# Patient Record
Sex: Female | Born: 1970 | Race: Black or African American | Hispanic: No | Marital: Single | Smoking: Current some day smoker
Health system: Southern US, Community
[De-identification: ages and names within clinical notes are randomized; demographics above are authoritative.]

## PROBLEM LIST (undated history)

## (undated) DIAGNOSIS — I10 Essential (primary) hypertension: Secondary | ICD-10-CM

## (undated) DIAGNOSIS — E785 Hyperlipidemia, unspecified: Secondary | ICD-10-CM

## (undated) HISTORY — DX: Hyperlipidemia, unspecified: E78.5

---

## 1997-12-13 ENCOUNTER — Inpatient Hospital Stay (HOSPITAL_COMMUNITY): Admission: AD | Admit: 1997-12-13 | Discharge: 1997-12-16 | Payer: Self-pay | Admitting: Obstetrics

## 1998-02-03 ENCOUNTER — Observation Stay (HOSPITAL_COMMUNITY): Admission: AD | Admit: 1998-02-03 | Discharge: 1998-02-03 | Payer: Self-pay | Admitting: *Deleted

## 1998-02-06 ENCOUNTER — Inpatient Hospital Stay (HOSPITAL_COMMUNITY): Admission: AD | Admit: 1998-02-06 | Discharge: 1998-02-08 | Payer: Self-pay | Admitting: Obstetrics

## 1998-03-25 ENCOUNTER — Encounter: Admission: RE | Admit: 1998-03-25 | Discharge: 1998-03-25 | Payer: Self-pay | Admitting: Obstetrics & Gynecology

## 1998-04-04 ENCOUNTER — Ambulatory Visit (HOSPITAL_COMMUNITY): Admission: RE | Admit: 1998-04-04 | Discharge: 1998-04-04 | Payer: Self-pay | Admitting: Obstetrics & Gynecology

## 1998-04-25 ENCOUNTER — Ambulatory Visit (HOSPITAL_COMMUNITY): Admission: RE | Admit: 1998-04-25 | Discharge: 1998-04-25 | Payer: Self-pay | Admitting: Obstetrics & Gynecology

## 1998-11-18 ENCOUNTER — Emergency Department (HOSPITAL_COMMUNITY): Admission: EM | Admit: 1998-11-18 | Discharge: 1998-11-18 | Payer: Self-pay | Admitting: Emergency Medicine

## 1998-12-21 ENCOUNTER — Emergency Department (HOSPITAL_COMMUNITY): Admission: EM | Admit: 1998-12-21 | Discharge: 1998-12-21 | Payer: Self-pay | Admitting: Emergency Medicine

## 1998-12-21 ENCOUNTER — Encounter: Payer: Self-pay | Admitting: Emergency Medicine

## 2003-01-11 ENCOUNTER — Emergency Department (HOSPITAL_COMMUNITY): Admission: EM | Admit: 2003-01-11 | Discharge: 2003-01-11 | Payer: Self-pay | Admitting: Emergency Medicine

## 2003-02-03 ENCOUNTER — Inpatient Hospital Stay (HOSPITAL_COMMUNITY): Admission: EM | Admit: 2003-02-03 | Discharge: 2003-02-05 | Payer: Self-pay

## 2004-04-19 ENCOUNTER — Emergency Department (HOSPITAL_COMMUNITY): Admission: EM | Admit: 2004-04-19 | Discharge: 2004-04-19 | Payer: Self-pay | Admitting: Emergency Medicine

## 2004-06-17 ENCOUNTER — Emergency Department (HOSPITAL_COMMUNITY): Admission: EM | Admit: 2004-06-17 | Discharge: 2004-06-17 | Payer: Self-pay | Admitting: *Deleted

## 2012-08-25 ENCOUNTER — Emergency Department (HOSPITAL_COMMUNITY)
Admission: EM | Admit: 2012-08-25 | Discharge: 2012-08-25 | Disposition: A | Discharge status: Home / Self Care | Payer: Self-pay | Attending: Emergency Medicine | Admitting: Emergency Medicine

## 2012-08-25 ENCOUNTER — Encounter (HOSPITAL_COMMUNITY): Payer: Self-pay | Admitting: Nurse Practitioner

## 2012-08-25 DIAGNOSIS — M545 Low back pain, unspecified: Secondary | ICD-10-CM | POA: Insufficient documentation

## 2012-08-25 DIAGNOSIS — F17200 Nicotine dependence, unspecified, uncomplicated: Secondary | ICD-10-CM | POA: Insufficient documentation

## 2012-08-25 MED ORDER — IBUPROFEN 600 MG PO TABS: 600.0000 mg | ORAL_TABLET | Freq: Four times a day (QID) | ORAL | Status: DC | PRN

## 2012-08-25 MED ORDER — OXYCODONE-ACETAMINOPHEN 7.5-500 MG PO TABS: 1.0000 | ORAL_TABLET | ORAL | Status: DC | PRN

## 2012-08-25 MED ORDER — CYCLOBENZAPRINE HCL 10 MG PO TABS: 10.0000 mg | ORAL_TABLET | Freq: Two times a day (BID) | ORAL | Status: DC | PRN

## 2012-08-25 NOTE — ED Provider Notes (Signed)
History   This chart was scribed for Nelia Shi, MD, by Frederik Pear, ER scribe. The patient was seen in room TR07C/TR07C and the patient's care was started at 0854.    CSN: [id number]  Arrival date & time 08/25/12  3086   First MD Initiated Contact with Patient 08/25/12 [phone]      Chief Complaint  Patient presents with  . Back Pain    (Consider location/radiation/quality/duration/timing/severity/associated sxs/prior treatment) HPI  Roberta Calderon is a 41 y.o. female who presents to the Emergency Department complaining of constant, moderate lower back pain that is aggravated by movement and began several days ago. She denies any dysuria. She reports that she took ibuprofen at home with no relief. She states that she was in the ED over the summer with similar pain and was diagnosed with a pinched nerve.   No past medical history on file.  No past surgical history on file.  No family history on file.  History  Substance Use Topics  . Smoking status: Current Every Day Smoker  . Smokeless tobacco: Not on file  . Alcohol Use: No    OB History    Grav Para Term Preterm Abortions TAB SAB Ect Mult Living                  Review of Systems A complete 10 system review of systems was obtained and all systems are negative except as noted in the HPI and PMH.  Allergies  Review of patient's allergies indicates no known allergies.  Home Medications   Current Outpatient Rx  Name  Route  Sig  Dispense  Refill  . CYCLOBENZAPRINE HCL 10 MG PO TABS   Oral   Take 1 tablet (10 mg total) by mouth 2 (two) times daily as needed for muscle spasms.   20 tablet   0   . IBUPROFEN 600 MG PO TABS   Oral   Take 1 tablet (600 mg total) by mouth every 6 (six) hours as needed for pain.   30 tablet   0   . OXYCODONE-ACETAMINOPHEN 7.5-500 MG PO TABS   Oral   Take 1 tablet by mouth every 4 (four) hours as needed for pain.   30 tablet   0     BP 179/94  Pulse 75  Temp 97.8 F  (36.6 C) (Oral)  Resp 18  SpO2 100%  Physical Exam  Nursing note and vitals reviewed. Constitutional: She is oriented to person, place, and time. She appears well-developed and well-nourished. No distress.  HENT:  Head: Normocephalic and atraumatic.  Eyes: Pupils are equal, round, and reactive to light.  Neck: Normal range of motion.  Cardiovascular: Normal rate and intact distal pulses.   Pulmonary/Chest: No respiratory distress.  Abdominal: Normal appearance. She exhibits no distension.  Musculoskeletal: She exhibits tenderness (As indicated).       Lumbar back: She exhibits decreased range of motion.       Back:  Neurological: She is alert and oriented to person, place, and time. No cranial nerve deficit.  Skin: Skin is warm and dry. No rash noted.  Psychiatric: She has a normal mood and affect. Her behavior is normal.    ED Course  Procedures (including critical care time)  DIAGNOSTIC STUDIES: Oxygen Saturation is 100% on room air, normal by my interpretation.    COORDINATION OF CARE:  08:55- Discussed planned course of treatment with the patient, including pain medication, who is agreeable at this time.  Labs Reviewed - No data to display No results found.   1. Low back pain       MDM  I personally performed the services described in this documentation, which was scribed in my presence. The recorded information has been reviewed and considered.       Nelia Shi, MD 08/25/12 [phone]

## 2012-08-25 NOTE — ED Notes (Signed)
C/o lower back pain for past several days. Reports a history of back pain. States 'Feels like a pinched nerve." no new injuries. Ambulatory, mae

## 2012-08-25 NOTE — ED Notes (Signed)
Low mid back pain, non-radiating, denies numbness. Pain comes and goes.Hx of similar pain in the past. Pain worse with movement.

## 2012-10-08 ENCOUNTER — Emergency Department (HOSPITAL_COMMUNITY)
Admission: EM | Admit: 2012-10-08 | Discharge: 2012-10-08 | Disposition: A | Discharge status: Home / Self Care | Payer: Self-pay | Attending: Emergency Medicine | Admitting: Emergency Medicine

## 2012-10-08 ENCOUNTER — Encounter (HOSPITAL_COMMUNITY): Payer: Self-pay | Admitting: *Deleted

## 2012-10-08 DIAGNOSIS — F17200 Nicotine dependence, unspecified, uncomplicated: Secondary | ICD-10-CM | POA: Insufficient documentation

## 2012-10-08 DIAGNOSIS — IMO0002 Reserved for concepts with insufficient information to code with codable children: Secondary | ICD-10-CM | POA: Insufficient documentation

## 2012-10-08 DIAGNOSIS — M62838 Other muscle spasm: Secondary | ICD-10-CM | POA: Insufficient documentation

## 2012-10-08 DIAGNOSIS — M542 Cervicalgia: Secondary | ICD-10-CM | POA: Insufficient documentation

## 2012-10-08 DIAGNOSIS — F911 Conduct disorder, childhood-onset type: Secondary | ICD-10-CM | POA: Insufficient documentation

## 2012-10-08 DIAGNOSIS — Z87828 Personal history of other (healed) physical injury and trauma: Secondary | ICD-10-CM | POA: Insufficient documentation

## 2012-10-08 MED ORDER — IBUPROFEN 800 MG PO TABS: 800.0000 mg | ORAL_TABLET | Freq: Three times a day (TID) | ORAL | Status: DC

## 2012-10-08 MED ORDER — CYCLOBENZAPRINE HCL 10 MG PO TABS
10.0000 mg | ORAL_TABLET | Freq: Once | ORAL | Status: AC
  Administered 2012-10-08: 10 mg via ORAL
  Filled 2012-10-08: qty 1

## 2012-10-08 MED ORDER — CYCLOBENZAPRINE HCL 10 MG PO TABS: 10.0000 mg | ORAL_TABLET | Freq: Two times a day (BID) | ORAL | Status: DC | PRN

## 2012-10-08 NOTE — ED Notes (Signed)
Pt refused to sign discharge paper work and refused prescriptions

## 2012-10-08 NOTE — ED Provider Notes (Signed)
History     CSN: [id number]  Arrival date & time 10/08/12  1216   First MD Initiated Contact with Patient 10/08/12 1229      Chief Complaint  Patient presents with  . Neck Pain    (Consider location/radiation/quality/duration/timing/severity/associated sxs/prior treatment) HPI Comments: 42 year old female presents emergency department complaining of left-sided neck pain and pain on her scapular region beginning last night worsening into today. States she had similar symptoms one year ago after staining a fall, however this time she denies any new injury. Last year she was given pain medication which greatly improved her pain. No new injury or trauma. Describes pain as nonradiating, sharp and throbbing , rated 10 out of 10. Pain worse with turning her neck, relieved by laying down. Tylenol provides no relief. Denies pain, numbness or tingling shooting down her extremities. No fever or chills. Initially when asking patient his questions, she refused to answer because "too many people are asking her the same questions". I explained to her that I am the one providing her care today, and is general practice for each person to ask questions that they have.  Patient is a 42 y.o. female presenting with neck pain. The history is provided by the patient.  Neck Pain Associated symptoms: no fever and no numbness     History reviewed. No pertinent past medical history.  History reviewed. No pertinent past surgical history.  No family history on file.  History  Substance Use Topics  . Smoking status: Current Every Day Smoker -- 0.25 packs/day    Types: Cigarettes  . Smokeless tobacco: Not on file  . Alcohol Use: No    OB History   Grav Para Term Preterm Abortions TAB SAB Ect Mult Living                  Review of Systems  Constitutional: Negative for fever and chills.  HENT: Positive for neck pain. Negative for neck stiffness.   Neurological: Negative for numbness.  All other systems  reviewed and are negative.    Allergies  Review of patient's allergies indicates no known allergies.  Home Medications  No current outpatient prescriptions on file.  BP 142/91  Pulse 110  Temp(Src) 98.1 F (36.7 C) (Oral)  Resp 18  SpO2 100%  LMP 09/21/2012  Physical Exam  Nursing note and vitals reviewed. Constitutional: She is oriented to person, place, and time. She appears well-developed and well-nourished. No distress.  HENT:  Head: Normocephalic and atraumatic.  Mouth/Throat: Oropharynx is clear and moist.  Eyes: Conjunctivae and EOM are normal. Pupils are equal, round, and reactive to light.  Neck: Trachea normal. Neck supple. Spinous process tenderness and muscular tenderness present. No rigidity. No edema and no erythema present. No Brudzinski's sign and no Kernig's sign noted.  Patient refuses to perform ROM due to pain.  Cardiovascular: Normal rate, regular rhythm, normal heart sounds and intact distal pulses.   Pulmonary/Chest: Effort normal and breath sounds normal.  Musculoskeletal: She exhibits no edema.       Arms: Tenderness to the lightest palpation of the posterior aspects of her shoulder and generalized the left side of her neck. Patient winces before even palpating and pushes arm away immediately.  Neurological: She is alert and oriented to person, place, and time. No sensory deficit.  Patient refuses to allow me to assess her strength  Skin: Skin is warm and dry.  Psychiatric: Her affect is angry. She is agitated.    ED Course  Procedures (  including critical care time)  Labs Reviewed - No data to display No results found.   1. Neck pain   2. Muscle spasm       MDM  42 year old female with neck pain. She is very agitated. She is very uncooperative on physical exam. When asking him Flexeril been any change, she shook her head no I move her neck without any problem. I then mentioned that she moved her neck and suddenly she stopped. I will not  prescribe her any narcotic pain medication at this time. I advised her to take ibuprofen and Flexeril. Conservative measures discussed. Return precautions discussed.        Trevor Mace, PA-C 10/08/12 1350

## 2012-10-08 NOTE — ED Notes (Signed)
Pt to ED for constant L neck and back pain from her scapula to the base of her skull.  Pain decreases when she lays down and increases when she turns her neck.  PT was tx for same s/s 1 year ago (other side) and was given pain meds with improvement.

## 2012-10-09 NOTE — ED Provider Notes (Signed)
Medical screening examination/treatment/procedure(s) were performed by non-physician practitioner and as supervising physician I was immediately available for consultation/collaboration.   Joya Gaskins, MD 10/09/12 [phone]

## 2013-11-11 ENCOUNTER — Encounter (HOSPITAL_COMMUNITY): Payer: Self-pay | Admitting: Emergency Medicine

## 2013-11-11 ENCOUNTER — Emergency Department (HOSPITAL_COMMUNITY)
Admission: EM | Admit: 2013-11-11 | Discharge: 2013-11-11 | Disposition: A | Discharge status: Home / Self Care | Payer: Self-pay | Attending: Emergency Medicine | Admitting: Emergency Medicine

## 2013-11-11 DIAGNOSIS — H5789 Other specified disorders of eye and adnexa: Secondary | ICD-10-CM | POA: Insufficient documentation

## 2013-11-11 DIAGNOSIS — H5710 Ocular pain, unspecified eye: Secondary | ICD-10-CM | POA: Insufficient documentation

## 2013-11-11 DIAGNOSIS — M545 Low back pain, unspecified: Secondary | ICD-10-CM | POA: Insufficient documentation

## 2013-11-11 DIAGNOSIS — M549 Dorsalgia, unspecified: Secondary | ICD-10-CM

## 2013-11-11 DIAGNOSIS — F17200 Nicotine dependence, unspecified, uncomplicated: Secondary | ICD-10-CM | POA: Insufficient documentation

## 2013-11-11 DIAGNOSIS — H53149 Visual discomfort, unspecified: Secondary | ICD-10-CM | POA: Insufficient documentation

## 2013-11-11 DIAGNOSIS — Z8673 Personal history of transient ischemic attack (TIA), and cerebral infarction without residual deficits: Secondary | ICD-10-CM | POA: Insufficient documentation

## 2013-11-11 MED ORDER — TOBRAMYCIN 0.3 % OP SOLN
2.0000 [drp] | OPHTHALMIC | Status: DC
  Administered 2013-11-11: 2 [drp] via OPHTHALMIC
  Filled 2013-11-11: qty 5

## 2013-11-11 MED ORDER — ATROPINE SULFATE 1 % OP SOLN
1.0000 [drp] | Freq: Once | OPHTHALMIC | Status: AC
  Administered 2013-11-11: 1 [drp] via OPHTHALMIC
  Filled 2013-11-11: qty 2

## 2013-11-11 MED ORDER — FLUORESCEIN SODIUM 1 MG OP STRP
1.0000 | ORAL_STRIP | Freq: Once | OPHTHALMIC | Status: AC
  Administered 2013-11-11: 1 via OPHTHALMIC
  Filled 2013-11-11: qty 1

## 2013-11-11 MED ORDER — TETRACAINE HCL 0.5 % OP SOLN
2.0000 [drp] | Freq: Once | OPHTHALMIC | Status: AC
  Administered 2013-11-11: 2 [drp] via OPHTHALMIC
  Filled 2013-11-11: qty 2

## 2013-11-11 MED ORDER — OXYCODONE-ACETAMINOPHEN 5-325 MG PO TABS
1.0000 | ORAL_TABLET | Freq: Once | ORAL | Status: AC
Start: 2013-11-11 — End: 2013-11-11
  Administered 2013-11-11: 1 via ORAL
  Filled 2013-11-11: qty 1

## 2013-11-11 MED ORDER — OXYCODONE-ACETAMINOPHEN 5-325 MG PO TABS: 1.0000 | ORAL_TABLET | ORAL | Status: DC | PRN

## 2013-11-11 NOTE — ED Notes (Addendum)
Pt sats 95 on RA after switching finger.

## 2013-11-11 NOTE — Discharge Instructions (Signed)
Photophobia Photophobia is an extreme sensitivity to light. Sunlight or light from light bulbs can make your eyes hurt. You might have to squint or cover your eyes, even in dim light. It also can take a long time for your eyes to adjust from low light to bright light. Photophobia is not a disease. It is a symptom or sign of another problem. It can happen in one or both eyes. CAUSES  Causes that are related directly to the eyes:  Irritation from contact lenses.  A foreign body on the surface of the eye.  Any injury or irritation to cornea.  An eye injury.  Excessive dryness of the eyes.  An eye infection.  Redness, soreness, and swelling (inflammation) internal to the eye (iritis or uveitis).  Certain eye diseases or disorders that cause light to be splayed out or cause glare (certain types of cataracts).  Swelling of the cornea.  Very high pressure inside the eyes. Other causes that are not related directly to the eyes:  Migraine headaches.  An infection of the fluid and membranes that cover the brain and spinal cord (meningitis).  A head injury.  Brain tumors.  Certain eyedrops, drugs, or other medications (some antibiotics, atropine, scopolamine, amphetamines, and cocaine). DIAGNOSIS  An examination by an eye doctor (ophthalmologist) will be needed. If no cause of photophobia is found after a complete ophthalmological examination, you may need a full medical evaluation. To decide what is causing your photophobia:  A medical history may be taken.  A physical exam may be performed. This may include:  Blood tests.  Using special machines to take images or pictures of the skull and brain (imaging scans).  A test that checks the fluid around the brain and spinal cord (lumbar puncture). TREATMENT  Treatment depends on the cause of photophobia. Any diseases or disorders of the eyes causing photophobia will be treated by an ophthalmologist. If the cause of photophobia is due  to a medical disorder or medication, treatment will be targeted at the underlying problem. Treatment may include:  Using eyedrops to soothe and moisten the eyes (if the cause of photophobia is dryness).  Staying away from flickering lights. This is especially true for people who get migraines.  Taking medication as directed to ease headaches or neck pain. You may get relief of light sensitivity while being treated if you:  Avoid bright lights.  Protect your eyes from the light. You could wear an eye patch, sunglasses, or a hat with a wide brim when you are outside. SEEK MEDICAL CARE IF:   You experience pain in one or both eyes in addition to light sensitivity.  Your eyes become dry or itchy.  Your eyes start to hurt.  You notice discharge from your eyes.  Your eyes become red.  Your vision becomes blurry.  It hurts to wear contact lenses.  You continue to have a headache or neck pain with photophobia.  You have an oral temperature above 102 F (38.9 C). Document Released: 01/31/2010 Document Revised: 11/05/2011 Document Reviewed: 01/31/2010 [facility] Patient Information 2014 [place]. Back Pain, Adult Low back pain is very common. About 1 in 5 people have back pain.The cause of low back pain is rarely dangerous. The pain often gets better over time.About half of people with a sudden onset of back pain feel better in just 2 weeks. About 8 in 10 people feel better by 6 weeks.  CAUSES Some common causes of back pain include:  Strain of the muscles  or ligaments supporting the spine.  Wear and tear (degeneration) of the spinal discs.  Arthritis.  Direct injury to the back. DIAGNOSIS Most of the time, the direct cause of low back pain is not known.However, back pain can be treated effectively even when the exact cause of the pain is unknown.Answering your caregiver's questions about your overall health and symptoms is one of the most accurate ways to make sure the  cause of your pain is not dangerous. If your caregiver needs more information, he or she may order lab work or imaging tests (X-rays or MRIs).However, even if imaging tests show changes in your back, this usually does not require surgery. HOME CARE INSTRUCTIONS For many people, back pain returns.Since low back pain is rarely dangerous, it is often a condition that people can learn to [facility] their own.   Remain active. It is stressful on the back to sit or stand in one place. Do not sit, drive, or stand in one place for more than 30 minutes at a time. Take short walks on level surfaces as soon as pain allows.Try to increase the length of time you walk each day.  Do not stay in bed.Resting more than 1 or 2 days can delay your recovery.  Do not avoid exercise or work.Your body is made to move.It is not dangerous to be active, even though your back may hurt.Your back will likely heal faster if you return to being active before your pain is gone.  Pay attention to your body when you bend and lift. Many people have less discomfortwhen lifting if they bend their knees, keep the load close to their bodies,and avoid twisting. Often, the most comfortable positions are those that put less stress on your recovering back.  Find a comfortable position to sleep. Use a firm mattress and lie on your side with your knees slightly bent. If you lie on your back, put a pillow under your knees.  Only take over-the-counter or prescription medicines as directed by your caregiver. Over-the-counter medicines to reduce pain and inflammation are often the most helpful.Your caregiver may prescribe muscle relaxant drugs.These medicines help dull your pain so you can more quickly return to your normal activities and healthy exercise.  Put ice on the injured area.  Put ice in a plastic bag.  Place a towel between your skin and the bag.  Leave the ice on for 15-20 minutes, 03-04 times a day for the first 2 to 3  days. After that, ice and heat may be alternated to reduce pain and spasms.  Ask your caregiver about trying back exercises and gentle massage. This may be of some benefit.  Avoid feeling anxious or stressed.Stress increases muscle tension and can worsen back pain.It is important to recognize when you are anxious or stressed and learn ways to manage it.Exercise is a great option. SEEK MEDICAL CARE IF:  You have pain that is not relieved with rest or medicine.  You have pain that does not improve in 1 week.  You have new symptoms.  You are generally not feeling well. SEEK IMMEDIATE MEDICAL CARE IF:   You have pain that radiates from your back into your legs.  You develop new bowel or bladder control problems.  You have unusual weakness or numbness in your arms or legs.  You develop nausea or vomiting.  You develop abdominal pain.  You feel faint. Document Released: 08/13/2005 Document Revised: 02/12/2012 Document Reviewed: 01/01/2011 [facility] Patient Information 2014 [place], [place].

## 2013-11-11 NOTE — ED Provider Notes (Signed)
CSN: [id number]     Arrival date & time 11/11/13  1001 History  This chart was scribed for non-physician practitioner Charlann Lange PA-C working with Juanda Crumble B. Karle Starch, MD by Rolanda Lundborg, ED Scribe. This patient was seen in room TR04C/TR04C and the patient's care was started at 10:40 AM.    Chief Complaint  Patient presents with  . Eye Pain   Patient is a 43 y.o. female presenting with back pain. The history is provided by the patient. No language interpreter was used.  Back Pain Location:  Lumbar spine Associated symptoms: no dysuria   Associated symptoms comment:  Pain in lower back, worse with movement, and similar to previous episodes low back pain.  HPI Comments: Roberta Calderon is a 43 y.o. female who presents to the Emergency Department complaining of sudden-onset, constant left eye pain since yesterday. States she thinks something got into her eye. She reports swelling since this morning and now the eye is swollen shut. She reports drainage from the eye last night but not currently. She reports photophobia.    History reviewed. No pertinent past medical history. History reviewed. No pertinent past surgical history. History reviewed. No pertinent family history. History  Substance Use Topics  . Smoking status: Current Every Day Smoker -- 0.25 packs/day    Types: Cigarettes  . Smokeless tobacco: Not on file  . Alcohol Use: No   OB History   Grav Para Term Preterm Abortions TAB SAB Ect Mult Living                 Review of Systems  Eyes: Positive for photophobia, pain, discharge and redness.  Gastrointestinal: Negative for nausea.  Genitourinary: Negative for dysuria.  Musculoskeletal: Positive for back pain.  All other systems reviewed and are negative.      Allergies  Review of patient's allergies indicates no known allergies.  Home Medications   Current Outpatient Rx  Name  Route  Sig  Dispense  Refill  . cyclobenzaprine (FLEXERIL) 10 MG tablet    Oral   Take 1 tablet (10 mg total) by mouth 2 (two) times daily as needed for muscle spasms.   20 tablet   0   . ibuprofen (ADVIL,MOTRIN) 800 MG tablet   Oral   Take 1 tablet (800 mg total) by mouth 3 (three) times daily.   21 tablet   0    BP 156/92  Pulse 85  Temp(Src) 98.3 F (36.8 C)  Resp 18  Wt 139 lb (63.05 kg)  SpO2 95%  LMP 11/09/2013 Physical Exam  Nursing note and vitals reviewed. Constitutional: She is oriented to person, place, and time. She appears well-developed and well-nourished. No distress.  HENT:  Head: Normocephalic and atraumatic.  Eyes: EOM are normal.  Significant pain on direct and consensual accomodation. Exam limited by pain.  Neck: Neck supple. No tracheal deviation present.  Cardiovascular: Normal rate.   Pulmonary/Chest: Effort normal. No respiratory distress.  Musculoskeletal: Normal range of motion.  Mild left paralumbar tenderness without swelling or discoloration. FROM LE's.  Neurological: She is alert and oriented to person, place, and time.  Skin: Skin is warm and dry.  Psychiatric: She has a normal mood and affect. Her behavior is normal.    ED Course  Procedures (including critical care time) Medications - No data to display  DIAGNOSTIC STUDIES: Oxygen Saturation is 95% on RA, normal by my interpretation.    COORDINATION OF CARE: 10:56 AM- Discussed treatment plan with pt. Pt agrees to  plan.    Labs Review Labs Reviewed - No data to display Imaging Review No results found.   EKG Interpretation None      MDM   Final diagnoses:  None    1. Eye pain 2. Back pain, recurrent  No FB on exam limited by patient stating she was unable to cooperate with exam secondary to pain. Sclera red, eye tearing but without exudate. Flurecscein stain limited but no obvious abrasion. Will cover with abx eye drops and encourage ophthalmologic follow up. Back pain appears by exam and history to be mild musculoskeletal pain without  neurologic red flags.   I personally performed the services described in this documentation, which was scribed in my presence. The recorded information has been reviewed and is accurate.    Dewaine Oats, PA-C 11/27/13 1010

## 2013-11-11 NOTE — ED Notes (Signed)
Per pt sts yesterday something go in her eye and now painful and swollen.

## 2013-11-11 NOTE — ED Notes (Signed)
Patient refused visual acuity.   States "I said my eye wont open".  I asked if she could manually open lids, she states "NO".

## 2013-11-28 NOTE — ED Provider Notes (Signed)
Medical screening examination/treatment/procedure(s) were performed by non-physician practitioner and as supervising physician I was immediately available for consultation/collaboration.   EKG Interpretation None        Charles B. Karle Starch, MD 11/28/13 [phone]

## 2014-01-26 ENCOUNTER — Encounter (HOSPITAL_COMMUNITY): Payer: Self-pay | Admitting: Emergency Medicine

## 2014-01-26 ENCOUNTER — Emergency Department (HOSPITAL_COMMUNITY)
Admission: EM | Admit: 2014-01-26 | Discharge: 2014-01-26 | Disposition: A | Discharge status: Home / Self Care | Payer: Self-pay | Attending: Emergency Medicine | Admitting: Emergency Medicine

## 2014-01-26 DIAGNOSIS — I10 Essential (primary) hypertension: Secondary | ICD-10-CM | POA: Insufficient documentation

## 2014-01-26 DIAGNOSIS — H109 Unspecified conjunctivitis: Secondary | ICD-10-CM | POA: Insufficient documentation

## 2014-01-26 DIAGNOSIS — H53141 Visual discomfort, right eye: Secondary | ICD-10-CM

## 2014-01-26 DIAGNOSIS — F17200 Nicotine dependence, unspecified, uncomplicated: Secondary | ICD-10-CM | POA: Insufficient documentation

## 2014-01-26 HISTORY — DX: Essential (primary) hypertension: I10

## 2014-01-26 MED ORDER — TETRACAINE HCL 0.5 % OP SOLN
1.0000 [drp] | Freq: Once | OPHTHALMIC | Status: AC
  Administered 2014-01-26: 1 [drp] via OPHTHALMIC
  Filled 2014-01-26: qty 2

## 2014-01-26 MED ORDER — KETOROLAC TROMETHAMINE 0.5 % OP SOLN: 1.0000 [drp] | Freq: Four times a day (QID) | OPHTHALMIC | Status: DC

## 2014-01-26 MED ORDER — TOBRAMYCIN 0.3 % OP SOLN: 1.0000 [drp] | OPHTHALMIC | Status: DC

## 2014-01-26 MED ORDER — FLUORESCEIN SODIUM 1 MG OP STRP
1.0000 | ORAL_STRIP | Freq: Once | OPHTHALMIC | Status: AC
  Administered 2014-01-26: 1 via OPHTHALMIC
  Filled 2014-01-26: qty 1

## 2014-01-26 NOTE — Progress Notes (Signed)
01/26/2014 1646 W. Stann Mainland RN BSN NCM  ED CM found medication discount card reducing cost to $4 at [employer]. Pt notified that she is agreeable will fax copy of card to [facility] on [address]. MATCH voided . No further CM needs identified.    ED CM received call from patient about medication assistance. Pt was seen today at [facility] ED and discharged with prescriptions  forr an opthalmic antibiotic and pain med. Pt states, she is not able to afford them. Reports prescription being at [facility]. Pt states she does not have insurance. Discussed the [facility] program with patient. Pt is eligible for [facility] program. Pt is agreeable with plan. Pt enrolled in program. [facility] letter printed. Pt requested [place] letter be faxed to [facility] on [address].

## 2014-01-26 NOTE — ED Provider Notes (Signed)
Medical screening examination/treatment/procedure(s) were performed by non-physician practitioner and as supervising physician I was immediately available for consultation/collaboration.   EKG Interpretation None       Orlie Dakin, MD 01/26/14 1551

## 2014-01-26 NOTE — Discharge Planning (Signed)
Roberta Calderon  Patient state she needs help with the medications she was prescribed. Patient requesting she leaves with the medicine. Patient was discharged at the time so PA could not request the meds from the pharmacy. Pts information given to [place] to Match patient. Resource guide and my contact information provided for any future questions or concerns.

## 2014-01-26 NOTE — ED Provider Notes (Signed)
CSN: [id number]     Arrival date & time 01/26/14  1159 History   First MD Initiated Contact with Patient 01/26/14 1212     Chief Complaint  Patient presents with  . Eye Pain  . Eye Drainage     (Consider location/radiation/quality/duration/timing/severity/associated sxs/prior Treatment) The history is provided by the patient and medical records.   This is a 43 year old female with past medical history significant for hypertension, presenting to the ED for right eye irritation, drainage, and photophobia onset this morning. She states she had the same symptoms in her left eye 2 months ago, was seen in the ED had eyes irrigated and was started on antibiotic drops with complete resolution of her symptoms. She denies any trauma, chemical, or foreign body exposure to her eye. Denies any fevers or chills. She does not wear glasses or contact lenses.  No known sick contacts. Patient is not currently established with an eye doctor.  Past Medical History  Diagnosis Date  . Hypertension    History reviewed. No pertinent past surgical history. History reviewed. No pertinent family history. History  Substance Use Topics  . Smoking status: Current Every Day Smoker -- 0.25 packs/day    Types: Cigarettes  . Smokeless tobacco: Not on file  . Alcohol Use: No   OB History   Grav Para Term Preterm Abortions TAB SAB Ect Mult Living                 Review of Systems  Eyes: Positive for photophobia, pain, discharge and redness.  All other systems reviewed and are negative.     Allergies  Review of patient's allergies indicates no known allergies.  Home Medications   Prior to Admission medications   Medication Sig Start Date End Date Taking? Authorizing Provider  oxyCODONE-acetaminophen (PERCOCET/ROXICET) 5-325 MG per tablet Take 1-2 tablets by mouth every 4 (four) hours as needed for severe pain. 11/11/13   Shari A Upstill, PA-C   BP 173/107  Pulse 93  Temp(Src) 99 F (37.2 C) (Oral)   Resp 18  SpO2 99%  Physical Exam  Nursing note and vitals reviewed. Constitutional: She is oriented to person, place, and time. She appears well-developed and well-nourished.  HENT:  Head: Normocephalic and atraumatic.  Mouth/Throat: Oropharynx is clear and moist.  Eyes: EOM are normal. Pupils are equal, round, and reactive to light. Right eye exhibits discharge. Right conjunctiva is injected.  Slit lamp exam:      The right eye shows no corneal abrasion, no corneal flare, no corneal ulcer, no foreign body and no fluorescein uptake.  Right eye with copious drainage surrounding upper and lower lid margins; no noted eyelid edema or erythema; right conjunctiva injected; no FB; negative fluorescein uptake of right eye; no evidence of corneal abrasion or corneal ulcer  Neck: Normal range of motion. Neck supple.  Cardiovascular: Normal rate, regular rhythm and normal heart sounds.   Pulmonary/Chest: Effort normal and breath sounds normal.  Musculoskeletal: Normal range of motion.  Neurological: She is alert and oriented to person, place, and time.  Skin: Skin is warm and dry.  Psychiatric: She has a normal mood and affect.    ED Course  Procedures (including critical care time) Labs Review Labs Reviewed - No data to display  Imaging Review No results found.   EKG Interpretation None      MDM   Final diagnoses:  Conjunctivitis of right eye  Photophobia of right eye   43 year old female with copious purulent drainage surrounding  her upper and lower lid margins. There is no lid edema or erythema to suggest or preseptal cellulitis. Fluorescein stain difficult to perform as pt is somewhat uncooperative however i see no evidence of corneal ulcer or corneal abrasion. There is no fluorescein uptake. Patient will be started on Acular and Tobrex eyedrops.  She will FU with opthalmology.  Discussed plan with patient, he/she acknowledged understanding and agreed with plan of care.  Return  precautions given for new or worsening symptoms.  After patient was discharged she came back requesting coke and coffee.  Pt also stated she could not afford full price of Rx but stated she could afford $3 copay if we helped her.  Attempted to help patient with Match program for her prescription needs however she left with discharge instructions prior to care management team member speaking with her.  I personally performed the services described in this documentation, which was scribed in my presence. The recorded information has been reviewed and is accurate.  Larene Pickett, PA-C 01/26/14 [phone]

## 2014-01-26 NOTE — Discharge Instructions (Signed)
Take the prescribed medication as directed. Follow-up with opthalamology if problems occur or if begin experiencing problems with your vision. Return to the ED for new or worsening symptoms.

## 2014-01-26 NOTE — ED Notes (Signed)
Pt states she woke yesterday am with right eye pain and drainage. States "I feel like something is in it".

## 2014-01-26 NOTE — ED Notes (Signed)
Pt states that she woke this morning with her rt eye swollen. States that she had the same proble with her left eye one month ago. States that they flushed her eye and gave her 2 medications. Pt denies injury.

## 2014-01-28 NOTE — Progress Notes (Signed)
ED CM received call from patient stating that she was able to get antibiotic eye drop from [facility], but could not afford $14.00 for Ketorolac eye drops and requesting MATCH assistance now. Spoke with this patient 6/2 and researched the prices of prescription patient then said she can manage the total price of approx. $20.00. Reviewed MATCH guidelines again. Reiterated MATCH eleigibility once in a calendar year and $3.00 copayment. Pt verbalized understanding and is agreeable. New MATCH letter printed and faxed to [place] at patient's request.  Pt made aware that [facility] letter was sent to Pharmacy. No further CM needs identified.

## 2014-01-29 NOTE — Progress Notes (Signed)
  CARE MANAGEMENT ED NOTE 01/29/2014  Patient:  SHALEA, TOMCZAK   Account Number:  [id number]  Date Initiated:  01/29/2014  Documentation initiated by:  Edwyna Shell  Subjective/Objective Assessment:   CM received phone call from patient stating that she cannot afford her discharge medications and the pharmacy has not received the letter.     Subjective/Objective Assessment Detail:   The history is provided by the patient and medical records.  This is a 43 year old female with past medical history significant for hypertension, presenting to the ED for right eye irritation, drainage, and photophobia onset this morning. She states she had the same symptoms in her left eye 2 months ago, was seen in the ED had eyes irrigated and was started on antibiotic drops with complete resolution of her symptoms. She denies any trauma, chemical, or foreign body exposure to her eye. Denies any fevers or chills. She does not wear glasses or contact lenses.  No known sick contacts. Patient is not currently established with an eye doctor.     Action/Plan:   Patient will purchase $4 antibiotic eye drops. Since she hung up on the CM will await return phone call from patient prior to reissuing and faxing another [facility] letter   Action/Plan Detail:   Anticipated DC Date:       Status Recommendation to Physician:   Result of Recommendation:      [place]  Medication Assistance    Choice offered to / List presented to:            Status of service:    ED Comments:   ED Comments Detail:  Late entry for 01/28/14 Edwyna Shell RN, NCM CM spoke with patient and she stated that on 01/26/14 she was treated in the ED and Prescribed eye drops and she cannot afford the eye drops and the CM she spoke with was supposed to fax a letter which would make the copay $3. Patient also stated that [place] does not even have the prescribed medications. This CM then called the [facility] at [facility] and  spoke with pharmacist and she confirmed that they did have the medications in stock. Spoke with the patient again and explained that [facility] does have the medications, explained that one would cost $4 and the other 100.00 but that a GoodRx coupon would reduce the cost to $17, so the total for the meds would be $21. The patient stated she could afford that amount. This CM faxed the coupon to [place] on [facility] and called and received confirmation that faxed coupon was received. This CM then received a phone call from the patient and she was very upset stating that was not the coupon she wanted, she was supposed to get the coupon to make the copay $3. This CM then looked up the [facility] program and explained to the patient that it looks like she was MATCHED on the 2nd and that my supervisor would have to activate a new [facility] letter. The patient became upset because she needed the meds. Explained that she could get the abt drops for $4 while this CM issued a new [name] letter. Patient hung up on this CM.

## 2014-03-24 ENCOUNTER — Encounter (HOSPITAL_COMMUNITY): Payer: Self-pay | Admitting: Emergency Medicine

## 2014-03-24 ENCOUNTER — Emergency Department (HOSPITAL_COMMUNITY)
Admission: EM | Admit: 2014-03-24 | Discharge: 2014-03-24 | Disposition: A | Discharge status: Home / Self Care | Payer: Self-pay | Attending: Emergency Medicine | Admitting: Emergency Medicine

## 2014-03-24 DIAGNOSIS — IMO0001 Reserved for inherently not codable concepts without codable children: Secondary | ICD-10-CM

## 2014-03-24 DIAGNOSIS — I10 Essential (primary) hypertension: Secondary | ICD-10-CM | POA: Insufficient documentation

## 2014-03-24 DIAGNOSIS — K029 Dental caries, unspecified: Secondary | ICD-10-CM | POA: Insufficient documentation

## 2014-03-24 DIAGNOSIS — R030 Elevated blood-pressure reading, without diagnosis of hypertension: Secondary | ICD-10-CM

## 2014-03-24 DIAGNOSIS — K089 Disorder of teeth and supporting structures, unspecified: Secondary | ICD-10-CM | POA: Insufficient documentation

## 2014-03-24 DIAGNOSIS — F17200 Nicotine dependence, unspecified, uncomplicated: Secondary | ICD-10-CM | POA: Insufficient documentation

## 2014-03-24 DIAGNOSIS — Z3202 Encounter for pregnancy test, result negative: Secondary | ICD-10-CM | POA: Insufficient documentation

## 2014-03-24 DIAGNOSIS — K0889 Other specified disorders of teeth and supporting structures: Secondary | ICD-10-CM

## 2014-03-24 LAB — POC URINE PREG, ED: PREG TEST UR: NEGATIVE

## 2014-03-24 MED ORDER — ONDANSETRON 4 MG PO TBDP
8.0000 mg | ORAL_TABLET | Freq: Once | ORAL | Status: AC
  Administered 2014-03-24: 8 mg via ORAL
  Filled 2014-03-24: qty 2

## 2014-03-24 MED ORDER — IBUPROFEN 800 MG PO TABS: 800.0000 mg | ORAL_TABLET | Freq: Three times a day (TID) | ORAL | Status: DC

## 2014-03-24 MED ORDER — PENICILLIN V POTASSIUM 500 MG PO TABS: 500.0000 mg | ORAL_TABLET | Freq: Three times a day (TID) | ORAL | Status: DC

## 2014-03-24 MED ORDER — TRAMADOL HCL 50 MG PO TABS: 50.0000 mg | ORAL_TABLET | Freq: Four times a day (QID) | ORAL | Status: DC | PRN

## 2014-03-24 MED ORDER — HYDROCODONE-ACETAMINOPHEN 5-325 MG PO TABS: 1.0000 | ORAL_TABLET | ORAL | Status: DC | PRN

## 2014-03-24 MED ORDER — OXYCODONE-ACETAMINOPHEN 5-325 MG PO TABS
1.0000 | ORAL_TABLET | Freq: Once | ORAL | Status: AC
  Administered 2014-03-24: 1 via ORAL
  Filled 2014-03-24: qty 1

## 2014-03-24 NOTE — ED Notes (Signed)
Upon discharge states that she is "allergic" to vicodin" When asked by this RN what in Vicodin she is allergic to pt states' I don't know I just know I can't have it" when asked what her reaction is pt states 'I don't know" ED PA made aware and prescription changed. Upon 2nd attempt to discharge pt states "I don't want this I want the big white pill I was given before" Pt states "You don't know I have an infection I want the big white pill you have gave me before' (percocet)" Pt went on to say she "do not want 3 different pills, I want the one you gave me before" Pt requested to speak to ED PA when this RN explained the PA was in another room the patient proceeded to get out of the chair and stand between the RN and the door blocking, the RN from leaving the room. This RN requested the patient sit back down so I may leave the room and the patient left the room. An attempt was made to call security. Pt was requested to come back to the room or leave at this time. Pt asked that the blood pressure cuff be removed from her arm which this RN did, she then took her prescriptions and left.

## 2014-03-24 NOTE — ED Provider Notes (Signed)
CSN: [id number]     Arrival date & time 03/24/14  1536 History  This chart was scribed forLauren Jerline Pain, PA-C working with Richarda Blade, MD by Randa Evens, ED Scribe. This patient was seen in room TR04C/TR04C and the patient's care was started at 4:19 PM.    Chief Complaint  Patient presents with  . Dental Pain   HPI Comments: Roberta Calderon is a 43 y.o. female who presents to the Emergency Department complaining of right sided dental pain onset 1 week prior. She states she hasn't taken anything for the pain prior to arrival. She states she has had a subjective fever. She states she has a history of a root canal on the right side. She states she currently doesn't have a dentist. Pt states her menstrual cycle is 2 weeks late. States she has received some pain medication in the ED that has provided some relief. LNMP June 15th.  The history is provided by the patient. No language interpreter was used.     Past Medical History  Diagnosis Date  . Hypertension    History reviewed. No pertinent past surgical history. No family history on file. History  Substance Use Topics  . Smoking status: Current Every Day Smoker -- 0.25 packs/day    Types: Cigarettes  . Smokeless tobacco: Not on file  . Alcohol Use: No   OB History   Grav Para Term Preterm Abortions TAB SAB Ect Mult Living                 Review of Systems  Constitutional: Negative for fever and chills.  HENT: Positive for dental problem and facial swelling.   Cardiovascular: Negative for chest pain.    Allergies  Review of patient's allergies indicates no known allergies.  Home Medications   Prior to Admission medications   Medication Sig Start Date End Date Taking? Authorizing Provider  ketorolac (ACULAR) 0.5 % ophthalmic solution Place 1 drop into the right eye every 6 (six) hours. 01/26/14   Larene Pickett, PA-C  oxyCODONE-acetaminophen (PERCOCET/ROXICET) 5-325 MG per tablet Take 1-2 tablets by mouth every 4  (four) hours as needed for severe pain. 11/11/13   Shari A Upstill, PA-C  tobramycin (TOBREX) 0.3 % ophthalmic solution Place 1 drop into the right eye every 4 (four) hours. Place one drop in the affected eye every 4 hours for 7 days 01/26/14   Larene Pickett, PA-C   Triage Vitals: BP 154/100  Pulse 100  Temp(Src) 98.8 F (37.1 C) (Oral)  Resp 17  Ht 5\' 2"  (1.575 m)  SpO2 99%  Physical Exam  Nursing note and vitals reviewed. Constitutional: She is oriented to person, place, and time. She appears well-developed and well-nourished.  Non-toxic appearance. She does not have a sickly appearance. She does not appear ill. No distress.  Pt is eating pizza when entering the room.  HENT:  Head: Normocephalic and atraumatic.  Mouth/Throat: Dental caries present.  Tenderness to palpation to the base of tooth # 32, no fluctuance or pointing noted No signs of peritonsillar or tonsillar abscess. No signs of gingival abscess. Oropharynx is clear and without exudates. Soft non-tender sublingual mucosa, no tongue elevation, no edema to sublingual space, normal voice. Airway patent.   Eyes: Conjunctivae and EOM are normal.  Neck: Normal range of motion. Neck supple.  Pulmonary/Chest: Effort normal. No respiratory distress.  Musculoskeletal: Normal range of motion.  Neurological: She is alert and oriented to person, place, and time.  Skin: Skin is  warm and dry. She is not diaphoretic.  Psychiatric: She has a normal mood and affect. Her behavior is normal.    ED Course  Procedures (including critical care time) DIAGNOSTIC STUDIES: Oxygen Saturation is 99% on RA, normal by my interpretation.    COORDINATION OF CARE:    Labs Review Labs Reviewed  POC URINE PREG, ED    Imaging Review No results found.   EKG Interpretation None      MDM   Final diagnoses:  Pain, dental  Elevated blood pressure   Patient with toothache. No obvious abscess. Questionable dental cavity vs partially impacted  wisdom tooth.  Exam unconcerning for Ludwig's angina or spread of infection.  Will treat with penicillin and pain medicine.  Urged patient to follow-up with dentist. Pt BP continues to be elevated, urged pt to follow up with PCP. Pt reports she is allergic to Vicodin, will prescribe Tramadol.  Meds given in ED:  Medications  oxyCODONE-acetaminophen (PERCOCET/ROXICET) 5-325 MG per tablet 1 tablet (1 tablet Oral Given 03/24/14 1605)  ondansetron (ZOFRAN-ODT) disintegrating tablet 8 mg (8 mg Oral Given 03/24/14 1605)    Discharge Medication List as of 03/24/2014  6:42 PM    START taking these medications   Details  traMADol (ULTRAM) 50 MG tablet Take 1 tablet (50 mg total) by mouth every 6 (six) hours as needed., Starting 03/24/2014, Until Discontinued, Print        I personally performed the services described in this documentation, which was scribed in my presence. The recorded information has been reviewed and is accurate.       Lorrine Kin, PA-C 03/25/14 [phone]

## 2014-03-24 NOTE — ED Notes (Signed)
The pt is c/o a toothache since yesterday she has a broken tooth

## 2014-03-24 NOTE — Discharge Instructions (Signed)
Call for a follow up appointment with a Family or Primary Care Provider for further evaluation of your elevated blood pressure. Return if Symptoms worsen.   Take medication as prescribed.   Use the resource guide listed below to help you find a dentist if you do not already have one to followup with. It is very important that you get evaluated by a dentist as soon as possible. Call tomorrow to schedule an appointment. Use your pain medication as prescribed and do not operate heavy machinery while on pain medication. Note that your pain medication contains acetaminophen (Tylenol) & its is not reccommended that you use additional acetaminophen (Tylenol) while taking this medication. Take your full course of antibiotics. Read the instructions below.  Eat a soft or liquid diet and rinse your mouth out after meals with warm water. You should see a dentist or return here at once if you have increased swelling, increased pain or uncontrolled bleeding from the site of your injury.   SEEK MEDICAL CARE IF:   You have increased pain not controlled with medicines.   You have swelling around your tooth, in your face or neck.   You have bleeding which starts, continues, or gets worse.   You have a fever >101  If you are unable to open your mouth  RESOURCE GUIDE  Dental Problems  Patients with Medicaid: [place] [address] [place] Phone:  [phone]                                                  Phone:  [phone]  If unable to pay or uninsured, contact:  [facility] or [facility]. to become qualified for the adult dental clinic.  Chronic Pain Problems Contact [facility]  [phone] Patients need to be referred by their primary care doctor.  Insufficient Money for Medicine Contact [employer]:  call "211" or [place]  [phone].  No Primary Care Doctor Call [facility]  [phone] Other agencies that provide inexpensive medical care    [place]  [phone]    [facility] Internal [facility]  [place]  [phone]    [facility]  [phone]    [facility]  [place]  [place]  [phone] [place]   [phone] (emergency services [phone])  Substance Abuse Resources [facility] Drug [facility]  [phone] [facility] [phone] [facility] [place] [phone] [place] [place] [phone] Residential & Outpatient Substance Abuse Program  [phone]  Abuse/Neglect [place] [phone] [place] [phone] (After Hours)  Emergency [place] [phone]  [place] at the [place] [phone] [place] [phone]  MRSA Hotline #:   938-372-8731    [facility]  [facility] Dept. [address]      [place] Phone:  [phone]                                   Phone:  [phone]                 Phone:  [place] Phone:  [place] [phone] (After Hours)

## 2014-03-25 NOTE — ED Provider Notes (Signed)
Medical screening examination/treatment/procedure(s) were performed by non-physician practitioner and as supervising physician I was immediately available for consultation/collaboration.  Richarda Blade, MD 03/25/14 [phone]

## 2014-04-03 ENCOUNTER — Emergency Department (HOSPITAL_COMMUNITY)
Admission: EM | Admit: 2014-04-03 | Discharge: 2014-04-03 | Disposition: A | Discharge status: Home / Self Care | Payer: Self-pay | Attending: Emergency Medicine | Admitting: Emergency Medicine

## 2014-04-03 ENCOUNTER — Encounter (HOSPITAL_COMMUNITY): Payer: Self-pay | Admitting: Emergency Medicine

## 2014-04-03 DIAGNOSIS — F17200 Nicotine dependence, unspecified, uncomplicated: Secondary | ICD-10-CM | POA: Insufficient documentation

## 2014-04-03 DIAGNOSIS — H5710 Ocular pain, unspecified eye: Secondary | ICD-10-CM | POA: Insufficient documentation

## 2014-04-03 DIAGNOSIS — I10 Essential (primary) hypertension: Secondary | ICD-10-CM | POA: Insufficient documentation

## 2014-04-03 DIAGNOSIS — H109 Unspecified conjunctivitis: Secondary | ICD-10-CM | POA: Insufficient documentation

## 2014-04-03 MED ORDER — FLUORESCEIN SODIUM 1 MG OP STRP
1.0000 | ORAL_STRIP | Freq: Once | OPHTHALMIC | Status: AC
  Administered 2014-04-03: 1 via OPHTHALMIC
  Filled 2014-04-03: qty 1

## 2014-04-03 MED ORDER — TETRACAINE HCL 0.5 % OP SOLN
1.0000 [drp] | Freq: Once | OPHTHALMIC | Status: AC
  Administered 2014-04-03: 1 [drp] via OPHTHALMIC
  Filled 2014-04-03: qty 2

## 2014-04-03 MED ORDER — POLYMYXIN B-TRIMETHOPRIM 10000-0.1 UNIT/ML-% OP SOLN
1.0000 [drp] | OPHTHALMIC | Status: DC
  Administered 2014-04-03: 1 [drp] via OPHTHALMIC
  Filled 2014-04-03: qty 10

## 2014-04-03 NOTE — ED Notes (Signed)
Pt c/o irritation to B/L eyes. Pt reports seen here for same in past.

## 2014-04-03 NOTE — ED Notes (Signed)
Eleveated BP" reported to PA [employer].

## 2014-04-03 NOTE — Progress Notes (Signed)
ED CM contacted by Laurence Aly in [place] concerning patient returning to ED for similar issue back in June. Patient presented to [facility] ED for problems with eyes. Reviewed record patient no PCP or Health insurance noted. Met with patient in [place]. ED CM assisted patient with eye medication in June 2015. [place] letter was faxed to [place]. Patient states, she never picked up the medication. Discussed MATCH program guideline and criteria is one per calendar year. Explained the importance of taking medication when prescribed and completing the treatment course. Patient verbalized understanding, teach back done. Discussed the [facility] for follow up care. Brochure given with address,phone number, and directions. Patient instructed to walk-in Mon- Friday at [place] to schedule and an appointment to establish care. Pt verbalizes understanding and appreciative of the assistance.  Updated R. Marlon Pel PA-C he is agreeable. No further ED CM needs identified.

## 2014-04-03 NOTE — ED Notes (Signed)
Case Manager contacted for assistance with payment of medications.

## 2014-04-03 NOTE — ED Provider Notes (Signed)
CSN: [id number]     Arrival date & time 04/03/14  1502 History   First MD Initiated Contact with Patient 04/03/14 1559     This chart was scribed for non-physician practitioner, Montine Circle PA-C working with Pamella Pert, MD by Forrestine Him, ED Scribe. This patient was seen in room TR04C/TR04C and the patient's care was started at 4:19 PM.   Chief Complaint  Patient presents with  . Eye Problem   The history is provided by the patient. No language interpreter was used.    HPI Comments: Roberta Calderon is a 43 y.o. Female with a PMHx of HTN who presents to the Emergency Department complaining of constant, moderate bilateral eye irritation onset today that is unchanged at this time. She also reports associated photophobia associated with irritation. She denies any known drainage from the eyes. She admits to frequent issues with her eyes as this is the third time she is seeking treatment. Denies any known contacts with eye issues. No fever noted. No known allergies to medications. No other concerns this visit.  Past Medical History  Diagnosis Date  . Hypertension    History reviewed. No pertinent past surgical history. No family history on file. History  Substance Use Topics  . Smoking status: Current Every Day Smoker -- 0.25 packs/day    Types: Cigarettes  . Smokeless tobacco: Not on file  . Alcohol Use: No   OB History   Grav Para Term Preterm Abortions TAB SAB Ect Mult Living                 Review of Systems  Constitutional: Negative for fever and chills.  Eyes: Positive for photophobia, pain (Bilateral) and redness (Bilateral). Negative for discharge, itching and visual disturbance.  Respiratory: Negative for shortness of breath.   Cardiovascular: Negative for chest pain.  Gastrointestinal: Negative for nausea, vomiting, diarrhea and constipation.  Genitourinary: Negative for dysuria.      Allergies  Review of patient's allergies indicates no known  allergies.  Home Medications   Prior to Admission medications   Not on File   Triage Vitals: BP 155/113  Pulse 101  Temp(Src) 98 F (36.7 C) (Oral)  Resp 18  Ht 5\' 2"  (1.575 m)  SpO2 98%  LMP 02/08/2014   Physical Exam  Nursing note and vitals reviewed. Constitutional: She is oriented to person, place, and time. She appears well-developed and well-nourished.  HENT:  Head: Normocephalic.  No ev if preseptal or preorbital cellulitis   Eyes: EOM are normal.  Fluorecin  exam unremarkable No evidence of corneal abrasion No retained foreign body No evident of enchantment  Limited fundoscopic exam appears normal  Neck: Normal range of motion.  Pulmonary/Chest: Effort normal.  Abdominal: She exhibits no distension.  Musculoskeletal: Normal range of motion.  Neurological: She is alert and oriented to person, place, and time.  Psychiatric: She has a normal mood and affect.    ED Course  Procedures (including critical care time)  DIAGNOSTIC STUDIES: Oxygen Saturation is 98% on RA, Normal by my interpretation.    COORDINATION OF CARE: 4:21 PM-Discussed treatment plan with pt at bedside and pt agreed to plan.     Labs Review Labs Reviewed - No data to display  Imaging Review No results found.   EKG Interpretation None      MDM   Final diagnoses:  Bilateral conjunctivitis   Patient with probable conjunctivitis.  Will treat with poly trim.  No evidence of preseptal or periorbital cellulitis.  No evidence of entrapment.  No foreign body. Recommend follow-up in 2 days.  Patient understands and agrees with the plan.  I personally performed the services described in this documentation, which was scribed in my presence. The recorded information has been reviewed and is accurate.    Montine Circle, PA-C 04/03/14 [place], PA-C 04/03/14 [phone]

## 2014-04-03 NOTE — ED Notes (Signed)
Declined W/C at D/C and was escorted to lobby by RN. 

## 2014-04-03 NOTE — Discharge Instructions (Signed)

## 2014-04-04 NOTE — ED Provider Notes (Signed)
Medical screening examination/treatment/procedure(s) were performed by non-physician practitioner and as supervising physician I was immediately available for consultation/collaboration.   EKG Interpretation None        Pamella Pert, MD 04/04/14 1226

## 2014-05-10 ENCOUNTER — Emergency Department (HOSPITAL_COMMUNITY)
Admission: EM | Admit: 2014-05-10 | Discharge: 2014-05-10 | Disposition: A | Discharge status: Home / Self Care | Payer: Self-pay | Attending: Emergency Medicine | Admitting: Emergency Medicine

## 2014-05-10 ENCOUNTER — Encounter (HOSPITAL_COMMUNITY): Payer: Self-pay | Admitting: Emergency Medicine

## 2014-05-10 DIAGNOSIS — I10 Essential (primary) hypertension: Secondary | ICD-10-CM | POA: Insufficient documentation

## 2014-05-10 DIAGNOSIS — K089 Disorder of teeth and supporting structures, unspecified: Secondary | ICD-10-CM | POA: Insufficient documentation

## 2014-05-10 DIAGNOSIS — K029 Dental caries, unspecified: Secondary | ICD-10-CM | POA: Insufficient documentation

## 2014-05-10 DIAGNOSIS — F17200 Nicotine dependence, unspecified, uncomplicated: Secondary | ICD-10-CM | POA: Insufficient documentation

## 2014-05-10 MED ORDER — PENICILLIN V POTASSIUM 250 MG PO TABS
500.0000 mg | ORAL_TABLET | Freq: Once | ORAL | Status: AC
  Administered 2014-05-10: 500 mg via ORAL
  Filled 2014-05-10: qty 2

## 2014-05-10 MED ORDER — IBUPROFEN 800 MG PO TABS
800.0000 mg | ORAL_TABLET | Freq: Once | ORAL | Status: AC
  Administered 2014-05-10: 800 mg via ORAL
  Filled 2014-05-10: qty 1

## 2014-05-10 MED ORDER — OXYCODONE-ACETAMINOPHEN 5-325 MG PO TABS
2.0000 | ORAL_TABLET | Freq: Once | ORAL | Status: AC
  Administered 2014-05-10: 2 via ORAL
  Filled 2014-05-10: qty 2

## 2014-05-10 MED ORDER — HYDROCHLOROTHIAZIDE 25 MG PO TABS: 25.0000 mg | ORAL_TABLET | Freq: Every day | ORAL | Status: DC

## 2014-05-10 MED ORDER — HYDROCODONE-ACETAMINOPHEN 5-325 MG PO TABS: 1.0000 | ORAL_TABLET | ORAL | Status: DC | PRN

## 2014-05-10 MED ORDER — PENICILLIN V POTASSIUM 500 MG PO TABS: 500.0000 mg | ORAL_TABLET | Freq: Four times a day (QID) | ORAL | Status: AC

## 2014-05-10 NOTE — ED Provider Notes (Addendum)
TIME SEEN: 10:45 PM  CHIEF COMPLAINT: Dental pain  HPI: Pt is a 43 y.o. female with history of hypertension who presents emergency department with complaints of several days of right lower dental pain. She's had some mild facial swelling but no fevers, warmth or drainage. No trismus or drooling. She does not have a dentist. She is hypertensive but denies headache, blurry vision, chest pain or shortness of breath, numbness, tingling or focal weakness. Reports she's been without her antihypertensives for 2 weeks. She does not know what medication she takes. She does not have a primary care physician.  ROS: See HPI Constitutional: no fever  Eyes: no drainage  ENT: no runny nose   Cardiovascular:  no chest pain  Resp: no SOB  GI: no vomiting GU: no dysuria Integumentary: no rash  Allergy: no hives  Musculoskeletal: no leg swelling  Neurological: no slurred speech ROS otherwise negative  PAST MEDICAL HISTORY/PAST SURGICAL HISTORY:  Past Medical History  Diagnosis Date  . Hypertension     MEDICATIONS:  Prior to Admission medications   Not on File    ALLERGIES:  No Known Allergies  SOCIAL HISTORY:  History  Substance Use Topics  . Smoking status: Current Every Day Smoker -- 0.25 packs/day    Types: Cigarettes  . Smokeless tobacco: Not on file  . Alcohol Use: No    FAMILY HISTORY: No family history on file.  EXAM: BP 172/113  Pulse 65  Temp(Src) 98.3 F (36.8 C) (Oral)  Resp 20  Ht 5\' 2"  (1.575 m)  Wt 135 lb (61.236 kg)  BMI 24.69 kg/m2  SpO2 100% CONSTITUTIONAL: Alert and oriented and responds appropriately to questions. Well-appearing; well-nourished HEAD: Normocephalic EYES: Conjunctivae clear, PERRL ENT: normal nose; no rhinorrhea; moist mucous membranes; pharynx without lesions noted, dental caries noted in the right lower third molar with no obvious sign of abscess, no trismus or drooling, no uvular deviation, no tonsillar hypertrophy or exudate, no  submandibular swelling, no swelling under the tongue NECK: Supple, no meningismus, no LAD  CARD: RRR; S1 and S2 appreciated; no murmurs, no clicks, no rubs, no gallops RESP: Normal chest excursion without splinting or tachypnea; breath sounds clear and equal bilaterally; no wheezes, no rhonchi, no rales,  ABD/GI: Normal bowel sounds; non-distended; soft, non-tender, no rebound, no guarding BACK:  The back appears normal and is non-tender to palpation, there is no CVA tenderness EXT: Normal ROM in all joints; non-tender to palpation; no edema; normal capillary refill; no cyanosis    SKIN: Normal color for age and race; warm NEURO: Moves all extremities equally, normal gait, cranial nerves II through XII intact, sensation to light touch intact diffusely PSYCH: The patient's mood and manner are appropriate. Grooming and personal hygiene are appropriate.  MEDICAL DECISION MAKING: Patient here with dental pain and asymptomatic hypertension. Suspect her hypertension is likely secondary to the fact she has been off medications for 2 weeks and that she is currently in pain. She is not sure what medication she was taking. I will restart her on hydrochlorothiazide and give her information for outpatient primary care providers. We'll also give her information for dental clinics. We'll put her on prophylactic penicillin and discharge with prescription for Vicodin. Discussed supportive care instructions and return precautions. She verbalized understanding and is comfortable with plan.      [place], DO 05/10/14 [place], DO 05/10/14 1201

## 2014-05-10 NOTE — Progress Notes (Addendum)
CM received phone call from patient and she stated that she is unable to afford her medications. This CM explained to the patient that the Penicillin and the HCTZ are on the [facility] $4 list. The patient stated that she is currently at [facility] and the pharmacist is stating that they do not have the Vicodin in stock but that she cannot afford it. This CM explained that the [facility] program does not cover narcotics and she is not eligible to use the [facility] program because she used it in June of 2015 and it can only be used 1 time in a year. This CM then spoke with the [facility] pharmacist and he confirmed the medications on the $4 list and this CM explained to the pharmacist that the [facility] program cannot be used for narcotics and the patient has already used the [facility] program this year. The pharmacist stated that he will reeducate the patient. No other questions or concerns at this time. Edwyna Shell, RN, BSN, [employer] 05/10/2014 1:08 PM

## 2014-05-10 NOTE — Discharge Instructions (Signed)
Dental Caries °Dental caries (also called tooth decay) is the most common oral disease. It can occur at any age but is more common in children and young adults.  °HOW DENTAL CARIES DEVELOPS  °The process of decay begins when bacteria and foods (particularly sugars and starches) combine in your mouth to produce plaque. Plaque is a substance that sticks to the hard, outer surface of a tooth (enamel). The bacteria in plaque produce acids that attack enamel. These acids may also attack the root surface of a tooth (cementum) if it is exposed. Repeated attacks dissolve these surfaces and create holes in the tooth (cavities). If left untreated, the acids destroy the other layers of the tooth.  °RISK FACTORS °· Frequent sipping of sugary beverages.   °· Frequent snacking on sugary and starchy foods, especially those that easily get stuck in the teeth.   °· Poor oral hygiene.   °· Dry mouth.   °· Substance abuse such as methamphetamine abuse.   °· Broken or poor-fitting dental restorations.   °· Eating disorders.   °· Gastroesophageal reflux disease (GERD).   °· Certain radiation treatments to the head and neck. °SYMPTOMS °In the early stages of dental caries, symptoms are seldom present. Sometimes white, chalky areas may be seen on the enamel or other tooth layers. In later stages, symptoms may include: °· Pits and holes on the enamel. °· Toothache after sweet, hot, or cold foods or drinks are consumed. °· Pain around the tooth. °· Swelling around the tooth. °DIAGNOSIS  °Most of the time, dental caries is detected during a regular dental checkup. A diagnosis is made after a thorough medical and dental history is taken and the surfaces of your teeth are checked for signs of dental caries. Sometimes special instruments, such as lasers, are used to check for dental caries. Dental X-ray exams may be taken so that areas not visible to the eye (such as between the contact areas of the teeth) can be checked for cavities.    °TREATMENT  °If dental caries is in its early stages, it may be reversed with a fluoride treatment or an application of a remineralizing agent at the dental office. Thorough brushing and flossing at home is needed to aid these treatments. If it is in its later stages, treatment depends on the location and extent of tooth destruction:  °· If a small area of the tooth has been destroyed, the destroyed area will be removed and cavities will be filled with a material such as gold, silver amalgam, or composite resin.   °· If a large area of the tooth has been destroyed, the destroyed area will be removed and a cap (crown) will be fitted over the remaining tooth structure.   °· If the center part of the tooth (pulp) is affected, a procedure called a root canal will be needed before a filling or crown can be placed.   °· If most of the tooth has been destroyed, the tooth may need to be pulled (extracted). °HOME CARE INSTRUCTIONS °You can prevent, stop, or reverse dental caries at home by practicing good oral hygiene. Good oral hygiene includes: °· Thoroughly cleaning your teeth at least twice a day with a toothbrush and dental floss.   °· Using a fluoride toothpaste. A fluoride mouth rinse may also be used if recommended by your dentist or health care provider.   °· Restricting the amount of sugary and starchy foods and sugary liquids you consume.   °· Avoiding frequent snacking on these foods and sipping of these liquids.   °· Keeping regular visits with   a dentist for checkups and cleanings. PREVENTION   Practice good oral hygiene.  Consider a dental sealant. A dental sealant is a coating material that is applied by your dentist to the pits and grooves of teeth. The sealant prevents food from being trapped in them. It may protect the teeth for several years.  Ask about fluoride supplements if you live in a community without fluorinated water or with water that has a low fluoride content. Use fluoride supplements  as directed by your dentist or health care provider.  Allow fluoride varnish applications to teeth if directed by your dentist or health care provider. Document Released: 05/05/2002 Document Revised: 12/28/2013 Document Reviewed: 08/15/2012 [facility] Patient Information 2015 [place], [place]. This information is not intended to replace advice given to you by your health care provider. Make sure you discuss any questions you have with your health care provider.  Hypertension Hypertension, commonly called high blood pressure, is when the force of blood pumping through your arteries is too strong. Your arteries are the blood vessels that carry blood from your heart throughout your body. A blood pressure reading consists of a higher number over a lower number, such as 110/72. The higher number (systolic) is the pressure inside your arteries when your heart pumps. The lower number (diastolic) is the pressure inside your arteries when your heart relaxes. Ideally you want your blood pressure below 120/80. Hypertension forces your heart to work harder to pump blood. Your arteries may become narrow or stiff. Having hypertension puts you at risk for heart disease, stroke, and other problems.  RISK FACTORS Some risk factors for high blood pressure are controllable. Others are not.  Risk factors you cannot control include:   Race. You may be at higher risk if you are African American.  Age. Risk increases with age.  Gender. Men are at higher risk than women before age 78 years. After age 37, women are at higher risk than men. Risk factors you can control include:  Not getting enough exercise or physical activity.  Being overweight.  Getting too much fat, sugar, calories, or salt in your diet.  Drinking too much alcohol. SIGNS AND SYMPTOMS Hypertension does not usually cause signs or symptoms. Extremely high blood pressure (hypertensive crisis) may cause headache, anxiety, shortness of breath, and  nosebleed. DIAGNOSIS  To check if you have hypertension, your health care provider will measure your blood pressure while you are seated, with your arm held at the level of your heart. It should be measured at least twice using the same arm. Certain conditions can cause a difference in blood pressure between your right and left arms. A blood pressure reading that is higher than normal on one occasion does not mean that you need treatment. If one blood pressure reading is high, ask your health care provider about having it checked again. TREATMENT  Treating high blood pressure includes making lifestyle changes and possibly taking medicine. Living a healthy lifestyle can help lower high blood pressure. You may need to change some of your habits. Lifestyle changes may include:  Following the DASH diet. This diet is high in fruits, vegetables, and whole grains. It is low in salt, red meat, and added sugars.  Getting at least 2 hours of brisk physical activity every week.  Losing weight if necessary.  Not smoking.  Limiting alcoholic beverages.  Learning ways to reduce stress. If lifestyle changes are not enough to get your blood pressure under control, your health care provider may prescribe medicine.  You may need to take more than one. Work closely with your health care provider to understand the risks and benefits. HOME CARE INSTRUCTIONS  Have your blood pressure rechecked as directed by your health care provider.   Take medicines only as directed by your health care provider. Follow the directions carefully. Blood pressure medicines must be taken as prescribed. The medicine does not work as well when you skip doses. Skipping doses also puts you at risk for problems.   Do not smoke.   Monitor your blood pressure at home as directed by your health care provider. SEEK MEDICAL CARE IF:   You think you are having a reaction to medicines taken.  You have recurrent headaches or feel  dizzy.  You have swelling in your ankles.  You have trouble with your vision. SEEK IMMEDIATE MEDICAL CARE IF:  You develop a severe headache or confusion.  You have unusual weakness, numbness, or feel faint.  You have severe chest or abdominal pain.  You vomit repeatedly.  You have trouble breathing. MAKE SURE YOU:   Understand these instructions.  Will watch your condition.  Will get help right away if you are not doing well or get worse. Document Released: 08/13/2005 Document Revised: 12/28/2013 Document Reviewed: 06/05/2013 [facility] Patient Information 2015 [place], [place]. This information is not intended to replace advice given to you by your health care provider. Make sure you discuss any questions you have with your health care provider.     Emergency Department Resource Guide 1) Find a Doctor and Pay Out of Pocket Although you won't have to find out who is covered by your insurance plan, it is a good idea to ask around and get recommendations. You will then need to call the office and see if the doctor you have chosen will accept you as a new patient and what types of options they offer for patients who are self-pay. Some doctors offer discounts or will set up payment plans for their patients who do not have insurance, but you will need to ask so you aren't surprised when you get to your appointment.  2) Contact Your Local Health Department Not all health departments have doctors that can see patients for sick visits, but many do, so it is worth a call to see if yours does. If you don't know where your local health department is, you can check in your phone book. The CDC also has a tool to help you locate your state's health department, and many state websites also have listings of all of their local health departments.  3) Find a [facility] If your illness is not likely to be very severe or complicated, you may want to try a walk in clinic. These are popping up all  over the country in pharmacies, drugstores, and shopping centers. They're usually staffed by nurse practitioners or physician assistants that have been trained to treat common illnesses and complaints. They're usually fairly quick and inexpensive. However, if you have serious medical issues or chronic medical problems, these are probably not your best option.  No Primary Care Doctor: - Call Health Connect at  [phone] - they can help you locate a primary care doctor that  accepts your insurance, provides certain services, etc. - Physician Referral Service- [phone]  Chronic Pain Problems: Organization         Address  Phone   Notes  [facility]  [phone] Patients need to be referred by their primary care doctor.  Medication Assistance: Organization         Address  Phone   Notes  [facility], The [place] [place], [place] [address] [phone] --Must be a resident of [facility] -- Must have NO insurance coverage whatsoever (no Medicaid/ Medicare, etc.) -- The pt. MUST have a primary care doctor that directs their care regularly and follows them in the community   MedAssist  [phone]   [employer]  [phone]    Agencies that provide inexpensive medical care: Organization         Address  Phone   Notes  [place] [facility]  [phone]   [facility]    [phone]   [facility] [place], [place] [address] [phone]   [place] [address] [place] [phone]   Planned Parenthood    [phone]   [facility]    [phone]   [place] and [place] [address], [place] Phone:  [phone], Fax:  [phone] Hours of Operation:  9 am - 6 pm, M-F.  Also accepts Medicaid/Medicare and self-pay.  [facility] for [place], [address], [place] Phone: [phone], Fax: [phone]. Hours of Operation:  8:30 am - 5:30 pm, M-F.  Also accepts Medicaid and self-pay.  [facility] [place] [address], [place] Phone: [phone]   [place], [place], [place] [phone], Ext. [phone] Mondays & Thursdays: 7-9 AM.  First 15 patients are seen on a first come, first serve basis.    [place] Providers:  Organization         Address  Phone   Notes  [facility] [address],  [phone] Also accepts self-pay patients.  [facility] [address], [place]  [phone]   [place], [address], [place] [phone]   [facility] [address] [place] [phone]   Lucianne Lei [address], [place]   [phone] Only accepts [place] Access [place] patients after they have their name applied to their card.   Self-Pay (no insurance) in [facility]:  Organization         Address  Phone   Notes  Sickle Cell Patients, [facility] [place] [phone]   [facility] [place] [phone]   [facility] [place]  [place], [place], [place] [phone]   [facility]/Dr. Osei-Bonsu  [address], [place] or [address], [place] [phone] Phone number for both [place] and [place] locations is the same.  [facility] [address], [place] [phone]   [facility] [address], [place] or [address] [phone]   [facility] [address] [place] [phone], phone; [phone], fax Sees patients 1st and 3rd Saturday of every  month.  Must not qualify for public or private insurance (i.e. Medicaid, Medicare, [place] Health Choice, Veterans' Benefits)  Household income should be no more than 200% of the poverty level The  clinic cannot treat you if you are pregnant or think you are pregnant  Sexually transmitted diseases are not treated at the clinic.    Dental Care: Organization         Address  Phone  Notes  [facility] Department of [facility] [place] [phone] Accepts children up to age 3 who are enrolled in [place] or [place]; pregnant women with a Medicaid card; and children who have applied for Medicaid or [place] Health Choice, but were declined, whose parents can pay a reduced fee at time of service.  [facility] Department of [facility]  [address], [place] [phone] Accepts children up to age 51 who are enrolled in [place] or [place]; pregnant women with a Medicaid card; and children who have applied for Medicaid or [place] Health Choice, but were declined, whose parents can pay a reduced fee at time of service.  [place] Adult Dental Access PROGRAM  [place] [phone] Patients are seen by appointment only. Walk-ins are not accepted. [place] will see patients 63 years of age and older. Monday - Tuesday (8am-5pm) Most Wednesdays (8:30-5pm) $30 per visit, cash only  [facility] Adult Dental Access PROGRAM  [address], [facility] [phone] Patients are seen by appointment only. Walk-ins are not accepted. [place] will see patients 56 years of age and older. One Wednesday Evening (Monthly: Volunteer Based).  $30 per visit, cash only  [place]  [phone] for adults; Children under age 20, call Graduate Pediatric Dentistry at [phone]. Children aged 44-14, please call [phone] to request a pediatric application.  Dental  services are provided in all areas of dental care including fillings, crowns and bridges, complete and partial dentures, implants, gum treatment, root canals, and extractions. Preventive care is also provided. Treatment is provided to both adults and children. Patients are selected via a lottery and there is often a waiting list.   [facility] [address] [place]  [phone] [email or web address]   [facility] [address] [place], [place] [phone], Ext. [phone] Second and Fourth Thursday of each month, opens at 6:30 AM; Clinic ends at 9 AM.  Patients are seen on a first-come first-served basis, and a limited number are seen during each clinic.   [facility]  [address] Hillard Danker [place], [place] [phone]   Eligibility Requirements You must have lived in [place], [place], or [place] counties for at least the last three months.   You cannot be eligible for state or federal sponsored [employer], including [employer], [place], or [employer].   You generally cannot be eligible for healthcare insurance through your employer.    How to apply: Eligibility screenings are held every Tuesday and Wednesday afternoon from 1:00 pm until 4:00 pm. You do not need an appointment for the interview!  [facility] [address] [place], [place]   [place]  [place] Department  [place]  [phone]    Behavioral Health Resources in the Community: Intensive Outpatient Programs Organization         Address  Phone  Notes  [place]. [address], [place], [place] [phone]   [facility] Outpatient [address], [place], [place]  [phone]   ADS: [facility] [address] [place], [place]   [place] [address]    [place], [place] or [phone]   Substance Abuse Resources Organization         Address  Phone  Notes  Alcohol and Drug Services  [phone]   [place]  [phone]   [facility] [place]   Chinita Pester  [phone]   Residential & Outpatient Substance Abuse Program  [phone]   Psychological Services Organization         Address  Phone  Notes  [facility] [place]  [place]  [phone]   [place] [address], [place] or [phone]    Mobile Crisis Teams Organization         Address  Phone  Notes  [facility], Mobile Crisis Care Unit  [phone]   [facility]  [address] [place], [place]   [place] [address], [place] [phone]    Self-Help/Support Groups Organization         Address  Phone             Notes  [place]. of [place] - variety of support groups  [place] Call for more information  Narcotics Anonymous (NA), [facility] [address], [employer] [place]  2 meetings at this location   Special educational needs teacher         Address  Phone  Notes  [facility]idential Treatment [place],    [place]  [phone]   [facility]  [address], [place] [phone], [place], [place]   [place], [place] [phone] Admissions: 8am-3pm M-F  Incentives Substance [place] [address]    [place], [place] [phone]   [facility] [address] [place], [place], [place]   [facility] [address]  [place], [place]   [facility] Programs - Intensive Outpatient [address]een [address], [place], [place]   [facility] ([place] [place].,  [place], [place]  [phone] or [phone]   [facility] Services (RTS) [address] [place], [place] Accepts Medicaid  [facility] H. [place] [address]  [place] [phone] Substance Abuse/Addiction Treatment   [facility] Organization         Address  Phone  Notes  [facility]  [phone]   Domenic Schwab, PhD [address] Arlis [place], [place]   [phone] or [phone]   [place], [place] [phone]   [facility] [address], [place], [place] [phone] Insurance/Medicaid/sponsorship through [facility] and Families [address], [address] [place]                                    [place], [place] [phone] [place] [address], [place] [phone]    Dr. Adele Schilder  [phone]   [facility] Dept. 1) [address], [place] 2) [place] 3)  [place] [address], [place] [phone])  [phone]) [phone]   °[place] Child Abuse Hotline (336) 342-1394 or (336) 342-3537 (After Hours)    ° ° °

## 2014-05-10 NOTE — ED Notes (Signed)
C/o lower back tooth ache onet last pm states she was here several weeks ago for same

## 2014-05-10 NOTE — ED Notes (Signed)
Notified Dr. Leonides Schanz of pt's blood pressure being high.

## 2015-03-07 ENCOUNTER — Emergency Department (HOSPITAL_COMMUNITY)
Admission: EM | Admit: 2015-03-07 | Discharge: 2015-03-07 | Disposition: A | Discharge status: Home / Self Care | Payer: Self-pay | Attending: Emergency Medicine | Admitting: Emergency Medicine

## 2015-03-07 ENCOUNTER — Encounter (HOSPITAL_COMMUNITY): Payer: Self-pay | Admitting: Nurse Practitioner

## 2015-03-07 DIAGNOSIS — I10 Essential (primary) hypertension: Secondary | ICD-10-CM | POA: Insufficient documentation

## 2015-03-07 DIAGNOSIS — Z720 Tobacco use: Secondary | ICD-10-CM | POA: Insufficient documentation

## 2015-03-07 DIAGNOSIS — G43909 Migraine, unspecified, not intractable, without status migrainosus: Secondary | ICD-10-CM | POA: Insufficient documentation

## 2015-03-07 DIAGNOSIS — G43009 Migraine without aura, not intractable, without status migrainosus: Secondary | ICD-10-CM

## 2015-03-07 MED ORDER — HYDROCHLOROTHIAZIDE 25 MG PO TABS: 25.0000 mg | ORAL_TABLET | Freq: Every day | ORAL | Status: DC

## 2015-03-07 MED ORDER — HYDROCHLOROTHIAZIDE 25 MG PO TABS
25.0000 mg | ORAL_TABLET | Freq: Every day | ORAL | Status: DC
  Administered 2015-03-07: 25 mg via ORAL
  Filled 2015-03-07: qty 1

## 2015-03-07 MED ORDER — KETOROLAC TROMETHAMINE 60 MG/2ML IM SOLN
60.0000 mg | Freq: Once | INTRAMUSCULAR | Status: AC
  Administered 2015-03-07: 60 mg via INTRAMUSCULAR
  Filled 2015-03-07: qty 2

## 2015-03-07 NOTE — Discharge Instructions (Signed)
DASH Eating Plan °DASH stands for "Dietary Approaches to Stop Hypertension." The DASH eating plan is a healthy eating plan that has been shown to reduce high blood pressure (hypertension). Additional health benefits may include reducing the risk of type 2 diabetes mellitus, heart disease, and stroke. The DASH eating plan may also help with weight loss. °WHAT DO I NEED TO KNOW ABOUT THE DASH EATING PLAN? °For the DASH eating plan, you will follow these general guidelines: °· Choose foods with a percent daily value for sodium of less than 5% (as listed on the food label). °· Use salt-free seasonings or herbs instead of table salt or sea salt. °· Check with your health care provider or pharmacist before using salt substitutes. °· Eat lower-sodium products, often labeled as "lower sodium" or "no salt added." °· Eat fresh foods. °· Eat more vegetables, fruits, and low-fat dairy products. °· Choose whole grains. Look for the word "whole" as the first word in the ingredient list. °· Choose fish and skinless chicken or turkey more often than red meat. Limit fish, poultry, and meat to 6 oz (170 g) each day. °· Limit sweets, desserts, sugars, and sugary drinks. °· Choose heart-healthy fats. °· Limit cheese to 1 oz (28 g) per day. °· Eat more home-cooked food and less restaurant, buffet, and fast food. °· Limit fried foods. °· Cook foods using methods other than frying. °· Limit canned vegetables. If you do use them, rinse them well to decrease the sodium. °· When eating at a restaurant, ask that your food be prepared with less salt, or no salt if possible. °WHAT FOODS CAN I EAT? °Seek help from a dietitian for individual calorie needs. °Grains °Whole grain or whole wheat bread. Brown rice. Whole grain or whole wheat pasta. Quinoa, bulgur, and whole grain cereals. Low-sodium cereals. Corn or whole wheat flour tortillas. Whole grain cornbread. Whole grain crackers. Low-sodium crackers. °Vegetables °Fresh or frozen vegetables  (raw, steamed, roasted, or grilled). Low-sodium or reduced-sodium tomato and vegetable juices. Low-sodium or reduced-sodium tomato sauce and paste. Low-sodium or reduced-sodium canned vegetables.  °Fruits °All fresh, canned (in natural juice), or frozen fruits. °Meat and Other Protein Products °Ground beef (85% or leaner), grass-fed beef, or beef trimmed of fat. Skinless chicken or turkey. Ground chicken or turkey. Pork trimmed of fat. All fish and seafood. Eggs. Dried beans, peas, or lentils. Unsalted nuts and seeds. Unsalted canned beans. °Dairy °Low-fat dairy products, such as skim or 1% milk, 2% or reduced-fat cheeses, low-fat ricotta or cottage cheese, or plain low-fat yogurt. Low-sodium or reduced-sodium cheeses. °Fats and Oils °Tub margarines without trans fats. Light or reduced-fat mayonnaise and salad dressings (reduced sodium). Avocado. Safflower, olive, or canola oils. Natural peanut or almond butter. °Other °Unsalted popcorn and pretzels. °The items listed above may not be a complete list of recommended foods or beverages. Contact your dietitian for more options. °WHAT FOODS ARE NOT RECOMMENDED? °Grains °White bread. White pasta. White rice. Refined cornbread. Bagels and croissants. Crackers that contain trans fat. °Vegetables °Creamed or fried vegetables. Vegetables in a cheese sauce. Regular canned vegetables. Regular canned tomato sauce and paste. Regular tomato and vegetable juices. °Fruits °Dried fruits. Canned fruit in light or heavy syrup. Fruit juice. °Meat and Other Protein Products °Fatty cuts of meat. Ribs, chicken wings, bacon, sausage, bologna, salami, chitterlings, fatback, hot dogs, bratwurst, and packaged luncheon meats. Salted nuts and seeds. Canned beans with salt. °Dairy °Whole or 2% milk, cream, half-and-half, and cream cheese. Whole-fat or sweetened yogurt. Full-fat   cheeses or blue cheese. Nondairy creamers and whipped toppings. Processed cheese, cheese spreads, or cheese  curds. Condiments Onion and garlic salt, seasoned salt, table salt, and sea salt. Canned and packaged gravies. Worcestershire sauce. Tartar sauce. Barbecue sauce. Teriyaki sauce. Soy sauce, including reduced sodium. Steak sauce. Fish sauce. Oyster sauce. Cocktail sauce. Horseradish. Ketchup and mustard. Meat flavorings and tenderizers. Bouillon cubes. Hot sauce. Tabasco sauce. Marinades. Taco seasonings. Relishes. Fats and Oils Butter, stick margarine, lard, shortening, ghee, and bacon fat. Coconut, palm kernel, or palm oils. Regular salad dressings. Other Pickles and olives. Salted popcorn and pretzels. The items listed above may not be a complete list of foods and beverages to avoid. Contact your dietitian for more information. WHERE CAN I FIND MORE INFORMATION? National Heart, Lung, and Blood Institute: travelstabloid.com Document Released: 08/02/2011 Document Revised: 12/28/2013 Document Reviewed: 06/17/2013 [facility] Patient Information 2015 [place], [place]. This information is not intended to replace advice given to you by your health care provider. Make sure you discuss any questions you have with your health care provider. Headaches, Frequently Asked Questions MIGRAINE HEADACHES Q: What is migraine? What causes it? How can I treat it? A: Generally, migraine headaches begin as a dull ache. Then they develop into a constant, throbbing, and pulsating pain. You may experience pain at the temples. You may experience pain at the front or back of one or both sides of the head. The pain is usually accompanied by a combination of:  Nausea.  Vomiting.  Sensitivity to light and noise. Some people (about 15%) experience an aura (see below) before an attack. The cause of migraine is believed to be chemical reactions in the brain. Treatment for migraine may include over-the-counter or prescription medications. It may also include self-help techniques. These include  relaxation training and biofeedback.  Q: What is an aura? A: About 15% of people with migraine get an "aura". This is a sign of neurological symptoms that occur before a migraine headache. You may see wavy or jagged lines, dots, or flashing lights. You might experience tunnel vision or blind spots in one or both eyes. The aura can include visual or auditory hallucinations (something imagined). It may include disruptions in smell (such as strange odors), taste or touch. Other symptoms include:  Numbness.  A "pins and needles" sensation.  Difficulty in recalling or speaking the correct word. These neurological events may last as long as 60 minutes. These symptoms will fade as the headache begins. Q: What is a trigger? A: Certain physical or environmental factors can lead to or "trigger" a migraine. These include:  Foods.  Hormonal changes.  Weather.  Stress. It is important to remember that triggers are different for everyone. To help prevent migraine attacks, you need to figure out which triggers affect you. Keep a headache diary. This is a good way to track triggers. The diary will help you talk to your healthcare professional about your condition. Q: Does weather affect migraines? A: Bright sunshine, hot, humid conditions, and drastic changes in barometric pressure may lead to, or "trigger," a migraine attack in some people. But studies have shown that weather does not act as a trigger for everyone with migraines. Q: What is the link between migraine and hormones? A: Hormones start and regulate many of your body's functions. Hormones keep your body in balance within a constantly changing environment. The levels of hormones in your body are unbalanced at times. Examples are during menstruation, pregnancy, or menopause. That can lead to a migraine attack. In  fact, about three quarters of all women with migraine report that their attacks are related to the menstrual cycle.  Q: Is there an  increased risk of stroke for migraine sufferers? A: The likelihood of a migraine attack causing a stroke is very remote. That is not to say that migraine sufferers cannot have a stroke associated with their migraines. In persons under age 47, the most common associated factor for stroke is migraine headache. But over the course of a person's normal life span, the occurrence of migraine headache may actually be associated with a reduced risk of dying from cerebrovascular disease due to stroke.  Q: What are acute medications for migraine? A: Acute medications are used to treat the pain of the headache after it has started. Examples over-the-counter medications, NSAIDs, ergots, and triptans.  Q: What are the triptans? A: Triptans are the newest class of abortive medications. They are specifically targeted to treat migraine. Triptans are vasoconstrictors. They moderate some chemical reactions in the brain. The triptans work on receptors in your brain. Triptans help to restore the balance of a neurotransmitter called serotonin. Fluctuations in levels of serotonin are thought to be a main cause of migraine.  Q: Are over-the-counter medications for migraine effective? A: Over-the-counter, or "OTC," medications may be effective in relieving mild to moderate pain and associated symptoms of migraine. But you should see your caregiver before beginning any treatment regimen for migraine.  Q: What are preventive medications for migraine? A: Preventive medications for migraine are sometimes referred to as "prophylactic" treatments. They are used to reduce the frequency, severity, and length of migraine attacks. Examples of preventive medications include antiepileptic medications, antidepressants, beta-blockers, calcium channel blockers, and NSAIDs (nonsteroidal anti-inflammatory drugs). Q: Why are anticonvulsants used to treat migraine? A: During the past few years, there has been an increased interest in antiepileptic  drugs for the prevention of migraine. They are sometimes referred to as "anticonvulsants". Both epilepsy and migraine may be caused by similar reactions in the brain.  Q: Why are antidepressants used to treat migraine? A: Antidepressants are typically used to treat people with depression. They may reduce migraine frequency by regulating chemical levels, such as serotonin, in the brain.  Q: What alternative therapies are used to treat migraine? A: The term "alternative therapies" is often used to describe treatments considered outside the scope of conventional Western medicine. Examples of alternative therapy include acupuncture, acupressure, and yoga. Another common alternative treatment is herbal therapy. Some herbs are believed to relieve headache pain. Always discuss alternative therapies with your caregiver before proceeding. Some herbal products contain arsenic and other toxins. TENSION HEADACHES Q: What is a tension-type headache? What causes it? How can I treat it? A: Tension-type headaches occur randomly. They are often the result of temporary stress, anxiety, fatigue, or anger. Symptoms include soreness in your temples, a tightening band-like sensation around your head (a "vice-like" ache). Symptoms can also include a pulling feeling, pressure sensations, and contracting head and neck muscles. The headache begins in your forehead, temples, or the back of your head and neck. Treatment for tension-type headache may include over-the-counter or prescription medications. Treatment may also include self-help techniques such as relaxation training and biofeedback. CLUSTER HEADACHES Q: What is a cluster headache? What causes it? How can I treat it? A: Cluster headache gets its name because the attacks come in groups. The pain arrives with little, if any, warning. It is usually on one side of the head. A tearing or bloodshot eye and a  runny nose on the same side of the headache may also accompany the pain.  Cluster headaches are believed to be caused by chemical reactions in the brain. They have been described as the most severe and intense of any headache type. Treatment for cluster headache includes prescription medication and oxygen. SINUS HEADACHES Q: What is a sinus headache? What causes it? How can I treat it? A: When a cavity in the bones of the face and skull (a sinus) becomes inflamed, the inflammation will cause localized pain. This condition is usually the result of an allergic reaction, a tumor, or an infection. If your headache is caused by a sinus blockage, such as an infection, you will probably have a fever. An x-ray will confirm a sinus blockage. Your caregiver's treatment might include antibiotics for the infection, as well as antihistamines or decongestants.  REBOUND HEADACHES Q: What is a rebound headache? What causes it? How can I treat it? A: A pattern of taking acute headache medications too often can lead to a condition known as "rebound headache." A pattern of taking too much headache medication includes taking it more than 2 days per week or in excessive amounts. That means more than the label or a caregiver advises. With rebound headaches, your medications not only stop relieving pain, they actually begin to cause headaches. Doctors treat rebound headache by tapering the medication that is being overused. Sometimes your caregiver will gradually substitute a different type of treatment or medication. Stopping may be a challenge. Regularly overusing a medication increases the potential for serious side effects. Consult a caregiver if you regularly use headache medications more than 2 days per week or more than the label advises. ADDITIONAL QUESTIONS AND ANSWERS Q: What is biofeedback? A: Biofeedback is a self-help treatment. Biofeedback uses special equipment to monitor your body's involuntary physical responses. Biofeedback monitors:  Breathing.  Pulse.  Heart  rate.  Temperature.  Muscle tension.  Brain activity. Biofeedback helps you refine and perfect your relaxation exercises. You learn to control the physical responses that are related to stress. Once the technique has been mastered, you do not need the equipment any more. Q: Are headaches hereditary? A: Four out of five (80%) of people that suffer report a family history of migraine. Scientists are not sure if this is genetic or a family predisposition. Despite the uncertainty, a child has a 50% chance of having migraine if one parent suffers. The child has a 75% chance if both parents suffer.  Q: Can children get headaches? A: By the time they reach high school, most young people have experienced some type of headache. Many safe and effective approaches or medications can prevent a headache from occurring or stop it after it has begun.  Q: What type of doctor should I see to diagnose and treat my headache? A: Start with your primary caregiver. Discuss his or her experience and approach to headaches. Discuss methods of classification, diagnosis, and treatment. Your caregiver may decide to recommend you to a headache specialist, depending upon your symptoms or other physical conditions. Having diabetes, allergies, etc., may require a more comprehensive and inclusive approach to your headache. The National Headache Foundation will provide, upon request, a list of [facility] physician members in your state. Document Released: 11/03/2003 Document Revised: 11/05/2011 Document Reviewed: 04/12/2008 [facility] Patient Information 2015 [place], [place]. This information is not intended to replace advice given to you by your health care provider. Make sure you discuss any questions you have with your health care  provider. Hypertension Hypertension, commonly called high blood pressure, is when the force of blood pumping through your arteries is too strong. Your arteries are the blood vessels that carry blood from your  heart throughout your body. A blood pressure reading consists of a higher number over a lower number, such as 110/72. The higher number (systolic) is the pressure inside your arteries when your heart pumps. The lower number (diastolic) is the pressure inside your arteries when your heart relaxes. Ideally you want your blood pressure below 120/80. Hypertension forces your heart to work harder to pump blood. Your arteries may become narrow or stiff. Having hypertension puts you at risk for heart disease, stroke, and other problems.  RISK FACTORS Some risk factors for high blood pressure are controllable. Others are not.  Risk factors you cannot control include:   Race. You may be at higher risk if you are African American.  Age. Risk increases with age.  Gender. Men are at higher risk than women before age 18 years. After age 41, women are at higher risk than men. Risk factors you can control include:  Not getting enough exercise or physical activity.  Being overweight.  Getting too much fat, sugar, calories, or salt in your diet.  Drinking too much alcohol. SIGNS AND SYMPTOMS Hypertension does not usually cause signs or symptoms. Extremely high blood pressure (hypertensive crisis) may cause headache, anxiety, shortness of breath, and nosebleed. DIAGNOSIS  To check if you have hypertension, your health care provider will measure your blood pressure while you are seated, with your arm held at the level of your heart. It should be measured at least twice using the same arm. Certain conditions can cause a difference in blood pressure between your right and left arms. A blood pressure reading that is higher than normal on one occasion does not mean that you need treatment. If one blood pressure reading is high, ask your health care provider about having it checked again. TREATMENT  Treating high blood pressure includes making lifestyle changes and possibly taking medicine. Living a healthy  lifestyle can help lower high blood pressure. You may need to change some of your habits. Lifestyle changes may include:  Following the DASH diet. This diet is high in fruits, vegetables, and whole grains. It is low in salt, red meat, and added sugars.  Getting at least 2 hours of brisk physical activity every week.  Losing weight if necessary.  Not smoking.  Limiting alcoholic beverages.  Learning ways to reduce stress. If lifestyle changes are not enough to get your blood pressure under control, your health care provider may prescribe medicine. You may need to take more than one. Work closely with your health care provider to understand the risks and benefits. HOME CARE INSTRUCTIONS  Have your blood pressure rechecked as directed by your health care provider.   Take medicines only as directed by your health care provider. Follow the directions carefully. Blood pressure medicines must be taken as prescribed. The medicine does not work as well when you skip doses. Skipping doses also puts you at risk for problems.   Do not smoke.   Monitor your blood pressure at home as directed by your health care provider. SEEK MEDICAL CARE IF:   You think you are having a reaction to medicines taken.  You have recurrent headaches or feel dizzy.  You have swelling in your ankles.  You have trouble with your vision. SEEK IMMEDIATE MEDICAL CARE IF:  You develop a severe headache  or confusion.  You have unusual weakness, numbness, or feel faint.  You have severe chest or abdominal pain.  You vomit repeatedly.  You have trouble breathing. MAKE SURE YOU:   Understand these instructions.  Will watch your condition.  Will get help right away if you are not doing well or get worse. Document Released: 08/13/2005 Document Revised: 12/28/2013 Document Reviewed: 06/05/2013 [facility] Patient Information 2015 [place], [place]. This information is not intended to replace advice given to you  by your health care provider. Make sure you discuss any questions you have with your health care provider.

## 2015-03-07 NOTE — ED Notes (Signed)
Declined W/C at D/C and was escorted to lobby by RN. 

## 2015-03-07 NOTE — ED Notes (Signed)
shes been out of blood pressure medications x 1 month. Today she began to have headaches, checked and her BP was elevate at home. She is A&Ox4, resp e/u

## 2015-03-07 NOTE — ED Provider Notes (Signed)
CSN: [id number]     Arrival date & time 03/07/15  1646 History  This chart was scribed for non-physician practitioner, Caryl Ada, PA-C working with Carmin Muskrat, MD by Rayna Sexton, ED scribe. This patient was seen in room TR04C/TR04C and the patient's care was started at 6:00 PM.     Chief Complaint  Patient presents with  . Headache   The history is provided by the patient. No language interpreter was used.    HPI Comments: Roberta Calderon is a 44 y.o. female who presents to the Emergency Department complaining of a moderate, constant, HA with onset this morning. Pt's notes that she has been on BP medication for 1 month and further notes that she has had an elevated BP since the onset of her migraine this morning. She denies taking any medications for her symptoms PTA. Pt notes having a ride home from the hospital. She denies any numbness, tingling or issues ambulating.   Past Medical History  Diagnosis Date  . Hypertension    History reviewed. No pertinent past surgical history. History reviewed. No pertinent family history. History  Substance Use Topics  . Smoking status: Current Every Day Smoker -- 0.25 packs/day    Types: Cigarettes  . Smokeless tobacco: Not on file  . Alcohol Use: Yes   OB History    No data available     Review of Systems  Neurological: Positive for headaches. Negative for numbness.  All other systems reviewed and are negative.  Allergies  Review of patient's allergies indicates no known allergies.  Home Medications   Prior to Admission medications   Medication Sig Start Date End Date Taking? Authorizing Provider  ibuprofen (ADVIL,MOTRIN) 200 MG tablet Take 800 mg by mouth every 6 (six) hours as needed for headache.   Yes Historical Provider, MD  hydrochlorothiazide (HYDRODIURIL) 25 MG tablet Take 1 tablet (25 mg total) by mouth daily. Patient not taking: Reported on 03/07/2015 05/10/14   Kristen N Ward, DO   BP 151/101 mmHg  Pulse 92   Temp(Src) 98.3 F (36.8 C) (Oral)  Resp 16  Ht 5\' 2"  (1.575 m)  Wt 137 lb 0.8 oz (62.165 kg)  BMI 25.06 kg/m2  SpO2 94% Physical Exam  Constitutional: She is oriented to person, place, and time. She appears well-developed and well-nourished. No distress.  HENT:  Head: Normocephalic and atraumatic.  Mouth/Throat: Oropharynx is clear and moist.  Eyes: Conjunctivae and EOM are normal. Pupils are equal, round, and reactive to light.  Neck: Normal range of motion. Neck supple. No tracheal deviation present.  Cardiovascular: Normal rate.   Pulmonary/Chest: Breath sounds normal. No respiratory distress.  Abdominal: Soft.  Musculoskeletal: Normal range of motion.  Neurological: She is alert and oriented to person, place, and time.  Skin: Skin is warm and dry.  Psychiatric: She has a normal mood and affect. Her behavior is normal.  Nursing note and vitals reviewed.   ED Course  Procedures  DIAGNOSTIC STUDIES: Oxygen Saturation is 94% on RA, adequate by my interpretation.    COORDINATION OF CARE: 6:03 PM Discussed treatment plan with pt at bedside and pt agreed to plan.  Labs Review Labs Reviewed - No data to display  Imaging Review No results found.   EKG Interpretation None      MDM   Final diagnoses:  Essential hypertension  Nonintractable migraine, unspecified migraine type    hctz rx torodol for pain avs   [name], PA-C 03/07/15 1816  Carmin Muskrat, MD  03/08/15 0020 

## 2015-03-11 ENCOUNTER — Telehealth: Payer: Self-pay | Admitting: *Deleted

## 2015-03-11 NOTE — Telephone Encounter (Signed)
Pt called to see if she could get assistance with Rx.  NCM informed pt that Rx is on $4 medication list at [facility].  Pt verbalizes understanding and took case management information in case there is any problem.

## 2016-02-29 ENCOUNTER — Emergency Department (HOSPITAL_COMMUNITY)
Admission: EM | Admit: 2016-02-29 | Discharge: 2016-02-29 | Disposition: A | Discharge status: Home / Self Care | Payer: Self-pay | Attending: Emergency Medicine | Admitting: Emergency Medicine

## 2016-02-29 ENCOUNTER — Emergency Department (HOSPITAL_COMMUNITY): Payer: Self-pay

## 2016-02-29 ENCOUNTER — Encounter (HOSPITAL_COMMUNITY): Payer: Self-pay | Admitting: *Deleted

## 2016-02-29 DIAGNOSIS — R0789 Other chest pain: Secondary | ICD-10-CM | POA: Insufficient documentation

## 2016-02-29 DIAGNOSIS — I10 Essential (primary) hypertension: Secondary | ICD-10-CM | POA: Insufficient documentation

## 2016-02-29 DIAGNOSIS — K0889 Other specified disorders of teeth and supporting structures: Secondary | ICD-10-CM | POA: Insufficient documentation

## 2016-02-29 DIAGNOSIS — F17210 Nicotine dependence, cigarettes, uncomplicated: Secondary | ICD-10-CM | POA: Insufficient documentation

## 2016-02-29 DIAGNOSIS — Z79899 Other long term (current) drug therapy: Secondary | ICD-10-CM | POA: Insufficient documentation

## 2016-02-29 LAB — URINALYSIS, ROUTINE W REFLEX MICROSCOPIC
Glucose, UA: NEGATIVE mg/dL
Hgb urine dipstick: NEGATIVE
Ketones, ur: 15 mg/dL — AB
Leukocytes, UA: NEGATIVE
Nitrite: NEGATIVE
Protein, ur: NEGATIVE mg/dL
Specific Gravity, Urine: 1.028 (ref 1.005–1.030)
pH: 5 (ref 5.0–8.0)

## 2016-02-29 LAB — CBC WITH DIFFERENTIAL/PLATELET
Basophils Absolute: 0 10*3/uL (ref 0.0–0.1)
Basophils Relative: 0 %
Eosinophils Absolute: 0.1 10*3/uL (ref 0.0–0.7)
Eosinophils Relative: 2 %
HCT: 42.2 % (ref 36.0–46.0)
Hemoglobin: 13.8 g/dL (ref 12.0–15.0)
Lymphocytes Relative: 35 %
Lymphs Abs: 2.3 10*3/uL (ref 0.7–4.0)
MCH: 26.5 pg (ref 26.0–34.0)
MCHC: 32.7 g/dL (ref 30.0–36.0)
MCV: 81.2 fL (ref 78.0–100.0)
Monocytes Absolute: 0.4 10*3/uL (ref 0.1–1.0)
Monocytes Relative: 6 %
Neutro Abs: 3.7 10*3/uL (ref 1.7–7.7)
Neutrophils Relative %: 57 %
Platelets: 297 10*3/uL (ref 150–400)
RBC: 5.2 MIL/uL — ABNORMAL HIGH (ref 3.87–5.11)
RDW: 13.7 % (ref 11.5–15.5)
WBC: 6.6 10*3/uL (ref 4.0–10.5)

## 2016-02-29 LAB — BASIC METABOLIC PANEL
Anion gap: 8 (ref 5–15)
BUN: 6 mg/dL (ref 6–20)
CO2: 23 mmol/L (ref 22–32)
Calcium: 9.4 mg/dL (ref 8.9–10.3)
Chloride: 106 mmol/L (ref 101–111)
Creatinine, Ser: 0.71 mg/dL (ref 0.44–1.00)
GFR calc Af Amer: >60 mL/min (ref >60)
GFR calc non Af Amer: >60 mL/min (ref >60)
Glucose, Bld: 126 mg/dL — ABNORMAL HIGH (ref 65–99)
Potassium: 3.6 mmol/L (ref 3.5–5.1)
Sodium: 137 mmol/L (ref 135–145)

## 2016-02-29 IMAGING — DX DG CHEST 2V
2 series · 2 of 2 positions shown · non-contrast
Comparison: None.

CLINICAL DATA: Right side chest pain for 1 week. Initial encounter.

EXAM:
CHEST  2 VIEW

[chest pa]
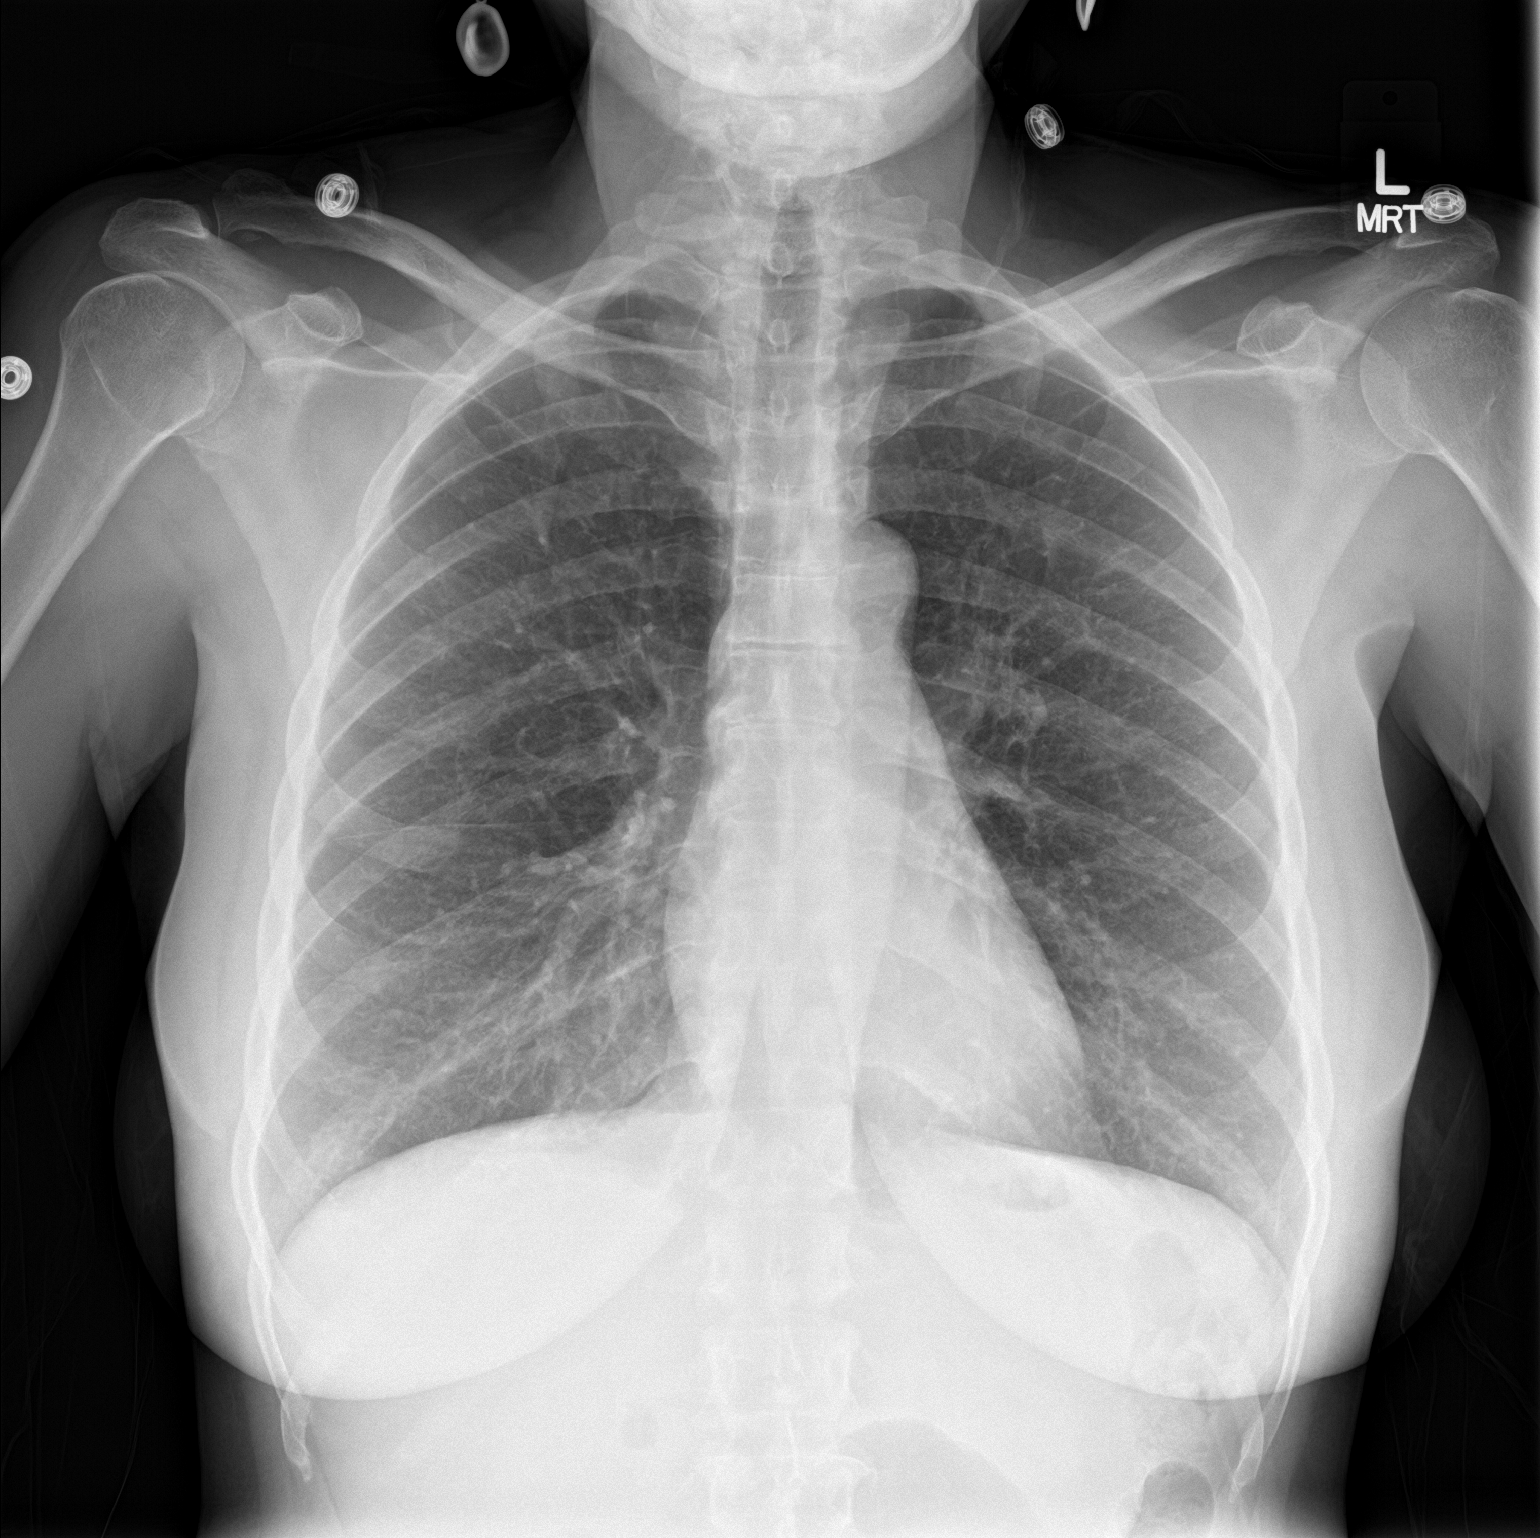

[chest lat]
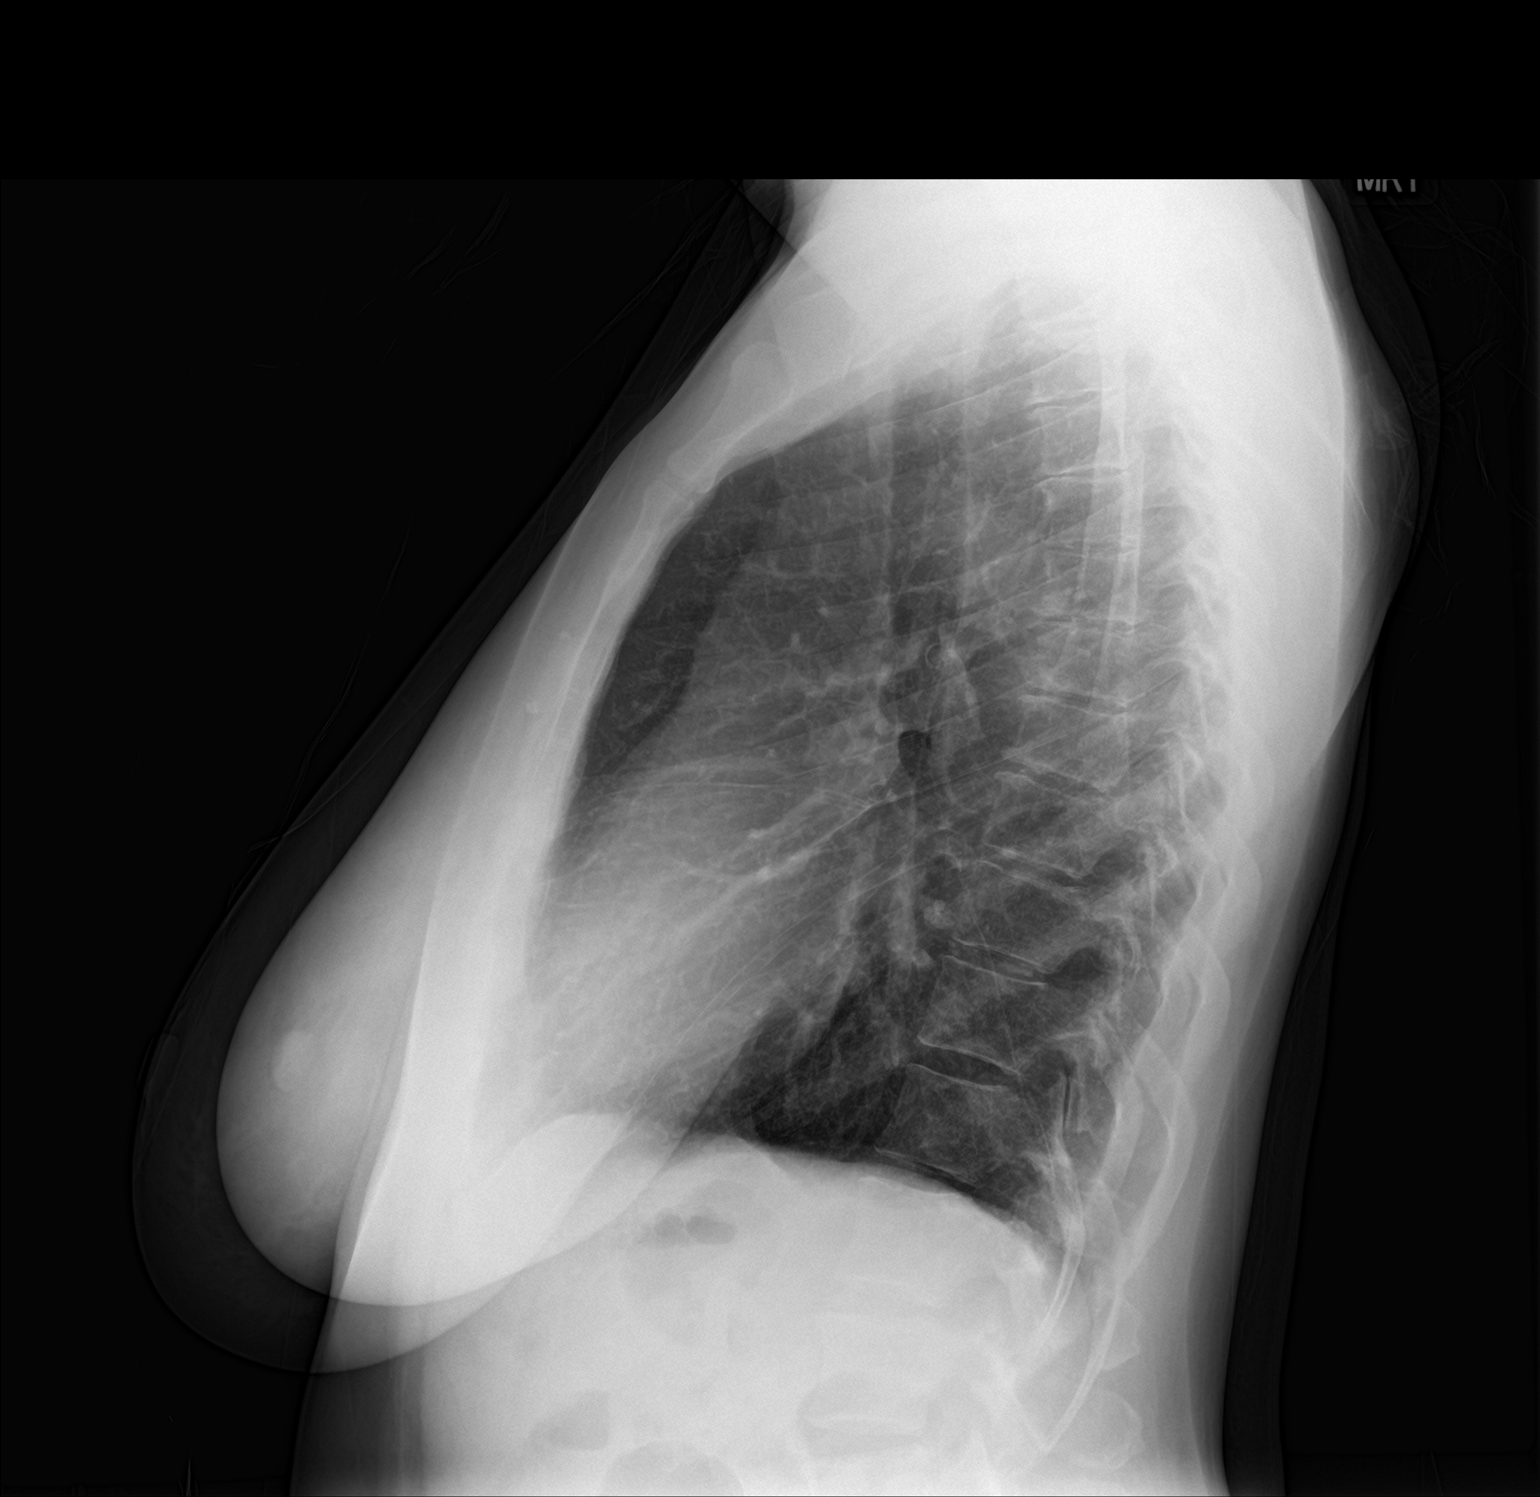

[2 of 2 positions shown; findings below may reference images not displayed]

FINDINGS: The lungs are clear. Heart size is normal. No pneumothorax or
pleural fluid. No focal bony abnormality.
IMPRESSION: Negative chest.

## 2016-02-29 MED ORDER — PENICILLIN V POTASSIUM 500 MG PO TABS: 500.0000 mg | ORAL_TABLET | Freq: Three times a day (TID) | ORAL | Status: AC

## 2016-02-29 MED ORDER — HYDROCHLOROTHIAZIDE 25 MG PO TABS
25.0000 mg | ORAL_TABLET | Freq: Every day | ORAL | Status: DC
Start: 2016-02-29 — End: 2020-04-08

## 2016-02-29 MED ORDER — IBUPROFEN 800 MG PO TABS: 800.0000 mg | ORAL_TABLET | Freq: Three times a day (TID) | ORAL | Status: DC | PRN

## 2016-02-29 NOTE — Discharge Instructions (Signed)
Return here as needed. Follow up with your primary doctor. I recommend following up with the health and wellness clinic listed above within the next week to have your blood pressure rechecked and establish care with a primary care provider for further management of your elevated blood pressure. I recommend following up with one of the dental clinics listed below for further management of your dental pain.  [facility] [address] [place], [place] [address] Phone [phone]  The [facility] in [place], [place], exemplifies the [employer] vision to improve the health and quality of life of all [place] by Regulatory affairs officer with a passion to care for the underserved and by leading the nation in community-based, service learning oral health education.  We are committed to offering comprehensive general dental services for adults, children and special needs patients in a safe, caring and professional setting.   Appointments: Our clinic is open Monday through Friday 8:00 a.m. until 5:00 p.m. The amount of time scheduled for an appointment depends on the patients specific needs. We ask that you keep your appointed time for care or provide 24-hour notice of all appointment changes. Parents or legal guardians must accompany minor children.   Payment for Services: Medicaid and other insurance plans are welcome. Payment for services is due when services are rendered and may be made by cash or credit card. If you have dental insurance, we will assist you with your claim submission.    Emergencies:  Emergency services will be provided Monday through Friday on a walk-in basis.  Please arrive early for emergency services. After hours emergency services will be provided for patients of record as required.   Services:    Personnel officer Dentistry Oral Surgery - Extractions Root Canals Sealants and Tooth Colored Fillings Crowns and Bridges Dentures and Partial Dentures Implant Services Periodontal Services and Cleanings Cosmetic Risk manager [employer] [facility]   [employer] [facility] [employer] is a great source of information about community services available.  Access by dialing 2-1-1 from anywhere in [place], or by website -  CustodianSupply.fi.   Other Local Resources (Updated 08/2015)  Dental  Care   Services    Phone Number and Address  Cost  [facility] For children 78 - 45 years of age:   Cleaning  Tooth brushing/flossing instruction  Sealants, fillings, crowns  Extractions  Emergency treatment  [phone] [address], [place] [address] Charges based on family income.  Medicaid and some insurance plans accepted.     [facility] - [facility], fillings, crowns  Extractions  Emergency treatment [phone] [address], [place]  Pregnant women 38 years of age or older with a Medicaid card  [facility] - [place]  Cleaning  Sealants, fillings, crowns  Extractions  Emergency treatment [phone] [address] [place], [place] Pregnant women 38 years of age or older with a Medicaid card  [facility] For children 80 - 23 years of age:   Cleaning  Tooth brushing/flossing instruction  Sealants, fillings, crowns  Extractions  Emergency treatment Limited orthodontic services for patients with Medicaid [phone] [address], [place] [address] Medicaid and [facility] Health Choice cover for children up to  age 60 and pregnant women.  Parents of children up to age 56 without Medicaid  pay a reduced fee at time of service.  [facility] For children 29 - 65 years of age:   Cleaning  Tooth brushing/flossing instruction  Sealants, fillings, crowns  Extractions  Emergency treatment Limited orthodontic services for patients with Medicaid [phone] [place], [place].  Medicaid and [place] Health Choice cover for children up to age 33 and pregnant women.  Parents of children up to age 63 without Medicaid pay a reduced fee.  [facility]  Sealants, fillings, crowns  Extractions  Hours: Tuesdays and Thursdays, 4:15 - 8 pm [phone] [address], [place], [place] [address] Services free of charge to [facility] residents ages 18-64 who do not have health insurance, Medicare, [place], or [place] benefits and fall within federal poverty guidelines  [place] care in addition to primary medical care, nutritional counseling, and pharmacy:  Engineer, drilling, fillings, crowns  Extractions                  [phone] [facility], [place], [place], [place], [place], [place], [place], [facility] [place], [place], [place], most insurance.  Also provides services available to all with fees adjusted based on ability to pay.    [facility]  Cleaning  Tooth brushing/flossing instruction  Sealants, fillings, crowns  Extractions  Emergency treatment Hours: Tuesdays, Thursdays, and Fridays from 8 am to 5 pm by appointment only. [phone] [place],  [place] [address] [facility] residents with Medicaid (depending on eligibility) and children with [facility] Health Choice - call for more information.  [facility]  Extractions only  Hours: 2nd and 4th Thursday of each month from 6:30 am - 9 am.   [phone] ext. [place], [place] [address] Ages 12 and older only.  Patients are seen on a first come, first served basis.  [employer]  [employer]  Extractions  Orthodontics  Endodontics  Implants/Crowns/Bridges  Complete and partial dentures [phone] [place],  Patients must complete an application for services.  There is often a waiting list.

## 2016-02-29 NOTE — ED Notes (Signed)
PA at bedside.

## 2016-02-29 NOTE — ED Notes (Signed)
Pt transported to xray 

## 2016-02-29 NOTE — ED Provider Notes (Signed)
Hand-off from [employer], PA-C.   Briefly, pt is a 45 yo female with PMH of HTN who presents to the ED with complaint of breast mass that seems to move around which she notes has been present for many years. Pt has mammogram preformed when the mass first appeared 5 years ago which was unremarkable. She also reports having diarrhea over the last week with urinary sxs. Denies any other sxs.   Physical Exam  BP 178/98 mmHg  Pulse 97  Temp(Src) 98.7 F (37.1 C) (Oral)  Resp 20  SpO2 99%  LMP 02/22/2016  Physical Exam  Constitutional: She is oriented to person, place, and time. She appears well-developed and well-nourished. No distress.  HENT:  Head: Normocephalic and atraumatic.  Mouth/Throat: Uvula is midline, oropharynx is clear and moist and mucous membranes are normal. No trismus in the jaw. Abnormal dentition. Dental caries present. No dental abscesses or uvula swelling. No oropharyngeal exudate, posterior oropharyngeal edema, posterior oropharyngeal erythema or tonsillar abscesses.    Eyes: Conjunctivae and EOM are normal. Right eye exhibits no discharge. Left eye exhibits no discharge. No scleral icterus.  Neck: Normal range of motion. Neck supple.  Cardiovascular: Normal rate, regular rhythm, normal heart sounds and intact distal pulses.   Pulmonary/Chest: Effort normal. No respiratory distress.  Abdominal: Soft. She exhibits no distension.  Musculoskeletal: She exhibits no edema.  Neurological: She is alert and oriented to person, place, and time.  Skin: Skin is warm and dry. She is not diaphoretic.  Nursing note and vitals reviewed.   ED Course  Procedures  MDM  Patient presents with multiple complaints including intermittent movable breast mass, intermittent diarrhea with urinary symptoms over the past week. Exam performed by initial provider revealed mild tenderness over right superior anterior chest, breast exam unremarkable, no mass palpated, no signs of skin infection  or rash. Abdominal exam benign. Chest x-ray negative. Labs unremarkable. UA pending at handoff.  On my initial evaluation. Patient reports having right lower dental pain for the past few weeks. Denies fever, facial/neck swelling, trismus, drooling, dysphasia, drainage. Patient denies having a dentist. Exam revealed mild swelling and erythema to surrounding gingiva of tooth number 32, no evidence of abscess. Plan to discharge patient home with penicillin, NSAIDs and symptomatically treatment with dental referral. UA unremarkable, pt able to tolerate PO in the ED. Pt without any episodes of diarrhea while in the ED. Plan to discharge patient home with symptomatic treatment for chest pain and diarrhea and PCP follow-up. Discussed return precautions with patient.  Patients blood pressure 170/118 on initial evaluation. Patient reports history of hypertension and denies taking her medications for the past few weeks and reports being out of her medication. Patient denies headache, visual changes, lightheadedness, dizziness, shortness of breath, chest pain, abdominal pain, numbness, tingling, weakness. No signs of hypertensive emergency or urgency at this time. Pt given rx for 25mg  HCTZ and advised patient to take her medications after being discharged from the ED today. Advised patient to follow up with her PCP in one week to have her blood pressure rechecked.     Chesley Noon [place], [place] 02/29/16 2057  Francine Graven, DO 03/01/16 1530

## 2016-02-29 NOTE — ED Provider Notes (Signed)
CSN: [id number]     Arrival date & time 02/29/16  1810 History   First MD Initiated Contact with Patient 02/29/16 1827     Chief Complaint  Patient presents with  . Mass     (Consider location/radiation/quality/duration/timing/severity/associated sxs/prior Treatment) HPI Patient presents to the emergency department with a breast mass that seems to come and go and move around.  The patient states that this has been ongoing for many years.  She had a mammogram when it started 5 years ago that was negative, but she states she does not believe that those results are accurate.  The patient states that she has also had some diarrhea over the last week along with some urinary symptoms.  Patient states that she also has some swelling over her right shoulder that she noticed that was worse last night. The patient denies chest pain, shortness of breath, headache,blurred vision, neck pain, fever, cough, weakness, numbness, dizziness, anorexia, edema, abdominal pain, nausea, vomiting, diarrhea, rash, back pain, dysuria, hematemesis, bloody stool, near syncope, or syncope. Past Medical History  Diagnosis Date  . Hypertension    History reviewed. No pertinent past surgical history. History reviewed. No pertinent family history. Social History  Substance Use Topics  . Smoking status: Current Every Day Smoker -- 0.25 packs/day    Types: Cigarettes  . Smokeless tobacco: None  . Alcohol Use: Yes   OB History    No data available     Review of Systems  All other systems negative except as documented in the HPI. All pertinent positives and negatives as reviewed in the HPI.  Allergies  Review of patient's allergies indicates no known allergies.  Home Medications   Prior to Admission medications   Medication Sig Start Date End Date Taking? Authorizing Provider  hydrochlorothiazide (HYDRODIURIL) 25 MG tablet Take 1 tablet (25 mg total) by mouth daily. 03/07/15   [name], PA-C  ibuprofen  (ADVIL,MOTRIN) 200 MG tablet Take 800 mg by mouth every 6 (six) hours as needed for headache.    Historical Provider, MD   BP 178/98 mmHg  Pulse 97  Temp(Src) 98.7 F (37.1 C) (Oral)  Resp 20  SpO2 99%  LMP 02/22/2016 Physical Exam  Constitutional: She is oriented to person, place, and time. She appears well-developed and well-nourished. No distress.  HENT:  Head: Normocephalic and atraumatic.  Mouth/Throat: Oropharynx is clear and moist.  Eyes: Pupils are equal, round, and reactive to light.  Neck: Normal range of motion. Neck supple.  Cardiovascular: Normal rate, regular rhythm and normal heart sounds.  Exam reveals no gallop and no friction rub.   No murmur heard. Pulmonary/Chest: Effort normal and breath sounds normal. No respiratory distress. She has no wheezes. Chest wall is not dull to percussion. She exhibits tenderness. She exhibits no mass. Right breast exhibits no inverted nipple, no mass, no nipple discharge, no skin change and no tenderness. Left breast exhibits no inverted nipple, no mass, no nipple discharge, no skin change and no tenderness. Breasts are symmetrical.    Abdominal: Soft. Bowel sounds are normal. She exhibits no distension. There is no tenderness.  Neurological: She is alert and oriented to person, place, and time. She exhibits normal muscle tone. Coordination normal.  Skin: Skin is warm and dry. No rash noted. No erythema.  Psychiatric: She has a normal mood and affect. Her behavior is normal.  Nursing note and vitals reviewed.   ED Course  Procedures (including critical care time) Labs Review Labs Reviewed  CBC  WITH DIFFERENTIAL/PLATELET - Abnormal; Notable for the following:    RBC 5.20 (*)    All other components within normal limits  BASIC METABOLIC PANEL  URINALYSIS, ROUTINE W REFLEX MICROSCOPIC (NOT AT [facility])    Imaging Review Dg Chest 2 View  02/29/2016  CLINICAL DATA:  Right side chest pain for 1 week. Initial encounter. EXAM: CHEST  2  VIEW COMPARISON:  None. FINDINGS: The lungs are clear. Heart size is normal. No pneumothorax or pleural fluid. No focal bony abnormality. IMPRESSION: Negative chest. Electronically Signed   By: Inge Rise M.D.   On: 02/29/2016 19:54   I have personally reviewed and evaluated these images and lab results as part of my medical decision-making.   EKG Interpretation None      MDM   Final diagnoses:  None    The patient has no abnormalities noted on chest x-ray.  This breast mass is not palpable on my exam, the patient does have some chest wall discomfort on palpation but no other symptoms.  There is no signs of infection or skin issues.  I feel the patient does not have any significant findings at this time   Dalia Heading, PA-C 02/29/16 [place], DO 03/01/16 1530

## 2016-02-29 NOTE — ED Notes (Signed)
PA aware of bp 158/110. Pt being discharged with oral HTN medicines and follow up with [facility] clinic to flow up with high blood pressure.

## 2016-02-29 NOTE — ED Notes (Signed)
Pt in c/o toothache and "lump" in her right shoulder, states she also has a lump to her left breast that is tender to touch and it moves around, seen for same in the past, no distress noted

## 2016-07-31 ENCOUNTER — Encounter (HOSPITAL_COMMUNITY): Payer: Self-pay | Admitting: Emergency Medicine

## 2016-07-31 ENCOUNTER — Emergency Department (HOSPITAL_COMMUNITY)
Admission: EM | Admit: 2016-07-31 | Discharge: 2016-07-31 | Disposition: A | Discharge status: Home / Self Care | Payer: Self-pay | Attending: Emergency Medicine | Admitting: Emergency Medicine

## 2016-07-31 ENCOUNTER — Emergency Department (HOSPITAL_COMMUNITY): Payer: Self-pay

## 2016-07-31 DIAGNOSIS — R079 Chest pain, unspecified: Secondary | ICD-10-CM | POA: Insufficient documentation

## 2016-07-31 DIAGNOSIS — I10 Essential (primary) hypertension: Secondary | ICD-10-CM | POA: Insufficient documentation

## 2016-07-31 DIAGNOSIS — M545 Low back pain, unspecified: Secondary | ICD-10-CM

## 2016-07-31 DIAGNOSIS — F17210 Nicotine dependence, cigarettes, uncomplicated: Secondary | ICD-10-CM | POA: Insufficient documentation

## 2016-07-31 DIAGNOSIS — Z79899 Other long term (current) drug therapy: Secondary | ICD-10-CM | POA: Insufficient documentation

## 2016-07-31 LAB — BASIC METABOLIC PANEL
Anion gap: 8 (ref 5–15)
BUN: 6 mg/dL (ref 6–20)
CALCIUM: 9.3 mg/dL (ref 8.9–10.3)
CO2: 25 mmol/L (ref 22–32)
Chloride: 105 mmol/L (ref 101–111)
Creatinine, Ser: 0.68 mg/dL (ref 0.44–1.00)
GFR calc Af Amer: >60 mL/min (ref >60)
GLUCOSE: 88 mg/dL (ref 65–99)
POTASSIUM: 3.9 mmol/L (ref 3.5–5.1)
Sodium: 138 mmol/L (ref 135–145)

## 2016-07-31 LAB — CBC
HEMATOCRIT: 41.5 % (ref 36.0–46.0)
Hemoglobin: 13.7 g/dL (ref 12.0–15.0)
MCH: 26.6 pg (ref 26.0–34.0)
MCHC: 33 g/dL (ref 30.0–36.0)
MCV: 80.6 fL (ref 78.0–100.0)
Platelets: 285 10*3/uL (ref 150–400)
RBC: 5.15 MIL/uL — ABNORMAL HIGH (ref 3.87–5.11)
RDW: 14 % (ref 11.5–15.5)
WBC: 5.6 10*3/uL (ref 4.0–10.5)

## 2016-07-31 LAB — I-STAT TROPONIN, ED
Troponin i, poc: 0 ng/mL (ref 0.00–0.08)
Troponin i, poc: 0.15 ng/mL (ref 0.00–0.08)

## 2016-07-31 IMAGING — DX DG CHEST 2V
2 series · 2 of 2 positions shown · non-contrast
Comparison: POLO BROCQ

CLINICAL DATA: Left-sided chest pain and back pain radiating down
left arm. Cough x2 days. Dyspnea while lying flat.

EXAM:
CHEST  2 VIEW

[chest pa]
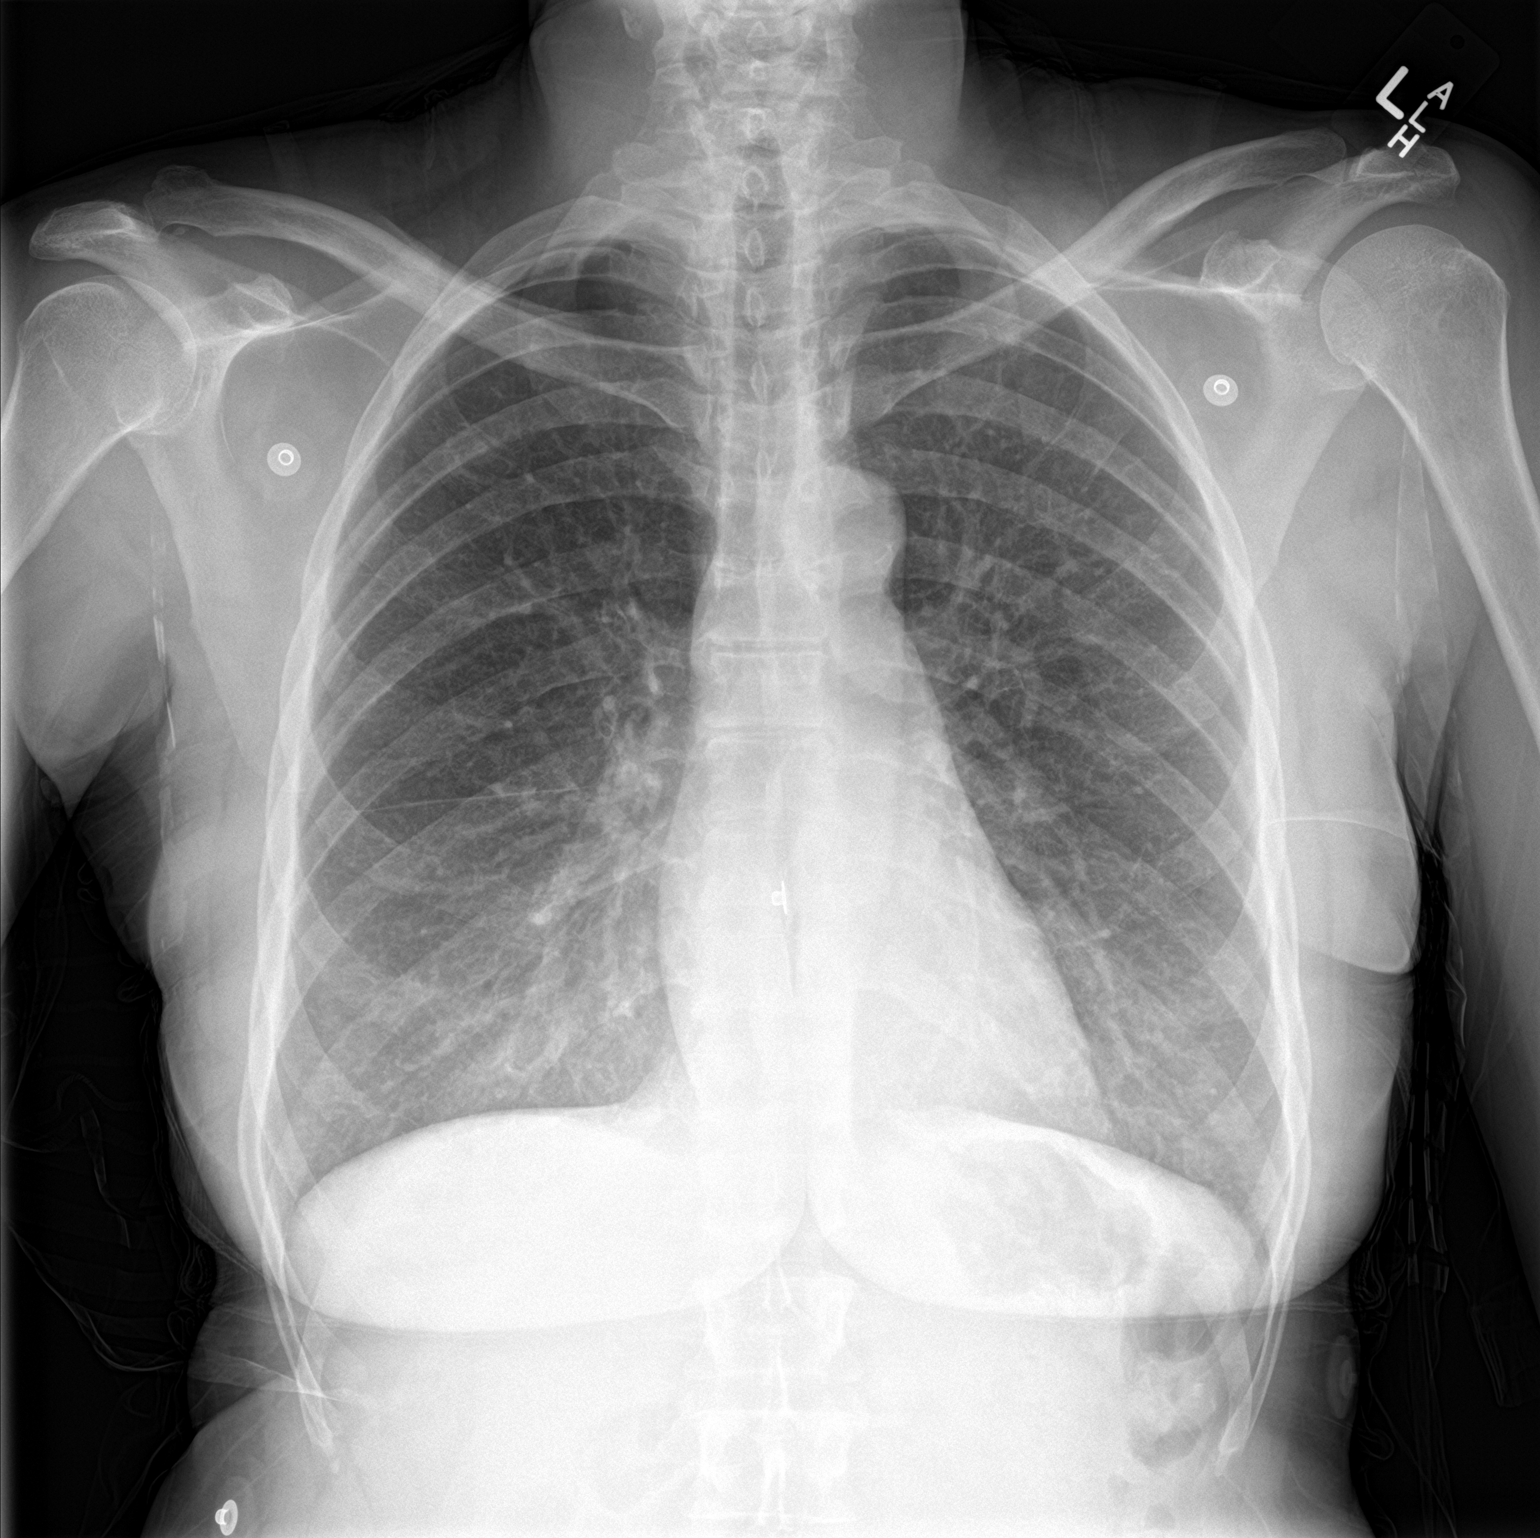

[chest lat]
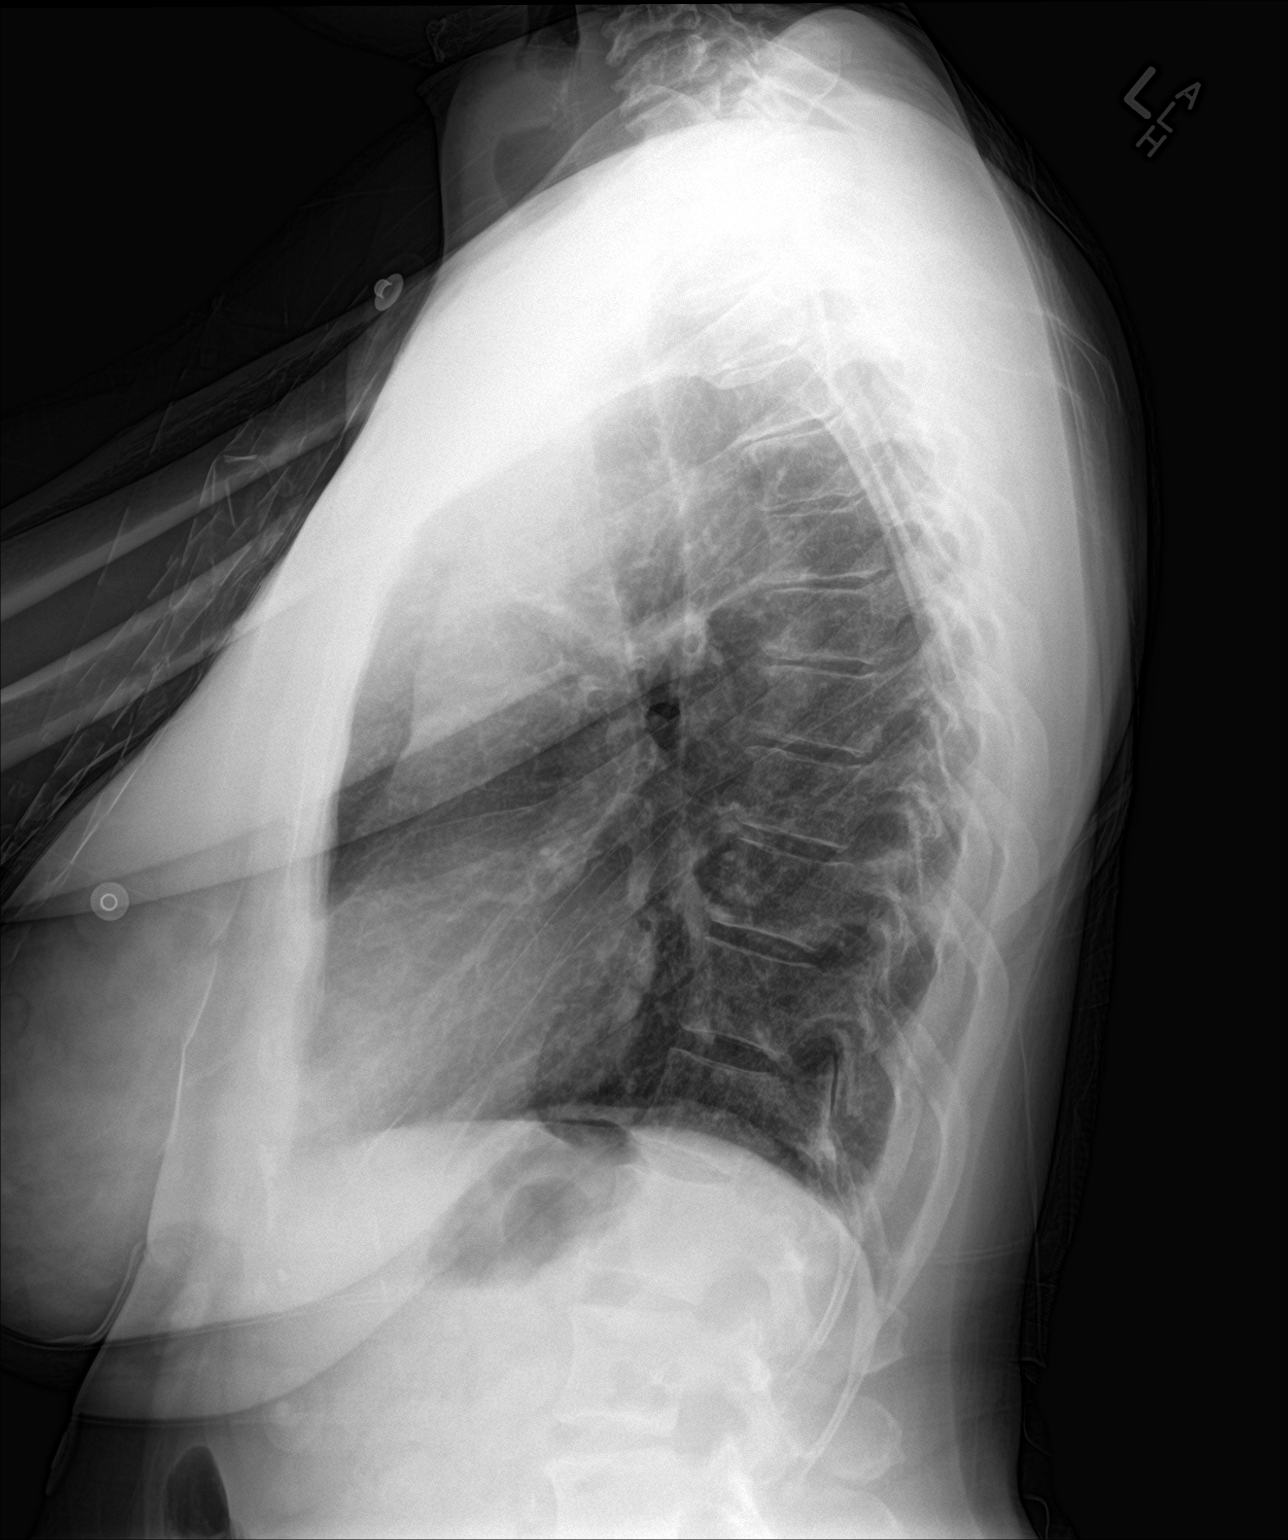

[2 of 2 positions shown; findings below may reference images not displayed]

FINDINGS: The heart size and mediastinal contours are within normal limits.
Mild increase in interstitial prominence may reflect bronchitic
change. No pneumonic consolidation or CHF. No effusion. The
visualized skeletal structures are unremarkable.
IMPRESSION: Slight increase in interstitial prominence since prior exam
suggesting bronchitic change. Otherwise negative exam.

## 2016-07-31 MED ORDER — METHOCARBAMOL 500 MG PO TABS: 500.0000 mg | ORAL_TABLET | Freq: Two times a day (BID) | ORAL | 0 refills | Status: DC

## 2016-07-31 MED ORDER — HYDROCODONE-ACETAMINOPHEN 5-325 MG PO TABS: 2.0000 | ORAL_TABLET | ORAL | 0 refills | Status: DC | PRN

## 2016-07-31 MED ORDER — METHOCARBAMOL 500 MG PO TABS
500.0000 mg | ORAL_TABLET | Freq: Once | ORAL | Status: AC
  Administered 2016-07-31: 500 mg via ORAL
  Filled 2016-07-31: qty 1

## 2016-07-31 MED ORDER — HYDROCODONE-ACETAMINOPHEN 5-325 MG PO TABS
2.0000 | ORAL_TABLET | Freq: Once | ORAL | Status: AC
  Administered 2016-07-31: 2 via ORAL
  Filled 2016-07-31: qty 2

## 2016-07-31 NOTE — ED Notes (Signed)
Confirmed with Philippa Chester in [place] that pt's troponin is 0.00

## 2016-07-31 NOTE — ED Triage Notes (Signed)
Per [facility], pt from home, called out for chest pain, but pt c/o back pain generalized x2 days. Woke up this morning having pain in L side of her chest, states she feels like its coming from her back. Pt given 324 asa. Pain worse with movement, palpation. EKG shows inverted T waves.

## 2016-07-31 NOTE — ED Notes (Signed)
Pt stable, understands discharge instructions, and reasons for return.   

## 2016-07-31 NOTE — ED Notes (Signed)
Pt only hx if HTN, pt hasn't been taking meds x1 week.

## 2016-07-31 NOTE — ED Notes (Addendum)
Pt using profanity, during vitals reassessment. Pt is upset, due to wait time. Pt informed of wait times, but stated that we "aren't doing anything". She stated that "she was brought in by EMS and placed in the waiting room". Pt very irate!  Informed Katie - RN.

## 2016-07-31 NOTE — ED Notes (Signed)
Pt seen ambulating to bathroom with steady gait.

## 2016-08-01 NOTE — ED Provider Notes (Signed)
[facility] DEPT Provider Note   CSN: [id number] Arrival date & time: 07/31/16  1641     History   Chief Complaint Chief Complaint  Patient presents with  . Chest Pain  . Back Pain    HPI Roberta Calderon is a 45 y.o. female.  The history is provided by the patient. No language interpreter was used.  Chest Pain   This is a new problem. The problem has not changed since onset.The pain is moderate. The quality of the pain is described as pressure-like. The pain radiates to the upper back. Associated symptoms include back pain. The treatment provided moderate relief.  Pertinent negatives for past medical history include no spontaneous pneumothorax.  Her family medical history is significant for heart disease.  Back Pain   Associated symptoms include chest pain.  Pt reported chest pain to nurse and EMS.  Pt tells me her pain is in her back.   Pt reports she has had this before. Pt reports she needs pain medication for back pain.    Past Medical History:  Diagnosis Date  . Hypertension     There are no active problems to display for this patient.   History reviewed. No pertinent surgical history.  OB History    No data available       Home Medications    Prior to Admission medications   Medication Sig Start Date End Date Taking? Authorizing Provider  hydrochlorothiazide (HYDRODIURIL) 25 MG tablet Take 1 tablet (25 mg total) by mouth daily. 02/29/16  Yes [name], PA-C  HYDROcodone-acetaminophen (NORCO/VICODIN) 5-325 MG tablet Take 2 tablets by mouth every 4 (four) hours as needed. 07/31/16   [name], PA-C  methocarbamol (ROBAXIN) 500 MG tablet Take 1 tablet (500 mg total) by mouth 2 (two) times daily. 07/31/16   [name], PA-C    Family History No family history on file.  Social History Social History  Substance Use Topics  . Smoking status: Current Every Day Smoker    Packs/day: 0.25    Types: Cigarettes  . Smokeless tobacco: Not  on file  . Alcohol use Yes     Allergies   Ibuprofen and Nsaids   Review of Systems Review of Systems  Cardiovascular: Positive for chest pain.  Musculoskeletal: Positive for back pain.  All other systems reviewed and are negative.    Physical Exam Updated Vital Signs BP 131/94   Pulse 94   Temp 98.2 F (36.8 C) (Oral)   Resp 18   Ht 5\' 2"  (1.575 m)   Wt 62.1 kg   LMP 07/27/2016   SpO2 100%   BMI 25.06 kg/m   Physical Exam  Constitutional: She is oriented to person, place, and time. She appears well-developed and well-nourished.  HENT:  Head: Normocephalic.  Eyes: EOM are normal.  Neck: Normal range of motion.  Pulmonary/Chest: Effort normal.  Abdominal: She exhibits no distension.  Musculoskeletal: Normal range of motion.  Neurological: She is alert and oriented to person, place, and time.  Psychiatric: She has a normal mood and affect.  Nursing note and vitals reviewed.    ED Treatments / Results  Labs (all labs ordered are listed, but only abnormal results are displayed) Labs Reviewed  CBC - Abnormal; Notable for the following:       Result Value   RBC 5.15 (*)    All other components within normal limits  I-STAT TROPOININ, ED - Abnormal; Notable for the following:    Troponin  i, poc 0.15 (*)    All other components within normal limits  BASIC METABOLIC PANEL  I-STAT TROPOININ, ED    EKG  EKG Interpretation None       Radiology Dg Chest 2 View  Result Date: 07/31/2016 CLINICAL DATA:  Left-sided chest pain and back pain radiating down left arm. Cough x2 days. Dyspnea while lying flat. EXAM: CHEST  2 VIEW COMPARISON:  [place] FINDINGS: The heart size and mediastinal contours are within normal limits. Mild increase in interstitial prominence may reflect bronchitic change. No pneumonic consolidation or CHF. No effusion. The visualized skeletal structures are unremarkable. IMPRESSION: Slight increase in interstitial prominence since prior exam  suggesting bronchitic change. Otherwise negative exam. Electronically Signed   By: Ashley Royalty M.D.   On: 07/31/2016 17:41    Procedures Procedures (including critical care time)  Medications Ordered in ED Medications  HYDROcodone-acetaminophen (NORCO/VICODIN) 5-325 MG per tablet 2 tablet (2 tablets Oral Given 07/31/16 2033)  methocarbamol (ROBAXIN) tablet 500 mg (500 mg Oral Given 07/31/16 2033)     Initial Impression / Assessment and Plan / ED Course  I have reviewed the triage vital signs and the nursing notes.  Pertinent labs & imaging results that were available during my care of the patient were reviewed by me and considered in my medical decision making (see chart for details).  Clinical Course as of Dec 06 0001  Tue Jul 31, 2016  2001 ED EKG within 10 minutes [LS]  2002 EKG 12-Lead [LS]    Clinical Course User Index [LS] [name], PA-C    (Pt had a troponin of 0.15 resulted.   This belonged to a different pt,  RN and I spoke with minilab and they will make correction.   Final Clinical Impressions(s) / ED Diagnoses   Final diagnoses:  Acute right-sided low back pain without sciatica    New Prescriptions Discharge Medication List as of 07/31/2016 11:38 PM    An After Visit Summary was printed and given to the patient. Meds ordered this encounter  Medications  . HYDROcodone-acetaminophen (NORCO/VICODIN) 5-325 MG per tablet 2 tablet  . methocarbamol (ROBAXIN) tablet 500 mg  . methocarbamol (ROBAXIN) 500 MG tablet    Sig: Take 1 tablet (500 mg total) by mouth 2 (two) times daily.    Dispense:  20 tablet    Refill:  0    Order Specific Question:   Supervising Provider    Answer:   Lajean Saver [[id number]]  . HYDROcodone-acetaminophen (NORCO/VICODIN) 5-325 MG tablet    Sig: Take 2 tablets by mouth every 4 (four) hours as needed.    Dispense:  16 tablet    Refill:  0    Order Specific Question:   Supervising Provider    Answer:   Lajean Saver [place], PA-C 08/01/16 0040    Gwenyth Allegra Tegeler, MD 08/01/16 [phone]

## 2016-08-03 ENCOUNTER — Telehealth: Payer: Self-pay | Admitting: *Deleted

## 2016-08-03 NOTE — Telephone Encounter (Signed)
Pt called to see if she was still enrolled in the [facility] program.  [facility] reviewed chart to find that pt was not enrolled in [facility] but is prescribed BP Rx with very low co-pay and a nracotic.  Advised pt to take Rx to [facility] for help with BP Rx but narcotics will not be covered under any medication assistance program.  Pt verbalized understanding of directions.

## 2017-05-01 ENCOUNTER — Emergency Department (HOSPITAL_COMMUNITY)
Admission: EM | Admit: 2017-05-01 | Discharge: 2017-05-01 | Disposition: A | Discharge status: Home / Self Care | Payer: Self-pay | Attending: Emergency Medicine | Admitting: Emergency Medicine

## 2017-05-01 ENCOUNTER — Encounter (HOSPITAL_COMMUNITY): Payer: Self-pay | Admitting: *Deleted

## 2017-05-01 DIAGNOSIS — B351 Tinea unguium: Secondary | ICD-10-CM

## 2017-05-01 DIAGNOSIS — I10 Essential (primary) hypertension: Secondary | ICD-10-CM | POA: Insufficient documentation

## 2017-05-01 DIAGNOSIS — Z79899 Other long term (current) drug therapy: Secondary | ICD-10-CM | POA: Insufficient documentation

## 2017-05-01 DIAGNOSIS — F17210 Nicotine dependence, cigarettes, uncomplicated: Secondary | ICD-10-CM | POA: Insufficient documentation

## 2017-05-01 DIAGNOSIS — M79675 Pain in left toe(s): Secondary | ICD-10-CM | POA: Insufficient documentation

## 2017-05-01 NOTE — ED Notes (Addendum)
PT requested a cane or crutches, PA aware. No new orders. Wheeled to lobby and pt ambulated with a steady gate

## 2017-05-01 NOTE — ED Triage Notes (Signed)
Pt reports pain to all toes bilateral. Denies any injury. Pain started over past week and becoming more difficult to ambulate. Pt is ambulatory at triage.

## 2017-05-01 NOTE — ED Notes (Signed)
Bilateral feet wrapped for patient comfort with ace wrap.

## 2017-05-01 NOTE — ED Provider Notes (Signed)
[facility] DEPT Provider Note   CSN: [id number] Arrival date & time: 05/01/17  6387     History   Chief Complaint Chief Complaint  Patient presents with  . Foot Pain    HPI Roberta Calderon is a 46 y.o. female who presents with pain to all of her toenails. It has been like this "for a while". She has had increasing difficulty walking due to pain. She states that her toenails are falling off. No treatments were tried prior to arrival.  HPI  Past Medical History:  Diagnosis Date  . Hypertension     There are no active problems to display for this patient.   History reviewed. No pertinent surgical history.  OB History    No data available       Home Medications    Prior to Admission medications   Medication Sig Start Date End Date Taking? Authorizing Provider  hydrochlorothiazide (HYDRODIURIL) 25 MG tablet Take 1 tablet (25 mg total) by mouth daily. 02/29/16   [name], PA-C  HYDROcodone-acetaminophen (NORCO/VICODIN) 5-325 MG tablet Take 2 tablets by mouth every 4 (four) hours as needed. 07/31/16   [name], PA-C  methocarbamol (ROBAXIN) 500 MG tablet Take 1 tablet (500 mg total) by mouth 2 (two) times daily. 07/31/16   [name], PA-C    Family History History reviewed. No pertinent family history.  Social History Social History  Substance Use Topics  . Smoking status: Current Every Day Smoker    Packs/day: 0.25    Types: Cigarettes  . Smokeless tobacco: Not on file  . Alcohol use Yes     Allergies   Ibuprofen and Nsaids   Review of Systems Review of Systems  Musculoskeletal:       +toenail pain  Skin: Positive for color change.     Physical Exam Updated Vital Signs BP (!) 172/99 (BP Location: Left Arm)   Pulse 92   Temp 98.2 F (36.8 C) (Oral)   Resp 16   LMP 04/12/2017   SpO2 98%   Physical Exam  Constitutional: She is oriented to person, place, and time. She appears well-developed and well-nourished. No  distress.  HENT:  Head: Normocephalic and atraumatic.  Eyes: Pupils are equal, round, and reactive to light. Conjunctivae are normal. Right eye exhibits no discharge. Left eye exhibits no discharge. No scleral icterus.  Neck: Normal range of motion.  Cardiovascular: Normal rate.   Pulmonary/Chest: Effort normal. No respiratory distress.  Abdominal: She exhibits no distension.  Musculoskeletal:  Ambulatory without difficulty  Neurological: She is alert and oriented to person, place, and time.  Skin: Skin is warm and dry.  Onychomycosis noted to toenails bilaterally  Psychiatric: She has a normal mood and affect. Her behavior is normal.  Nursing note and vitals reviewed.    ED Treatments / Results  Labs (all labs ordered are listed, but only abnormal results are displayed) Labs Reviewed - No data to display  EKG  EKG Interpretation None       Radiology No results found.  Procedures Procedures (including critical care time)  Medications Ordered in ED Medications - No data to display   Initial Impression / Assessment and Plan / ED Course  I have reviewed the triage vital signs and the nursing notes.  Pertinent labs & imaging results that were available during my care of the patient were reviewed by me and considered in my medical decision making (see chart for details).  46 year old female with fungal infection  of her toenails. She is asking to have her toenails removed. I declined. I advised her to follow up with podiatry and keep feet clean and dry.  Final Clinical Impressions(s) / ED Diagnoses   Final diagnoses:  Onychomycosis    New Prescriptions Discharge Medication List as of 05/01/2017 10:15 AM       Recardo Evangelist, PA-C 05/01/17 1126    Nat Christen, MD 05/04/17 [phone]

## 2017-05-01 NOTE — Discharge Instructions (Signed)
Please keep feet as dry as possible  Follow up with podiatry

## 2017-05-14 ENCOUNTER — Ambulatory Visit: Payer: Self-pay

## 2017-05-24 ENCOUNTER — Ambulatory Visit: Payer: Self-pay

## 2020-04-05 ENCOUNTER — Inpatient Hospital Stay (HOSPITAL_COMMUNITY)
Admission: EM | Admit: 2020-04-05 | Discharge: 2020-04-08 | DRG: 065 | Disposition: A | Discharge status: Rehabilitation Facility | Payer: Self-pay | Attending: Internal Medicine | Admitting: Internal Medicine

## 2020-04-05 ENCOUNTER — Emergency Department (HOSPITAL_COMMUNITY): Payer: Self-pay

## 2020-04-05 ENCOUNTER — Other Ambulatory Visit: Payer: Self-pay

## 2020-04-05 ENCOUNTER — Encounter (HOSPITAL_COMMUNITY): Payer: Self-pay | Admitting: *Deleted

## 2020-04-05 DIAGNOSIS — I69354 Hemiplegia and hemiparesis following cerebral infarction affecting left non-dominant side: Secondary | ICD-10-CM

## 2020-04-05 DIAGNOSIS — M25552 Pain in left hip: Secondary | ICD-10-CM | POA: Diagnosis present

## 2020-04-05 DIAGNOSIS — F1490 Cocaine use, unspecified, uncomplicated: Secondary | ICD-10-CM | POA: Diagnosis present

## 2020-04-05 DIAGNOSIS — H538 Other visual disturbances: Secondary | ICD-10-CM | POA: Diagnosis present

## 2020-04-05 DIAGNOSIS — I6340 Cerebral infarction due to embolism of unspecified cerebral artery: Principal | ICD-10-CM | POA: Diagnosis present

## 2020-04-05 DIAGNOSIS — Z8249 Family history of ischemic heart disease and other diseases of the circulatory system: Secondary | ICD-10-CM

## 2020-04-05 DIAGNOSIS — F17210 Nicotine dependence, cigarettes, uncomplicated: Secondary | ICD-10-CM | POA: Diagnosis present

## 2020-04-05 DIAGNOSIS — R29703 NIHSS score 3: Secondary | ICD-10-CM | POA: Diagnosis present

## 2020-04-05 DIAGNOSIS — M542 Cervicalgia: Secondary | ICD-10-CM | POA: Diagnosis present

## 2020-04-05 DIAGNOSIS — F1010 Alcohol abuse, uncomplicated: Secondary | ICD-10-CM | POA: Diagnosis present

## 2020-04-05 DIAGNOSIS — I10 Essential (primary) hypertension: Secondary | ICD-10-CM | POA: Diagnosis present

## 2020-04-05 DIAGNOSIS — R7303 Prediabetes: Secondary | ICD-10-CM | POA: Diagnosis present

## 2020-04-05 DIAGNOSIS — Z20822 Contact with and (suspected) exposure to covid-19: Secondary | ICD-10-CM | POA: Diagnosis present

## 2020-04-05 DIAGNOSIS — E785 Hyperlipidemia, unspecified: Secondary | ICD-10-CM | POA: Diagnosis present

## 2020-04-05 DIAGNOSIS — Z590 Homelessness: Secondary | ICD-10-CM

## 2020-04-05 DIAGNOSIS — F17200 Nicotine dependence, unspecified, uncomplicated: Secondary | ICD-10-CM

## 2020-04-05 DIAGNOSIS — I639 Cerebral infarction, unspecified: Principal | ICD-10-CM

## 2020-04-05 DIAGNOSIS — Z79899 Other long term (current) drug therapy: Secondary | ICD-10-CM

## 2020-04-05 DIAGNOSIS — R29810 Facial weakness: Secondary | ICD-10-CM | POA: Diagnosis present

## 2020-04-05 DIAGNOSIS — F1910 Other psychoactive substance abuse, uncomplicated: Secondary | ICD-10-CM

## 2020-04-05 DIAGNOSIS — G8194 Hemiplegia, unspecified affecting left nondominant side: Secondary | ICD-10-CM | POA: Diagnosis present

## 2020-04-05 DIAGNOSIS — D45 Polycythemia vera: Secondary | ICD-10-CM | POA: Diagnosis present

## 2020-04-05 DIAGNOSIS — F40240 Claustrophobia: Secondary | ICD-10-CM | POA: Diagnosis present

## 2020-04-05 DIAGNOSIS — Z886 Allergy status to analgesic agent status: Secondary | ICD-10-CM

## 2020-04-05 LAB — I-STAT CHEM 8, ED
BUN: 11 mg/dL (ref 6–20)
Calcium, Ion: 1.06 mmol/L — ABNORMAL LOW (ref 1.15–1.40)
Chloride: 104 mmol/L (ref 98–111)
Creatinine, Ser: 0.8 mg/dL (ref 0.44–1.00)
Glucose, Bld: 107 mg/dL — ABNORMAL HIGH (ref 70–99)
HCT: 51 % — ABNORMAL HIGH (ref 36.0–46.0)
Hemoglobin: 17.3 g/dL — ABNORMAL HIGH (ref 12.0–15.0)
Potassium: 3.7 mmol/L (ref 3.5–5.1)
Sodium: 139 mmol/L (ref 135–145)
TCO2: 27 mmol/L (ref 22–32)

## 2020-04-05 LAB — CBC
HCT: 48.7 % — ABNORMAL HIGH (ref 36.0–46.0)
Hemoglobin: 15.2 g/dL — ABNORMAL HIGH (ref 12.0–15.0)
MCH: 26.2 pg (ref 26.0–34.0)
MCHC: 31.2 g/dL (ref 30.0–36.0)
MCV: 84 fL (ref 80.0–100.0)
Platelets: 344 10*3/uL (ref 150–400)
RBC: 5.8 MIL/uL — ABNORMAL HIGH (ref 3.87–5.11)
RDW: 14.2 % (ref 11.5–15.5)
WBC: 5.5 10*3/uL (ref 4.0–10.5)
nRBC: 0 % (ref 0.0–0.2)

## 2020-04-05 LAB — DIFFERENTIAL
Basophils Absolute: 0 10*3/uL (ref 0.0–0.1)
Basophils Relative: 0 %
Eosinophils Absolute: 0.2 10*3/uL (ref 0.0–0.5)
Eosinophils Relative: 3 %
Lymphocytes Relative: 40 %
Lymphs Abs: 2.2 10*3/uL (ref 0.7–4.0)
Monocytes Absolute: 0.5 10*3/uL (ref 0.1–1.0)
Monocytes Relative: 9 %
Neutro Abs: 2.6 10*3/uL (ref 1.7–7.7)
Neutrophils Relative %: 48 %

## 2020-04-05 LAB — COMPREHENSIVE METABOLIC PANEL
ALT: 14 U/L (ref 0–44)
AST: 23 U/L (ref 15–41)
Albumin: 3.9 g/dL (ref 3.5–5.0)
Alkaline Phosphatase: 73 U/L (ref 38–126)
Anion gap: 14 (ref 5–15)
BUN: 10 mg/dL (ref 6–20)
CO2: 20 mmol/L — ABNORMAL LOW (ref 22–32)
Calcium: 9.6 mg/dL (ref 8.9–10.3)
Chloride: 103 mmol/L (ref 98–111)
Creatinine, Ser: 0.86 mg/dL (ref 0.44–1.00)
GFR calc Af Amer: >60 mL/min (ref >60)
GFR calc non Af Amer: >60 mL/min (ref >60)
Glucose, Bld: 108 mg/dL — ABNORMAL HIGH (ref 70–99)
Potassium: 3.8 mmol/L (ref 3.5–5.1)
Sodium: 137 mmol/L (ref 135–145)
Total Bilirubin: 0.9 mg/dL (ref 0.3–1.2)
Total Protein: 7.4 g/dL (ref 6.5–8.1)

## 2020-04-05 LAB — I-STAT BETA HCG BLOOD, ED (MC, WL, AP ONLY): I-stat hCG, quantitative: <5 m[IU]/mL (ref <5)

## 2020-04-05 IMAGING — CT CT HEAD W/O CM
4 series · 17 of 47 positions shown, 19 images · non-contrast
Comparison: None.

CLINICAL DATA: Left-sided weakness for 1 week

EXAM:
CT HEAD WITHOUT CONTRAST
TECHNIQUE: Contiguous axial images were obtained from the base of the skull
through the vertex without intravenous contrast.

[Series 3: head wo · axial · 0.39mm/px · z∈[+1072,+1192]mm · 7 of 34 slices shown, 9 images]
[im 5/34  brain]
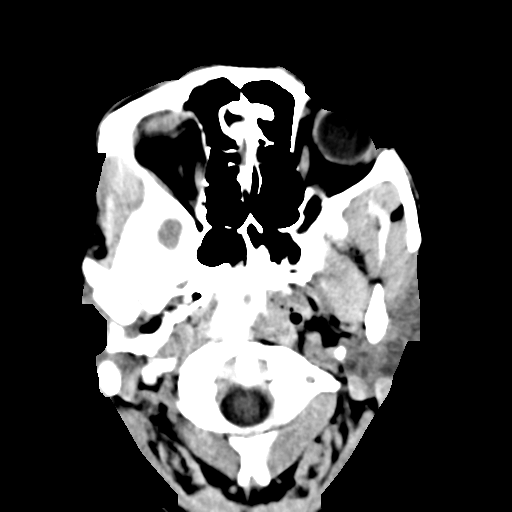
[im 5/34  bone]
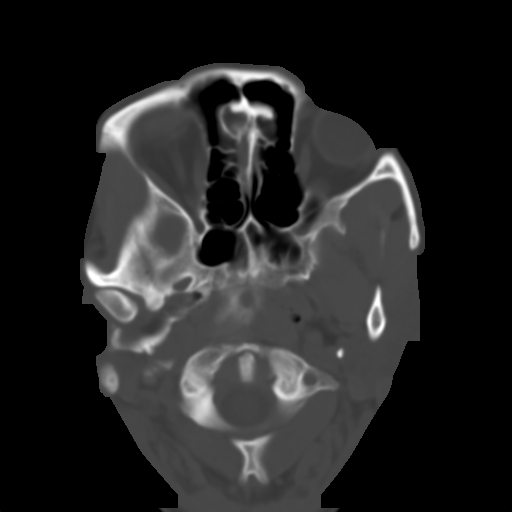
[im 9/34  brain]
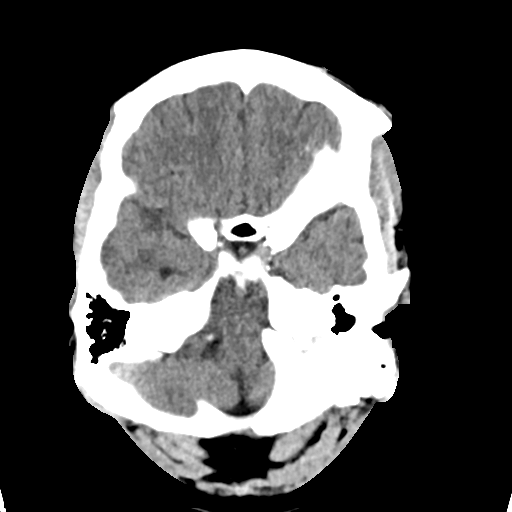
[im 13/34  brain]
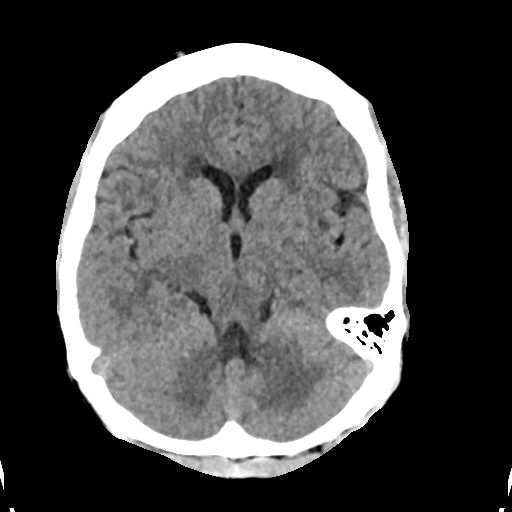
[im 17/34  brain]
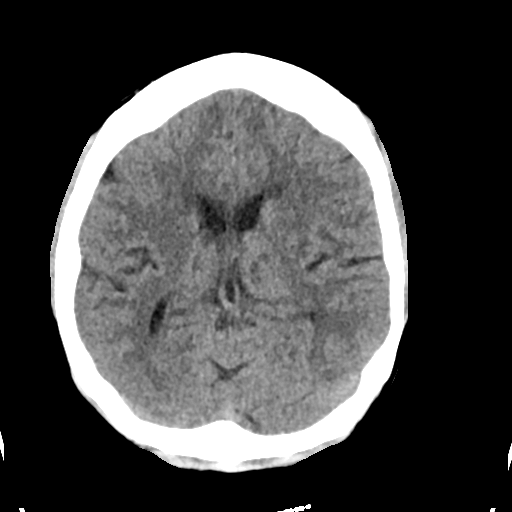
[im 21/34  brain]
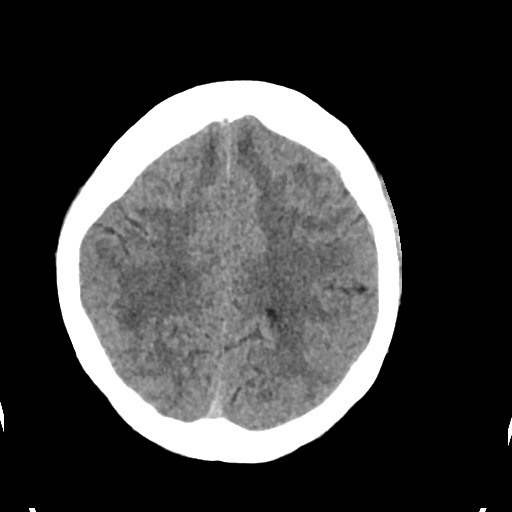
[im 21/34  bone]
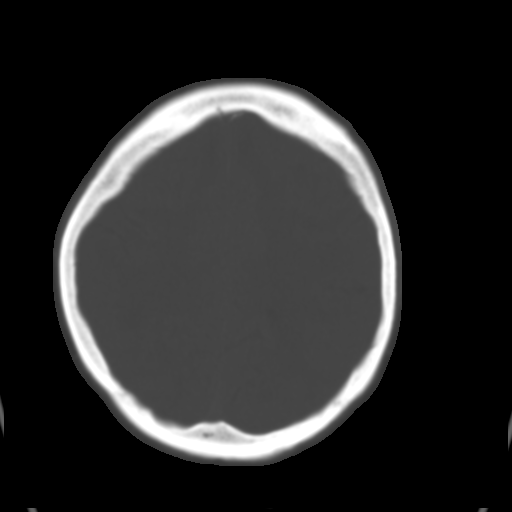
[im 25/34  brain]
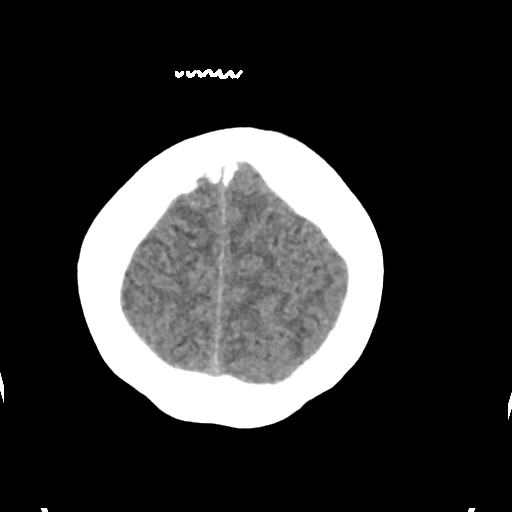
[im 29/34  brain]
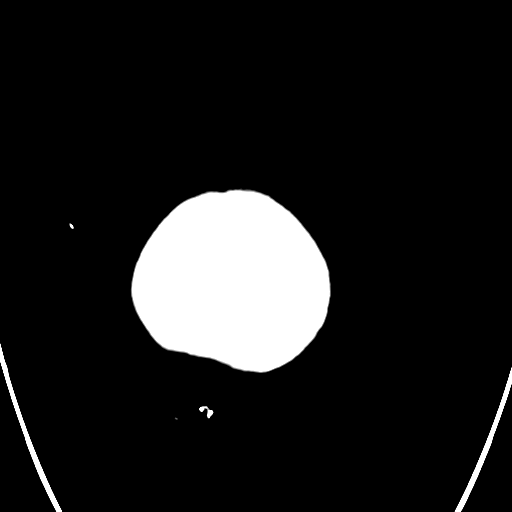

[Series 4: head bone · axial · 0.39mm/px · z∈[+1068,+1126]mm · 4 of 84 slices shown]
[im 9/84  bone]
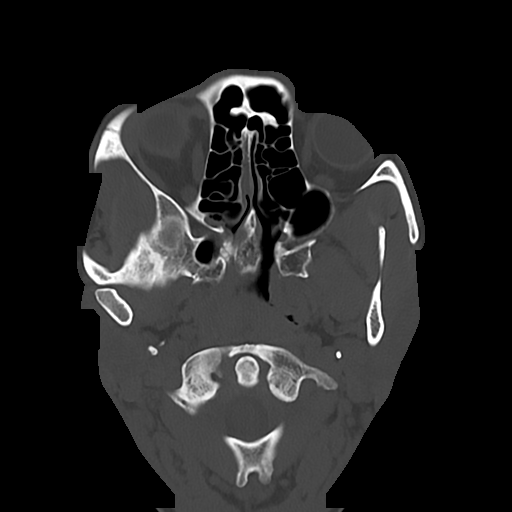
[im 17/84  bone]
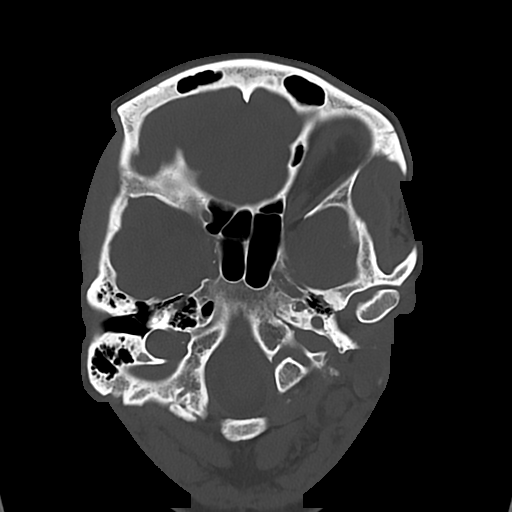
[im 25/84  bone]
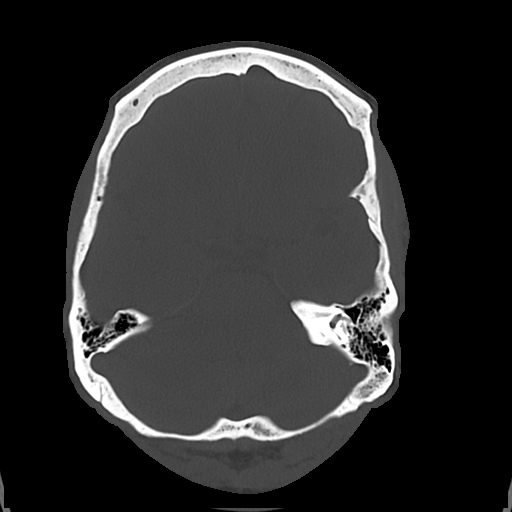
[im 38/84  bone]
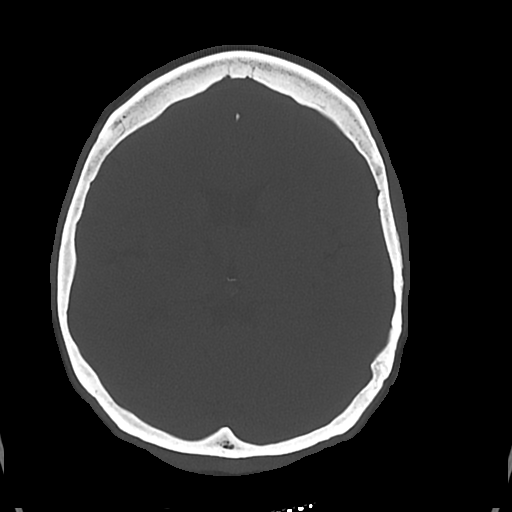

[Series 5: cor soft · coronal · 0.32mm/px · 3 of 64 slices shown]
[im 22/64  brain]
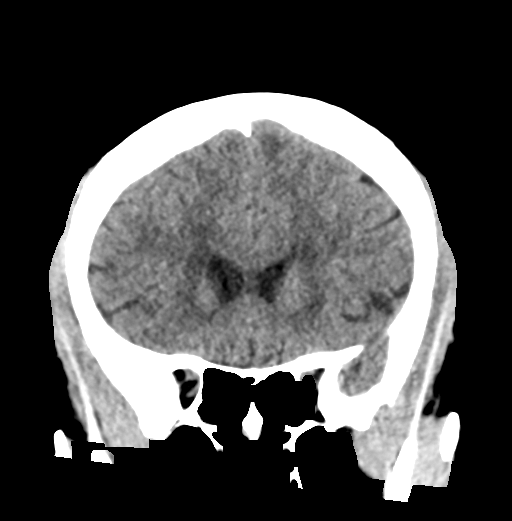
[im 29/64  brain]
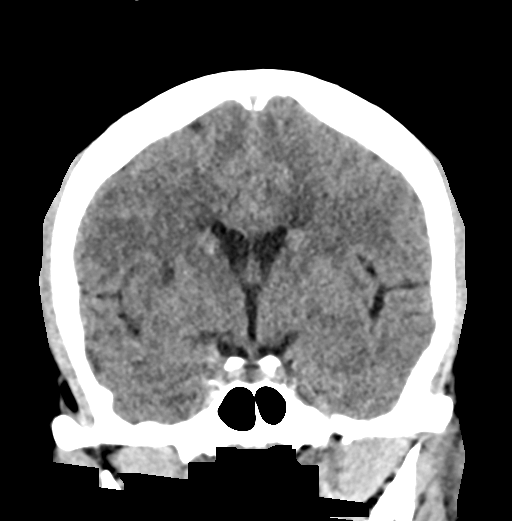
[im 36/64  brain]
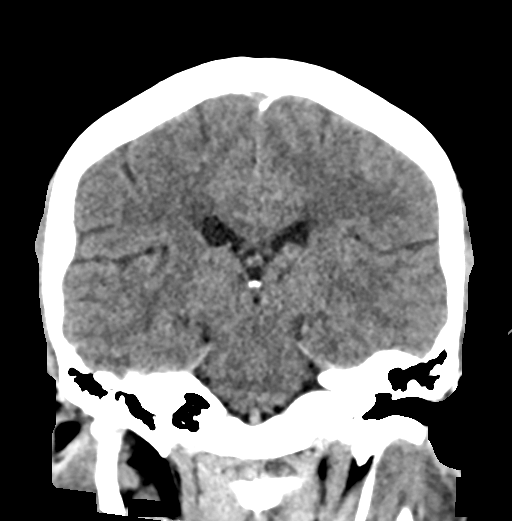

[Series 6: sag soft · sagittal · 0.32mm/px · 3 of 56 slices shown]
[im 19/56  brain]
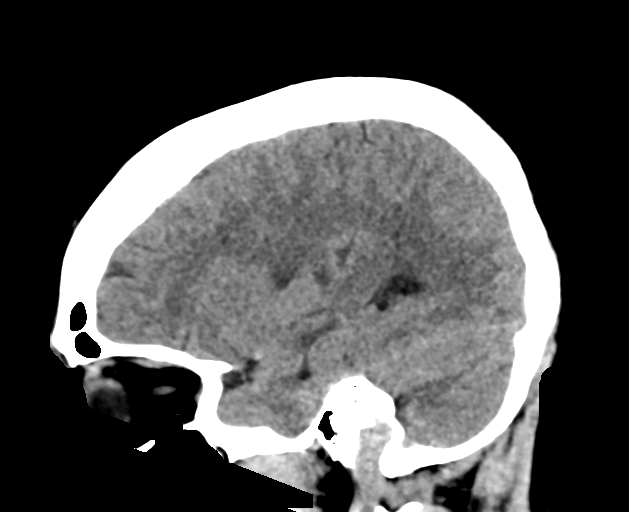
[im 28/56  brain]
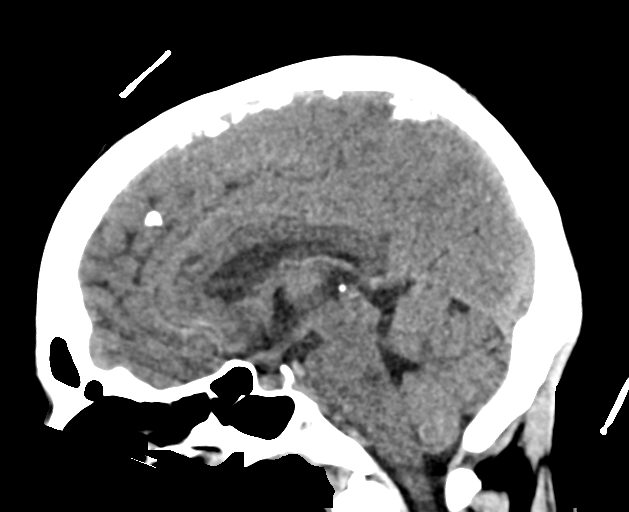
[im 37/56  brain]
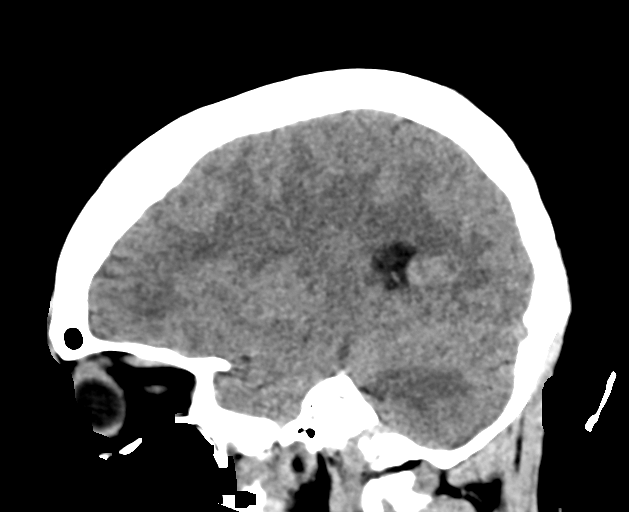

[17 of 47 positions shown; findings below may reference images not displayed]

FINDINGS: Brain: Scattered areas of decreased attenuation are noted involving
the right thalamus, right basal ganglia and right deep white matter
in the parietal lobe consistent with acute to subacute ischemia. No
significant mass effect is noted. No focal mass lesion is seen. No
acute hemorrhage is noted.

Vascular: No hyperdense vessel or unexpected calcification.

Skull: Normal. Negative for fracture or focal lesion.

Sinuses/Orbits: No acute finding.

Other: None.
IMPRESSION: Scattered areas of decreased attenuation on the right as described
consistent with likely subacute ischemia given the clinical history.

## 2020-04-05 MED ORDER — SODIUM CHLORIDE 0.9% FLUSH: 3.0000 mL | Freq: Once | INTRAVENOUS | Status: DC

## 2020-04-05 NOTE — ED Triage Notes (Signed)
Pt is here for weakness to left side for one week and continues to get worse.  Feels like she may have had a stroke.  Left arm has weak grip strength. Drift to left leg

## 2020-04-05 NOTE — ED Notes (Signed)
Pt refused to have vitals taken 

## 2020-04-06 ENCOUNTER — Inpatient Hospital Stay (HOSPITAL_COMMUNITY): Payer: Self-pay

## 2020-04-06 DIAGNOSIS — F17200 Nicotine dependence, unspecified, uncomplicated: Secondary | ICD-10-CM

## 2020-04-06 DIAGNOSIS — I6389 Other cerebral infarction: Secondary | ICD-10-CM

## 2020-04-06 DIAGNOSIS — I639 Cerebral infarction, unspecified: Secondary | ICD-10-CM

## 2020-04-06 DIAGNOSIS — I10 Essential (primary) hypertension: Secondary | ICD-10-CM

## 2020-04-06 LAB — CBC WITH DIFFERENTIAL/PLATELET
Abs Immature Granulocytes: 0.01 10*3/uL (ref 0.00–0.07)
Basophils Absolute: 0 10*3/uL (ref 0.0–0.1)
Basophils Relative: 1 %
Eosinophils Absolute: 0.2 10*3/uL (ref 0.0–0.5)
Eosinophils Relative: 4 %
HCT: 44.8 % (ref 36.0–46.0)
Hemoglobin: 13.6 g/dL (ref 12.0–15.0)
Immature Granulocytes: 0 %
Lymphocytes Relative: 43 %
Lymphs Abs: 2 10*3/uL (ref 0.7–4.0)
MCH: 25.4 pg — ABNORMAL LOW (ref 26.0–34.0)
MCHC: 30.4 g/dL (ref 30.0–36.0)
MCV: 83.7 fL (ref 80.0–100.0)
Monocytes Absolute: 0.3 10*3/uL (ref 0.1–1.0)
Monocytes Relative: 7 %
Neutro Abs: 2.1 10*3/uL (ref 1.7–7.7)
Neutrophils Relative %: 45 %
Platelets: 299 10*3/uL (ref 150–400)
RBC: 5.35 MIL/uL — ABNORMAL HIGH (ref 3.87–5.11)
RDW: 14.3 % (ref 11.5–15.5)
WBC: 4.6 10*3/uL (ref 4.0–10.5)
nRBC: 0 % (ref 0.0–0.2)

## 2020-04-06 LAB — SEDIMENTATION RATE: Sed Rate: 5 mm/hr (ref 0–22)

## 2020-04-06 LAB — LIPID PANEL
Cholesterol: 254 mg/dL — ABNORMAL HIGH (ref 0–200)
HDL: 62 mg/dL (ref >40)
LDL Cholesterol: 173 mg/dL — ABNORMAL HIGH (ref 0–99)
Total CHOL/HDL Ratio: 4.1 RATIO
Triglycerides: 95 mg/dL (ref <150)
VLDL: 19 mg/dL (ref 0–40)

## 2020-04-06 LAB — ECHOCARDIOGRAM COMPLETE BUBBLE STUDY
Area-P 1/2: 1.86 cm2
Calc EF: 78.4 %
S' Lateral: 2.1 cm
Single Plane A2C EF: 79.1 %
Single Plane A4C EF: 79 %

## 2020-04-06 LAB — SARS CORONAVIRUS 2 BY RT PCR (HOSPITAL ORDER, PERFORMED IN ~~LOC~~ HOSPITAL LAB): SARS Coronavirus 2: NEGATIVE

## 2020-04-06 LAB — HIV ANTIBODY (ROUTINE TESTING W REFLEX): HIV Screen 4th Generation wRfx: NONREACTIVE

## 2020-04-06 LAB — TSH: TSH: 0.887 u[IU]/mL (ref 0.350–4.500)

## 2020-04-06 LAB — HEMOGLOBIN A1C
Hgb A1c MFr Bld: 6.3 % — ABNORMAL HIGH (ref 4.8–5.6)
Mean Plasma Glucose: 134.11 mg/dL

## 2020-04-06 IMAGING — CT CT ANGIO NECK
2 of 11 series · 7 of 33 positions shown · IV contrast (omnipaque)
Comparison: Head CT [DATE]

CLINICAL DATA: Neuro deficit, acute, stroke suspected. Additional
history provided: Patient presents with weakness to left side for 1
week, worsening, left arm with weak grip strength, drift to the
left.

EXAM:
CT ANGIOGRAPHY HEAD AND NECK
TECHNIQUE: Multidetector CT imaging of the head and neck was performed using
the standard protocol during bolus administration of intravenous
contrast. Multiplanar CT image reconstructions and MIPs were
obtained to evaluate the vascular anatomy. Carotid stenosis
measurements (when applicable) are obtained utilizing NASCET
criteria, using the distal internal carotid diameter as the
denominator.
CONTRAST:  75mL OMNIPAQUE IOHEXOL 350 MG/ML SOLN

[Series 9: cta neck · axial · 0.40mm/px · z∈[-168,-58]mm · 2 of 166 slices shown]
[im 56/166  soft-tissue]
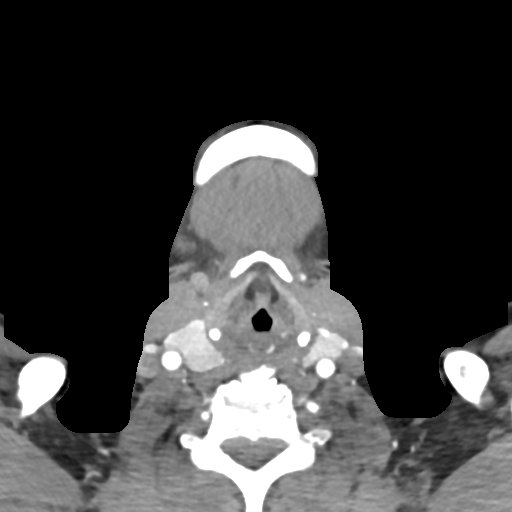
[im 111/166  bone]
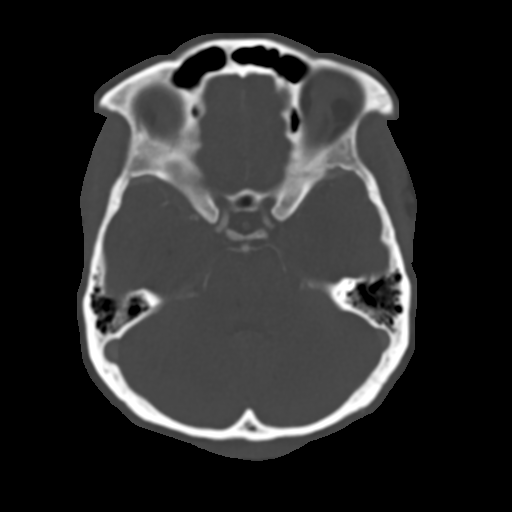

[Series 11: cta neck axial · axial · 0.39mm/px · z∈[-257,-60]mm · 5 of 329 slices shown]
[im 55/329  soft-tissue]
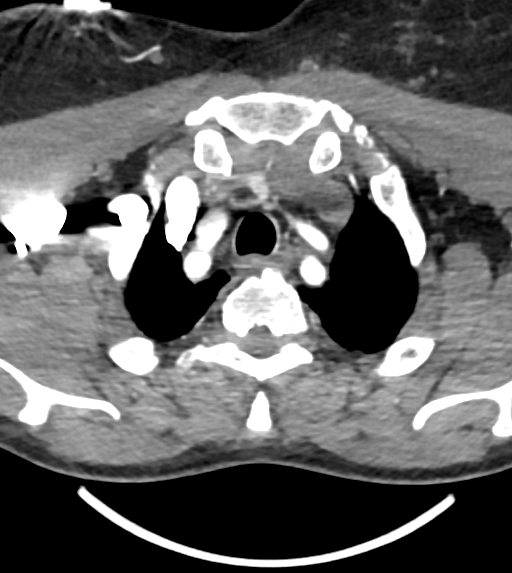
[im 110/329  soft-tissue]
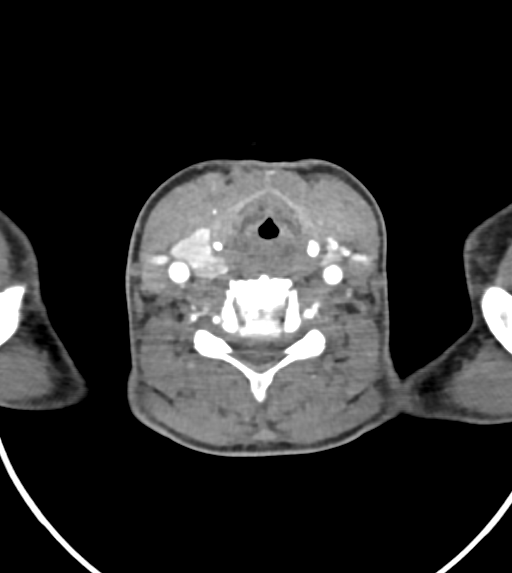
[im 165/329  soft-tissue]
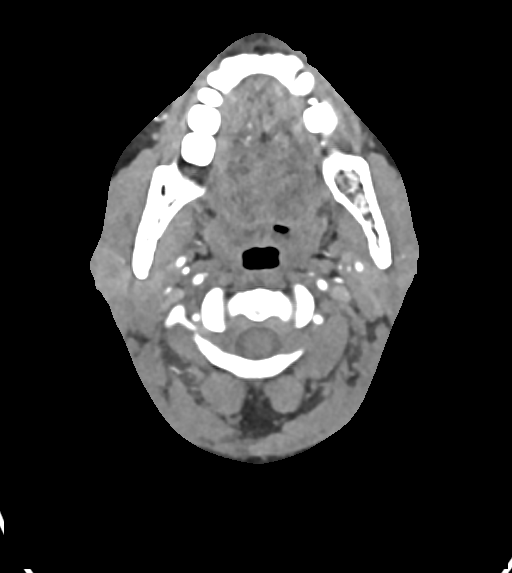
[im 219/329  soft-tissue]
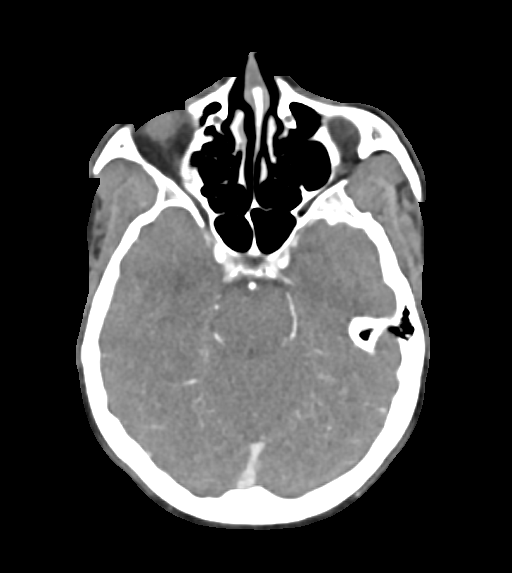
[im 274/329  soft-tissue]
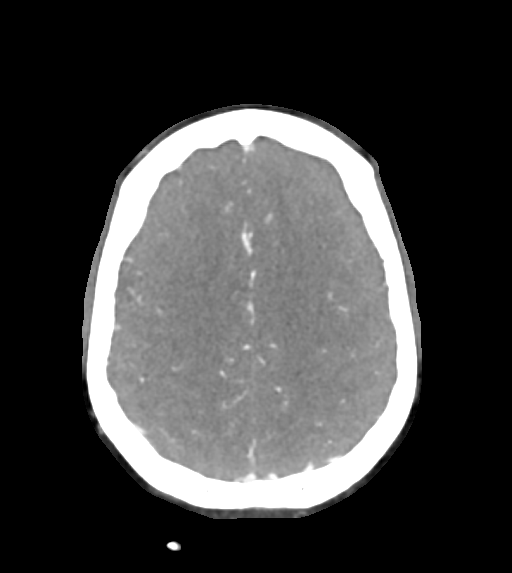

[7 of 33 positions shown; findings below may reference images not displayed]

FINDINGS: CT HEAD FINDINGS

Brain:

Redemonstrated ischemic infarction changes within the right thalamus
and thalamocapsular junction, possibly subacute. Redemonstrated
age-indeterminate lacunar infarcts within the basal ganglia
bilaterally.

Unchanged chronic lacunar infarct within the left thalamus and
paramedian posterior right pons.

Mild background ill-defined hypoattenuation within cerebral white
matter is advanced for age and nonspecific, but consistent with
chronic small vessel ischemic disease.

There is no acute intracranial hemorrhage.

No demarcated cortical infarct is identified.

No extra-axial fluid collection.

No evidence of intracranial mass.

No midline shift.

Vascular: Reported below.

Skull: Normal. Negative for fracture or focal lesion.

Sinuses: No significant paranasal sinus disease or mastoid effusion

Orbits: No acute orbital abnormality identified.

Review of the MIP images confirms the above findings

CTA NECK FINDINGS

Aortic arch: Standard aortic branching. Minimal calcified plaque
within the visualized aortic arch and proximal major branch vessels
of the neck. No hemodynamically significant innominate or proximal
subclavian artery stenosis.

Right carotid system: CCA and ICA patent within the neck without
significant stenosis (50% or greater). Mild calcified plaque at the
carotid bifurcation.

Left carotid system: CCA and ICA patent within the neck without
significant stenosis (50% or greater). Mild calcified plaque at the
carotid bifurcation.

Vertebral arteries: Codominant and patent within the neck without
significant stenosis.

Skeleton: No acute bony abnormality or aggressive osseous lesion.
Advanced for age cervical spondylosis with multilevel disc space
narrowing, posterior disc osteophytes, uncovertebral and facet
hypertrophy.

Other neck: No neck mass or cervical lymphadenopathy

Upper chest: No consolidation within the imaged lung apices.

Review of the MIP images confirms the above findings

CTA HEAD FINDINGS

Anterior circulation:

The intracranial internal carotid arteries are patent. Calcified
plaque within both vessels with no more than mild stenosis.

The M1 middle cerebral arteries are patent without significant
stenosis. No M2 proximal branch occlusion or high-grade proximal
stenosis is identified.

The anterior cerebral arteries are patent.

No intracranial aneurysm is identified.

Posterior circulation:

The intracranial vertebral arteries are patent. The basilar artery
is patent. The posterior cerebral arteries are patent. Posterior
communicating arteries are hypoplastic or absent bilaterally.

Venous sinuses: Within limitations of contrast timing, no convincing
thrombus.

Anatomic variants: As described

Review of the MIP images confirms the above findings
IMPRESSION: CT head:

1. Redemonstrated ischemic infarction changes within the right
thalamus and thalamocapsular junction, possibly subacute. Consider
brain MRI for further evaluation.
2. Redemonstrated age-indeterminate lacunar infarcts within the
basal ganglia bilaterally.
3. Unchanged chronic pontine and left thalamic lacunar infarcts.
4. Mild background cerebral white matter chronic small vessel
ischemic disease, which is advanced for age.

CTA neck:

1. The common carotid, internal carotid and vertebral arteries are
patent within the neck without hemodynamically significant stenosis.
Mild calcified plaque within the visualized aortic arch, proximal
major branch vessels of the neck and carotid bifurcations.

CTA head:

1. No intracranial large vessel occlusion or proximal high-grade
arterial stenosis.
2. Mild calcified plaque within both intracranial internal carotid
arteries with no more than mild stenosis.

## 2020-04-06 MED ORDER — LORAZEPAM 1 MG PO TABS: 0.0000 mg | ORAL_TABLET | Freq: Two times a day (BID) | ORAL | Status: DC

## 2020-04-06 MED ORDER — LORAZEPAM 2 MG/ML IJ SOLN
0.0000 mg | Freq: Four times a day (QID) | INTRAMUSCULAR | Status: AC
  Administered 2020-04-06: 2 mg via INTRAVENOUS
  Filled 2020-04-06 (×2): qty 1

## 2020-04-06 MED ORDER — LORAZEPAM 2 MG/ML IJ SOLN
2.0000 mg | Freq: Once | INTRAMUSCULAR | Status: AC | PRN
  Administered 2020-04-06: 2 mg via INTRAVENOUS
  Filled 2020-04-06: qty 1

## 2020-04-06 MED ORDER — LACTATED RINGERS IV BOLUS
1000.0000 mL | Freq: Once | INTRAVENOUS | Status: AC
  Administered 2020-04-06: 1000 mL via INTRAVENOUS

## 2020-04-06 MED ORDER — ACETAMINOPHEN 650 MG RE SUPP: 650.0000 mg | RECTAL | Status: DC | PRN

## 2020-04-06 MED ORDER — ACETAMINOPHEN 325 MG PO TABS
650.0000 mg | ORAL_TABLET | ORAL | Status: DC | PRN
  Administered 2020-04-06: 650 mg via ORAL
  Filled 2020-04-06: qty 2

## 2020-04-06 MED ORDER — LABETALOL HCL 5 MG/ML IV SOLN: 5.0000 mg | INTRAVENOUS | Status: DC | PRN

## 2020-04-06 MED ORDER — STROKE: EARLY STAGES OF RECOVERY BOOK
Freq: Once | Status: AC
  Filled 2020-04-06: qty 1

## 2020-04-06 MED ORDER — ACETAMINOPHEN 160 MG/5ML PO SOLN: 650.0000 mg | ORAL | Status: DC | PRN

## 2020-04-06 MED ORDER — LORAZEPAM 1 MG PO TABS: 0.0000 mg | ORAL_TABLET | Freq: Four times a day (QID) | ORAL | Status: AC

## 2020-04-06 MED ORDER — IOHEXOL 350 MG/ML SOLN
75.0000 mL | Freq: Once | INTRAVENOUS | Status: AC | PRN
  Administered 2020-04-06: 75 mL via INTRAVENOUS

## 2020-04-06 MED ORDER — SENNOSIDES-DOCUSATE SODIUM 8.6-50 MG PO TABS: 1.0000 | ORAL_TABLET | Freq: Every evening | ORAL | Status: DC | PRN

## 2020-04-06 MED ORDER — ENOXAPARIN SODIUM 40 MG/0.4ML ~~LOC~~ SOLN
40.0000 mg | SUBCUTANEOUS | Status: DC
  Administered 2020-04-07: 40 mg via SUBCUTANEOUS
  Filled 2020-04-06: qty 0.4

## 2020-04-06 MED ORDER — LORAZEPAM 2 MG/ML IJ SOLN: 0.0000 mg | Freq: Two times a day (BID) | INTRAMUSCULAR | Status: DC

## 2020-04-06 MED ORDER — NICOTINE 7 MG/24HR TD PT24
7.0000 mg | MEDICATED_PATCH | Freq: Every day | TRANSDERMAL | Status: DC
  Administered 2020-04-06 – 2020-04-08 (×3): 7 mg via TRANSDERMAL
  Filled 2020-04-06 (×3): qty 1

## 2020-04-06 MED ORDER — THIAMINE HCL 100 MG/ML IJ SOLN
100.0000 mg | Freq: Every day | INTRAMUSCULAR | Status: DC
  Filled 2020-04-06: qty 2

## 2020-04-06 MED ORDER — THIAMINE HCL 100 MG PO TABS
100.0000 mg | ORAL_TABLET | Freq: Every day | ORAL | Status: DC
  Administered 2020-04-06 – 2020-04-08 (×3): 100 mg via ORAL
  Filled 2020-04-06 (×3): qty 1

## 2020-04-06 MED ORDER — ATORVASTATIN CALCIUM 80 MG PO TABS
80.0000 mg | ORAL_TABLET | Freq: Every day | ORAL | Status: DC
  Administered 2020-04-06 – 2020-04-08 (×3): 80 mg via ORAL
  Filled 2020-04-06 (×3): qty 1

## 2020-04-06 NOTE — ED Provider Notes (Signed)
[facility] EMERGENCY DEPARTMENT Provider Note   CSN: [id number] Arrival date & time: 04/05/20  0930   History Chief Complaint  Patient presents with  . Stroke Symptoms   [name]. [place] is a 49 year old female with past medical history significant for uncontrolled hypertension and tobacco use who presents for evaluation of weakness. She states that approximately one week ago, she went to sleep in her normal state of health but woke up with isolated weakness and numbness throughout her left arm and left leg. She states that she initially did not seek medical attention for this concern, however the weakness and numbness has been persistent. She has not taken any medications in an attempt to relieve her symptoms. She states the weakness and numbness has caused her to experience multiple falls without trauma to head. She denies involvement of the right-side of her body. She denies past history or family history of similar episodes. Otherwise, her only other complaint currently is some mild dizziness and hunger. She denies fevers, chills, chest pain, palpitations, shortness of breath, nausea, vomiting, and diarrhea.      Past Medical History:  Diagnosis Date  . Hypertension    OB History   No obstetric history on file.    Family History: Mother - cerebral palsy Aunt - cerebral palsy Father, deceased - heart attack No family history of stroke.  Social History: Tobacco use - Currently smokes approximately one pack per day Alcohol use - Currently drinks approximately six pack of beer per day Denies cocaine, marijuana, or other recreational drug use.  Home Medications Prior to Admission medications   Medication Sig Start Date End Date Taking? Authorizing Provider  hydrochlorothiazide (HYDRODIURIL) 25 MG tablet Take 1 tablet (25 mg total) by mouth daily. Patient not taking: Reported on 04/05/2020 02/29/16   [name], PA-C   HYDROcodone-acetaminophen (NORCO/VICODIN) 5-325 MG tablet Take 2 tablets by mouth every 4 (four) hours as needed. Patient not taking: Reported on 04/05/2020 07/31/16   [name], PA-C  methocarbamol (ROBAXIN) 500 MG tablet Take 1 tablet (500 mg total) by mouth 2 (two) times daily. Patient not taking: Reported on 04/05/2020 07/31/16   [name], PA-C   Allergies    Nsaids  Review of Systems   Review of Systems  Constitutional: Negative for chills and fever.  HENT: Negative for ear pain and sore throat.   Eyes: Negative for pain and visual disturbance.  Respiratory: Negative for cough and shortness of breath.   Cardiovascular: Negative for chest pain and palpitations.  Gastrointestinal: Negative for abdominal pain and vomiting.  Genitourinary: Negative for dysuria and hematuria.  Musculoskeletal: Negative for arthralgias and back pain.  Skin: Negative for color change and rash.  Neurological: Positive for dizziness, weakness and numbness. Negative for seizures, syncope and speech difficulty.  All other systems reviewed and are negative.  Physical Exam Updated Vital Signs BP (!) 175/109   Pulse 89   Temp 97.7 F (36.5 C) (Oral)   Resp (!) 25   Ht _0  (1.778 m)   SpO2 99%   BMI 19.66 kg/m   Physical Exam Vitals and nursing note reviewed.  Constitutional:      General: She is not in acute distress.    Appearance: She is well-developed.  HENT:     Head: Normocephalic and atraumatic.  Eyes:     Conjunctiva/sclera: Conjunctivae normal.  Cardiovascular:     Rate and Rhythm: Normal rate and regular rhythm.     Heart  sounds: No murmur heard.   Pulmonary:     Effort: Pulmonary effort is normal. No respiratory distress.     Breath sounds: Normal breath sounds.  Abdominal:     Palpations: Abdomen is soft.     Tenderness: There is no abdominal tenderness.  Musculoskeletal:     Cervical back: Neck supple.  Skin:    General: Skin is warm and dry.  Neurological:      Mental Status: She is alert and oriented to person, place, and time.     Cranial Nerves: No cranial nerve deficit.     Sensory: Sensory deficit present.     Motor: Weakness present.     Comments: No cranial nerve deficits, 4/5 strength of left upper and left lower extremity, 5/5 strength of right upper and right lower extremity. Diminished sensation of left upper and left lower extremity, sensation intact of right hemibody.    ED Results / Procedures / Treatments   Labs (all labs ordered are listed, but only abnormal results are displayed) Labs Reviewed  CBC - Abnormal; Notable for the following components:      Result Value   RBC 5.80 (*)    Hemoglobin 15.2 (*)    HCT 48.7 (*)    All other components within normal limits  COMPREHENSIVE METABOLIC PANEL - Abnormal; Notable for the following components:   CO2 20 (*)    Glucose, Bld 108 (*)    All other components within normal limits  I-STAT CHEM 8, ED - Abnormal; Notable for the following components:   Glucose, Bld 107 (*)    Calcium, Ion 1.06 (*)    Hemoglobin 17.3 (*)    HCT 51.0 (*)    All other components within normal limits  SARS CORONAVIRUS 2 BY RT PCR (HOSPITAL ORDER, [facility])  DIFFERENTIAL  LIPID PANEL  TSH  HEMOGLOBIN A1C  RPR  SEDIMENTATION RATE  HIV ANTIBODY (ROUTINE TESTING W REFLEX)  VITAMIN B1  URINALYSIS, ROUTINE W REFLEX MICROSCOPIC  RAPID URINE DRUG SCREEN, HOSP PERFORMED  JAK2 GENOTYPR  ERYTHROPOIETIN  I-STAT BETA HCG BLOOD, ED ([facility], [facility], [facility] ONLY)   EKG EKG Interpretation  Date/Time:  Tuesday April 05 2020 09:41:45 EDT Ventricular Rate:  89 PR Interval:  138 QRS Duration: 76 QT Interval:  396 QTC Calculation: 481 R Axis:   -83 Text Interpretation: Normal sinus rhythm with sinus arrhythmia Right atrial enlargement Left axis deviation Cannot rule out Anterior infarct , age undetermined Abnormal ECG When compared with ECG of 07/31/2016, No significant change was found  Confirmed by Delora Fuel ([id number]) on 04/05/2020 11:12:40 PM  Radiology CT HEAD WO CONTRAST  Result Date: 04/05/2020 CLINICAL DATA:  Left-sided weakness for 1 week EXAM: CT HEAD WITHOUT CONTRAST TECHNIQUE: Contiguous axial images were obtained from the base of the skull through the vertex without intravenous contrast. COMPARISON:  None. FINDINGS: Brain: Scattered areas of decreased attenuation are noted involving the right thalamus, right basal ganglia and right deep white matter in the parietal lobe consistent with acute to subacute ischemia. No significant mass effect is noted. No focal mass lesion is seen. No acute hemorrhage is noted. Vascular: No hyperdense vessel or unexpected calcification. Skull: Normal. Negative for fracture or focal lesion. Sinuses/Orbits: No acute finding. Other: None. IMPRESSION: Scattered areas of decreased attenuation on the right as described consistent with likely subacute ischemia given the clinical history. Electronically Signed   By: Inez Catalina M.D.   On: 04/05/2020 10:45   Procedures Procedures (  including critical care time)  Medications Ordered in ED Medications  sodium chloride flush (NS) 0.9 % injection 3 mL (has no administration in time range)  LORazepam (ATIVAN) injection 0-4 mg (0 mg Intravenous Not Given 04/06/20 4431)    Or  LORazepam (ATIVAN) tablet 0-4 mg ( Oral See Alternative 04/06/20 0922)  LORazepam (ATIVAN) injection 0-4 mg (has no administration in time range)    Or  LORazepam (ATIVAN) tablet 0-4 mg (has no administration in time range)  thiamine tablet 100 mg (100 mg Oral Given 04/06/20 1015)    Or  thiamine (B-1) injection 100 mg ( Intravenous See Alternative 04/06/20 1015)  LORazepam (ATIVAN) injection 2 mg (has no administration in time range)   ED Course  I have reviewed the triage vital signs and the nursing notes.  Pertinent labs & imaging results that were available during my care of the patient were reviewed by me and considered in  my medical decision making (see chart for details).    MDM Rules/Calculators/A&P                         [name]. [place] is a 49 year old female with past medical history significant for uncontrolled hypertension and tobacco use who presented for evaluation of weakness. On arrival to ED, she was noted to be hypertensive (177/106) but otherwise hemodynamically stable. Physical exam revealed left-sided upper and lower extremity weakness and diminished sensation. CBC and CMP were unremarkable. CT Head wo revealed scattered areas of decreased attenuation on right (involving the right thalamus, right basal ganglia, and right deep white matter in the parietal lobe) consistent with subacute ischemia. Her constellation of symptoms, physical examination and radiologic findings are consistent with a subacute ischemic stroke of unknown origin. Alternative etiologies for her symptoms were considered including complex migraine, however she denies ongoing headache throughout this time, and multiple TIAs, however she denies waxing and waning course of her symptoms. Neurology was consulted who ordered lipid panel, HbA1c, TSH, RPR, ESR, HIV, Vitamin B1, UA, Urine drug screen, CTA Head, and CTA Neck; currently awaiting further recommendations. CIWA protocol with ativan was initiated given her alcohol consumption history. She will need to be admitted for further evaluation and management of her subacute stroke. She understands and is agreeable with the plan. She has no further questions or concerns.   Final Clinical Impression(s) / ED Diagnoses Final diagnoses:  Ischemic stroke [facility])   Rx / DC Orders ED Discharge Orders    None       Paulla Dolly, MD 04/06/20 Patterson, Wenda Overland, MD 04/11/20 [phone]

## 2020-04-06 NOTE — Progress Notes (Signed)
Brought PT to MRI. PT looked at scanner. Asked if she wanted to attempt scan, she stated that she would try it. Got PT on table, told her we would need to remove hairpiece and put in ear protection. Patient started refusing once we attempted to give her ear plugs. She said she never agreed to come over here even with medicine. She also stated she just wanted to be left alone and for someone to get her some grits. Took patient off table, sent back to ED. RN notified.

## 2020-04-06 NOTE — ED Notes (Addendum)
This RN in to speak with pt and she states she is leaving.  Encouraged her to stay and she states she leaving because she called out 4 times to use the bathroom and no one came.  So she urinated in the floor.  Reminded pt that I had been in room just prior to this and she was upset cursing and didn't want anyone in the room.  Pt wanted a paper to write a note and states her ride is coming.  Pt has already gotten dressed.  Pt refuses to sign AMA and states, "I'm not signing sh-t."  Per NS and other nursing staff at desk outside of pt's room, call bell did not go off and they did not hear pt calling for help.  Pt continues to curse at staff and be uncooperative.

## 2020-04-06 NOTE — ED Notes (Addendum)
Pt given coke and graham crackers.  She requested grits but breakfast no longer being served.  Lunch tray was ordered.  Pt aware of need for urine sample.

## 2020-04-06 NOTE — ED Notes (Signed)
Pt refused vitals 

## 2020-04-06 NOTE — ED Notes (Addendum)
Pt initially went to MRI and returned for Ativan.  MRI notified that pt has now been medicated.  Pt aware that we still need urine sample.

## 2020-04-06 NOTE — ED Notes (Addendum)
Attempted report to floor.  Report given to Florentina Jenny, ED RN.

## 2020-04-06 NOTE — Progress Notes (Signed)
  Echocardiogram 2D Echocardiogram has been performed.  Roberta Calderon 04/06/2020, 3:20 PM

## 2020-04-06 NOTE — ED Notes (Signed)
Echo being completed at this time.

## 2020-04-06 NOTE — Hospital Course (Signed)
Neurology Consultation Reason for Consult: *** Referring Physician: ***  CC: ***  History is obtained from:***  HPI: Roberta Calderon is a 49 y.o. female ***   LKW: *** tPA given?: No, or if yes, time given *** IA performed?: No, or if yes, groin puncture time: *** Premorbid modified rankin scale: ***     0 - No symptoms.     1 - No significant disability. Able to carry out all usual activities, despite some symptoms.     2 - Slight disability. Able to look after own affairs without assistance, but unable to carry out all previous activities.     3 - Moderate disability. Requires some help, but able to walk unassisted.     4 - Moderately severe disability. Unable to attend to own bodily needs without assistance, and unable to walk unassisted.     5 - Severe disability. Requires constant nursing care and attention, bedridden, incontinent.     6 - Dead. ICH Score: ***  Time performed: *** GCS: {Blank single:19197::"3-4 is 2 points","5-12 is 1 point","13-15 is 0 points"} Infratentorial: {yes no:314532}. If yes, 1 point Volume: {Blank single:19197::">30cc is 1 point","<30cc is 0 points"}  Age: 49 y.o.. >80 is 1 point Intraventricular extension is 1 point  Score:***  A Score of {Blank single:19197::"0 points has a 30 day mortality of 0%","1 points has a 30 day mortality of 13%","2 points has a 30 day mortality of 26%","3 points has a 30 day mortality of 72%","4 points has a 30 day mortality of 97%","5-6 points has a 30 day mortality of 100%"}. Stroke. 2001 Apr;32(4):891-7.    ROS: A 14 point ROS was performed and is negative except as noted in the HPI. *** Unable to obtain due to altered mental status.   Past Medical History:  Diagnosis Date   Hypertension    ***  No family history on file. ***  Social History:  reports that she has been smoking cigarettes. She has been smoking about 0.25 packs per day. She has never used smokeless tobacco. She reports current alcohol use.  She reports that she does not use drugs. ***  Exam: Current vital signs: BP (!) 163/99 (BP Location: Right Arm)   Pulse 61   Temp 97.7 F (36.5 C) (Oral)   Resp 18   Ht 5\' 10"  (1.778 m)   SpO2 100%   BMI 19.66 kg/m  Vital signs in last 24 hours: Temp:  [97.7 F (36.5 C)-98.2 F (36.8 C)] 97.7 F (36.5 C) (08/11 0512) Pulse Rate:  [61-95] 61 (08/11 0709) Resp:  [16-20] 18 (08/11 0709) BP: (150-178)/(89-122) 163/99 (08/11 0709) SpO2:  [99 %-100 %] 100 % (08/11 0709)   Physical Exam  Constitutional: Appears well-developed and well-nourished.  Psych: Affect appropriate to situation Eyes: No scleral injection HENT: No OP obstrucion MSK: no joint deformities.  Cardiovascular: Normal rate and regular rhythm.  Respiratory: Effort normal, non-labored breathing GI: Soft.  No distension. There is no tenderness.  Skin: WDI  Neuro: Mental Status: Patient is awake, alert, oriented to person, place, month, year, and situation.*** Patient is able to give a clear and coherent history.*** No signs of aphasia or neglect*** Cranial Nerves: II: Visual Fields are full. Pupils are equal, round, and reactive to light.  *** III,IV, VI: EOMI without ptosis or diploplia.  V: Facial sensation is symmetric to temperature VII: Facial movement is symmetric.  VIII: hearing is intact to voice X: Uvula elevates symmetrically XI: Shoulder shrug is symmetric. XII: tongue  is midline without atrophy or fasciculations.  Motor: Tone is normal. Bulk is normal. 5/5 strength was present in all four extremities. *** Sensory: Sensation is symmetric to light touch and temperature in the arms and legs.*** Deep Tendon Reflexes: 2+ and symmetric in the biceps and patellae. *** Plantars: Toes are downgoing bilaterally. *** Cerebellar: FNF and HKS are intact bilaterally***      I have reviewed labs in epic and the results pertinent to this consultation are: -Hemoglobin baseline 13.7 in 2017, 17.3 on  admission  I have reviewed the images obtained:***   Impression: ***  Recommendations: 1) ***   # polycythemia  > Possibly secondary to smoking, but notably hemoglobin has increased from baseline of 13.7 in 2017 -Trend CBC -Appreciate primary team work-up and management  #Daily alcohol use -Appreciate CIWA -Thiamine supplementation  Lesleigh Noe MD-PhD [facility] [phone]

## 2020-04-06 NOTE — ED Notes (Addendum)
Per [place], Agricultural consultant pt is staying and going upstairs.  [place] states pt to go without IV because she was refusing further treatment in ED.  Attempted to call report.  Pt out to desk and wanted her meal tray.  Pt refused to change back into gown.  Pt back in room and is eating.

## 2020-04-06 NOTE — Progress Notes (Signed)
MD called MRI to inform that patient was very claustrophobic and that the patient had agreed to come down to look at the scanner and see if they would be able to tolerate exam.   MD stated that they would order 2 of Ativan for the patient to be given before the exam.  When transport was sent for patient, patient refused to leave room in the ED without first receiving medication for claustrophobia. MRI contacted RN to ask that medication be given to patient so that scan could be attempted RN stated that there was no medication ordered and they would need to call MD first.

## 2020-04-06 NOTE — ED Notes (Signed)
This RN in another pt's room.  Another RN went in to check on pt when she was pulling all her wires off.  Pt states she is leaving.  States she called out with call bell to use bathroom and no one came so she urinated in floor. Per NS, call bell did not go off.  RN removed IV after encouraging pt to stay and she refused.

## 2020-04-06 NOTE — CV Procedure (Signed)
2D echo attempted, but patient going to  MRI. Will try later

## 2020-04-06 NOTE — ED Notes (Signed)
Pt in MRI.

## 2020-04-06 NOTE — ED Notes (Signed)
Pt returning from MRI.  MRI called and states pt refused.

## 2020-04-06 NOTE — Consult Note (Addendum)
Neurology Consultation Reason for Consult: Left-sided weakness Referring Physician: Cato Mulligan  CC: Left-sided weakness  History is obtained from: Patient and chart review  HPI: Roberta Calderon is a 49 y.o. female with a past medical history significant for hypertension, tobacco abuse (1 pack/day) alcohol use disorder (reportedly 6 beers per day), homelessness, presenting with a 1 week history of left-sided weakness.  She reports that she woke up about a week ago and had a left-sided headache that was intermittent, improving on Sunday.  She additionally had weakness in the left arm and leg as well as some loss of sensation. She additionally had some blurry vision.  She denies any recent drug use, but when asked produce a sample of urine when I assisted her to the bathroom to urinate, she stated she could only capture 1 drop.  LKW: Over 1 week ago  ROS: Attempted to perform a full review of systems but patient became irritable and refused to answer further questions stating "I wish you all came here in the same time and tired of everybody asking the same questions over and over again"  Past Medical History:  Diagnosis Date   Hypertension   -Poor access to medical care  No family history on file. Patient denies any other family members with stroke and reports she is unaware of any medical problems and family  Social History:  reports that she has been smoking cigarettes. She has been smoking about 0.25 packs per day. She has never used smokeless tobacco. She reports current alcohol use. She reports that she does not use drugs.  Exam: Current vital signs: BP (!) 175/109    Pulse 89    Temp 97.7 F (36.5 C) (Oral)    Resp (!) 25    Ht _0  (1.778 m)    SpO2 99%    BMI 19.66 kg/m  Vital signs in last 24 hours: Temp:  [97.7 F (36.5 C)-97.9 F (36.6 C)] 97.7 F (36.5 C) (08/11 0512) Pulse Rate:  [61-90] 89 (08/11 1027) Resp:  [16-25] 25 (08/11 1027) BP: (150-178)/(89-122)  175/109 (08/11 1027) SpO2:  [99 %-100 %] 99 % (08/11 1027)   Physical Exam  Constitutional: Appears well-developed and well-nourished.  Psych: Affect appropriate to situation Eyes: No scleral injection HENT: No OP obstrucion MSK: no joint deformities.  Cardiovascular: Normal rate and regular rhythm.  Respiratory: Effort normal, non-labored breathing GI: Soft.  No distension. There is no tenderness.  Skin: WDI  Neuro: Mental Status: Patient is awake, alert, oriented to person, place, month, year, and situation. Patient gives a limited history No signs of aphasia or neglect Cranial Nerves: II: Visual Fields are full. Pupils are equal, round, and reactive to light.  III,IV, VI: EOMI without ptosis or diploplia, though she does report some worsening blurry vision to left gaze V: Facial sensation is symmetric to temperature VII: Facial movement is symmetric.  VIII: hearing is intact to voice X: Uvula elevates symmetrically XI: Shoulder shrug is symmetric. XII: tongue is midline without atrophy or fasciculations.  Motor: Tone is normal.  Her exam is somewhat effort dependent and pain limited by left hip pain. Sensory: She reports his sensation is about 80% reduced in her left arm and left leg to both light touch and temperature Deep Tendon Reflexes: 3+ biceps and brachioradialis bilaterally in the upper extremities, but brisker on the left with a more prominent Hoffmann's on the left, positive crossed abductors, 2+ patellar's bilaterally Plantars: Toes are downgoing bilaterally.  Cerebellar: Finger-nose testing  is mildly dysmetric bilaterally  I have reviewed labs in epic and the results pertinent to this consultation are:  No results found for: CHOL, HDL, LDLCALC, LDLDIRECT, TRIG, CHOLHDL Lab Results  Component Value Date   HGBA1C 6.3 (H) 04/05/2020    Initial hemoglobin of 15.7 increased to 17, but then normalized on repeat check  I have reviewed the images  obtained:  Head CT shows chronic microvascular changes with superimposed scattered hypodensities of unclear chronicity.  Certainly there are some lesions in the left hemisphere that could explain her symptoms of left-sided numbness  Impression: Roberta Calderon is a 49 year old woman with a past medical history significant for hypertension and poor access to medical care likely with multiple stroke risk factors to be discovered on this admission.  The time course of her symptoms is concerning for stroke.  Given that she is refusing MRI due to claustrophobia, I suggested Ativan in hopes of getting at least DWI images to clarify whether she has bihemispheric strokes or single territory.  Certainly the pattern of her strokes could be central embolic.  Given her age and possibility of substance use, RCVS triggered by substance use is another possibility.  Given that she is also reporting some left-sided neck pain, I considered the possibility of a dissection as well.  An investigation of infectious causes (HIV, syphilis) is also warranted and will assess for inflammatory state using ESR.  Recommendations:  # Multifocal infarcts - HgbA1c, fasting lipid panel, TSH, RPR, HIV, ESR  - Will consider further hypercoagulability workup pending initial labs and workup - MRI of the brain without contrast w/ 2 mg Ativan PRN scan due to patient anxiety - CTA head and neck  - Frequent neuro checks - Echocardiogram - Carotid dopplers - Prophylactic therapy-Antiplatelet med: Aspirin - dose 347m PO or 3073mPR, hold pending imaging results  - Risk factor modification, including smoking cessation counseling - Telemetry monitoring - PT consult, OT consult, Speech consult - Stroke team to follow  # Alcohol use disorder -CIWAs -Appreciate management by primary team  SrLesleigh NoeD-PhD [facility] [phone] for charge capture

## 2020-04-06 NOTE — H&P (Signed)
Date: 04/06/2020               Patient Name:  Roberta Calderon MRN: [id number]  DOB: August 14, 1971 Age / Sex: 49 y.o., female   PCP: Patient, No Pcp Per         Medical Service: Internal Medicine Teaching Service         Attending Physician: Dr. Jimmye Calderon    First Contact: Dr. Coy Calderon Pager: [phone]  Second Contact: Roberta Distance, DO, [facility] Pager: Roberta Calderon [phone])       After Hours (After 5p/  First Contact Pager: [phone]  weekends / holidays): Second Contact Pager: [phone]   Chief Complaint: Left sided weakness  History of Present Illness: Roberta Calderon is a 49 year old woman with medical history significant for hypertension-uncontrolled, tobacco use disorder who presented with left-sided weakness.  She states that she was in her usual state of health until about a week ago when she began experiencing paresthesia and weakness of the left upper and lower extremities.  She denies any precipitating factor and does not report of any alleviating symptoms.  She does report of intermittent dizziness, weak grip of the left upper extremity but denies fevers, chills, chest pain, shortness of breath, abdominal pain, nausea, vomiting or palpitations.  She states that since the onset of her symptoms, she has had an unsteady gait and shuffles her left foot.  Due to the unsteadiness of her feet, she has had an episode of ground-level fall but denies any head trauma.    Lab Orders     SARS Coronavirus 2 by RT PCR (hospital order, performed in [facility] hospital lab) Nasopharyngeal Nasopharyngeal Swab     CBC     Differential     Comprehensive metabolic panel     Lipid panel     Hemoglobin A1c     Vitamin B1     Urinalysis, Routine w reflex microscopic     Urine rapid drug screen (hosp performed)     JAK2 genotypr     Erythropoietin     TSH     Sedimentation rate     RPR     HIV Antibody (routine testing w rflx)     CBC with Differential/Platelet     I-stat chem 8, ED     I-Stat beta  hCG blood, ED   Meds:  No current facility-administered medications on file prior to encounter.   Current Outpatient Medications on File Prior to Encounter  Medication Sig  . hydrochlorothiazide (HYDRODIURIL) 25 MG tablet Take 1 tablet (25 mg total) by mouth daily. (Patient not taking: Reported on 04/05/2020)  . HYDROcodone-acetaminophen (NORCO/VICODIN) 5-325 MG tablet Take 2 tablets by mouth every 4 (four) hours as needed. (Patient not taking: Reported on 04/05/2020)  . methocarbamol (ROBAXIN) 500 MG tablet Take 1 tablet (500 mg total) by mouth 2 (two) times daily. (Patient not taking: Reported on 04/05/2020)     Allergies: Allergies as of 04/05/2020 - Review Complete 04/05/2020  Allergen Reaction Noted  . Nsaids Nausea And Vomiting and Rash 07/31/2016   Past Medical History:  Diagnosis Date  . Hypertension     Family History: Mother with hypertension  Social History: Lives in [place], works at [employer].  Endorses tobacco use and alcohol use though unable to give me a specific quantity.  Sexually active with men and denies any high risk sexual behavior.  Review of Systems: A complete ROS was negative except as per HPI.   Physical Exam: Blood pressure Marland Kitchen)  175/109, pulse 89, temperature 97.7 F (36.5 C), temperature source Oral, resp. rate (!) 25, height 5\' 10"  (1.778 m), SpO2 99 %. Physical Exam Constitutional:      General: She is not in acute distress.    Appearance: Normal appearance. She is normal weight. She is not ill-appearing, toxic-appearing or diaphoretic.  HENT:     Head: Normocephalic and atraumatic.     Mouth/Throat:     Mouth: Mucous membranes are dry.  Eyes:     General: No scleral icterus.       Right eye: No discharge.        Left eye: No discharge.  Cardiovascular:     Rate and Rhythm: Normal rate.     Heart sounds: Murmur: Questionable soft systolic murmur.   Pulmonary:     Effort: No respiratory distress.     Breath sounds: Normal breath  sounds. No wheezing, rhonchi or rales.  Abdominal:     General: Bowel sounds are normal.     Palpations: Abdomen is soft.     Tenderness: There is no abdominal tenderness. There is no guarding.  Musculoskeletal:        General: No deformity or signs of injury.     Right lower leg: No edema.     Left lower leg: No edema.  Skin:    General: Skin is warm.  Neurological:     Mental Status: She is alert.  Psychiatric:     Comments: Frustrated as she had a wait for over 20 hours to see a provider   Neurologic exam: Mental status: A&Ox3 Cranial Nerves: II: PERRL III, IV, VI: Extra-occular motions intact bilaterally V, VII: Face symmetric, sensation intact in all 3 divisions  IX, X: palate rises symmetrically XI: Head turn and shoulder shrug normal bilaterally  XII: tongue midline  Motor:  -Left upper extremity: 3/5 -Left lower extremity: 3/5 -Right upper extremity: 5/5 -Right lower extremity: 5/5 Gait:Unable to assess Sensory: Light touch intact and symmetric bilaterally  Coordination: There is no dysmetria on finger-to-nose.  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  However her CBC showed elevated hemoglobin of 15.2 with repeat of 17.3, hematocrit of 48.7 with repeat of 51 concern for possible polycythemia vera as listed in the WHO major criteria.  Given this, will go ahead and order JAK2 mutation and erythropoietin level -- Neurology consult -- Allow for permissive HTN in the setting of  (systolic < 122 and diastolic < 482) if no tPA. If given tPA (systolic <500 and diastolic <370 prior to treatment, and <180/105 in the first 24 hrs following thrombolytic therapy  -- Prophylactic antiplatelet with aspirin or Plavix pending MRI brain -- Lipitor 80 mg daily -- Echocardiogram  -- Carotid doppler or CTA head & neck  -- MRI brain without contrast -- A1C  -- Lipid panel  --  Tele monitoring  -- SLP eval -- PT/OT   #? Polycythemia vera Presents with thrombotic events and found to have elevated hemoglobin of 15.2 with repeat of 17.3, hematocrit of 48.7 with repeat of 51 concern for possible polycythemia vera as listed in the WHO major criteria.  Given this, will go ahead and order JAK2 mutation and erythropoietin level -Follow-up JAK2 mutation level and erythropoietin level   #Hypertension-uncontrolled Takes hydrochlorothiazide 25 mg daily though unsure about compliance -Hold medicine and allow for permissive hypertension   #Tobacco use disorder -NicoDerm   #Alcohol use disorder -On CIWA with Ativan   FEN: N.p.o. pending speech evaluation VTE ppx: Subcutaneous Lovenox CODE STATUS: Full code  Prior to Admission Living Arrangement: Home Anticipated Discharge Location: Pending Barriers to Discharge: Management of stroke  Dispo: Admit patient to Inpatient with expected length of stay greater than 2 midnights.  Signed: Jean Rosenthal, MD 04/06/2020, 11:26 AM  Pager: [phone] Internal Medicine Teaching Service After 5pm on weekdays and 1pm on weekends: On Call pager: [phone]

## 2020-04-06 NOTE — ED Notes (Signed)
Pt refusing to participate in modified NIHSS.  Pt cursing and upset because when tech was in room around 4pm pt's spaghetti from lunch tray was accidentally knocked in floor.  Tech asked NS to order pt a new tray.  Lunch was no longer being served and dinner tray was ordered.  Pt had a visitor bring in [place] around lunch time that she ate prior to the spaghetti coming and the lunch tray had been sitting out for several hours at this time.  This RN gave pt graham crackers and coke this morning to hold her over until lunch tray arrived.  Pt continues to curse and states she doesn't want to be here.  Apologized again for the tech knocking the spaghetti in the floor.  Informed pt that a dinner tray should be here soon and she states "I probably don't want that sh-t!"  Pt not wanting RN to obtain vital signs or do neuro assessment.

## 2020-04-07 DIAGNOSIS — I639 Cerebral infarction, unspecified: Secondary | ICD-10-CM

## 2020-04-07 LAB — URINALYSIS, ROUTINE W REFLEX MICROSCOPIC
Bilirubin Urine: NEGATIVE
Glucose, UA: NEGATIVE mg/dL
Hgb urine dipstick: NEGATIVE
Ketones, ur: NEGATIVE mg/dL
Leukocytes,Ua: NEGATIVE
Nitrite: NEGATIVE
Protein, ur: NEGATIVE mg/dL
Specific Gravity, Urine: 1.014 (ref 1.005–1.030)
pH: 6 (ref 5.0–8.0)

## 2020-04-07 LAB — ERYTHROPOIETIN: Erythropoietin: 5.5 m[IU]/mL (ref 2.6–18.5)

## 2020-04-07 LAB — RAPID URINE DRUG SCREEN, HOSP PERFORMED
Amphetamines: NOT DETECTED
Barbiturates: NOT DETECTED
Benzodiazepines: POSITIVE — AB
Cocaine: POSITIVE — AB
Opiates: NOT DETECTED
Tetrahydrocannabinol: NOT DETECTED

## 2020-04-07 LAB — RPR: RPR Ser Ql: NONREACTIVE

## 2020-04-07 MED ORDER — LORAZEPAM 2 MG/ML IJ SOLN
2.0000 mg | Freq: Once | INTRAMUSCULAR | Status: AC | PRN
  Administered 2020-04-08: 2 mg via INTRAVENOUS
  Filled 2020-04-07: qty 1

## 2020-04-07 MED ORDER — LABETALOL HCL 5 MG/ML IV SOLN: 5.0000 mg | INTRAVENOUS | Status: DC | PRN

## 2020-04-07 MED ORDER — ACETAMINOPHEN 325 MG PO TABS
650.0000 mg | ORAL_TABLET | Freq: Four times a day (QID) | ORAL | Status: AC
  Administered 2020-04-07 – 2020-04-08 (×4): 650 mg via ORAL
  Filled 2020-04-07 (×3): qty 2

## 2020-04-07 MED ORDER — ASPIRIN EC 81 MG PO TBEC
81.0000 mg | DELAYED_RELEASE_TABLET | Freq: Every day | ORAL | Status: DC
  Administered 2020-04-07: 81 mg via ORAL
  Filled 2020-04-07 (×2): qty 1

## 2020-04-07 MED ORDER — AMLODIPINE BESYLATE 10 MG PO TABS
10.0000 mg | ORAL_TABLET | Freq: Every day | ORAL | Status: DC
  Administered 2020-04-07 – 2020-04-08 (×2): 10 mg via ORAL
  Filled 2020-04-07 (×2): qty 1

## 2020-04-07 MED ORDER — CLOPIDOGREL BISULFATE 75 MG PO TABS
75.0000 mg | ORAL_TABLET | Freq: Every day | ORAL | Status: DC
  Administered 2020-04-07 – 2020-04-08 (×2): 75 mg via ORAL
  Filled 2020-04-07 (×2): qty 1

## 2020-04-07 NOTE — Consult Note (Addendum)
Physical Medicine and Rehabilitation Consult Reason for Consult: Left side weakness Referring Physician: [facility]   HPI: Roberta Calderon is a 49 y.o. right-handed female with history of hypertension and tobacco abuse presented 04/06/2020 with left-sided weakness.  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  She has a mother and daughter in the area.  Cranial CT scan showed scattered areas of decreased attenuation on the right consistent with subacute ischemia.  CTA of head and neck no intracranial large vessel occlusion or proximal high-grade stenosis.  Echocardiogram with ejection fraction of 62% grade 1 diastolic dysfunction.  Admission chemistries glucose 108, hemoglobin 15.2.  TSH 0.887, SARS coronavirus negative, urine drug screen positive cocaine and benzos..  Currently maintained on aspirin and Plavix for CVA prophylaxis.  Subcutaneous Lovenox for DVT prophylaxis.  Tolerating a regular diet.  Therapy evaluations completed with recommendations of physical medicine rehab consult.  Pt said had BM last night- urinating OK per pt.    Review of Systems  Constitutional: Negative for chills and fever.  HENT: Negative for hearing loss.   Eyes: Negative for blurred vision and double vision.  Respiratory: Negative for cough and shortness of breath.   Cardiovascular: Negative for chest pain, palpitations and leg swelling.  Gastrointestinal: Positive for constipation. Negative for heartburn, nausea and vomiting.  Genitourinary: Negative for flank pain and hematuria.  Musculoskeletal: Positive for myalgias.  Skin: Negative for rash.  Neurological: Positive for dizziness and headaches.  All other systems reviewed and are negative.  Past Medical History:  Diagnosis Date  . Hypertension    History reviewed. No pertinent surgical history. No family history on file. Social History:  reports that she has been smoking cigarettes. She has  been smoking about 0.25 packs per day. She has never used smokeless tobacco. She reports current alcohol use. She reports that she does not use drugs. Allergies:  Allergies  Allergen Reactions  . Nsaids Nausea And Vomiting and Rash   Medications Prior to Admission  Medication Sig Dispense Refill  . hydrochlorothiazide (HYDRODIURIL) 25 MG tablet Take 1 tablet (25 mg total) by mouth daily. (Patient not taking: Reported on 04/05/2020) 30 tablet 0  . HYDROcodone-acetaminophen (NORCO/VICODIN) 5-325 MG tablet Take 2 tablets by mouth every 4 (four) hours as needed. (Patient not taking: Reported on 04/05/2020) 16 tablet 0  . methocarbamol (ROBAXIN) 500 MG tablet Take 1 tablet (500 mg total) by mouth 2 (two) times daily. (Patient not taking: Reported on 04/05/2020) 20 tablet 0    Home: Home Living Family/patient expects to be discharged to:: Shelter/Homeless  Functional History: Prior Function Level of Independence: Independent Functional Status:  Mobility: Bed Mobility Overal bed mobility: Needs Assistance Bed Mobility: Supine to Sit, Sit to Supine Supine to sit: HOB elevated, Min guard Sit to supine: Min guard General bed mobility comments: increased time, min guard for safety Transfers Overall transfer level: Needs assistance Equipment used: 2 person hand held assist Transfers: Sit to/from Stand Sit to Stand: +2 safety/equipment, Min assist General transfer comment: assist to power up and stabilize balance Ambulation/Gait Ambulation/Gait assistance: Mod assist, +2 safety/equipment Gait Distance (Feet): 130 Feet Assistive device: 2 person hand held assist Gait Pattern/deviations: Step-through pattern, Drifts right/left General Gait Details: multiple episodes of LOB requiring mod assist to maintain balance, increasing in frequency with fatigue Gait velocity: decreased Gait velocity interpretation: 1.31 - 2.62 ft/sec, indicative of limited community ambulator    ADL: ADL Overall  ADL's : Needs  assistance/impaired Grooming: Min guard, Standing Grooming Details (indicate cue type and reason): increased time to manage items with L hand, min guard standing  Upper Body Bathing: Set up, Sitting Lower Body Bathing: Sit to/from stand, Minimal assistance Upper Body Dressing : Minimal assistance, Sitting Lower Body Dressing: Minimal assistance, Sit to/from stand Toilet Transfer: Minimal assistance, Ambulation Toilet Transfer Details (indicate cue type and reason): simulated in room Functional mobility during ADLs: Minimal assistance, Moderate assistance, Cueing for safety General ADL Comments: pt limited by L sided weakness, impaired balance and decreased safety awareness   Cognition: Cognition Overall Cognitive Status: No family/caregiver present to determine baseline cognitive functioning Orientation Level: Oriented X4 Cognition Arousal/Alertness: Awake/alert Behavior During Therapy: Flat affect Overall Cognitive Status: No family/caregiver present to determine baseline cognitive functioning General Comments: Subdued, most likely due to receiving Ativan overnight. Following simple commands consistently. Decreased safety awareness. Further cognitive assessment required, pt with minimal verbalizations and decreased engagement with therapist (attempted cog screen, but pt declined to answer therapist)  Blood pressure (!) 162/99, pulse 65, temperature 97.7 F (36.5 C), temperature source Oral, resp. rate 18, height 5\' 10"  (1.778 m), SpO2 100 %. Physical Exam Vitals and nursing note reviewed.  Constitutional:      Comments: Pt sleepy- didn't want to wake up- appropriate, Oriented x3- per chart had said she initially was agitated/confused?;  Does c/o L leg aching/painful- not TTP  HENT:     Head: Normocephalic and atraumatic.     Comments: Mild L facial droop- tongue midline- not coated    Right Ear: External ear normal.     Left Ear: External ear normal.     Nose: Nose  normal. No congestion.     Mouth/Throat:     Mouth: Mucous membranes are dry.     Pharynx: Oropharynx is clear. No oropharyngeal exudate.  Eyes:     Comments: 90% of time eyes closed- kept trying to sleep However EOMI B/L when tested- no nystagmus seen  Cardiovascular:     Comments: RRR- no M/R/G Pulmonary:     Comments: CTA B/L- no W/R/R- good air movement  Abdominal:     Comments: Soft, NT, ND, (+)BS   Musculoskeletal:     Cervical back: Normal range of motion and neck supple. No rigidity.     Comments: RUE- 5-/5- poor effort/poor compliance RLE 5-/5 as well LUE- 3/5 in biceps, triceps, WE, grip and finger abd- not sure if effort played a role since kept falling asleep LLE- 2/5 in HF, KE, KF, DF and PF  Skin:    Comments: IV in R forearm- looks good-   Neurological:     Mental Status: She is alert.     Comments: Patient is alert in no acute distress.  Oriented x3.  Follows commands.  Fair awareness of deficits.  Sleepy- didn't wake up well-  Poor compliance with exam Eyes closed 90% of time Decreased sensation to light touch in LUE/LLE   Psychiatric:     Comments: Sleepy- vague     Results for orders placed or performed during the hospital encounter of 04/05/20 (from the past 24 hour(s))  TSH     Status: None   Collection Time: 04/06/20 11:05 AM  Result Value Ref Range   TSH 0.887 0.350 - 4.500 uIU/mL  Sedimentation rate     Status: None   Collection Time: 04/06/20 11:05 AM  Result Value Ref Range   Sed Rate 5 0 - 22 mm/hr  RPR     Status:  None   Collection Time: 04/06/20 11:05 AM  Result Value Ref Range   RPR Ser Ql NON REACTIVE NON REACTIVE  HIV Antibody (routine testing w rflx)     Status: None   Collection Time: 04/06/20 11:05 AM  Result Value Ref Range   HIV Screen 4th Generation wRfx Non Reactive Non Reactive  CBC with Differential/Platelet     Status: Abnormal   Collection Time: 04/06/20 11:05 AM  Result Value Ref Range   WBC 4.6 4.0 - 10.5 K/uL   RBC  5.35 (H) 3.87 - 5.11 MIL/uL   Hemoglobin 13.6 12.0 - 15.0 g/dL   HCT 44.8 36 - 46 %   MCV 83.7 80.0 - 100.0 fL   MCH 25.4 (L) 26.0 - 34.0 pg   MCHC 30.4 30.0 - 36.0 g/dL   RDW 14.3 11.5 - 15.5 %   Platelets 299 150 - 400 K/uL   nRBC 0.0 0.0 - 0.2 %   Neutrophils Relative % 45 %   Neutro Abs 2.1 1.7 - 7.7 K/uL   Lymphocytes Relative 43 %   Lymphs Abs 2.0 0.7 - 4.0 K/uL   Monocytes Relative 7 %   Monocytes Absolute 0.3 0 - 1 K/uL   Eosinophils Relative 4 %   Eosinophils Absolute 0.2 0 - 0 K/uL   Basophils Relative 1 %   Basophils Absolute 0.0 0 - 0 K/uL   Immature Granulocytes 0 %   Abs Immature Granulocytes 0.01 0.00 - 0.07 K/uL  Urinalysis, Routine w reflex microscopic     Status: Abnormal   Collection Time: 04/07/20  8:25 AM  Result Value Ref Range   Color, Urine YELLOW YELLOW   APPearance HAZY (A) CLEAR   Specific Gravity, Urine 1.014 1.005 - 1.030   pH 6.0 5.0 - 8.0   Glucose, UA NEGATIVE NEGATIVE mg/dL   Hgb urine dipstick NEGATIVE NEGATIVE   Bilirubin Urine NEGATIVE NEGATIVE   Ketones, ur NEGATIVE NEGATIVE mg/dL   Protein, ur NEGATIVE NEGATIVE mg/dL   Nitrite NEGATIVE NEGATIVE   Leukocytes,Ua NEGATIVE NEGATIVE  Urine rapid drug screen (hosp performed)     Status: Abnormal   Collection Time: 04/07/20  8:26 AM  Result Value Ref Range   Opiates NONE DETECTED NONE DETECTED   Cocaine POSITIVE (A) NONE DETECTED   Benzodiazepines POSITIVE (A) NONE DETECTED   Amphetamines NONE DETECTED NONE DETECTED   Tetrahydrocannabinol NONE DETECTED NONE DETECTED   Barbiturates NONE DETECTED NONE DETECTED   CT Code Stroke CTA Head W/WO contrast  Result Date: 04/06/2020 CLINICAL DATA:  Neuro deficit, acute, stroke suspected. Additional history provided: Patient presents with weakness to left side for 1 week, worsening, left arm with weak grip strength, drift to the left. EXAM: CT ANGIOGRAPHY HEAD AND NECK TECHNIQUE: Multidetector CT imaging of the head and neck was performed using the  standard protocol during bolus administration of intravenous contrast. Multiplanar CT image reconstructions and MIPs were obtained to evaluate the vascular anatomy. Carotid stenosis measurements (when applicable) are obtained utilizing NASCET criteria, using the distal internal carotid diameter as the denominator. CONTRAST:  53mL OMNIPAQUE IOHEXOL 350 MG/ML SOLN COMPARISON:  Head CT 04/05/2020 FINDINGS: CT HEAD FINDINGS Brain: Redemonstrated ischemic infarction changes within the right thalamus and thalamocapsular junction, possibly subacute. Redemonstrated age-indeterminate lacunar infarcts within the basal ganglia bilaterally. Unchanged chronic lacunar infarct within the left thalamus and paramedian posterior right pons. Mild background ill-defined hypoattenuation within cerebral white matter is advanced for age and nonspecific, but consistent with chronic small vessel ischemic disease.  There is no acute intracranial hemorrhage. No demarcated cortical infarct is identified. No extra-axial fluid collection. No evidence of intracranial mass. No midline shift. Vascular: Reported below. Skull: Normal. Negative for fracture or focal lesion. Sinuses: No significant paranasal sinus disease or mastoid effusion Orbits: No acute orbital abnormality identified. Review of the MIP images confirms the above findings CTA NECK FINDINGS Aortic arch: Standard aortic branching. Minimal calcified plaque within the visualized aortic arch and proximal major branch vessels of the neck. No hemodynamically significant innominate or proximal subclavian artery stenosis. Right carotid system: CCA and ICA patent within the neck without significant stenosis (50% or greater). Mild calcified plaque at the carotid bifurcation. Left carotid system: CCA and ICA patent within the neck without significant stenosis (50% or greater). Mild calcified plaque at the carotid bifurcation. Vertebral arteries: Codominant and patent within the neck without  significant stenosis. Skeleton: No acute bony abnormality or aggressive osseous lesion. Advanced for age cervical spondylosis with multilevel disc space narrowing, posterior disc osteophytes, uncovertebral and facet hypertrophy. Other neck: No neck mass or cervical lymphadenopathy Upper chest: No consolidation within the imaged lung apices. Review of the MIP images confirms the above findings CTA HEAD FINDINGS Anterior circulation: The intracranial internal carotid arteries are patent. Calcified plaque within both vessels with no more than mild stenosis. The M1 middle cerebral arteries are patent without significant stenosis. No M2 proximal branch occlusion or high-grade proximal stenosis is identified. The anterior cerebral arteries are patent. No intracranial aneurysm is identified. Posterior circulation: The intracranial vertebral arteries are patent. The basilar artery is patent. The posterior cerebral arteries are patent. Posterior communicating arteries are hypoplastic or absent bilaterally. Venous sinuses: Within limitations of contrast timing, no convincing thrombus. Anatomic variants: As described Review of the MIP images confirms the above findings IMPRESSION: CT head: 1. Redemonstrated ischemic infarction changes within the right thalamus and thalamocapsular junction, possibly subacute. Consider brain MRI for further evaluation. 2. Redemonstrated age-indeterminate lacunar infarcts within the basal ganglia bilaterally. 3. 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 4. Mild background cerebral white matter chronic small vessel ischemic disease, which is advanced for age. CTA neck: 1. The common carotid, internal carotid and vertebral arteries are patent within the neck without hemodynamically significant stenosis. Mild calcified plaque within the visualized aortic arch, proximal major branch vessels of the neck and carotid bifurcations. CTA head: 1. No intracranial large vessel occlusion  or proximal high-grade arterial stenosis. 2. Mild calcified plaque within both intracranial internal carotid arteries with no more than mild stenosis. Electronically Signed   By: Kellie Simmering DO   On: 04/06/2020 13:18   CT Code Stroke CTA Neck W/WO contrast  Result Date: 04/06/2020 CLINICAL DATA:  Neuro deficit, acute, stroke suspected. Additional history provided: Patient presents with weakness to left side for 1 week, worsening, left arm with weak grip strength, drift to the left. EXAM: CT ANGIOGRAPHY HEAD AND NECK TECHNIQUE: Multidetector CT imaging of the head and neck was performed using the standard protocol during bolus administration of intravenous contrast. Multiplanar CT image reconstructions and MIPs were obtained to evaluate the vascular anatomy. Carotid stenosis measurements (when applicable) are obtained utilizing NASCET criteria, using the distal internal carotid diameter as the denominator. CONTRAST:  79mL OMNIPAQUE IOHEXOL 350 MG/ML SOLN COMPARISON:  Head CT 04/05/2020 FINDINGS: CT HEAD FINDINGS Brain: Redemonstrated ischemic infarction changes within the right thalamus and thalamocapsular junction, possibly subacute. Redemonstrated age-indeterminate lacunar infarcts within the basal ganglia bilaterally. Unchanged chronic lacunar infarct within the left thalamus and  paramedian posterior right pons. Mild background ill-defined hypoattenuation within cerebral white matter is advanced for age and nonspecific, but consistent with chronic small vessel ischemic disease. There is no acute intracranial hemorrhage. No demarcated cortical infarct is identified. No extra-axial fluid collection. No evidence of intracranial mass. No midline shift. Vascular: Reported below. Skull: Normal. Negative for fracture or focal lesion. Sinuses: No significant paranasal sinus disease or mastoid effusion Orbits: No acute orbital abnormality identified. Review of the MIP images confirms the above findings CTA NECK  FINDINGS Aortic arch: Standard aortic branching. Minimal calcified plaque within the visualized aortic arch and proximal major branch vessels of the neck. No hemodynamically significant innominate or proximal subclavian artery stenosis. Right carotid system: CCA and ICA patent within the neck without significant stenosis (50% or greater). Mild calcified plaque at the carotid bifurcation. Left carotid system: CCA and ICA patent within the neck without significant stenosis (50% or greater). Mild calcified plaque at the carotid bifurcation. Vertebral arteries: Codominant and patent within the neck without significant stenosis. Skeleton: No acute bony abnormality or aggressive osseous lesion. Advanced for age cervical spondylosis with multilevel disc space narrowing, posterior disc osteophytes, uncovertebral and facet hypertrophy. Other neck: No neck mass or cervical lymphadenopathy Upper chest: No consolidation within the imaged lung apices. Review of the MIP images confirms the above findings CTA HEAD FINDINGS Anterior circulation: The intracranial internal carotid arteries are patent. Calcified plaque within both vessels with no more than mild stenosis. The M1 middle cerebral arteries are patent without significant stenosis. No M2 proximal branch occlusion or high-grade proximal stenosis is identified. The anterior cerebral arteries are patent. No intracranial aneurysm is identified. Posterior circulation: The intracranial vertebral arteries are patent. The basilar artery is patent. The posterior cerebral arteries are patent. Posterior communicating arteries are hypoplastic or absent bilaterally. Venous sinuses: Within limitations of contrast timing, no convincing thrombus. Anatomic variants: As described Review of the MIP images confirms the above findings IMPRESSION: CT head: 1. Redemonstrated ischemic infarction changes within the right thalamus and thalamocapsular junction, possibly subacute. Consider brain MRI  for further evaluation. 2. Redemonstrated age-indeterminate lacunar infarcts within the basal ganglia bilaterally. 3. 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 4. Mild background cerebral white matter chronic small vessel ischemic disease, which is advanced for age. CTA neck: 1. The common carotid, internal carotid and vertebral arteries are patent within the neck without hemodynamically significant stenosis. Mild calcified plaque within the visualized aortic arch, proximal major branch vessels of the neck and carotid bifurcations. CTA head: 1. No intracranial large vessel occlusion or proximal high-grade arterial stenosis. 2. Mild calcified plaque within both intracranial internal carotid arteries with no more than mild stenosis. Electronically Signed   By: Kellie Simmering DO   On: 04/06/2020 13:18   ECHOCARDIOGRAM COMPLETE BUBBLE STUDY  Result Date: 04/06/2020    ECHOCARDIOGRAM REPORT   Patient Name:   Roberta Calderon Date of Exam: 04/06/2020 Medical Rec #:  [id number]          Height:       70.0 in Accession #:    [id number]         Weight:       137.0 lb Date of Birth:  [birth year]           BSA:          1.777 m Patient Age:    9 years           BP:  175/109 mmHg Patient Gender: F                  HR:           69 bpm. Exam Location:  Inpatient Procedure: 2D Echo, Cardiac Doppler, Color Doppler and Saline Contrast Bubble            Study Indications:    Stroke  History:        Patient has no prior history of Echocardiogram examinations.                 Stroke; Risk Factors:Current Smoker and Hypertension.  Sonographer:    Roseanna Rainbow RDCS Referring Phys: [place]  1. Left ventricular ejection fraction, by estimation, is 65 to 70%. The left ventricle has hyperdynamic function. The left ventricle has no regional wall motion abnormalities. There is moderate left ventricular hypertrophy. Left ventricular diastolic parameters are consistent with Grade I  diastolic dysfunction (impaired relaxation). There was a left ventricular mid-cavity gradient peak 20 mmHg.  2. Right ventricular systolic function is normal. The right ventricular size is normal. Tricuspid regurgitation signal is inadequate for assessing PA pressure.  3. The aortic valve is tricuspid. Aortic valve regurgitation is not visualized. No aortic stenosis is present.  4. The mitral valve is normal in structure. No evidence of mitral valve regurgitation. No evidence of mitral stenosis.  5. The inferior vena cava is normal in size with greater than 50% respiratory variability, suggesting right atrial pressure of 3 mmHg. FINDINGS  Left Ventricle: Left ventricular ejection fraction, by estimation, is 65 to 70%. The left ventricle has hyperdynamic function. The left ventricle has no regional wall motion abnormalities. The left ventricular internal cavity size was normal in size. There is moderate left ventricular hypertrophy. Left ventricular diastolic parameters are consistent with Grade I diastolic dysfunction (impaired relaxation). Right Ventricle: The right ventricular size is normal. No increase in right ventricular wall thickness. Right ventricular systolic function is normal. Tricuspid regurgitation signal is inadequate for assessing PA pressure. Left Atrium: Left atrial size was normal in size. Right Atrium: Right atrial size was normal in size. Pericardium: There is no evidence of pericardial effusion. Mitral Valve: The mitral valve is normal in structure. Mild mitral annular calcification. No evidence of mitral valve regurgitation. No evidence of mitral valve stenosis. Tricuspid Valve: The tricuspid valve is normal in structure. Tricuspid valve regurgitation is not demonstrated. Aortic Valve: The aortic valve is tricuspid. Aortic valve regurgitation is not visualized. No aortic stenosis is present. Pulmonic Valve: The pulmonic valve was normal in structure. Pulmonic valve regurgitation is not  visualized. Aorta: The aortic root is normal in size and structure. Venous: The inferior vena cava is normal in size with greater than 50% respiratory variability, suggesting right atrial pressure of 3 mmHg. IAS/Shunts: No atrial level shunt detected by color flow Doppler. Agitated saline contrast was given intravenously to evaluate for intracardiac shunting.  LEFT VENTRICLE PLAX 2D LVIDd:         3.30 cm     Diastology LVIDs:         2.10 cm     LV e' lateral:   5.66 cm/s LV PW:         1.50 cm     LV E/e' lateral: 9.8 LV IVS:        1.50 cm     LV e' medial:    4.46 cm/s LVOT diam:     1.90 cm  LV E/e' medial:  12.4 LV SV:         63 LV SV Index:   35 LVOT Area:     2.84 cm  LV Volumes (MOD) LV vol d, MOD A2C: 70.7 ml LV vol d, MOD A4C: 57.7 ml LV vol s, MOD A2C: 14.8 ml LV vol s, MOD A4C: 12.1 ml LV SV MOD A2C:     55.9 ml LV SV MOD A4C:     57.7 ml LV SV MOD BP:      51.1 ml RIGHT VENTRICLE             IVC RV S prime:     13.30 cm/s  IVC diam: 1.80 cm TAPSE (M-mode): 2.5 cm LEFT ATRIUM             Index       RIGHT ATRIUM           Index LA diam:        2.90 cm 1.63 cm/m  RA Area:     10.10 cm LA Vol (A2C):   43.8 ml 24.64 ml/m RA Volume:   20.20 ml  11.37 ml/m LA Vol (A4C):   37.8 ml 21.27 ml/m LA Biplane Vol: 41.0 ml 23.07 ml/m  AORTIC VALVE LVOT Vmax:   112.00 cm/s LVOT Vmean:  74.500 cm/s LVOT VTI:    0.221 m  AORTA Ao Root diam: 2.70 cm Ao Asc diam:  3.20 cm MITRAL VALVE MV Area (PHT): 1.86 cm    SHUNTS MV Decel Time: 408 msec    Systemic VTI:  0.22 m MV E velocity: 55.20 cm/s  Systemic Diam: 1.90 cm MV A velocity: 79.20 cm/s MV E/A ratio:  0.70 Loralie Champagne MD Electronically signed by Loralie Champagne MD Signature Date/Time: 04/06/2020/7:27:36 PM    Final      Assessment/Plan: Diagnosis: R thalamic, R basal ganglia, 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 Does the need for close, 24 hr/day medical supervision in concert with the patient's rehab needs make it unreasonable for  this patient to be served in a less intensive setting? Yes 2. Co-Morbidities requiring supervision/potential complications: uncontrolled HTN< smoker, strokes- sedation, questionable agitation 3. Due to bladder management, safety, skin/wound care, disease management, medication administration, pain management and patient education, does the patient require 24 hr/day rehab nursing? Yes 4. Does the patient require coordinated care of a physician, rehab nurse, therapy disciplines of PT, OT, and SLP? to address physical and functional deficits in the context of the above medical diagnosis(es)? Yes Addressing deficits in the following areas: balance, endurance, locomotion, strength, transferring, bathing, dressing, feeding, grooming, toileting and cognition 5. Can the patient actively participate in an intensive therapy program of at least 3 hrs of therapy per day at least 5 days per week? Yes 6. The potential for patient to make measurable gains while on inpatient rehab is good 7. Anticipated functional outcomes upon discharge from inpatient rehab are supervision and min assist  with PT, supervision with OT, supervision with SLP. 8. Estimated rehab length of stay to reach the above functional goals is: 7-10 days? 9. Anticipated discharge destination: Home 10. Overall Rehab/Functional Prognosis: good  RECOMMENDATIONS: This patient's condition is appropriate for continued rehabilitative care in the following setting: CIR Patient has agreed to participate in recommended program. Potentially Note that insurance prior authorization may be required for reimbursement for recommended care.  Comment:  1. Pt hasn't finished her CVA work up hasn't had ECHO, MRI, and  no note for today so far- we saw early 2. Pt was (+) for benzo's and cocaine- likely cause 3. Pt - not sure if has help to go home/be discharged safely- and pt was disinclined to answer questions.  4. Of note, pt interested in disability- asking  about it- explained only if has disability past 1 year- explained.  5. Will submit for admissions coordinator care to check into ability to have someone care for her after d/c; do family education.  6. Thank you for this consult.   Lavon Paganini Angiulli, PA-C 04/07/2020    I have personally performed a face to face diagnostic evaluation of this patient and formulated the key components of the plan.  Additionally, I have personally reviewed laboratory data, imaging studies, as well as relevant notes and concur with the physician assistant's documentation above.

## 2020-04-07 NOTE — Progress Notes (Signed)
Rehab Admissions Coordinator Note:  Patient was screened by Cleatrice Burke for appropriateness for an Inpatient Acute Rehab Consult per therapy recs. .  At this time, we are recommending Inpatient Rehab consult I will contact  Dr. Shon Baton for order.Cleatrice Burke RN MSN 04/07/2020, 10:50 AM  I can be reached at [phone].

## 2020-04-07 NOTE — Evaluation (Addendum)
Occupational Therapy Evaluation Patient Details Name: Roberta Calderon MRN: [id number] DOB: [birth year] Today's Date: 04/07/2020    History of Present Illness 49 year old woman with medical history significant for hypertension-uncontrolled, tobacco use disorder who presented with left-sided weakness.   Clinical Impression   PTA patient independent. Admitted for above and limited by problem list below, including decreased sensation, strength and coordination of L UE/LE, impaired balance, dizziness with mobility, blurry vision, and impaired cognition.  Limited cognitive eval, due to easily agitated, but pt following simple commands with increased time.  Noted poor awareness to safety.  Patient subdued during session, but RN reports pt receiving ativan last night. She currently requires min guard for bed mobility, min assist for transfers, and min assist to supervision for ADLs; several LOB with mobility and requires mod assist +2 for safety. Patient will benefit from continued OT services while admitted and after dc at CIR level to optimize return to PLOF with ADLs, mobility.     Follow Up Recommendations  CIR    Equipment Recommendations  Other (comment) (TBD at next venue of care)    Recommendations for Other Services Rehab consult     Precautions / Restrictions Precautions Precautions: Fall Restrictions Weight Bearing Restrictions: No      Mobility Bed Mobility Overal bed mobility: Needs Assistance Bed Mobility: Supine to Sit;Sit to Supine     Supine to sit: HOB elevated;Min guard Sit to supine: Min guard   General bed mobility comments: increased time, min guard for safety  Transfers Overall transfer level: Needs assistance Equipment used: 2 person hand held assist Transfers: Sit to/from Stand Sit to Stand: +2 safety/equipment;Min assist         General transfer comment: assist to power up and stabilize balance    Balance Overall balance assessment: Needs  assistance Sitting-balance support: No upper extremity supported;Feet supported Sitting balance-Leahy Scale: Fair     Standing balance support: During functional activity;Bilateral upper extremity supported Standing balance-Leahy Scale: Poor Standing balance comment: reliant on external support                           ADL either performed or assessed with clinical judgement   ADL Overall ADL's : Needs assistance/impaired     Grooming: Min guard;Standing Grooming Details (indicate cue type and reason): increased time to manage items with L hand, min guard standing  Upper Body Bathing: Set up;Sitting   Lower Body Bathing: Sit to/from stand;Minimal assistance   Upper Body Dressing : Minimal assistance;Sitting   Lower Body Dressing: Minimal assistance;Sit to/from stand   Toilet Transfer: Minimal assistance;Ambulation Toilet Transfer Details (indicate cue type and reason): simulated in room         Functional mobility during ADLs: Minimal assistance;Moderate assistance;Cueing for safety General ADL Comments: pt limited by L sided weakness, impaired balance and decreased safety awareness      Vision Baseline Vision/History: No visual deficits Patient Visual Report: Blurring of vision Additional Comments: able to locate items in hallway, able to read room numbers; no formal assessment completed     Perception     Praxis      Pertinent Vitals/Pain Pain Assessment: Faces Faces Pain Scale: Hurts a little bit Pain Location: LLE Pain Descriptors / Indicators: Sore Pain Intervention(s): Limited activity within patient's tolerance;Monitored during session;Repositioned     Hand Dominance Right   Extremity/Trunk Assessment Upper Extremity Assessment Upper Extremity Assessment: LUE deficits/detail LUE Deficits / Details: grossly 3/5 MMT, decreased coordination and  sensation  LUE Sensation: decreased light touch LUE Coordination: decreased fine motor;decreased  gross motor   Lower Extremity Assessment Lower Extremity Assessment: Defer to PT evaluation LLE Deficits / Details: Pt reporting pain, numbness and weakness. Able to move through full AROM. Pt not agreeable to MMT. LLE Sensation: decreased proprioception LLE Coordination: decreased gross motor   Cervical / Trunk Assessment Cervical / Trunk Assessment: Normal   Communication Communication Communication: No difficulties   Cognition Arousal/Alertness: Awake/alert Behavior During Therapy: Flat affect Overall Cognitive Status: No family/caregiver present to determine baseline cognitive functioning                                 General Comments: Subdued, most likely due to receiving Ativan overnight. Following simple commands consistently. Decreased safety awareness. Further cognitive assessment required, pt with minimal verbalizations and decreased engagement with therapist (attempted cog screen, but pt declined to answer therapist)   General Comments  VSS, mild dizziness with mobility    Exercises     Shoulder Instructions      Home Living Family/patient expects to be discharged to:: Shelter/Homeless                                        Prior Functioning/Environment Level of Independence: Independent                 OT Problem List: Decreased strength;Decreased activity tolerance;Impaired balance (sitting and/or standing);Decreased range of motion;Impaired vision/perception;Decreased safety awareness;Decreased cognition;Decreased coordination;Decreased knowledge of use of DME or AE;Decreased knowledge of precautions;Pain;Impaired UE functional use;Impaired sensation      OT Treatment/Interventions: Self-care/ADL training;Neuromuscular education;DME and/or AE instruction;Therapeutic activities;Cognitive remediation/compensation;Visual/perceptual remediation/compensation;Balance training;Patient/family education    OT Goals(Current goals can  be found in the care plan section) Acute Rehab OT Goals Patient Stated Goal: not stated Time For Goal Achievement: 04/21/20 Potential to Achieve Goals: Good  OT Frequency: Min 2X/week   Barriers to D/C:            Co-evaluation PT/OT/SLP Co-Evaluation/Treatment: Yes Reason for Co-Treatment: Necessary to address cognition/behavior during functional activity;For patient/therapist safety PT goals addressed during session: Mobility/safety with mobility;Balance OT goals addressed during session: ADL's and self-care      AM-PAC OT "6 Clicks" Daily Activity     Outcome Measure Help from another person eating meals?: A Little Help from another person taking care of personal grooming?: A Little Help from another person toileting, which includes using toliet, bedpan, or urinal?: A Little Help from another person bathing (including washing, rinsing, drying)?: A Little Help from another person to put on and taking off regular upper body clothing?: A Little Help from another person to put on and taking off regular lower body clothing?: A Little 6 Click Score: 18   End of Session Equipment Utilized During Treatment: Gait belt Nurse Communication: Mobility status  Activity Tolerance: Patient tolerated treatment well Patient left: in bed;with call bell/phone within reach;with bed alarm set  OT Visit Diagnosis: Other abnormalities of gait and mobility (R26.89);Other symptoms and signs involving the nervous system (R29.898);Hemiplegia and hemiparesis Hemiplegia - Right/Left: Left Hemiplegia - dominant/non-dominant: Non-Dominant Hemiplegia - caused by: Cerebral infarction                Time: 1779-3903 OT Time Calculation (min): 15 min Charges:  OT General Charges $OT Visit: 1 Visit OT Evaluation $OT  Eval Moderate Complexity: 1 Mod  Jolaine Artist, OT Acute Rehabilitation Services Pager [phone] Office [phone]   Roberta Calderon 04/07/2020, 9:51 AM

## 2020-04-07 NOTE — Progress Notes (Addendum)
STROKE TEAM PROGRESS NOTE   Interval History: She is resting in bed and is reluctant to participate in the neurological examination. She states that her left side has been hurting ever since she had her stroke. No family at bedside.    Vitals:   04/07/20 0354 04/07/20 0730 04/07/20 0800 04/07/20 1224  BP: (!) 156/104 (!) 180/102 (!) 162/99 (!) 172/104  Pulse: 84 65  86  Resp: 19 18  18   Temp: 97.6 F (36.4 C) 97.7 F (36.5 C)  98.2 F (36.8 C)  TempSrc:  Oral  Oral  SpO2: 99% 100%    Height:       Stroke Labs: 04/05/20 Lipid Panel: Total cholesterol 254,Triglycerides 95, HDL 62, LDL 173  04/05/20 Hemoglobin A1C: 6.3 04/07/20 UDS: Positive for Cocaine and Benzodiazepines   Pertinent Imaging:   04/05/20 CT Head WO Contrast:  Scattered areas of decreased attenuation are noted involving the right thalamus, right basal ganglia and right deep white matter in the parietal lobe consistent with acute to subacute ischemia. Scattered areas of decreased attenuation on the right as described consistent with likely subacute ischemia given the clinical history.  04/06/20 CT Angio Head and Neck: CTA neck: The common carotid, internal carotid and vertebral arteries are patent within the neck without hemodynamically significant stenosis. Mild calcified plaque within the visualized aortic arch, proximal major branch vessels of the neck and carotid bifurcations.  04/05/20 CTA head: No intracranial large vessel occlusion or proximal high-grade arterial stenosis. Mild calcified plaque within both intracranial internal carotid arteries with no more than mild stenosis.  04/06/20 Echocardiogram Complete w/Bubble Study: 1. Left ventricular ejection fraction, by estimation, is 65 to 70%. The left ventricle has hyperdynamic function. The left ventricle has no regional wall motion abnormalities. There is moderate left ventricular  hypertrophy. Left ventricular diastolic parameters are consistent with Grade I  diastolic dysfunction (impaired relaxation). There was a left ventricular mid-cavity gradient peak 20 mmHg.  2. Right ventricular systolic function is normal. The right ventricular size is normal. Tricuspid regurgitation signal is inadequate for assessing PA pressure.  3. The aortic valve is tricuspid. Aortic valve regurgitation is not visualized. No aortic stenosis is present.  4. The mitral valve is normal in structure. No evidence of mitral valve regurgitation. No evidence of mitral stenosis.  5. The inferior vena cava is normal in size with greater than 50% respiratory variability, suggesting right atrial pressure of 3 mmHg.   Physical Examination  Constitutional: Alert, resting in bed, resistant to physical examination with poor cooperation, has to be pushed to participate  Neurological Examination MS: AAOx4 Speech: Fluent with repetition and naming intact, no dysarthria  Cranial nerves: EOMI appear to be intact, difficult to say as she keeps her eyes half closed while examining, VFF, PERRL ( lids held up by examiner), Face symmetric, V1/V2/V3 sensation intact  Strength: RUE + RLE 5/5, LLE is 5/5 except for Knee Extension which is 4/5, RLE 5/5  Coordination: Intact FNF and HTS  Sensation: Decreased sensation to LUE + LLE  Gait: Deferred  NIHSS: 1 for sensation deficit to the left side    Assessment and Plan:  Roberta Calderon is a 49 y.o. female w/pmh of HTN, tobacco abuse (1 pack/day) alcohol use disorder (reportedly 6 beers per day), homelessness who presents with 1 week of left sided weakness, found to have a acute to subacute strokes in the right thalamus, right basal ganglia and deep white matter of the right parietal lobe as seen on  CTH. Given her symptoms started a week prior to presentation she was outside the time window for IVTPA + Thrombectomy.   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 The patient originally refused to have an MRI due to being claustrophobic. She later agreed on 04/07/20 to have one done. Her CTA Head and Neck was relatively unremarkable with no significant stenosis noted. Echocardiogram revealed an EF of 65-70 %, hyperdynamic function of the left ventricle, no intracardiac thrombus. Stroke labs were notable for LDL of 173 and A1C 6.3. More than likely, her strokes are in the setting of small vessel disease due to smoking, HTN and HLD. She was started on DAPT and Atorvastatin while hospitalized for secondary stroke prevention.  - Aspirin 81 mg + Plavix 75 mg for 21 days followed by Aspirin 81 mg monotherapy  - Continue Atorvastatin 80 mg for secondary stroke prevention, goal LDL is < 70 her LDL is 173 - MRI ordered, f/u on results  - PT/OT have evaluated her and recommend CIR.  CARDS #HTN  Her symptoms have been going on for a week prior to presenting to the hospital and she is outside of the permissive HTN. That said, her blood pressure can be lowered making sure to avoid significant acute drops. Over the past 24 hours her SBP has trended 150-190. Amlodipine 10 mg was initiated today to help with lowering blood pressure. Her IV PRN was adjusted to be given for SBP > 180, was previously ordered to treat for SBP >220. Her long term blood pressure goal is < 140/90. - Continue Amlodipine 10 mg and initiate more anti htns as necessary to normalize blood pressure  - IV Labetalol 5 mg Q4 HR PRN for SBP > 180  ENDO  #Prediabetes Her A1C this admission was noted to be 6.3, and is at goal of <7. Recommend following up with her PCP for prediabetes. Can initiate SSI as necessary while hospitalized.   GI  She is on a heart healthy diet w/thin liquids, no dysphagia issues   HEME #DVT Prophylaxis  Continue Lovenox 40 mg SQ for DVT Prophylaxis   Hospital day # 1   Neurology will continue to follow at this time given pending MRI results   [name], NP  [facility] Nurse Practitioner Patient seen and discussed with attending physician Dr. Erlinda Hong   ATTENDING NOTE: I reviewed above note and agree with the assessment and plan. Pt was seen and examined.   49 year old homeless female with history of hypertension, smoker, alcohol user, substance abuse admitted for left-sided weakness, blurry vision for 1 week.  CT showed possible right internal capsule infarct with chronic bilateral BG infarcts.  CTA head neck unremarkable.  Patient initially refused MRI, however, now agree with MRI.  EF 65 to 70%.  UDS positive for cocaine and benzo.  HIV and RPR negative.  LDL 173, A1c 6.3.  On exam, patient awake alert fully orientated.  Still has left facial droop and left hand weakness with dexterity difficulty.  Left foot DF 4/5.  Decree sensation on the left, 90% about the right.  Patient stroke etiology likely small vessel disease, due to uncontrolled risk factors including smoking, hypertension, alcohol use and cocaine use.  Currently on aspirin Plavix DAPT for 3 weeks and then aspirin alone.  Continue Lipitor 80.  Aggressive stroke risk factor modification.  Will follow.  Rosalin Hawking, MD PhD Stroke  Neurology 04/07/2020 6:53 PM     To contact Stroke Continuity provider, please refer to [email or web address] After hours, contact General Neurology

## 2020-04-07 NOTE — Evaluation (Signed)
Physical Therapy Evaluation Patient Details Name: Roberta Calderon MRN: [id number] DOB: Apr 04, 1971 Today's Date: 04/07/2020   History of Present Illness  49 year old woman with medical history significant for hypertension-uncontrolled, tobacco use disorder who presented with left-sided weakness.    Clinical Impression  Pt admitted with above diagnosis. PTA pt was independent. On eval, she required min guard assist bed mobility, min assist transfers, and mod assist ambulation 130' HHA. +2 needed for safety. She presents with decreased strength, sensation and coordination LUE/LE as well as deficits in balance and cognition. Pt will benefit from skilled PT to increase their independence and safety with mobility to allow discharge to the venue listed below.       Follow Up Recommendations CIR    Equipment Recommendations  Other (comment) (TBD)    Recommendations for Other Services Rehab consult     Precautions / Restrictions Precautions Precautions: Fall      Mobility  Bed Mobility Overal bed mobility: Needs Assistance Bed Mobility: Supine to Sit;Sit to Supine     Supine to sit: HOB elevated;Min guard Sit to supine: Min guard   General bed mobility comments: increased time, min guard for safety  Transfers Overall transfer level: Needs assistance Equipment used: 2 person hand held assist Transfers: Sit to/from Stand Sit to Stand: +2 safety/equipment;Min assist         General transfer comment: assist to power up and stabilize balance  Ambulation/Gait Ambulation/Gait assistance: Mod assist;+2 safety/equipment Gait Distance (Feet): 130 Feet Assistive device: 2 person hand held assist Gait Pattern/deviations: Step-through pattern;Drifts right/left Gait velocity: decreased Gait velocity interpretation: 1.31 - 2.62 ft/sec, indicative of limited community ambulator General Gait Details: multiple episodes of LOB requiring mod assist to maintain balance, increasing in  frequency with fatigue  Stairs            Wheelchair Mobility    Modified Rankin (Stroke Patients Only) Modified Rankin (Stroke Patients Only) Pre-Morbid Rankin Score: No symptoms Modified Rankin: Moderately severe disability     Balance Overall balance assessment: Needs assistance Sitting-balance support: No upper extremity supported;Feet supported Sitting balance-Leahy Scale: Fair     Standing balance support: During functional activity;Bilateral upper extremity supported Standing balance-Leahy Scale: Poor Standing balance comment: reliant on external support                             Pertinent Vitals/Pain Pain Assessment: Faces Faces Pain Scale: Hurts a little bit Pain Location: LLE Pain Descriptors / Indicators: Sore Pain Intervention(s): Limited activity within patient's tolerance;Monitored during session    Home Living Family/patient expects to be discharged to:: Shelter/Homeless                      Prior Function Level of Independence: Independent               Hand Dominance   Dominant Hand: Right    Extremity/Trunk Assessment   Upper Extremity Assessment Upper Extremity Assessment: Defer to OT evaluation    Lower Extremity Assessment Lower Extremity Assessment: LLE deficits/detail LLE Deficits / Details: Pt reporting pain, numbness and weakness. Able to move through full AROM. Pt not agreeable to MMT. LLE Sensation: decreased proprioception LLE Coordination: decreased gross motor    Cervical / Trunk Assessment Cervical / Trunk Assessment: Normal  Communication   Communication: No difficulties  Cognition Arousal/Alertness: Awake/alert Behavior During Therapy: Flat affect Overall Cognitive Status: No family/caregiver present to determine baseline cognitive functioning  General Comments: Subdued, most likely due to receiving Ativan overnight. Following simple commands  consistently. Decreased safety awareness.      General Comments General comments (skin integrity, edema, etc.): VSS    Exercises     Assessment/Plan    PT Assessment Patient needs continued PT services  PT Problem List Decreased strength;Decreased mobility;Decreased safety awareness;Decreased activity tolerance;Decreased balance;Pain;Decreased knowledge of precautions;Decreased coordination       PT Treatment Interventions Therapeutic activities;DME instruction;Cognitive remediation;Gait training;Therapeutic exercise;Patient/family education;Balance training;Stair training;Functional mobility training;Neuromuscular re-education    PT Goals (Current goals can be found in the Care Plan section)  Acute Rehab PT Goals Patient Stated Goal: not stated PT Goal Formulation: With patient Time For Goal Achievement: 04/21/20 Potential to Achieve Goals: Good    Frequency Min 4X/week   Barriers to discharge        Co-evaluation PT/OT/SLP Co-Evaluation/Treatment: Yes Reason for Co-Treatment: Necessary to address cognition/behavior during functional activity;For patient/therapist safety PT goals addressed during session: Mobility/safety with mobility;Balance         AM-PAC PT "6 Clicks" Mobility  Outcome Measure Help needed turning from your back to your side while in a flat bed without using bedrails?: None Help needed moving from lying on your back to sitting on the side of a flat bed without using bedrails?: None Help needed moving to and from a bed to a chair (including a wheelchair)?: A Little Help needed standing up from a chair using your arms (e.g., wheelchair or bedside chair)?: A Little Help needed to walk in hospital room?: A Lot Help needed climbing 3-5 steps with a railing? : A Lot 6 Click Score: 18    End of Session Equipment Utilized During Treatment: Gait belt Activity Tolerance: Patient tolerated treatment well Patient left: in bed;with call bell/phone within  reach;with bed alarm set Nurse Communication: Mobility status PT Visit Diagnosis: Unsteadiness on feet (R26.81);Difficulty in walking, not elsewhere classified (R26.2)    Time: 2878-6767 PT Time Calculation (min) (ACUTE ONLY): 18 min   Charges:   PT Evaluation $PT Eval Moderate Complexity: 1 Mod          Lorrin Goodell, PT  Office # [phone] Pager [phone]   Roberta Calderon 04/07/2020, 9:23 AM

## 2020-04-07 NOTE — Evaluation (Signed)
Speech Language Pathology Evaluation Patient Details Name: Roberta Calderon MRN: [id number] DOB: [birth year] Today's Date: 04/07/2020 Time: 4098-1191 SLP Time Calculation (min) (ACUTE ONLY): 25 min  Problem List:  Patient Active Problem List   Diagnosis Date Noted  . Ischemic stroke ([place]) 04/06/2020  . Hypertension 04/06/2020  . Tobacco use disorder 04/06/2020   Past Medical History:  Past Medical History:  Diagnosis Date  . Hypertension    Past Surgical History: History reviewed. No pertinent surgical history. HPI:  49yo female admitted 04/05/20 with left side weakness, intermittent dizziness. PMH: uncontrolled HTN, tobacco/alcohol abuse, homelessness. Head CT = decreased attenuation in the R thalamus, R basal ganglia, R deep white matter parietal lobe c/s (sub)acute ischemia. MRI pending.   Assessment / Plan / Recommendation Clinical Impression  The [place] Mental Status (SLUMS) Examination was administered. Pt reports 12th grade education, and was working part time at [employer] prior to admit. Pt scored 17/30 on SLUMS, raising concern for neurocognitive impairment. Points were lost on the following subtests: thought organization, delayed recall/recognition, digit reversal, clock drawing, and auditory attention and recall. No family was present today to discuss previous level of cognition.  Further evaluation was deferred at this time, as pt began to exhibit signs of irritation, pushing away materials SLP had given her. Continued ST intervention is recommended at next level of care based on performance/deficits identified today, however, pt cooperation with extended sessions for cognitive remediation is questionable.    SLP Assessment  SLP Recommendation/Assessment: All further Speech Language Pathology needs can be addressed in the next venue of care  SLP Visit Diagnosis: Cognitive communication deficit (R41.841)    Follow Up Recommendations  Inpatient Rehab        SLP Evaluation Cognition  Overall Cognitive Status: No family/caregiver present to determine baseline cognitive functioning Arousal/Alertness: Awake/alert Orientation Level: Oriented X4 Attention: Focused;Sustained;Selective Focused Attention: Appears intact Sustained Attention: Impaired Sustained Attention Impairment: Verbal basic Selective Attention: Impaired Selective Attention Impairment: Verbal basic;Functional basic Memory: Impaired (recalled 2/5 words, did not recognize the other 3 given multiple choice) Behaviors: Poor frustration tolerance Comments: 17/30 SLUMS       Comprehension  Auditory Comprehension Overall Auditory Comprehension: Appears within functional limits for tasks assessed    Expression Expression Primary Mode of Expression: Verbal Verbal Expression Overall Verbal Expression: Appears within functional limits for tasks assessed   Oral / Motor  Oral Motor/Sensory Function Overall Oral Motor/Sensory Function: Within functional limits Motor Speech Overall Motor Speech: Appears within functional limits for tasks assessed   GO                   Roberta Calderon Roberta Calderon, [facility], [place] Speech Language Pathologist Office: [phone]  Roberta Calderon 04/07/2020, 4:48 PM

## 2020-04-07 NOTE — Progress Notes (Signed)
Subjective:   No acute events overnight. Patient was evaluated at the bedside this morning. States she is feeling numb on the left side and this is unchanged over the last week. Reports she hasn't been sleeping well at home or in the hospital. Endorses a little headache which is new since being in the hospital. Denies chest pain or shortness of breath.  Patient states she does not have a PCP. States the last time she saw a doctor was when when she came to the ED. On chart review, she was seen in September 2018 for foot pain. Reports that she used to smoke a ppd of cigarette but now down to 1/2ppd.  Amenable to receiving more help with smoking cessation. Discussed the results of her CT head, that she has had a stroke. She was informed that she will need rehab in the hospital to get stronger.    Objective:  Vital signs in last 24 hours: Vitals:   04/07/20 0000 04/07/20 0354 04/07/20 0730 04/07/20 0800  BP: (!) 165/101 (!) 156/104 (!) 180/102 (!) 162/99  Pulse: 81 84 65   Resp:  19 18   Temp: 97.8 F (36.6 C) 97.6 F (36.4 C) 97.7 F (36.5 C)   TempSrc: Axillary  Oral   SpO2: 100% 99% 100%   Height:       Physical Exam: Constitutional: Somnolent middle-aged woman. No acute distress. Turns her body away during exam. HENT: [place]/AT Eyes: No sclera icterus Neck: Supple. Normal ROM Cardiovascular: RRR. No m/r/g. No LE edema.  Pulmonary/Chest: Lungs CTAB. Normal effort. No wheezes or crackles Abdominal: Soft, non-tender, non-distended. Normal bowel sounds. MSK: Normal ROM Neuro: A&Ox3. Moves all extremities. Strength 5/5 on RUE and RLE, 3/5 on LUE, 4/5 on LLE. Decreased senstation on LUE and LLE.  Skin: No obvious rashes or lesions Psych: Sleepy. Seems disinterested.    Assessment/Plan:  Principal Problem:   Ischemic stroke [facility]) Active Problems:   Hypertension   Tobacco use disorder  Roberta Calderon is a 49 year old woman w/ PMH of uncontrolled HTN, alcohol and tobacco use  disorder who presented with 1 week of left-sided weakness and found to have subacute ischemia involving the right thalamus, right basal ganglia and right deep white matter on CT head.    Acute CVA involving R thalamus, R basal ganglia and R deep white matter Presented with 1 wk of left-sided weakness and paresthesias. CT head shows scattered areas of decreased attenuation on the right involving the R thalamus, R basal ganglia and R deep white matter in the parietal lobe consistent with acute to subacute ischemia. CTA neck and head show mild calcified plaque within both intracranial internal carotid arteries with no more than mild stenosis. ECHO shows EF of 65-70% with grade I diastolic dysfunction. The most likely etiology for her stroke is uncontrolled hypertension and continued tobacco and alcohol use. Refused to get MRI done on admission. Agrees to try again today.  --Neurology consulted, appreciate recs --Continue ASA 81 mg and Plavix 75 mg for 3 weeks, then ASA alone --Continue Lipitor 80 mg daily -- Allow for permissive HTN --LDL 173, A1c 6.3. --Normal HIV, RPR -- Echo EF 50-09%, grade 1 diastolic dysfunction -- CTA head & neck unremarkable -- Pending repeat MRI brain  --Continue tele monitoring  --PMR recommending inpatient rehab  Polycythemia Presents with thrombotic events and found to have elevated hemoglobin of 15.2 with repeat of 17.3, hematocrit of 48.7 with repeat of 51. Likely 2/2 to her extensive smoking history or polycythemia  vera. Given this, will go ahead and order JAK2 mutation and erythropoietin level --Normal erythropoietin level --Follow-up JAK2 mutation level  Hypertension-uncontrolled Takes hydrochlorothiazide 25 mg daily though unsure about compliance. BP elevated to allow for brain perfusion -Allow for permissive hypertension --Hold BP medicine  Polysubstance use Patient with history of substance use including extensive history of alcohol. UDS positive for  cocaine and benzos. Hemodynamically stable. --CIWA protocol with ativan --Daily vitals  Tobacco use disorder History of smoking 1 ppd for years and now down to 1/2 ppd. Interested in smoking cessation resources. --Continue NicoDerm   Prior to Admission Living Arrangement: Home Anticipated Discharge Location: Inpatient rehab Barriers to Discharge: Pending medical work up Dispo: Anticipated discharge in approximately 2-3 day(s).   Lacinda Axon, MD 04/07/2020, 9:20 PM Pager: [phone] Internal Medicine Teaching Service After 5pm on weekdays and 1pm on weekends: On Call pager [phone]

## 2020-04-08 ENCOUNTER — Inpatient Hospital Stay (HOSPITAL_COMMUNITY): Payer: Self-pay

## 2020-04-08 ENCOUNTER — Inpatient Hospital Stay (HOSPITAL_COMMUNITY)
Admission: RE | Admit: 2020-04-08 | Discharge: 2020-04-13 | DRG: 057 | Disposition: A | Discharge status: Home Health | Payer: Self-pay | Source: Intra-hospital | Attending: Physical Medicine & Rehabilitation | Admitting: Physical Medicine & Rehabilitation

## 2020-04-08 DIAGNOSIS — I69322 Dysarthria following cerebral infarction: Secondary | ICD-10-CM

## 2020-04-08 DIAGNOSIS — F1910 Other psychoactive substance abuse, uncomplicated: Secondary | ICD-10-CM

## 2020-04-08 DIAGNOSIS — I10 Essential (primary) hypertension: Secondary | ICD-10-CM | POA: Diagnosis present

## 2020-04-08 DIAGNOSIS — I639 Cerebral infarction, unspecified: Secondary | ICD-10-CM

## 2020-04-08 DIAGNOSIS — R739 Hyperglycemia, unspecified: Secondary | ICD-10-CM | POA: Diagnosis not present

## 2020-04-08 DIAGNOSIS — Z886 Allergy status to analgesic agent status: Secondary | ICD-10-CM

## 2020-04-08 DIAGNOSIS — E785 Hyperlipidemia, unspecified: Secondary | ICD-10-CM | POA: Diagnosis present

## 2020-04-08 DIAGNOSIS — I69354 Hemiplegia and hemiparesis following cerebral infarction affecting left non-dominant side: Secondary | ICD-10-CM

## 2020-04-08 DIAGNOSIS — D179 Benign lipomatous neoplasm, unspecified: Secondary | ICD-10-CM | POA: Diagnosis present

## 2020-04-08 DIAGNOSIS — R7303 Prediabetes: Secondary | ICD-10-CM

## 2020-04-08 DIAGNOSIS — R079 Chest pain, unspecified: Secondary | ICD-10-CM | POA: Diagnosis not present

## 2020-04-08 DIAGNOSIS — F1410 Cocaine abuse, uncomplicated: Secondary | ICD-10-CM | POA: Diagnosis present

## 2020-04-08 DIAGNOSIS — F17200 Nicotine dependence, unspecified, uncomplicated: Secondary | ICD-10-CM

## 2020-04-08 DIAGNOSIS — Z79899 Other long term (current) drug therapy: Secondary | ICD-10-CM

## 2020-04-08 DIAGNOSIS — F17210 Nicotine dependence, cigarettes, uncomplicated: Secondary | ICD-10-CM | POA: Diagnosis present

## 2020-04-08 DIAGNOSIS — I6381 Other cerebral infarction due to occlusion or stenosis of small artery: Secondary | ICD-10-CM | POA: Diagnosis present

## 2020-04-08 LAB — CBC
HCT: 45.3 % (ref 36.0–46.0)
Hemoglobin: 14 g/dL (ref 12.0–15.0)
MCH: 25.1 pg — ABNORMAL LOW (ref 26.0–34.0)
MCHC: 30.9 g/dL (ref 30.0–36.0)
MCV: 81.3 fL (ref 80.0–100.0)
Platelets: 316 10*3/uL (ref 150–400)
RBC: 5.57 MIL/uL — ABNORMAL HIGH (ref 3.87–5.11)
RDW: 13.3 % (ref 11.5–15.5)
WBC: 5.7 10*3/uL (ref 4.0–10.5)
nRBC: 0 % (ref 0.0–0.2)

## 2020-04-08 LAB — CREATININE, SERUM
Creatinine, Ser: 0.86 mg/dL (ref 0.44–1.00)
GFR calc Af Amer: >60 mL/min (ref >60)
GFR calc non Af Amer: >60 mL/min (ref >60)

## 2020-04-08 LAB — VITAMIN B1: Vitamin B1 (Thiamine): 67.8 nmol/L (ref 66.5–200.0)

## 2020-04-08 IMAGING — MR MR HEAD W/O CM
4 series · 48 of 48 positions shown · non-contrast
Comparison: Prior CTs from [DATE] and [DATE].

CLINICAL DATA: Initial evaluation for acute left-sided weakness for
1 week.

EXAM:
MRI HEAD WITHOUT CONTRAST
TECHNIQUE: Multiplanar, multiecho pulse sequences of the brain and surrounding
structures were obtained without intravenous contrast.

[Series 5: DWI · axial · 3.0mm · 0.88mm/px · z∈[-64,+71]mm · 19 of 96 slices shown (1 of 4)]
[im 1/96]
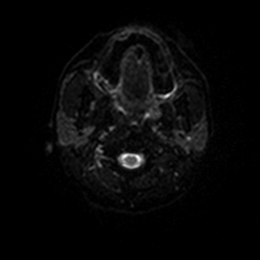
[im 6/96]
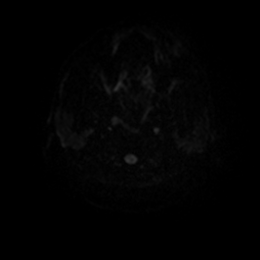
[im 11/96]
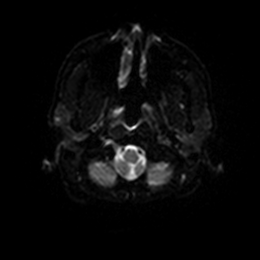
[im 16/96]
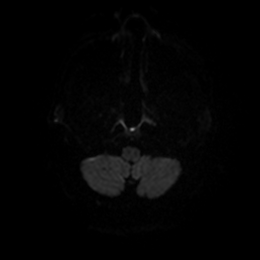
[im 22/96]
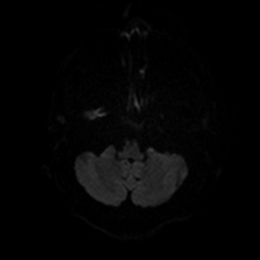
[im 27/96]
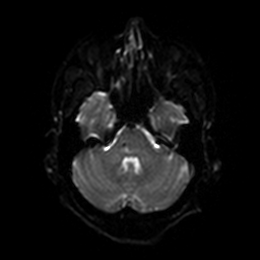
[im 32/96]
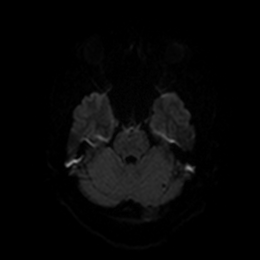
[im 37/96]
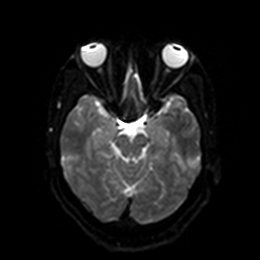
[im 43/96]
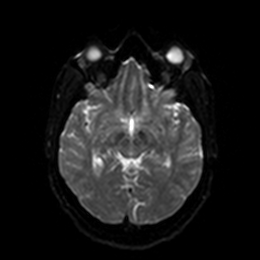
[im 48/96]
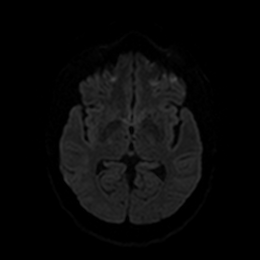
[im 53/96]
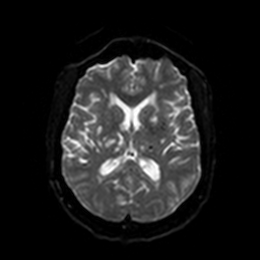
[im 59/96]
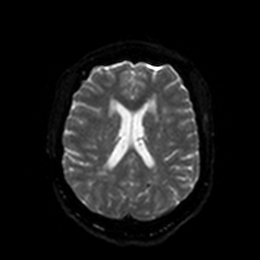
[im 64/96]
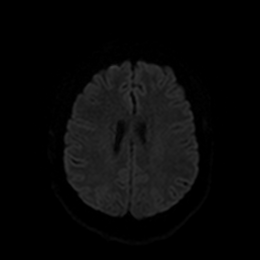
[im 69/96]
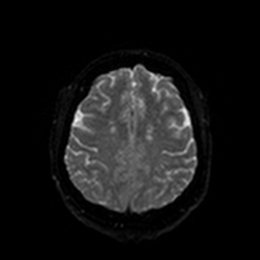
[im 74/96]
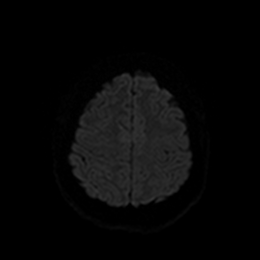
[im 80/96]
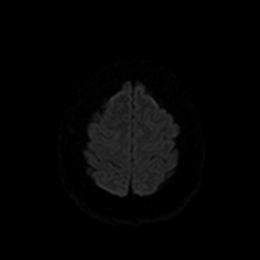
[im 85/96]
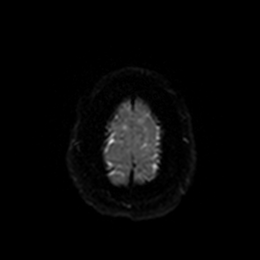
[im 90/96]
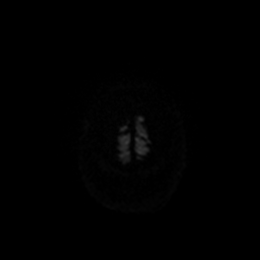
[im 96/96]
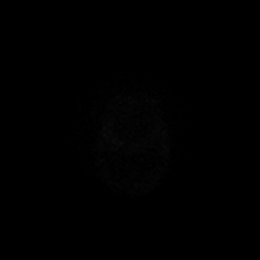

[Series 6: DWI · axial · 3.0mm · 0.88mm/px · z∈[-64,+71]mm · 10 of 48 slices shown (2 of 4)]
[im 1/48]
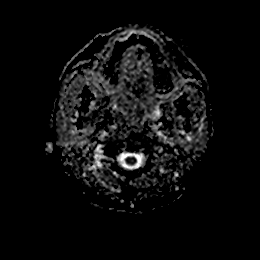
[im 6/48]
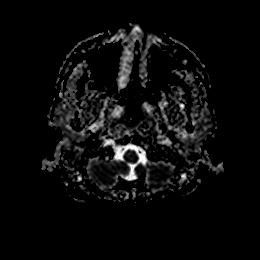
[im 11/48]
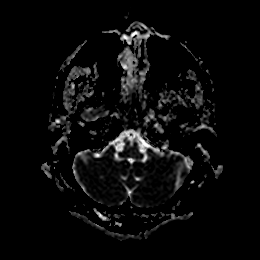
[im 16/48]
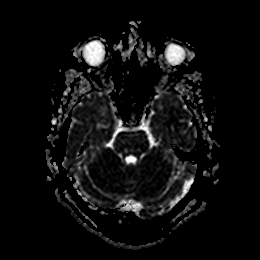
[im 21/48]
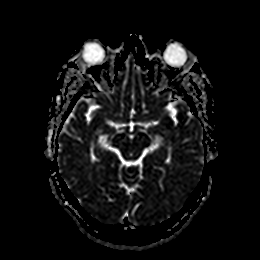
[im 27/48]
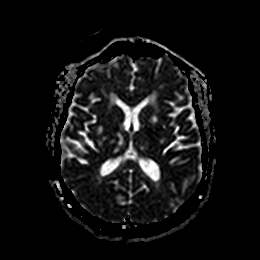
[im 32/48]
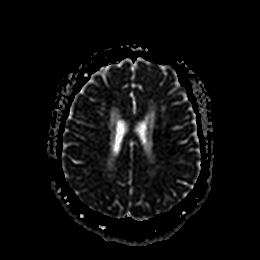
[im 37/48]
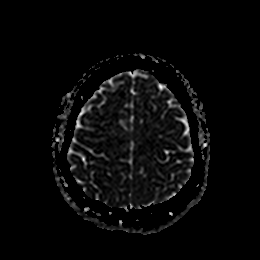
[im 42/48]
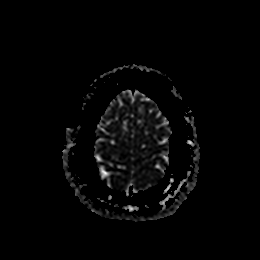
[im 48/48]
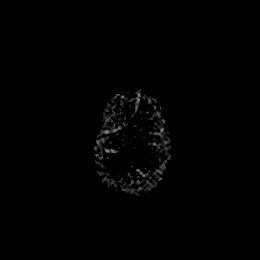

[Series 7: DWI · coronal · 4.0mm · 0.88mm/px · 13 of 64 slices shown (3 of 4)]
[im 1/64]
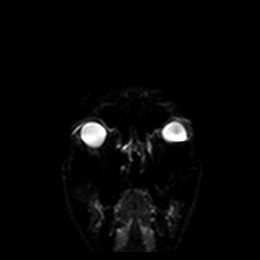
[im 6/64]
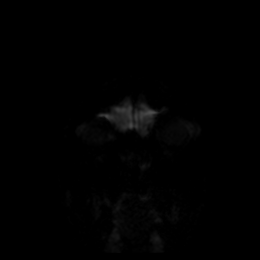
[im 11/64]
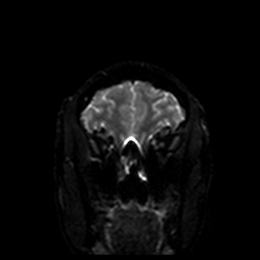
[im 16/64]
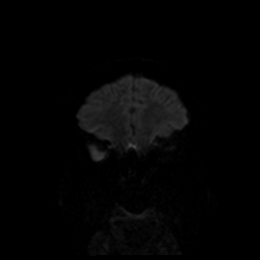
[im 22/64]
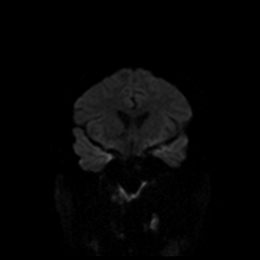
[im 27/64]
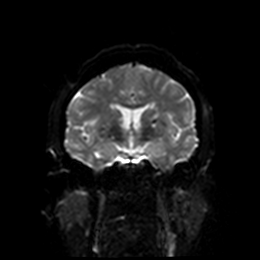
[im 32/64]
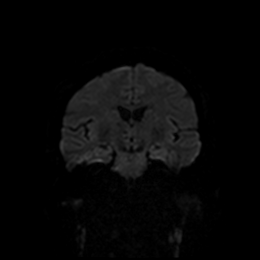
[im 37/64]
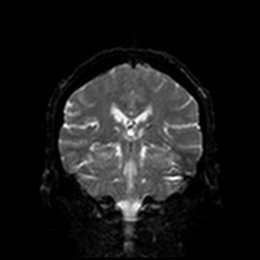
[im 43/64]
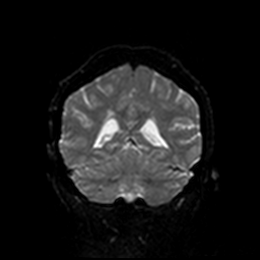
[im 48/64]
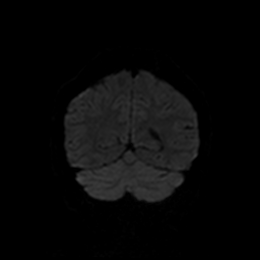
[im 53/64]
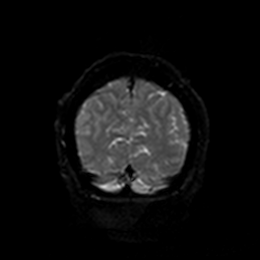
[im 58/64]
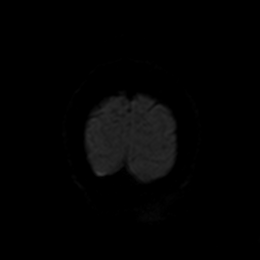
[im 64/64]
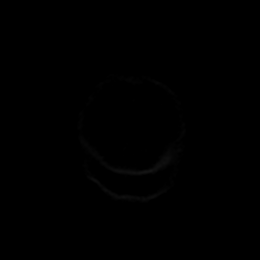

[Series 8: DWI · coronal · 4.0mm · 0.88mm/px · 6 of 32 slices shown (4 of 4)]
[im 1/32]
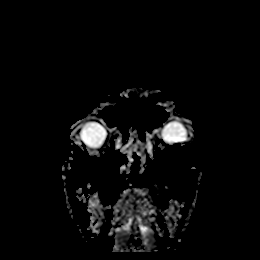
[im 7/32]
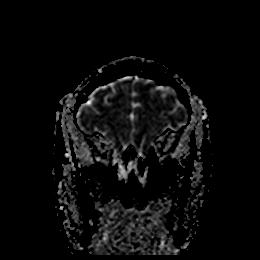
[im 13/32]
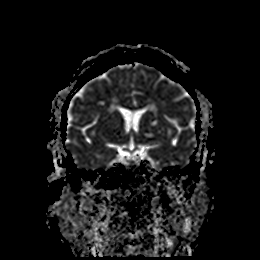
[im 19/32]
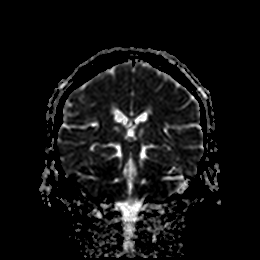
[im 25/32]
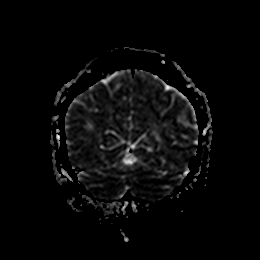
[im 32/32]
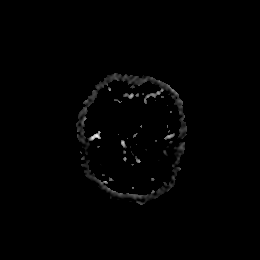

[48 of 48 positions shown; findings below may reference images not displayed]

FINDINGS: Brain: Examination is technically limited as the patient was unable
to tolerate the exam. Axial and coronal DWI sequences only were
performed.

1 cm focus of mild diffusion abnormality at the posterior limb of
the right internal capsule. Associated ADC signal has likely
resolved at this time. Given patient's symptoms, this most likely
reflects a subacute ischemic infarct (series 5, image 75). No
definite surrounding edema or mass effect. No secondary signs to
suggest associated hemorrhage. No other evidence for acute or
subacute ischemia.

No other definite acute abnormality seen on this motion degraded
exam. No visible encephalomalacia to suggest chronic cortical
infarction elsewhere. Suspected scattered chronic micro hemorrhages
clustered about the cerebellum, brainstem, and deep gray nuclei,
suggesting chronic poorly controlled hypertension, incompletely
assessed on this limited exam. No visible mass lesion or mass
effect. No midline shift or hydrocephalus. No extra-axial fluid
collection.

Vascular: Not assessed on this exam.

Skull and upper cervical spine: Not assessed on this exam.

Sinuses/Orbits: Not assessed on this exam.

Other: None.
IMPRESSION: 1. Technically limited exam due to the patient's inability to
tolerate the study. DWI sequences only were performed.
2. 1 cm focus of mild diffusion abnormality at the posterior limb of
the right internal capsule, consistent with a small subacute
ischemic infarct.
3. No other definite acute intracranial abnormality seen on this
limited exam.
4. Suspected scattered chronic micro hemorrhages clustered about the
cerebellum, brainstem, and deep gray nuclei, suggesting chronic
poorly controlled hypertension, incompletely assessed on this exam.

## 2020-04-08 MED ORDER — CLOPIDOGREL BISULFATE 75 MG PO TABS: 75.0000 mg | ORAL_TABLET | Freq: Every day | ORAL | 2 refills | Status: DC

## 2020-04-08 MED ORDER — AMLODIPINE BESYLATE 10 MG PO TABS: 10.0000 mg | ORAL_TABLET | Freq: Every day | ORAL | 2 refills | Status: DC

## 2020-04-08 MED ORDER — AMLODIPINE BESYLATE 10 MG PO TABS
10.0000 mg | ORAL_TABLET | Freq: Every day | ORAL | Status: DC
  Administered 2020-04-09 – 2020-04-13 (×5): 10 mg via ORAL
  Filled 2020-04-08 (×5): qty 1

## 2020-04-08 MED ORDER — ACETAMINOPHEN 325 MG PO TABS
650.0000 mg | ORAL_TABLET | ORAL | Status: DC | PRN
  Administered 2020-04-08: 650 mg via ORAL
  Filled 2020-04-08 (×2): qty 2

## 2020-04-08 MED ORDER — ATORVASTATIN CALCIUM 80 MG PO TABS: 80.0000 mg | ORAL_TABLET | Freq: Every day | ORAL | 2 refills | Status: DC

## 2020-04-08 MED ORDER — THIAMINE HCL 100 MG/ML IJ SOLN
100.0000 mg | Freq: Every day | INTRAMUSCULAR | Status: DC
  Filled 2020-04-08 (×5): qty 2

## 2020-04-08 MED ORDER — ATORVASTATIN CALCIUM 80 MG PO TABS
80.0000 mg | ORAL_TABLET | Freq: Every day | ORAL | Status: DC
  Administered 2020-04-09 – 2020-04-13 (×5): 80 mg via ORAL
  Filled 2020-04-08 (×5): qty 1

## 2020-04-08 MED ORDER — ACETAMINOPHEN 650 MG RE SUPP: 650.0000 mg | RECTAL | Status: DC | PRN

## 2020-04-08 MED ORDER — SORBITOL 70 % SOLN: 30.0000 mL | Freq: Every day | Status: DC | PRN

## 2020-04-08 MED ORDER — ENOXAPARIN SODIUM 40 MG/0.4ML ~~LOC~~ SOLN
40.0000 mg | SUBCUTANEOUS | Status: DC
  Administered 2020-04-08: 40 mg via SUBCUTANEOUS
  Filled 2020-04-08 (×3): qty 0.4

## 2020-04-08 MED ORDER — CLOPIDOGREL BISULFATE 75 MG PO TABS
75.0000 mg | ORAL_TABLET | Freq: Every day | ORAL | Status: DC
  Administered 2020-04-09 – 2020-04-13 (×5): 75 mg via ORAL
  Filled 2020-04-08 (×5): qty 1

## 2020-04-08 MED ORDER — NICOTINE 7 MG/24HR TD PT24
7.0000 mg | MEDICATED_PATCH | Freq: Every day | TRANSDERMAL | Status: DC
  Administered 2020-04-09 – 2020-04-13 (×5): 7 mg via TRANSDERMAL
  Filled 2020-04-08 (×5): qty 1

## 2020-04-08 MED ORDER — ACETAMINOPHEN 160 MG/5ML PO SOLN: 650.0000 mg | ORAL | Status: DC | PRN

## 2020-04-08 MED ORDER — ENOXAPARIN SODIUM 40 MG/0.4ML ~~LOC~~ SOLN: 40.0000 mg | SUBCUTANEOUS | Status: DC

## 2020-04-08 MED ORDER — THIAMINE HCL 100 MG PO TABS
100.0000 mg | ORAL_TABLET | Freq: Every day | ORAL | Status: DC
  Administered 2020-04-09 – 2020-04-13 (×5): 100 mg via ORAL
  Filled 2020-04-08 (×5): qty 1

## 2020-04-08 MED ORDER — SENNOSIDES-DOCUSATE SODIUM 8.6-50 MG PO TABS: 1.0000 | ORAL_TABLET | Freq: Every evening | ORAL | Status: DC | PRN

## 2020-04-08 NOTE — Progress Notes (Signed)
Inpatient Rehabilitation Medication Review by a Pharmacist  A complete drug regimen review was completed for this patient to identify any potential clinically significant medication issues.  Clinically significant medication issues were identified:  no  Check AMION for pharmacist assigned to patient if future medication questions/issues arise during this admission.  Pharmacist comments:   Time spent performing this drug regimen review (minutes):  [place], PharmD 04/08/2020 5:47 PM

## 2020-04-08 NOTE — Progress Notes (Signed)
Patient is refusing 81 mg ASA this morning stating that "it makes her itch".  I mentioned that she took the 81 mg ASA yesterday, and she stated 'I was itching like crazy".  I notified by page, both IM teaching service and [name], NP (neurology).  Upon further questioning, patient indicated that she was not itching now, denies a rash, and only experiences itching for 20 min, however, pt still refuses to take aspirin.  [name], NP stated that she planned to round on the patient and provide patient education. Instruction were provided by [name], NP that I should document the refusal.

## 2020-04-08 NOTE — H&P (Signed)
Physical Medicine and Rehabilitation Admission H&P    Chief Complaint  Patient presents with  . Stroke Symptoms  : HPI: Roberta Calderon is a 49 year old right-handed female with history of hypertension and tobacco/alcohol abuse who presented 04/06/2020 with left-sided weakness x1 week.  History taken from chart review and patient.  Patient lives alone works at [employer] independent prior to admission.  She has a mother and daughter in the area.  Cranial CT scan showed scattered areas of decreased attenuation on the right consistent with a subacute ischemia.  Patient did not receive TPA.  CTA of head and neck no intracranial large vessel occlusion or proximal high-grade stenosis.  MRI completed technically limited exam due to patient's inability to tolerate the study showed a 1 cm focus of mild diffusion abnormality of the posterior limb of the right internal capsule consistent with a small subacute infarction.  Suspected scattered chronic microhemorrhages clustered about the cerebellum, brainstem and deep gray nuclei.  Echocardiogram with ejection fraction of 70%.  Grade 1 diastolic dysfunction.  Admission chemistries urine drug screen positive cocaine and benzos, glucose 108, hemoglobin A1c 6.3, hemoglobin 15.2, TSH 0.887, SARS coronavirus negative.  Currently maintained on aspirin and Plavix for CVA prophylaxis x3 weeks days and then aspirin alone.  Subcutaneous Lovenox for DVT prophylaxis.  Tolerating a regular diet.  Therapy evaluations completed and patient was admitted for a comprehensive rehab program.  Please see preadmission assessment as well.  Review of Systems  Constitutional: Negative for chills and fever.  HENT: Negative for hearing loss.   Eyes: Negative for blurred vision and double vision.  Respiratory: Negative for cough and shortness of breath.   Cardiovascular: Negative for chest pain, palpitations and leg swelling.  Gastrointestinal: Positive for constipation.  Negative for heartburn, nausea and vomiting.  Genitourinary: Negative for flank pain and hematuria.  Musculoskeletal: Positive for myalgias.  Skin: Negative for rash.  Neurological: Positive for dizziness, sensory change, speech change, focal weakness, weakness and headaches.  All other systems reviewed and are negative.  Past Medical History:  Diagnosis Date  . Hypertension    No past surgical history. No pertinent family history of premature CVA. Social History:  reports that she has been smoking cigarettes. She has been smoking about 0.25 packs per day. She has never used smokeless tobacco. She reports current alcohol use. She reports that she does not use drugs. Allergies:  Allergies  Allergen Reactions  . Nsaids Nausea And Vomiting and Rash   Medications Prior to Admission  Medication Sig Dispense Refill  . hydrochlorothiazide (HYDRODIURIL) 25 MG tablet Take 1 tablet (25 mg total) by mouth daily. (Patient not taking: Reported on 04/05/2020) 30 tablet 0  . HYDROcodone-acetaminophen (NORCO/VICODIN) 5-325 MG tablet Take 2 tablets by mouth every 4 (four) hours as needed. (Patient not taking: Reported on 04/05/2020) 16 tablet 0  . methocarbamol (ROBAXIN) 500 MG tablet Take 1 tablet (500 mg total) by mouth 2 (two) times daily. (Patient not taking: Reported on 04/05/2020) 20 tablet 0    Drug Regimen Review Drug regimen was reviewed and remains appropriate with no significant issues identified  Home: Home Living Family/patient expects to be discharged to:: Shelter/Homeless Type of Home: Homeless   Functional History: Prior Function Level of Independence: Independent  Functional Status:  Mobility: Bed Mobility Overal bed mobility: Needs Assistance Bed Mobility: Supine to Sit, Sit to Supine Supine to sit: HOB elevated, Min guard Sit to supine: Min guard General bed mobility comments: increased time, min guard  for safety Transfers Overall transfer level: Needs  assistance Equipment used: 2 person hand held assist Transfers: Sit to/from Stand Sit to Stand: +2 safety/equipment, Min assist General transfer comment: assist to power up and stabilize balance Ambulation/Gait Ambulation/Gait assistance: Mod assist, +2 safety/equipment Gait Distance (Feet): 130 Feet Assistive device: 2 person hand held assist Gait Pattern/deviations: Step-through pattern, Drifts right/left General Gait Details: multiple episodes of LOB requiring mod assist to maintain balance, increasing in frequency with fatigue Gait velocity: decreased Gait velocity interpretation: 1.31 - 2.62 ft/sec, indicative of limited community ambulator    ADL: ADL Overall ADL's : Needs assistance/impaired Grooming: Min guard, Standing Grooming Details (indicate cue type and reason): increased time to manage items with L hand, min guard standing  Upper Body Bathing: Set up, Sitting Lower Body Bathing: Sit to/from stand, Minimal assistance Upper Body Dressing : Minimal assistance, Sitting Lower Body Dressing: Minimal assistance, Sit to/from stand Toilet Transfer: Minimal assistance, Ambulation Toilet Transfer Details (indicate cue type and reason): simulated in room Functional mobility during ADLs: Minimal assistance, Moderate assistance, Cueing for safety General ADL Comments: pt limited by L sided weakness, impaired balance and decreased safety awareness   Cognition: Cognition Overall Cognitive Status: No family/caregiver present to determine baseline cognitive functioning Arousal/Alertness: Awake/alert Orientation Level: Oriented X4 Attention: Focused, Sustained, Selective Focused Attention: Appears intact Sustained Attention: Impaired Sustained Attention Impairment: Verbal basic Selective Attention: Impaired Selective Attention Impairment: Verbal basic, Functional basic Memory: Impaired (recalled 2/5 words, did not recognize the other 3 given multiple choice) Behaviors: Poor  frustration tolerance Comments: 17/30 SLUMS Cognition Arousal/Alertness: Awake/alert Behavior During Therapy: Flat affect Overall Cognitive Status: No family/caregiver present to determine baseline cognitive functioning General Comments: Subdued, most likely due to receiving Ativan overnight. Following simple commands consistently. Decreased safety awareness. Further cognitive assessment required, pt with minimal verbalizations and decreased engagement with therapist (attempted cog screen, but pt declined to answer therapist)  Physical Exam: Blood pressure (!) 164/103, pulse 79, temperature 98 F (36.7 C), temperature source Oral, resp. rate 17, height 5\' 10"  (1.778 m), weight 59.1 kg, SpO2 100 %. Physical Exam Vitals reviewed.  Constitutional:      General: She is not in acute distress.    Appearance: Normal appearance.  HENT:     Head: Normocephalic and atraumatic.     Right Ear: External ear normal.     Left Ear: External ear normal.     Nose: Nose normal.  Eyes:     General:        Right eye: No discharge.        Left eye: No discharge.     Extraocular Movements: Extraocular movements intact.  Cardiovascular:     Rate and Rhythm: Normal rate and regular rhythm.  Pulmonary:     Effort: Pulmonary effort is normal. No respiratory distress.     Breath sounds: Normal breath sounds. No stridor.  Abdominal:     General: Abdomen is flat. Bowel sounds are normal. There is no distension.  Musculoskeletal:     Cervical back: Normal range of motion and neck supple.     Comments: No edema or tenderness in extremities  Skin:    General: Skin is warm and dry.  Neurological:     Mental Status: She is alert.     Comments: Alert  Provides her name age and place.   Follows simple commands. Motor: LUE: 3+/5 proximal distal LLE: 3+/5 proximal distal Dysarthria Left-sided numbness  Psychiatric:        Mood and  Affect: Affect is blunt.        Speech: Speech is delayed.     Results  for orders placed or performed during the hospital encounter of 04/05/20 (from the past 48 hour(s))  SARS Coronavirus 2 by RT PCR (hospital order, performed in [facility] hospital lab) Nasopharyngeal Nasopharyngeal Swab     Status: None   Collection Time: 04/06/20  9:15 AM   Specimen: Nasopharyngeal Swab  Result Value Ref Range   SARS Coronavirus 2 NEGATIVE NEGATIVE    Comment: (NOTE) SARS-CoV-2 target nucleic acids are NOT DETECTED.  The SARS-CoV-2 RNA is generally detectable in upper and lower respiratory specimens during the acute phase of infection. The lowest concentration of SARS-CoV-2 viral copies this assay can detect is 250 copies / mL. A negative result does not preclude SARS-CoV-2 infection and should not be used as the sole basis for treatment or other patient management decisions.  A negative result may occur with improper specimen collection / handling, submission of specimen other than nasopharyngeal swab, presence of viral mutation(s) within the areas targeted by this assay, and inadequate number of viral copies (<250 copies / mL). A negative result must be combined with clinical observations, patient history, and epidemiological information.  Fact Sheet for Patients:   StrictlyIdeas.no  Fact Sheet for Healthcare Providers: BankingDealers.co.za  This test is not yet approved or  cleared by the [place] FDA and has been authorized for detection and/or diagnosis of SARS-CoV-2 by FDA under an Emergency Use Authorization (EUA).  This EUA will remain in effect (meaning this test can be used) for the duration of the COVID-19 declaration under Section 564(b)(1) of the Act, 21 U.S.C. section 360bbb-3(b)(1), unless the authorization is terminated or revoked sooner.  Performed at [facility], [place] [address], [place], [place] [address]   TSH     Status: None   Collection Time: 04/06/20 11:05 AM  Result Value Ref  Range   TSH 0.887 0.350 - 4.500 uIU/mL    Comment: Performed by a 3rd Generation assay with a functional sensitivity of <=0.01 uIU/mL. Performed at [facility], [place] [address], [place], [place] [address]   Sedimentation rate     Status: None   Collection Time: 04/06/20 11:05 AM  Result Value Ref Range   Sed Rate 5 0 - 22 mm/hr    Comment: Performed at [facility] [address], [place], [place] [address]  RPR     Status: None   Collection Time: 04/06/20 11:05 AM  Result Value Ref Range   RPR Ser Ql NON REACTIVE NON REACTIVE    Comment: Performed at [facility], [place] [address] [place], [place] [address]  HIV Antibody (routine testing w rflx)     Status: None   Collection Time: 04/06/20 11:05 AM  Result Value Ref Range   HIV Screen 4th Generation wRfx Non Reactive Non Reactive    Comment: Performed at [facility], [place] [address], [place], [place] [address]  CBC with Differential/Platelet     Status: Abnormal   Collection Time: 04/06/20 11:05 AM  Result Value Ref Range   WBC 4.6 4.0 - 10.5 K/uL   RBC 5.35 (H) 3.87 - 5.11 MIL/uL   Hemoglobin 13.6 12.0 - 15.0 g/dL   HCT 44.8 36 - 46 %   MCV 83.7 80.0 - 100.0 fL   MCH 25.4 (L) 26.0 - 34.0 pg   MCHC 30.4 30.0 - 36.0 g/dL  RDW 14.3 11.5 - 15.5 %   Platelets 299 150 - 400 K/uL   nRBC 0.0 0.0 - 0.2 %   Neutrophils Relative % 45 %   Neutro Abs 2.1 1.7 - 7.7 K/uL   Lymphocytes Relative 43 %   Lymphs Abs 2.0 0.7 - 4.0 K/uL   Monocytes Relative 7 %   Monocytes Absolute 0.3 0 - 1 K/uL   Eosinophils Relative 4 %   Eosinophils Absolute 0.2 0 - 0 K/uL   Basophils Relative 1 %   Basophils Absolute 0.0 0 - 0 K/uL   Immature Granulocytes 0 %   Abs Immature Granulocytes 0.01 0.00 - 0.07 K/uL    Comment: Performed at [facility] [address], [place], [place] [address]  Erythropoietin     Status: None   Collection Time: 04/06/20 11:12 AM  Result Value Ref Range   Erythropoietin 5.5 2.6 - 18.5 mIU/mL     Comment: (NOTE) [employer] UniCel DxI 800 Immunoassay System Values obtained with different assay methods or kits cannot be used interchangeably. Results cannot be interpreted as absolute evidence of the presence or absence of malignant disease. Performed At: [facility] [place], [place] [id number] Rush Farmer MD [id number]   Urinalysis, Routine w reflex microscopic     Status: Abnormal   Collection Time: 04/07/20  8:25 AM  Result Value Ref Range   Color, Urine YELLOW YELLOW   APPearance HAZY (A) CLEAR   Specific Gravity, Urine 1.014 1.005 - 1.030   pH 6.0 5.0 - 8.0   Glucose, UA NEGATIVE NEGATIVE mg/dL   Hgb urine dipstick NEGATIVE NEGATIVE   Bilirubin Urine NEGATIVE NEGATIVE   Ketones, ur NEGATIVE NEGATIVE mg/dL   Protein, ur NEGATIVE NEGATIVE mg/dL   Nitrite NEGATIVE NEGATIVE   Leukocytes,Ua NEGATIVE NEGATIVE    Comment: Performed at [facility] [address] [place], [place] [address]  Urine rapid drug screen (hosp performed)     Status: Abnormal   Collection Time: 04/07/20  8:26 AM  Result Value Ref Range   Opiates NONE DETECTED NONE DETECTED   Cocaine POSITIVE (A) NONE DETECTED   Benzodiazepines POSITIVE (A) NONE DETECTED   Amphetamines NONE DETECTED NONE DETECTED   Tetrahydrocannabinol NONE DETECTED NONE DETECTED   Barbiturates NONE DETECTED NONE DETECTED    Comment: (NOTE) DRUG SCREEN FOR MEDICAL PURPOSES ONLY.  IF CONFIRMATION IS NEEDED FOR ANY PURPOSE, NOTIFY LAB WITHIN 5 DAYS.  LOWEST DETECTABLE LIMITS FOR URINE DRUG SCREEN Drug Class                     Cutoff (ng/mL) Amphetamine and metabolites    1000 Barbiturate and metabolites    200 Benzodiazepine                 315 Tricyclics and metabolites     300 Opiates and metabolites        300 Cocaine and metabolites        300 THC                            50 Performed at [facility], [place] [address] [place], [place] [address]    CT Code Stroke CTA  Head W/WO contrast  Result Date: 04/06/2020 CLINICAL DATA:  Neuro deficit, acute, stroke suspected. Additional history provided: Patient presents with weakness to left side for 1 week, worsening, left arm with weak grip strength,  drift to the left. EXAM: CT ANGIOGRAPHY HEAD AND NECK TECHNIQUE: Multidetector CT imaging of the head and neck was performed using the standard protocol during bolus administration of intravenous contrast. Multiplanar CT image reconstructions and MIPs were obtained to evaluate the vascular anatomy. Carotid stenosis measurements (when applicable) are obtained utilizing NASCET criteria, using the distal internal carotid diameter as the denominator. CONTRAST:  51mL OMNIPAQUE IOHEXOL 350 MG/ML SOLN COMPARISON:  Head CT 04/05/2020 FINDINGS: CT HEAD FINDINGS Brain: Redemonstrated ischemic infarction changes within the right thalamus and thalamocapsular junction, possibly subacute. Redemonstrated age-indeterminate lacunar infarcts within the basal ganglia bilaterally. Unchanged chronic lacunar infarct within the left thalamus and paramedian posterior right pons. Mild background ill-defined hypoattenuation within cerebral white matter is advanced for age and nonspecific, but consistent with chronic small vessel ischemic disease. There is no acute intracranial hemorrhage. No demarcated cortical infarct is identified. No extra-axial fluid collection. No evidence of intracranial mass. No midline shift. Vascular: Reported below. Skull: Normal. Negative for fracture or focal lesion. Sinuses: No significant paranasal sinus disease or mastoid effusion Orbits: No acute orbital abnormality identified. Review of the MIP images confirms the above findings CTA NECK FINDINGS Aortic arch: Standard aortic branching. Minimal calcified plaque within the visualized aortic arch and proximal major branch vessels of the neck. No hemodynamically significant innominate or proximal subclavian artery stenosis. Right  carotid system: CCA and ICA patent within the neck without significant stenosis (50% or greater). Mild calcified plaque at the carotid bifurcation. Left carotid system: CCA and ICA patent within the neck without significant stenosis (50% or greater). Mild calcified plaque at the carotid bifurcation. Vertebral arteries: Codominant and patent within the neck without significant stenosis. Skeleton: No acute bony abnormality or aggressive osseous lesion. Advanced for age cervical spondylosis with multilevel disc space narrowing, posterior disc osteophytes, uncovertebral and facet hypertrophy. Other neck: No neck mass or cervical lymphadenopathy Upper chest: No consolidation within the imaged lung apices. Review of the MIP images confirms the above findings CTA HEAD FINDINGS Anterior circulation: The intracranial internal carotid arteries are patent. Calcified plaque within both vessels with no more than mild stenosis. The M1 middle cerebral arteries are patent without significant stenosis. No M2 proximal branch occlusion or high-grade proximal stenosis is identified. The anterior cerebral arteries are patent. No intracranial aneurysm is identified. Posterior circulation: The intracranial vertebral arteries are patent. The basilar artery is patent. The posterior cerebral arteries are patent. Posterior communicating arteries are hypoplastic or absent bilaterally. Venous sinuses: Within limitations of contrast timing, no convincing thrombus. Anatomic variants: As described Review of the MIP images confirms the above findings IMPRESSION: CT head: 1. Redemonstrated ischemic infarction changes within the right thalamus and thalamocapsular junction, possibly subacute. Consider brain MRI for further evaluation. 2. Redemonstrated age-indeterminate lacunar infarcts within the basal ganglia bilaterally. 3. 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 4. Mild background cerebral white matter chronic small vessel  ischemic disease, which is advanced for age. CTA neck: 1. The common carotid, internal carotid and vertebral arteries are patent within the neck without hemodynamically significant stenosis. Mild calcified plaque within the visualized aortic arch, proximal major branch vessels of the neck and carotid bifurcations. CTA head: 1. No intracranial large vessel occlusion or proximal high-grade arterial stenosis. 2. Mild calcified plaque within both intracranial internal carotid arteries with no more than mild stenosis. Electronically Signed   By: Kellie Simmering DO   On: 04/06/2020 13:18   CT Code Stroke CTA Neck W/WO contrast  Result Date: 04/06/2020 CLINICAL DATA:  Neuro deficit, acute, stroke suspected. Additional history provided: Patient presents with weakness to left side for 1 week, worsening, left arm with weak grip strength, drift to the left. EXAM: CT ANGIOGRAPHY HEAD AND NECK TECHNIQUE: Multidetector CT imaging of the head and neck was performed using the standard protocol during bolus administration of intravenous contrast. Multiplanar CT image reconstructions and MIPs were obtained to evaluate the vascular anatomy. Carotid stenosis measurements (when applicable) are obtained utilizing NASCET criteria, using the distal internal carotid diameter as the denominator. CONTRAST:  81mL OMNIPAQUE IOHEXOL 350 MG/ML SOLN COMPARISON:  Head CT 04/05/2020 FINDINGS: CT HEAD FINDINGS Brain: Redemonstrated ischemic infarction changes within the right thalamus and thalamocapsular junction, possibly subacute. Redemonstrated age-indeterminate lacunar infarcts within the basal ganglia bilaterally. Unchanged chronic lacunar infarct within the left thalamus and paramedian posterior right pons. Mild background ill-defined hypoattenuation within cerebral white matter is advanced for age and nonspecific, but consistent with chronic small vessel ischemic disease. There is no acute intracranial hemorrhage. No demarcated cortical  infarct is identified. No extra-axial fluid collection. No evidence of intracranial mass. No midline shift. Vascular: Reported below. Skull: Normal. Negative for fracture or focal lesion. Sinuses: No significant paranasal sinus disease or mastoid effusion Orbits: No acute orbital abnormality identified. Review of the MIP images confirms the above findings CTA NECK FINDINGS Aortic arch: Standard aortic branching. Minimal calcified plaque within the visualized aortic arch and proximal major branch vessels of the neck. No hemodynamically significant innominate or proximal subclavian artery stenosis. Right carotid system: CCA and ICA patent within the neck without significant stenosis (50% or greater). Mild calcified plaque at the carotid bifurcation. Left carotid system: CCA and ICA patent within the neck without significant stenosis (50% or greater). Mild calcified plaque at the carotid bifurcation. Vertebral arteries: Codominant and patent within the neck without significant stenosis. Skeleton: No acute bony abnormality or aggressive osseous lesion. Advanced for age cervical spondylosis with multilevel disc space narrowing, posterior disc osteophytes, uncovertebral and facet hypertrophy. Other neck: No neck mass or cervical lymphadenopathy Upper chest: No consolidation within the imaged lung apices. Review of the MIP images confirms the above findings CTA HEAD FINDINGS Anterior circulation: The intracranial internal carotid arteries are patent. Calcified plaque within both vessels with no more than mild stenosis. The M1 middle cerebral arteries are patent without significant stenosis. No M2 proximal branch occlusion or high-grade proximal stenosis is identified. The anterior cerebral arteries are patent. No intracranial aneurysm is identified. Posterior circulation: The intracranial vertebral arteries are patent. The basilar artery is patent. The posterior cerebral arteries are patent. Posterior communicating  arteries are hypoplastic or absent bilaterally. Venous sinuses: Within limitations of contrast timing, no convincing thrombus. Anatomic variants: As described Review of the MIP images confirms the above findings IMPRESSION: CT head: 1. Redemonstrated ischemic infarction changes within the right thalamus and thalamocapsular junction, possibly subacute. Consider brain MRI for further evaluation. 2. Redemonstrated age-indeterminate lacunar infarcts within the basal ganglia bilaterally. 3. 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 4. Mild background cerebral white matter chronic small vessel ischemic disease, which is advanced for age. CTA neck: 1. The common carotid, internal carotid and vertebral arteries are patent within the neck without hemodynamically significant stenosis. Mild calcified plaque within the visualized aortic arch, proximal major branch vessels of the neck and carotid bifurcations. CTA head: 1. No intracranial large vessel occlusion or proximal high-grade arterial stenosis. 2. Mild calcified plaque within both intracranial internal carotid arteries with no more than mild stenosis. Electronically Signed  By: Kellie Simmering DO   On: 04/06/2020 13:18   MR BRAIN WO CONTRAST  Result Date: 04/08/2020 CLINICAL DATA:  Initial evaluation for acute left-sided weakness for 1 week. EXAM: MRI HEAD WITHOUT CONTRAST TECHNIQUE: Multiplanar, multiecho pulse sequences of the brain and surrounding structures were obtained without intravenous contrast. COMPARISON:  Prior CTs from 04/05/2020 and 04/06/2020. FINDINGS: Brain: Examination is technically limited as the patient was unable to tolerate the exam. Axial and coronal DWI sequences only were performed. 1 cm focus of mild diffusion abnormality at the posterior limb of the right internal capsule. Associated ADC signal has likely resolved at this time. Given patient's symptoms, this most likely reflects a subacute ischemic infarct (series 5,  image 72). No definite surrounding edema or mass effect. No secondary signs to suggest associated hemorrhage. No other evidence for acute or subacute ischemia. No other definite acute abnormality seen on this motion degraded exam. No visible encephalomalacia to suggest chronic cortical infarction elsewhere. Suspected scattered chronic micro hemorrhages clustered about the cerebellum, brainstem, and deep gray nuclei, suggesting chronic poorly controlled hypertension, incompletely assessed on this limited exam. No visible mass lesion or mass effect. No midline shift or hydrocephalus. No extra-axial fluid collection. Vascular: Not assessed on this exam. Skull and upper cervical spine: Not assessed on this exam. Sinuses/Orbits: Not assessed on this exam. Other: None. IMPRESSION: 1. Technically limited exam due to the patient's inability to tolerate the study. DWI sequences only were performed. 2. 1 cm focus of mild diffusion abnormality at the posterior limb of the right internal capsule, consistent with a small subacute ischemic infarct. 3. No other definite acute intracranial abnormality seen on this limited exam. 4. Suspected scattered chronic micro hemorrhages clustered about the cerebellum, brainstem, and deep gray nuclei, suggesting chronic poorly controlled hypertension, incompletely assessed on this exam. Electronically Signed   By: Jeannine Boga M.D.   On: 04/08/2020 03:27   ECHOCARDIOGRAM COMPLETE BUBBLE STUDY  Result Date: 04/06/2020    ECHOCARDIOGRAM REPORT   Patient Name:   MARTIN SMEAL Date of Exam: 04/06/2020 Medical Rec #:  [id number]          Height:       70.0 in Accession #:    [id number]         Weight:       137.0 lb Date of Birth:  02-19-1971           BSA:          1.777 m Patient Age:    58 years           BP:           175/109 mmHg Patient Gender: F                  HR:           69 bpm. Exam Location:  Inpatient Procedure: 2D Echo, Cardiac Doppler, Color Doppler and Saline  Contrast Bubble            Study Indications:    Stroke  History:        Patient has no prior history of Echocardiogram examinations.                 Stroke; Risk Factors:Current Smoker and Hypertension.  Sonographer:    Roseanna Rainbow RDCS Referring Phys: [place]  1. Left ventricular ejection fraction, by estimation, is 65 to 70%. The left ventricle has hyperdynamic function. The left ventricle has no  regional wall motion abnormalities. There is moderate left ventricular hypertrophy. Left ventricular diastolic parameters are consistent with Grade I diastolic dysfunction (impaired relaxation). There was a left ventricular mid-cavity gradient peak 20 mmHg.  2. Right ventricular systolic function is normal. The right ventricular size is normal. Tricuspid regurgitation signal is inadequate for assessing PA pressure.  3. The aortic valve is tricuspid. Aortic valve regurgitation is not visualized. No aortic stenosis is present.  4. The mitral valve is normal in structure. No evidence of mitral valve regurgitation. No evidence of mitral stenosis.  5. The inferior vena cava is normal in size with greater than 50% respiratory variability, suggesting right atrial pressure of 3 mmHg. FINDINGS  Left Ventricle: Left ventricular ejection fraction, by estimation, is 65 to 70%. The left ventricle has hyperdynamic function. The left ventricle has no regional wall motion abnormalities. The left ventricular internal cavity size was normal in size. There is moderate left ventricular hypertrophy. Left ventricular diastolic parameters are consistent with Grade I diastolic dysfunction (impaired relaxation). Right Ventricle: The right ventricular size is normal. No increase in right ventricular wall thickness. Right ventricular systolic function is normal. Tricuspid regurgitation signal is inadequate for assessing PA pressure. Left Atrium: Left atrial size was normal in size. Right Atrium: Right atrial size was  normal in size. Pericardium: There is no evidence of pericardial effusion. Mitral Valve: The mitral valve is normal in structure. Mild mitral annular calcification. No evidence of mitral valve regurgitation. No evidence of mitral valve stenosis. Tricuspid Valve: The tricuspid valve is normal in structure. Tricuspid valve regurgitation is not demonstrated. Aortic Valve: The aortic valve is tricuspid. Aortic valve regurgitation is not visualized. No aortic stenosis is present. Pulmonic Valve: The pulmonic valve was normal in structure. Pulmonic valve regurgitation is not visualized. Aorta: The aortic root is normal in size and structure. Venous: The inferior vena cava is normal in size with greater than 50% respiratory variability, suggesting right atrial pressure of 3 mmHg. IAS/Shunts: No atrial level shunt detected by color flow Doppler. Agitated saline contrast was given intravenously to evaluate for intracardiac shunting.  LEFT VENTRICLE PLAX 2D LVIDd:         3.30 cm     Diastology LVIDs:         2.10 cm     LV e' lateral:   5.66 cm/s LV PW:         1.50 cm     LV E/e' lateral: 9.8 LV IVS:        1.50 cm     LV e' medial:    4.46 cm/s LVOT diam:     1.90 cm     LV E/e' medial:  12.4 LV SV:         63 LV SV Index:   35 LVOT Area:     2.84 cm  LV Volumes (MOD) LV vol d, MOD A2C: 70.7 ml LV vol d, MOD A4C: 57.7 ml LV vol s, MOD A2C: 14.8 ml LV vol s, MOD A4C: 12.1 ml LV SV MOD A2C:     55.9 ml LV SV MOD A4C:     57.7 ml LV SV MOD BP:      51.1 ml RIGHT VENTRICLE             IVC RV S prime:     13.30 cm/s  IVC diam: 1.80 cm TAPSE (M-mode): 2.5 cm LEFT ATRIUM             Index  RIGHT ATRIUM           Index LA diam:        2.90 cm 1.63 cm/m  RA Area:     10.10 cm LA Vol (A2C):   43.8 ml 24.64 ml/m RA Volume:   20.20 ml  11.37 ml/m LA Vol (A4C):   37.8 ml 21.27 ml/m LA Biplane Vol: 41.0 ml 23.07 ml/m  AORTIC VALVE LVOT Vmax:   112.00 cm/s LVOT Vmean:  74.500 cm/s LVOT VTI:    0.221 m  AORTA Ao Root diam:  2.70 cm Ao Asc diam:  3.20 cm MITRAL VALVE MV Area (PHT): 1.86 cm    SHUNTS MV Decel Time: 408 msec    Systemic VTI:  0.22 m MV E velocity: 55.20 cm/s  Systemic Diam: 1.90 cm MV A velocity: 79.20 cm/s MV E/A ratio:  0.70 Loralie Champagne MD Electronically signed by Loralie Champagne MD Signature Date/Time: 04/06/2020/7:27:36 PM    Final    Medical Problem List and Plan: 1.  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  Antithrombotics: -DVT/anticoagulation: Lovenox  -antiplatelet therapy: Aspirin 81 mg daily and Plavix 75 mg daily x21 days then aspirin alone 3. Pain Management: Tylenol as needed 4. Mood: Provide emotional support  -antipsychotic agents: N/A 5. Neuropsych: This patient is capable of making decisions on her own behalf. 6. Skin/Wound Care: Routine skin checks 7. Fluids/Electrolytes/Nutrition: Routine in and outs.  CMP ordered. 8.  Hypertension.  Norvasc 10 mg daily  Monitor the increased mobility. 9.  Hyperlipidemia.  Lipitor 10.  Tobacco/alcohol and polysubstance abuse.  Urine drug screen positive cocaine.  Counsel 11.  Prediabetes.  Hemoglobin A1c 6.3.  Follow-up outpatient.  Monitor increase mobility.  Lavon Paganini Angiulli, PA-C 04/08/2020  I have personally performed a face to face diagnostic evaluation, including, but not limited to relevant history and physical exam findings, of this patient and developed relevant assessment and plan.  Additionally, I have reviewed and concur with the physician assistant's documentation above.  Delice Lesch, MD, ABPMR

## 2020-04-08 NOTE — Discharge Summary (Signed)
Name: Roberta Calderon MRN: [id number] DOB: 22-Jun-1971 49 y.o. PCP: Patient, No Pcp Per  Date of Admission: 04/08/2020  3:09 PM Date of Discharge:  Attending Physician: Jamse Arn, MD  Discharge Diagnosis: 1. Ischemic stroke (Right thalamic infarction)  Discharge Medications: Tylenol 650 mg Q4h prn Plavix 75 mg daily Lipitor 80 mg daily Amlodipine 10 mg daily Nicotine patch 7 mg daily  Disposition and follow-up:   Roberta Calderon was discharged from [facility] in Stable condition.  At the hospital follow up visit please address:  1. Left sided weakness 3. Blood pressure 2. Substance use 3. Smoking   2.  Labs / imaging needed at time of follow-up: none  3.  Pending labs/ test needing follow-up: None  Follow-up Appointments:   Hospital Course by problem list:  Acute CVA involving R thalamus, R basal ganglia and R deep white matter Presented with 1 wk of left-sided weakness and paresthesias. CT head shows scattered areas of decreased attenuation on the right involving the R thalamus, R basal ganglia and R deep white matter in the parietal lobe consistent with acute to subacute ischemia. CTA neck and head show mild calcified plaque within both intracranial internal carotid arteries with no more than mild stenosis. ECHO shows EF of 65-70% with grade I diastolic dysfunction. MRI brain consistent with a small subacute, but limited exam ischemic infarct. The most likely etiology for her stroke is uncontrolled hypertension and continued tobacco and alcohol use. Neurology evaluated patient and recommended ASA 81 mg and Plavix 75 mg for 3 weeks, then ASA alone. Patient refused ASA due to itching so it was discontinued. We continue Lipitor 80 mg daily. Pertinent labs include: LDL 173, A1c 6.3, normal HIV and RPR. Patient was very resistant to interacting with nursing and medical team but agreed to go to inpatient rehab. Will require a set up with a PCP and  social resources available in the community.   Polycythemia, resolved Presents with thrombotic events and found to have elevated hemoglobin of 15.2 with repeat of 17.3, improved to 13.6 on 04/06/20. Work up showed normal erythropoietin level and JAK2 mutation level.   Hypertension-uncontrolled Takes hydrochlorothiazide 25 mg daily though unsure about compliance. BP elevated to allow for brain perfusion. Continue to allow for permissive hypertension.   Polysubstance use Patient with history of substance use including extensive history of alcohol. UDS positive for cocaine and benzos. Hemodynamically stable and no signs of withdraw. Provide substance use counseling and resources.   Tobacco use disorder History of smoking 1 ppd for years and now down to 1/2 ppd. Interested in smoking cessation resources. Continue NicoDerm  Discharge Vitals:   BP (!) 156/98 (BP Location: Left Arm)   Pulse 91   Temp 98.4 F (36.9 C)   Resp 18   Ht 5\' 9"  (1.753 m)   Wt 57.2 kg   SpO2 93%   BMI 18.62 kg/m   Pertinent Labs, Studies, and Procedures:  CBC Latest Ref Rng & Units 04/08/2020 04/06/2020 04/05/2020  WBC 4.0 - 10.5 K/uL 5.7 4.6 -  Hemoglobin 12.0 - 15.0 g/dL 14.0 13.6 17.3(H)  Hematocrit 36 - 46 % 45.3 44.8 51.0(H)  Platelets 150 - 400 K/uL 316 299 -   BMP Latest Ref Rng & Units 04/08/2020 04/05/2020 04/05/2020  Glucose 70 - 99 mg/dL - 107(H) 108(H)  BUN 6 - 20 mg/dL - 11 10  Creatinine 0.44 - 1.00 mg/dL 0.86 0.80 0.86  Sodium 135 - 145 mmol/L - 139  137  Potassium 3.5 - 5.1 mmol/L - 3.7 3.8  Chloride 98 - 111 mmol/L - 104 103  CO2 22 - 32 mmol/L - - 20(L)  Calcium 8.9 - 10.3 mg/dL - - 9.6      Discharge Instructions:   Roberta Calderon, we are discharging you from our service to inpatient rehab. They will work with you to get you stronger and address your needs. Please continue taking your plavix after leaving the hospital as this will help prevent you from having another stroke.      Signed: Lacinda Axon, MD 04/08/2020, 8:23 PM   Pager: [phone] Internal Medicine Teaching Service

## 2020-04-08 NOTE — Progress Notes (Signed)
PMR Admission Coordinator Pre-Admission Assessment   Patient: Roberta Calderon is an 49 y.o., female MRN: [id number] DOB: 04-24-1971 Height: 5\' 10"  (177.8 cm) Weight: 59.1 kg                                                                                                                                                  Insurance Information HMO:     PPO:      PCP:      IPA:      80/20:      OTHER:  PRIMARY: Uninsured      Policy#:       Subscriber:  CM Name:       Phone#:      Fax#:  Pre-Cert#:       Employer:  Benefits:  Phone #:      Name:  Eff. Date:      Deduct:       Out of Pocket Max:       Life Max:   CIR:       SNF:  Outpatient:      Co-Pay:  Home Health:       Co-Pay:  DME:      Co-Pay:  Providers:  SECONDARY:       Policy#:       Phone#:    Development worker, community:       Phone#:    The Therapist, art Information Summary" for patients in Inpatient Rehabilitation Facilities with attached "Privacy Act [place] Records" was provided and verbally reviewed with: N/A   Emergency Contact Information         Contact Information     Name Relation Home Work Mobile    [place] Mother [place] Daughter     [phone]       Current Medical History  Patient Admitting Diagnosis: CVA   History of Present Illness: Roberta Calderon is a 49 year old right-handed female with history of hypertension and tobacco/alcohol abuse who presented 04/06/2020 with left-sided weakness x1 week.  Cranial CT scan showed scattered areas of decreased attenuation on the right consistent with a subacute ischemia.  Patient did not receive TPA.  CTA of head and neck no intracranial large vessel occlusion or proximal high-grade stenosis.  MRI completed technically limited exam due to patient's inability to tolerate the study showed a 1 cm focus of mild diffusion abnormality of the posterior limb of the right internal capsule consistent with a small subacute  infarction.  Suspected scattered chronic microhemorrhages clustered about the cerebellum, brainstem and deep gray nuclei.  Echocardiogram with ejection fraction of 70%.  Grade 1 diastolic dysfunction.  Admission chemistries urine drug screen positive cocaine and benzos, glucose 108, hemoglobin A1c 6.3, hemoglobin 15.2, TSH 0.887, SARS coronavirus negative.  Currently maintained on aspirin and Plavix for  CVA prophylaxis x3 weeks days and then aspirin alone.  Subcutaneous Lovenox for DVT prophylaxis.  Tolerating a regular diet.  Therapy evaluations completed and patient was recommended for a comprehensive rehab program.     Complete NIHSS TOTAL: 4     Past Medical History      Past Medical History:  Diagnosis Date  . Hypertension        Family History  family history is not on file.   Prior Rehab/Hospitalizations:  Has the patient had prior rehab or hospitalizations prior to admission? No   Has the patient had major surgery during 100 days prior to admission? No   Current Medications    Current Facility-Administered Medications:  .  acetaminophen (TYLENOL) tablet 650 mg, 650 mg, Oral, Q4H PRN, 650 mg at 04/06/20 2025 **OR** acetaminophen (TYLENOL) 160 MG/5ML solution 650 mg, 650 mg, Per Tube, Q4H PRN **OR** acetaminophen (TYLENOL) suppository 650 mg, 650 mg, Rectal, Q4H PRN, Agyei, Obed K, MD .  amLODipine (NORVASC) tablet 10 mg, 10 mg, Oral, Daily, Heard, Courtney S, NP, 10 mg at 04/08/20 0943 .  atorvastatin (LIPITOR) tablet 80 mg, 80 mg, Oral, Daily, Agyei, Obed K, MD, 80 mg at 04/08/20 0945 .  clopidogrel (PLAVIX) tablet 75 mg, 75 mg, Oral, Daily, Rosalin Hawking, MD, 75 mg at 04/08/20 0943 .  enoxaparin (LOVENOX) injection 40 mg, 40 mg, Subcutaneous, Q24H, Agyei, Obed K, MD, 40 mg at 04/07/20 1809 .  labetalol (NORMODYNE) injection 5 mg, 5 mg, Intravenous, Q4H PRN, Heard, Courtney S, NP .  LORazepam (ATIVAN) injection 0-4 mg, 0-4 mg, Intravenous, Q12H **OR** LORazepam (ATIVAN) tablet 0-4  mg, 0-4 mg, Oral, Q12H, Paulla Dolly, MD .  nicotine (NICODERM CQ - dosed in mg/24 hr) patch 7 mg, 7 mg, Transdermal, Daily, Agyei, Obed K, MD, 7 mg at 04/08/20 0947 .  senna-docusate (Senokot-S) tablet 1 tablet, 1 tablet, Oral, QHS PRN, Agyei, Obed K, MD .  sodium chloride flush (NS) 0.9 % injection 3 mL, 3 mL, Intravenous, Once, Carmin Muskrat, MD .  thiamine tablet 100 mg, 100 mg, Oral, Daily, 100 mg at 04/08/20 0943 **OR** thiamine (B-1) injection 100 mg, 100 mg, Intravenous, Daily, Paulla Dolly, MD   Patients Current Diet:     Diet Order                      Diet Heart Room service appropriate? Yes; Fluid consistency: Thin  Diet effective now                      Precautions / Restrictions Precautions Precautions: Fall Restrictions Weight Bearing Restrictions: No    Has the patient had 2 or more falls or a fall with injury in the past year?Yes   Prior Activity Level Community (5-7x/wk): working as a Scientist, water quality, driving, no DME used prior to admit   Prior Functional Level Prior Function Level of Independence: Independent   Self Care: Did the patient need help bathing, dressing, using the toilet or eating?  Independent   Indoor Mobility: Did the patient need assistance with walking from room to room (with or without device)? Independent   Stairs: Did the patient need assistance with internal or external stairs (with or without device)? Independent   Functional Cognition: Did the patient need help planning regular tasks such as shopping or remembering to take medications? Independent   Home Assistive Devices / Equipment Home Assistive Devices/Equipment: None   Prior Device Use: Indicate devices/aids used by  the patient prior to current illness, exacerbation or injury? None of the above   Current Functional Level Cognition   Arousal/Alertness: Awake/alert Overall Cognitive Status: Impaired/Different from baseline Current Attention Level:  Sustained Orientation Level: Oriented X4 Safety/Judgement: Decreased awareness of deficits General Comments: not as aware of L side needing cues for keeping hand oriented correctly on walker and A for keeping walker straight Attention: Focused, Sustained, Selective Focused Attention: Appears intact Sustained Attention: Impaired Sustained Attention Impairment: Verbal basic Selective Attention: Impaired Selective Attention Impairment: Verbal basic, Functional basic Memory: Impaired (recalled 2/5 words, did not recognize the other 3 given multiple choice) Behaviors: Poor frustration tolerance Comments: 17/30 SLUMS    Extremity Assessment (includes Sensation/Coordination)   Upper Extremity Assessment: LUE deficits/detail LUE Deficits / Details: grossly 3/5 MMT, decreased coordination and sensation  LUE Sensation: decreased light touch LUE Coordination: decreased fine motor, decreased gross motor  Lower Extremity Assessment: Defer to PT evaluation LLE Deficits / Details: Pt reporting pain, numbness and weakness. Able to move through full AROM. Pt not agreeable to MMT. LLE Sensation: decreased proprioception LLE Coordination: decreased gross motor     ADLs   Overall ADL's : Needs assistance/impaired Grooming: Min guard, Standing Grooming Details (indicate cue type and reason): increased time to manage items with L hand, min guard standing  Upper Body Bathing: Set up, Sitting Lower Body Bathing: Sit to/from stand, Minimal assistance Upper Body Dressing : Minimal assistance, Sitting Lower Body Dressing: Minimal assistance, Sit to/from stand Toilet Transfer: Minimal assistance, Ambulation Toilet Transfer Details (indicate cue type and reason): simulated in room Functional mobility during ADLs: Minimal assistance, Moderate assistance, Cueing for safety General ADL Comments: pt limited by L sided weakness, impaired balance and decreased safety awareness      Mobility   Overal bed  mobility: Needs Assistance Bed Mobility: Supine to Sit, Sit to Supine Supine to sit: Supervision, HOB elevated Sit to supine: Min guard General bed mobility comments: A for getting L LE fully onto bed when going to supine     Transfers   Overall transfer level: Needs assistance Equipment used: 1 person hand held assist, Rolling walker (2 wheeled), Straight cane Transfers: Sit to/from Stand Sit to Stand: Min assist General transfer comment: A for balance, safety     Ambulation / Gait / Stairs / Wheelchair Mobility   Ambulation/Gait Ambulation/Gait assistance: Min assist, Mod assist Gait Distance (Feet): 100 Feet (&150') Assistive device: Rolling walker (2 wheeled), 1 person hand held assist, Straight cane Gait Pattern/deviations: Step-through pattern, Step-to pattern, Decreased dorsiflexion - left, Decreased step length - left General Gait Details: L foot clearance fair throughout though slower and decreased with fatigue, initially with 1 HHA, then used RW (too wide and tall, but pt requesting to use), then practiced few feet with SPC in R hand and min to mod A for balance, then to/from bathroom end of session with HHA Gait velocity: decreased Gait velocity interpretation: 1.31 - 2.62 ft/sec, indicative of limited community ambulator Stairs: Yes Stairs assistance: Min assist Stair Management: Step to pattern, Sideways, Two rails Number of Stairs: 2 General stair comments: practiced for strenthening and technique leading with R up and L down     Posture / Balance Balance Overall balance assessment: Needs assistance Sitting-balance support: No upper extremity supported, Feet supported Sitting balance-Leahy Scale: Good Standing balance support: Single extremity supported, During functional activity Standing balance-Leahy Scale: Poor Standing balance comment: toileting and stood on her own using railing in bathroom and holding door frame  Special needs/care consideration n/a         Previous Home Environment (from acute therapy documentation) Type of Home: [place]: No   Discharge Living Setting Plans for Discharge Living Setting: Alone, Lives with (comment) (brother) Type of Home at Discharge: House Discharge Home Layout: Two level, Able to live on main level with bedroom/bathroom Discharge Home Access: Stairs to enter Entrance Stairs-Rails: None Entrance Stairs-Number of Steps: 2-3 Discharge Bathroom Shower/Tub: Tub/shower unit Discharge Bathroom Toilet: Standard Discharge Bathroom Accessibility: Yes How Accessible: Accessible via walker Does the patient have any problems obtaining your medications?: Yes (Describe) (uninsured)   Social/Family/Support Systems Anticipated Caregiver: brother Merry Proud) and Mother Albin Felling) Anticipated Caregiver's Contact Information: Albin Felling [phone]) Ability/Limitations of Caregiver: Albin Felling works but confirms that pt has discussed with brother 24/7 Caregiver Availability: 24/7 Discharge Plan Discussed with Primary Caregiver: Yes Is Caregiver In Agreement with Plan?: Yes Does Caregiver/Family have Issues with Lodging/Transportation while Pt is in Rehab?: No     Goals Patient/Family Goal for Rehab: PT/OT supervision to mod I Expected length of stay: 14-16 days Additional Information: pt states she is not homeless Pt/Family Agrees to Admission and willing to participate: Yes Program Orientation Provided & Reviewed with Pt/Caregiver Including Roles  & Responsibilities: Yes  Barriers to Discharge: Insurance for SNF coverage, Decreased caregiver support     Decrease burden of Care through IP rehab admission: n/a   Possible need for SNF placement upon discharge: not anticipated   Patient Condition: This patient's condition remains as documented in the consult dated 04/07/2020, in which the Rehabilitation Physician determined and documented that the patient's condition is appropriate for intensive  rehabilitative care in an inpatient rehabilitation facility. Will admit to inpatient rehab today.   Preadmission Screen Completed By:  Michel Santee, PT, DPT 04/08/2020 1:36 PM ______________________________________________________________________   Discussed status with Dr. Posey Pronto on 04/08/20 at 1:39 PM and received approval for admission today.   Admission Coordinator:  Michel Santee, PT, DPT time 1:39 PM Sudie Grumbling 04/08/20

## 2020-04-08 NOTE — Progress Notes (Addendum)
STROKE TEAM PROGRESS NOTE   Interval History: No acute events overnight. She continues to have a numb type pain localized to her LUE + LLE. No neurological decline noted by patient.    Vitals:   04/07/20 1944 04/08/20 0012 04/08/20 0341 04/08/20 0814  BP: (!) 162/79 (!) 170/79 (!) 164/103 (!) 170/107  Pulse: 98 85 79 85  Resp: 19 19 17 18   Temp: 97.7 F (36.5 C) 98.3 F (36.8 C) 98 F (36.7 C) 97.8 F (36.6 C)  TempSrc: Oral Oral Oral Oral  SpO2: 99% 97% 100% 99%  Weight: 59.1 kg     Height: 5\' 10"  (1.778 m)      Stroke Labs: 04/05/20 Lipid Panel: Total cholesterol 254,Triglycerides 95, HDL 62, LDL 173  04/05/20 Hemoglobin A1C: 6.3 04/07/20 UDS: Positive for Cocaine and Benzodiazepines   Pertinent Imaging:   04/05/20 CT Head WO Contrast:  Scattered areas of decreased attenuation are noted involving the right thalamus, right basal ganglia and right deep white matter in the parietal lobe consistent with acute to subacute ischemia. Scattered areas of decreased attenuation on the right as described consistent with likely subacute ischemia given the clinical history.  04/06/20 CT Angio Head and Neck: CTA neck: The common carotid, internal carotid and vertebral arteries are patent within the neck without hemodynamically significant stenosis. Mild calcified plaque within the visualized aortic arch, proximal major branch vessels of the neck and carotid bifurcations.  04/05/20 CTA head: No intracranial large vessel occlusion or proximal high-grade arterial stenosis. Mild calcified plaque within both intracranial internal carotid arteries with no more than mild stenosis.  04/06/20 Echocardiogram Complete w/Bubble Study: 1. Left ventricular ejection fraction, by estimation, is 65 to 70%. The left ventricle has hyperdynamic function. The left ventricle has no regional wall motion abnormalities. There is moderate left ventricular  hypertrophy. Left ventricular diastolic parameters are  consistent with Grade I diastolic dysfunction (impaired relaxation). There was a left ventricular mid-cavity gradient peak 20 mmHg.  2. Right ventricular systolic function is normal. The right ventricular size is normal. Tricuspid regurgitation signal is inadequate for assessing PA pressure.  3. The aortic valve is tricuspid. Aortic valve regurgitation is not visualized. No aortic stenosis is present.  4. The mitral valve is normal in structure. No evidence of mitral valve regurgitation. No evidence of mitral stenosis.  5. The inferior vena cava is normal in size with greater than 50% respiratory variability, suggesting right atrial pressure of 3 mmHg.   Physical Examination  Constitutional: Alert, resting in bed Neurological Examination MS: AAOx4 Speech: Fluent with repetition and naming intact, no dysarthria  Cranial nerves: EOMI, VFF, PERRL, Face symmetric, V1/V2/V3 sensation intact  Strength: No drifts, RUE 5/5 LUE +4/5 RLE 5/5 LLE +4/5  Coordination: Intact FNF and HTS  Sensation: Decreased sensation to LUE + LLE  Gait: Deferred  NIHSS: 1 for sensation deficit to the left side    Assessment and Plan:  [name] is a 49 y.o. female w/pmh of HTN, tobacco abuse (1 pack/day) alcohol use disorder (reportedly 6 beers per day), homelessness who presents with 1 week of left sided weakness, found to have a acute to subacute strokes in the right thalamus, right basal ganglia and deep white matter of the right parietal lobe as seen on CTH. Given her symptoms started a week prior to presentation she was outside the time window for IVTPA + Thrombectomy.   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 When originally admitted, the patient refused to have an MRI due to being claustrophobic. Her MRI Brain WO Contrast showed a 1 cm focus of mild diffusion abnormality at the posterior  limb of the right internal capsule, consistent with a small subacute ischemic infarct. Her CTA Head and Neck was relatively unremarkable with no significant stenosis noted. Echocardiogram revealed an EF of 65-70 %, hyperdynamic function of the left ventricle, no intracardiac thrombus. Stroke labs were notable for LDL of 173 and A1C 6.3. More than likely, her strokes are in the setting of small vessel disease due to smoking, HTN and HLD. She was started on DAPT and Atorvastatin while hospitalized for secondary stroke prevention. Unfortunately, due to an itching reaction that she has had when taking aspirin in the past, she is unable to continue on aspirin. On 04/08/20 it was stopped and she is to continue on Plavix monotherapy for secondary stroke prevention.  - Plavix 75 mg for secondary stroke prevention ( can not tolerate aspirin due to itching and refuses to take it)  - Continue Atorvastatin 80 mg for secondary stroke prevention, goal LDL is < 70 her LDL is 173 - Smoking education was provided and she was instructed to quit smoking. Nicotine patches have been given to her this hospitalization.  - PT/OT have evaluated her and recommend CIR.  #Neuropathic Pain  She has pain localized to her LUE and LLE that began when her stroke symptoms started. She describes the pain as numbing. If it continues to bother her, can consider Gabapentin 100 mg TID and increase as tolerated.   CARDS #HTN  Her symptoms have been going on for a week prior to presenting to the hospital and she is outside of the permissive HTN. Blood pressure can be lowered at this time, making sure to avoid significant acute drops. Vital signs over the past 24 hours have trended 150-170. - Continue Amlodipine 10 mg and add additional anti hypertensive agents as needed to control blood pressure  - IV Labetalol 5 mg Q4 hr PRN for SBP > 180    ENDO  #Prediabetes Her A1C this admission was noted to be 6.3, and is at goal of <7. Recommend  following up with her PCP for prediabetes. Can initiate SSI as necessary while hospitalized.   GI  She is on a heart healthy diet w/thin liquids, no dysphagia issues   HEME #DVT Prophylaxis  Continue Lovenox 40 mg SQ for DVT Prophylaxis   Hospital day # 2   Neurology will sign off at this time, please feel free to reach out via Amion with any questions.   [name], NP  [facility] Nurse Practitioner Patient seen and discussed with attending physician Dr. Erlinda Hong   ATTENDING NOTE: I reviewed above note and agree with the assessment and plan.   No acute event overnight.  Patient neuro stable.  Limited MRI confirmed right internal capsule lacunar infarct.  Chronic microhemorrhages throughout the brain likely due to uncontrolled hypertension.  PT/OT recommend CIR. currently pending CIR placement.  Patient stated that she has aspirin allergy, therefore recommend continue Plavix and statin for stroke prevention.   Neurology will sign off. Please call with questions. Pt will follow up with stroke clinic NP at [facility] in about 4 weeks. Thanks for the consult.   Rosalin Hawking, MD PhD Stroke Neurology 04/08/2020 10:29 AM     To contact Stroke Continuity provider, please refer to [email or web address] After hours, contact General  Neurology

## 2020-04-08 NOTE — Progress Notes (Signed)
Inpatient Rehab Admissions:  Inpatient Rehab Consult received.  I met with patient at the bedside for rehabilitation assessment and to discuss goals and expectations of an inpatient rehab admission.  Pt is open to CIR, has questions about getting screened for Medicaid, and reports that she plans to discharge with her brother who can provide 24/7 support.  I also spoke to her mother on the phone who confirms pt will have support at discharge between her and pt's brother.  Dr. Coy Saunas in agreement for pt to admit to CIR today.  I will let pt/family and TOC team know.   Signed: Shann Medal, PT, DPT Admissions Coordinator [phone] 04/08/20  1:35 PM

## 2020-04-08 NOTE — Progress Notes (Signed)
Physical Medicine and Rehabilitation Consult Reason for Consult: Left side weakness Referring Physician: [facility]     HPI: Roberta Calderon is a 49 y.o. right-handed female with history of hypertension and tobacco abuse presented 04/06/2020 with left-sided weakness.  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  She has a mother and daughter in the area.  Cranial CT scan showed scattered areas of decreased attenuation on the right consistent with subacute ischemia.  CTA of head and neck no intracranial large vessel occlusion or proximal high-grade stenosis.  Echocardiogram with ejection fraction of 67% grade 1 diastolic dysfunction.  Admission chemistries glucose 108, hemoglobin 15.2.  TSH 0.887, SARS coronavirus negative, urine drug screen positive cocaine and benzos..  Currently maintained on aspirin and Plavix for CVA prophylaxis.  Subcutaneous Lovenox for DVT prophylaxis.  Tolerating a regular diet.  Therapy evaluations completed with recommendations of physical medicine rehab consult.   Pt said had BM last night- urinating OK per pt.      Review of Systems  Constitutional: Negative for chills and fever.  HENT: Negative for hearing loss.   Eyes: Negative for blurred vision and double vision.  Respiratory: Negative for cough and shortness of breath.   Cardiovascular: Negative for chest pain, palpitations and leg swelling.  Gastrointestinal: Positive for constipation. Negative for heartburn, nausea and vomiting.  Genitourinary: Negative for flank pain and hematuria.  Musculoskeletal: Positive for myalgias.  Skin: Negative for rash.  Neurological: Positive for dizziness and headaches.  All other systems reviewed and are negative.       Past Medical History:  Diagnosis Date  . Hypertension      History reviewed. No pertinent surgical history. No family history on file. Social History:  reports that she has been smoking  cigarettes. She has been smoking about 0.25 packs per day. She has never used smokeless tobacco. She reports current alcohol use. She reports that she does not use drugs. Allergies:      Allergies  Allergen Reactions  . Nsaids Nausea And Vomiting and Rash          Medications Prior to Admission  Medication Sig Dispense Refill  . hydrochlorothiazide (HYDRODIURIL) 25 MG tablet Take 1 tablet (25 mg total) by mouth daily. (Patient not taking: Reported on 04/05/2020) 30 tablet 0  . HYDROcodone-acetaminophen (NORCO/VICODIN) 5-325 MG tablet Take 2 tablets by mouth every 4 (four) hours as needed. (Patient not taking: Reported on 04/05/2020) 16 tablet 0  . methocarbamol (ROBAXIN) 500 MG tablet Take 1 tablet (500 mg total) by mouth 2 (two) times daily. (Patient not taking: Reported on 04/05/2020) 20 tablet 0      Home: Home Living Family/patient expects to be discharged to:: Shelter/Homeless  Functional History: Prior Function Level of Independence: Independent Functional Status:  Mobility: Bed Mobility Overal bed mobility: Needs Assistance Bed Mobility: Supine to Sit, Sit to Supine Supine to sit: HOB elevated, Min guard Sit to supine: Min guard General bed mobility comments: increased time, min guard for safety Transfers Overall transfer level: Needs assistance Equipment used: 2 person hand held assist Transfers: Sit to/from Stand Sit to Stand: +2 safety/equipment, Min assist General transfer comment: assist to power up and stabilize balance Ambulation/Gait Ambulation/Gait assistance: Mod assist, +2 safety/equipment Gait Distance (Feet): 130 Feet Assistive device: 2 person hand held assist Gait Pattern/deviations: Step-through pattern, Drifts right/left General Gait Details: multiple episodes of LOB requiring mod assist to maintain balance, increasing  in frequency with fatigue Gait velocity: decreased Gait velocity interpretation: 1.31 - 2.62 ft/sec, indicative of limited community  ambulator   ADL: ADL Overall ADL's : Needs assistance/impaired Grooming: Min guard, Standing Grooming Details (indicate cue type and reason): increased time to manage items with L hand, min guard standing  Upper Body Bathing: Set up, Sitting Lower Body Bathing: Sit to/from stand, Minimal assistance Upper Body Dressing : Minimal assistance, Sitting Lower Body Dressing: Minimal assistance, Sit to/from stand Toilet Transfer: Minimal assistance, Ambulation Toilet Transfer Details (indicate cue type and reason): simulated in room Functional mobility during ADLs: Minimal assistance, Moderate assistance, Cueing for safety General ADL Comments: pt limited by L sided weakness, impaired balance and decreased safety awareness    Cognition: Cognition Overall Cognitive Status: No family/caregiver present to determine baseline cognitive functioning Orientation Level: Oriented X4 Cognition Arousal/Alertness: Awake/alert Behavior During Therapy: Flat affect Overall Cognitive Status: No family/caregiver present to determine baseline cognitive functioning General Comments: Subdued, most likely due to receiving Ativan overnight. Following simple commands consistently. Decreased safety awareness. Further cognitive assessment required, pt with minimal verbalizations and decreased engagement with therapist (attempted cog screen, but pt declined to answer therapist)   Blood pressure (!) 162/99, pulse 65, temperature 97.7 F (36.5 C), temperature source Oral, resp. rate 18, height 5\' 10"  (1.778 m), SpO2 100 %. Physical Exam Vitals and nursing note reviewed.  Constitutional:      Comments: Pt sleepy- didn't want to wake up- appropriate, Oriented x3- per chart had said she initially was agitated/confused?;  Does c/o L leg aching/painful- not TTP  HENT:     Head: Normocephalic and atraumatic.     Comments: Mild L facial droop- tongue midline- not coated    Right Ear: External ear normal.     Left Ear:  External ear normal.     Nose: Nose normal. No congestion.     Mouth/Throat:     Mouth: Mucous membranes are dry.     Pharynx: Oropharynx is clear. No oropharyngeal exudate.  Eyes:     Comments: 90% of time eyes closed- kept trying to sleep However EOMI B/L when tested- no nystagmus seen  Cardiovascular:     Comments: RRR- no M/R/G Pulmonary:     Comments: CTA B/L- no W/R/R- good air movement  Abdominal:     Comments: Soft, NT, ND, (+)BS   Musculoskeletal:     Cervical back: Normal range of motion and neck supple. No rigidity.     Comments: RUE- 5-/5- poor effort/poor compliance RLE 5-/5 as well LUE- 3/5 in biceps, triceps, WE, grip and finger abd- not sure if effort played a role since kept falling asleep LLE- 2/5 in HF, KE, KF, DF and PF  Skin:    Comments: IV in R forearm- looks good-   Neurological:     Mental Status: She is alert.     Comments: Patient is alert in no acute distress.  Oriented x3.  Follows commands.  Fair awareness of deficits.  Sleepy- didn't wake up well-  Poor compliance with exam Eyes closed 90% of time Decreased sensation to light touch in LUE/LLE   Psychiatric:     Comments: Sleepy- vague        Lab Results Last 24 Hours       Results for orders placed or performed during the hospital encounter of 04/05/20 (from the past 24 hour(s))  TSH     Status: None    Collection Time: 04/06/20 11:05 AM  Result Value Ref Range  TSH 0.887 0.350 - 4.500 uIU/mL  Sedimentation rate     Status: None    Collection Time: 04/06/20 11:05 AM  Result Value Ref Range    Sed Rate 5 0 - 22 mm/hr  RPR     Status: None    Collection Time: 04/06/20 11:05 AM  Result Value Ref Range    RPR Ser Ql NON REACTIVE NON REACTIVE  HIV Antibody (routine testing w rflx)     Status: None    Collection Time: 04/06/20 11:05 AM  Result Value Ref Range    HIV Screen 4th Generation wRfx Non Reactive Non Reactive  CBC with Differential/Platelet     Status: Abnormal    Collection  Time: 04/06/20 11:05 AM  Result Value Ref Range    WBC 4.6 4.0 - 10.5 K/uL    RBC 5.35 (H) 3.87 - 5.11 MIL/uL    Hemoglobin 13.6 12.0 - 15.0 g/dL    HCT 44.8 36 - 46 %    MCV 83.7 80.0 - 100.0 fL    MCH 25.4 (L) 26.0 - 34.0 pg    MCHC 30.4 30.0 - 36.0 g/dL    RDW 14.3 11.5 - 15.5 %    Platelets 299 150 - 400 K/uL    nRBC 0.0 0.0 - 0.2 %    Neutrophils Relative % 45 %    Neutro Abs 2.1 1.7 - 7.7 K/uL    Lymphocytes Relative 43 %    Lymphs Abs 2.0 0.7 - 4.0 K/uL    Monocytes Relative 7 %    Monocytes Absolute 0.3 0 - 1 K/uL    Eosinophils Relative 4 %    Eosinophils Absolute 0.2 0 - 0 K/uL    Basophils Relative 1 %    Basophils Absolute 0.0 0 - 0 K/uL    Immature Granulocytes 0 %    Abs Immature Granulocytes 0.01 0.00 - 0.07 K/uL  Urinalysis, Routine w reflex microscopic     Status: Abnormal    Collection Time: 04/07/20  8:25 AM  Result Value Ref Range    Color, Urine YELLOW YELLOW    APPearance HAZY (A) CLEAR    Specific Gravity, Urine 1.014 1.005 - 1.030    pH 6.0 5.0 - 8.0    Glucose, UA NEGATIVE NEGATIVE mg/dL    Hgb urine dipstick NEGATIVE NEGATIVE    Bilirubin Urine NEGATIVE NEGATIVE    Ketones, ur NEGATIVE NEGATIVE mg/dL    Protein, ur NEGATIVE NEGATIVE mg/dL    Nitrite NEGATIVE NEGATIVE    Leukocytes,Ua NEGATIVE NEGATIVE  Urine rapid drug screen (hosp performed)     Status: Abnormal    Collection Time: 04/07/20  8:26 AM  Result Value Ref Range    Opiates NONE DETECTED NONE DETECTED    Cocaine POSITIVE (A) NONE DETECTED    Benzodiazepines POSITIVE (A) NONE DETECTED    Amphetamines NONE DETECTED NONE DETECTED    Tetrahydrocannabinol NONE DETECTED NONE DETECTED    Barbiturates NONE DETECTED NONE DETECTED       Imaging Results (Last 48 hours)  CT Code Stroke CTA Head W/WO contrast   Result Date: 04/06/2020 CLINICAL DATA:  Neuro deficit, acute, stroke suspected. Additional history provided: Patient presents with weakness to left side for 1 week, worsening, left  arm with weak grip strength, drift to the left. EXAM: CT ANGIOGRAPHY HEAD AND NECK TECHNIQUE: Multidetector CT imaging of the head and neck was performed using the standard protocol during bolus administration of intravenous contrast. Multiplanar CT image reconstructions and  MIPs were obtained to evaluate the vascular anatomy. Carotid stenosis measurements (when applicable) are obtained utilizing NASCET criteria, using the distal internal carotid diameter as the denominator. CONTRAST:  4mL OMNIPAQUE IOHEXOL 350 MG/ML SOLN COMPARISON:  Head CT 04/05/2020 FINDINGS: CT HEAD FINDINGS Brain: Redemonstrated ischemic infarction changes within the right thalamus and thalamocapsular junction, possibly subacute. Redemonstrated age-indeterminate lacunar infarcts within the basal ganglia bilaterally. Unchanged chronic lacunar infarct within the left thalamus and paramedian posterior right pons. Mild background ill-defined hypoattenuation within cerebral white matter is advanced for age and nonspecific, but consistent with chronic small vessel ischemic disease. There is no acute intracranial hemorrhage. No demarcated cortical infarct is identified. No extra-axial fluid collection. No evidence of intracranial mass. No midline shift. Vascular: Reported below. Skull: Normal. Negative for fracture or focal lesion. Sinuses: No significant paranasal sinus disease or mastoid effusion Orbits: No acute orbital abnormality identified. Review of the MIP images confirms the above findings CTA NECK FINDINGS Aortic arch: Standard aortic branching. Minimal calcified plaque within the visualized aortic arch and proximal major branch vessels of the neck. No hemodynamically significant innominate or proximal subclavian artery stenosis. Right carotid system: CCA and ICA patent within the neck without significant stenosis (50% or greater). Mild calcified plaque at the carotid bifurcation. Left carotid system: CCA and ICA patent within the neck  without significant stenosis (50% or greater). Mild calcified plaque at the carotid bifurcation. Vertebral arteries: Codominant and patent within the neck without significant stenosis. Skeleton: No acute bony abnormality or aggressive osseous lesion. Advanced for age cervical spondylosis with multilevel disc space narrowing, posterior disc osteophytes, uncovertebral and facet hypertrophy. Other neck: No neck mass or cervical lymphadenopathy Upper chest: No consolidation within the imaged lung apices. Review of the MIP images confirms the above findings CTA HEAD FINDINGS Anterior circulation: The intracranial internal carotid arteries are patent. Calcified plaque within both vessels with no more than mild stenosis. The M1 middle cerebral arteries are patent without significant stenosis. No M2 proximal branch occlusion or high-grade proximal stenosis is identified. The anterior cerebral arteries are patent. No intracranial aneurysm is identified. Posterior circulation: The intracranial vertebral arteries are patent. The basilar artery is patent. The posterior cerebral arteries are patent. Posterior communicating arteries are hypoplastic or absent bilaterally. Venous sinuses: Within limitations of contrast timing, no convincing thrombus. Anatomic variants: As described Review of the MIP images confirms the above findings IMPRESSION: CT head: 1. Redemonstrated ischemic infarction changes within the right thalamus and thalamocapsular junction, possibly subacute. Consider brain MRI for further evaluation. 2. Redemonstrated age-indeterminate lacunar infarcts within the basal ganglia bilaterally. 3. 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 4. Mild background cerebral white matter chronic small vessel ischemic disease, which is advanced for age. CTA neck: 1. The common carotid, internal carotid and vertebral arteries are patent within the neck without hemodynamically significant stenosis. Mild calcified  plaque within the visualized aortic arch, proximal major branch vessels of the neck and carotid bifurcations. CTA head: 1. No intracranial large vessel occlusion or proximal high-grade arterial stenosis. 2. Mild calcified plaque within both intracranial internal carotid arteries with no more than mild stenosis. Electronically Signed   By: Kellie Simmering DO   On: 04/06/2020 13:18    CT Code Stroke CTA Neck W/WO contrast   Result Date: 04/06/2020 CLINICAL DATA:  Neuro deficit, acute, stroke suspected. Additional history provided: Patient presents with weakness to left side for 1 week, worsening, left arm with weak grip strength, drift to the left. EXAM: CT ANGIOGRAPHY HEAD  AND NECK TECHNIQUE: Multidetector CT imaging of the head and neck was performed using the standard protocol during bolus administration of intravenous contrast. Multiplanar CT image reconstructions and MIPs were obtained to evaluate the vascular anatomy. Carotid stenosis measurements (when applicable) are obtained utilizing NASCET criteria, using the distal internal carotid diameter as the denominator. CONTRAST:  36mL OMNIPAQUE IOHEXOL 350 MG/ML SOLN COMPARISON:  Head CT 04/05/2020 FINDINGS: CT HEAD FINDINGS Brain: Redemonstrated ischemic infarction changes within the right thalamus and thalamocapsular junction, possibly subacute. Redemonstrated age-indeterminate lacunar infarcts within the basal ganglia bilaterally. Unchanged chronic lacunar infarct within the left thalamus and paramedian posterior right pons. Mild background ill-defined hypoattenuation within cerebral white matter is advanced for age and nonspecific, but consistent with chronic small vessel ischemic disease. There is no acute intracranial hemorrhage. No demarcated cortical infarct is identified. No extra-axial fluid collection. No evidence of intracranial mass. No midline shift. Vascular: Reported below. Skull: Normal. Negative for fracture or focal lesion. Sinuses: No  significant paranasal sinus disease or mastoid effusion Orbits: No acute orbital abnormality identified. Review of the MIP images confirms the above findings CTA NECK FINDINGS Aortic arch: Standard aortic branching. Minimal calcified plaque within the visualized aortic arch and proximal major branch vessels of the neck. No hemodynamically significant innominate or proximal subclavian artery stenosis. Right carotid system: CCA and ICA patent within the neck without significant stenosis (50% or greater). Mild calcified plaque at the carotid bifurcation. Left carotid system: CCA and ICA patent within the neck without significant stenosis (50% or greater). Mild calcified plaque at the carotid bifurcation. Vertebral arteries: Codominant and patent within the neck without significant stenosis. Skeleton: No acute bony abnormality or aggressive osseous lesion. Advanced for age cervical spondylosis with multilevel disc space narrowing, posterior disc osteophytes, uncovertebral and facet hypertrophy. Other neck: No neck mass or cervical lymphadenopathy Upper chest: No consolidation within the imaged lung apices. Review of the MIP images confirms the above findings CTA HEAD FINDINGS Anterior circulation: The intracranial internal carotid arteries are patent. Calcified plaque within both vessels with no more than mild stenosis. The M1 middle cerebral arteries are patent without significant stenosis. No M2 proximal branch occlusion or high-grade proximal stenosis is identified. The anterior cerebral arteries are patent. No intracranial aneurysm is identified. Posterior circulation: The intracranial vertebral arteries are patent. The basilar artery is patent. The posterior cerebral arteries are patent. Posterior communicating arteries are hypoplastic or absent bilaterally. Venous sinuses: Within limitations of contrast timing, no convincing thrombus. Anatomic variants: As described Review of the MIP images confirms the above  findings IMPRESSION: CT head: 1. Redemonstrated ischemic infarction changes within the right thalamus and thalamocapsular junction, possibly subacute. Consider brain MRI for further evaluation. 2. Redemonstrated age-indeterminate lacunar infarcts within the basal ganglia bilaterally. 3. 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 4. Mild background cerebral white matter chronic small vessel ischemic disease, which is advanced for age. CTA neck: 1. The common carotid, internal carotid and vertebral arteries are patent within the neck without hemodynamically significant stenosis. Mild calcified plaque within the visualized aortic arch, proximal major branch vessels of the neck and carotid bifurcations. CTA head: 1. No intracranial large vessel occlusion or proximal high-grade arterial stenosis. 2. Mild calcified plaque within both intracranial internal carotid arteries with no more than mild stenosis. Electronically Signed   By: Kellie Simmering DO   On: 04/06/2020 13:18    ECHOCARDIOGRAM COMPLETE BUBBLE STUDY   Result Date: 04/06/2020    ECHOCARDIOGRAM REPORT   Patient Name:  Estill Bamberg Date of Exam: 04/06/2020 Medical Rec #:  [id number]          Height:       70.0 in Accession #:    [id number]         Weight:       137.0 lb Date of Birth:  03-Mar-1971           BSA:          1.777 m Patient Age:    15 years           BP:           175/109 mmHg Patient Gender: F                  HR:           69 bpm. Exam Location:  Inpatient Procedure: 2D Echo, Cardiac Doppler, Color Doppler and Saline Contrast Bubble            Study Indications:    Stroke  History:        Patient has no prior history of Echocardiogram examinations.                 Stroke; Risk Factors:Current Smoker and Hypertension.  Sonographer:    Roseanna Rainbow RDCS Referring Phys: [place]  1. Left ventricular ejection fraction, by estimation, is 65 to 70%. The left ventricle has hyperdynamic function. The left  ventricle has no regional wall motion abnormalities. There is moderate left ventricular hypertrophy. Left ventricular diastolic parameters are consistent with Grade I diastolic dysfunction (impaired relaxation). There was a left ventricular mid-cavity gradient peak 20 mmHg.  2. Right ventricular systolic function is normal. The right ventricular size is normal. Tricuspid regurgitation signal is inadequate for assessing PA pressure.  3. The aortic valve is tricuspid. Aortic valve regurgitation is not visualized. No aortic stenosis is present.  4. The mitral valve is normal in structure. No evidence of mitral valve regurgitation. No evidence of mitral stenosis.  5. The inferior vena cava is normal in size with greater than 50% respiratory variability, suggesting right atrial pressure of 3 mmHg. FINDINGS  Left Ventricle: Left ventricular ejection fraction, by estimation, is 65 to 70%. The left ventricle has hyperdynamic function. The left ventricle has no regional wall motion abnormalities. The left ventricular internal cavity size was normal in size. There is moderate left ventricular hypertrophy. Left ventricular diastolic parameters are consistent with Grade I diastolic dysfunction (impaired relaxation). Right Ventricle: The right ventricular size is normal. No increase in right ventricular wall thickness. Right ventricular systolic function is normal. Tricuspid regurgitation signal is inadequate for assessing PA pressure. Left Atrium: Left atrial size was normal in size. Right Atrium: Right atrial size was normal in size. Pericardium: There is no evidence of pericardial effusion. Mitral Valve: The mitral valve is normal in structure. Mild mitral annular calcification. No evidence of mitral valve regurgitation. No evidence of mitral valve stenosis. Tricuspid Valve: The tricuspid valve is normal in structure. Tricuspid valve regurgitation is not demonstrated. Aortic Valve: The aortic valve is tricuspid. Aortic valve  regurgitation is not visualized. No aortic stenosis is present. Pulmonic Valve: The pulmonic valve was normal in structure. Pulmonic valve regurgitation is not visualized. Aorta: The aortic root is normal in size and structure. Venous: The inferior vena cava is normal in size with greater than 50% respiratory variability, suggesting right atrial pressure of 3 mmHg. IAS/Shunts: No atrial level shunt detected by color flow Doppler.  Agitated saline contrast was given intravenously to evaluate for intracardiac shunting.  LEFT VENTRICLE PLAX 2D LVIDd:         3.30 cm     Diastology LVIDs:         2.10 cm     LV e' lateral:   5.66 cm/s LV PW:         1.50 cm     LV E/e' lateral: 9.8 LV IVS:        1.50 cm     LV e' medial:    4.46 cm/s LVOT diam:     1.90 cm     LV E/e' medial:  12.4 LV SV:         63 LV SV Index:   35 LVOT Area:     2.84 cm  LV Volumes (MOD) LV vol d, MOD A2C: 70.7 ml LV vol d, MOD A4C: 57.7 ml LV vol s, MOD A2C: 14.8 ml LV vol s, MOD A4C: 12.1 ml LV SV MOD A2C:     55.9 ml LV SV MOD A4C:     57.7 ml LV SV MOD BP:      51.1 ml RIGHT VENTRICLE             IVC RV S prime:     13.30 cm/s  IVC diam: 1.80 cm TAPSE (M-mode): 2.5 cm LEFT ATRIUM             Index       RIGHT ATRIUM           Index LA diam:        2.90 cm 1.63 cm/m  RA Area:     10.10 cm LA Vol (A2C):   43.8 ml 24.64 ml/m RA Volume:   20.20 ml  11.37 ml/m LA Vol (A4C):   37.8 ml 21.27 ml/m LA Biplane Vol: 41.0 ml 23.07 ml/m  AORTIC VALVE LVOT Vmax:   112.00 cm/s LVOT Vmean:  74.500 cm/s LVOT VTI:    0.221 m  AORTA Ao Root diam: 2.70 cm Ao Asc diam:  3.20 cm MITRAL VALVE MV Area (PHT): 1.86 cm    SHUNTS MV Decel Time: 408 msec    Systemic VTI:  0.22 m MV E velocity: 55.20 cm/s  Systemic Diam: 1.90 cm MV A velocity: 79.20 cm/s MV E/A ratio:  0.70 Loralie Champagne MD Electronically signed by Loralie Champagne MD Signature Date/Time: 04/06/2020/7:27:36 PM    Final          Assessment/Plan: Diagnosis: R thalamic, R basal ganglia, 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 Does the need for close, 24 hr/day medical supervision in concert with the patient's rehab needs make it unreasonable for this patient to be served in a less intensive setting? Yes 2. Co-Morbidities requiring supervision/potential complications: uncontrolled HTN< smoker, strokes- sedation, questionable agitation 3. Due to bladder management, safety, skin/wound care, disease management, medication administration, pain management and patient education, does the patient require 24 hr/day rehab nursing? Yes 4. Does the patient require coordinated care of a physician, rehab nurse, therapy disciplines of PT, OT, and SLP? to address physical and functional deficits in the context of the above medical diagnosis(es)? Yes Addressing deficits in the following areas: balance, endurance, locomotion, strength, transferring, bathing, dressing, feeding, grooming, toileting and cognition 5. Can the patient actively participate in an intensive therapy program of at least 3 hrs of therapy per day at least 5 days per week? Yes 6. The potential for  patient to make measurable gains while on inpatient rehab is good 7. Anticipated functional outcomes upon discharge from inpatient rehab are supervision and min assist  with PT, supervision with OT, supervision with SLP. 8. Estimated rehab length of stay to reach the above functional goals is: 7-10 days? 9. Anticipated discharge destination: Home 10. Overall Rehab/Functional Prognosis: good   RECOMMENDATIONS: This patient's condition is appropriate for continued rehabilitative care in the following setting: CIR Patient has agreed to participate in recommended program. Potentially Note that insurance prior authorization may be required for reimbursement for recommended care.   Comment:  1. Pt hasn't finished her CVA work up hasn't had ECHO, MRI, and no note for today so far- we saw early 2. Pt was (+) for benzo's and cocaine-  likely cause 3. Pt - not sure if has help to go home/be discharged safely- and pt was disinclined to answer questions.  4. Of note, pt interested in disability- asking about it- explained only if has disability past 1 year- explained.  5. Will submit for admissions coordinator care to check into ability to have someone care for her after d/c; do family education.  6. Thank you for this consult.    Lavon Paganini Angiulli, PA-C 04/07/2020      I have personally performed a face to face diagnostic evaluation of this patient and formulated the key components of the plan.  Additionally, I have personally reviewed laboratory data, imaging studies, as well as relevant notes and concur with the physician assistant's documentation above.

## 2020-04-08 NOTE — Progress Notes (Signed)
Patient discharged to [facility], report given to [facility].  Pt to go to [id number].

## 2020-04-08 NOTE — Discharge Summary (Deleted)
  The note originally documented on this encounter has been moved the the encounter in which it belongs.  

## 2020-04-08 NOTE — H&P (Signed)
Physical Medicine and Rehabilitation Admission H&P    Chief Complaint  Patient presents with  . Stroke Symptoms  : HPI: Roberta Calderon. Roberta Calderon is a 49 year old right-handed female with history of hypertension and tobacco/alcohol abuse who presented 04/06/2020 with left-sided weakness x1 week.  History taken from chart review and patient.  Patient lives alone works at [employer] independent prior to admission.  She has a mother and daughter in the area.  Cranial CT scan showed scattered areas of decreased attenuation on the right consistent with a subacute ischemia.  Patient did not receive TPA.  CTA of head and neck no intracranial large vessel occlusion or proximal high-grade stenosis.  MRI completed technically limited exam due to patient's inability to tolerate the study showed a 1 cm focus of mild diffusion abnormality of the posterior limb of the right internal capsule consistent with a small subacute infarction.  Suspected scattered chronic microhemorrhages clustered about the cerebellum, brainstem and deep gray nuclei.  Echocardiogram with ejection fraction of 70%.  Grade 1 diastolic dysfunction.  Admission chemistries urine drug screen positive cocaine and benzos, glucose 108, hemoglobin A1c 6.3, hemoglobin 15.2, TSH 0.887, SARS coronavirus negative.  Currently maintained on aspirin and Plavix for CVA prophylaxis x3 weeks days and then aspirin alone.  Subcutaneous Lovenox for DVT prophylaxis.  Tolerating a regular diet.  Therapy evaluations completed and patient was admitted for a comprehensive rehab program.  Please see preadmission assessment as well.  Review of Systems  Constitutional: Negative for chills and fever.  HENT: Negative for hearing loss.   Eyes: Negative for blurred vision and double vision.  Respiratory: Negative for cough and shortness of breath.   Cardiovascular: Negative for chest pain, palpitations and leg swelling.  Gastrointestinal: Positive for constipation.  Negative for heartburn, nausea and vomiting.  Genitourinary: Negative for flank pain and hematuria.  Musculoskeletal: Positive for myalgias.  Skin: Negative for rash.  Neurological: Positive for dizziness, sensory change, speech change, focal weakness, weakness and headaches.  All other systems reviewed and are negative.  Past Medical History:  Diagnosis Date  . Hypertension    No past surgical history. No pertinent family history of premature CVA. Social History:  reports that she has been smoking cigarettes. She has been smoking about 0.25 packs per day. She has never used smokeless tobacco. She reports current alcohol use. She reports that she does not use drugs. Allergies:  Allergies  Allergen Reactions  . Nsaids Nausea And Vomiting and Rash   Medications Prior to Admission  Medication Sig Dispense Refill  . hydrochlorothiazide (HYDRODIURIL) 25 MG tablet Take 1 tablet (25 mg total) by mouth daily. (Patient not taking: Reported on 04/05/2020) 30 tablet 0  . HYDROcodone-acetaminophen (NORCO/VICODIN) 5-325 MG tablet Take 2 tablets by mouth every 4 (four) hours as needed. (Patient not taking: Reported on 04/05/2020) 16 tablet 0  . methocarbamol (ROBAXIN) 500 MG tablet Take 1 tablet (500 mg total) by mouth 2 (two) times daily. (Patient not taking: Reported on 04/05/2020) 20 tablet 0    Drug Regimen Review Drug regimen was reviewed and remains appropriate with no significant issues identified  Home: Home Living Family/patient expects to be discharged to:: Shelter/Homeless Type of Home: Homeless   Functional History: Prior Function Level of Independence: Independent  Functional Status:  Mobility: Bed Mobility Overal bed mobility: Needs Assistance Bed Mobility: Supine to Sit, Sit to Supine Supine to sit: HOB elevated, Min guard Sit to supine: Min guard General bed mobility comments: increased time, min guard  for safety Transfers Overall transfer level: Needs  assistance Equipment used: 2 person hand held assist Transfers: Sit to/from Stand Sit to Stand: +2 safety/equipment, Min assist General transfer comment: assist to power up and stabilize balance Ambulation/Gait Ambulation/Gait assistance: Mod assist, +2 safety/equipment Gait Distance (Feet): 130 Feet Assistive device: 2 person hand held assist Gait Pattern/deviations: Step-through pattern, Drifts right/left General Gait Details: multiple episodes of LOB requiring mod assist to maintain balance, increasing in frequency with fatigue Gait velocity: decreased Gait velocity interpretation: 1.31 - 2.62 ft/sec, indicative of limited community ambulator    ADL: ADL Overall ADL's : Needs assistance/impaired Grooming: Min guard, Standing Grooming Details (indicate cue type and reason): increased time to manage items with L hand, min guard standing  Upper Body Bathing: Set up, Sitting Lower Body Bathing: Sit to/from stand, Minimal assistance Upper Body Dressing : Minimal assistance, Sitting Lower Body Dressing: Minimal assistance, Sit to/from stand Toilet Transfer: Minimal assistance, Ambulation Toilet Transfer Details (indicate cue type and reason): simulated in room Functional mobility during ADLs: Minimal assistance, Moderate assistance, Cueing for safety General ADL Comments: pt limited by L sided weakness, impaired balance and decreased safety awareness   Cognition: Cognition Overall Cognitive Status: No family/caregiver present to determine baseline cognitive functioning Arousal/Alertness: Awake/alert Orientation Level: Oriented X4 Attention: Focused, Sustained, Selective Focused Attention: Appears intact Sustained Attention: Impaired Sustained Attention Impairment: Verbal basic Selective Attention: Impaired Selective Attention Impairment: Verbal basic, Functional basic Memory: Impaired (recalled 2/5 words, did not recognize the other 3 given multiple choice) Behaviors: Poor  frustration tolerance Comments: 17/30 SLUMS Cognition Arousal/Alertness: Awake/alert Behavior During Therapy: Flat affect Overall Cognitive Status: No family/caregiver present to determine baseline cognitive functioning General Comments: Subdued, most likely due to receiving Ativan overnight. Following simple commands consistently. Decreased safety awareness. Further cognitive assessment required, pt with minimal verbalizations and decreased engagement with therapist (attempted cog screen, but pt declined to answer therapist)  Physical Exam: Blood pressure (!) 164/103, pulse 79, temperature 98 F (36.7 C), temperature source Oral, resp. rate 17, height 5\' 10"  (1.778 m), weight 59.1 kg, SpO2 100 %. Physical Exam Vitals reviewed.  Constitutional:      General: She is not in acute distress.    Appearance: Normal appearance.  HENT:     Head: Normocephalic and atraumatic.     Right Ear: External ear normal.     Left Ear: External ear normal.     Nose: Nose normal.  Eyes:     General:        Right eye: No discharge.        Left eye: No discharge.     Extraocular Movements: Extraocular movements intact.  Cardiovascular:     Rate and Rhythm: Normal rate and regular rhythm.  Pulmonary:     Effort: Pulmonary effort is normal. No respiratory distress.     Breath sounds: Normal breath sounds. No stridor.  Abdominal:     General: Abdomen is flat. Bowel sounds are normal. There is no distension.  Musculoskeletal:     Cervical back: Normal range of motion and neck supple.     Comments: No edema or tenderness in extremities  Skin:    General: Skin is warm and dry.  Neurological:     Mental Status: She is alert.     Comments: Alert  Provides her name age and place.   Follows simple commands. Motor: LUE: 3+/5 proximal distal LLE: 3+/5 proximal distal Dysarthria Left-sided numbness  Psychiatric:        Mood and  Affect: Affect is blunt.        Speech: Speech is delayed.     Results  for orders placed or performed during the hospital encounter of 04/05/20 (from the past 48 hour(s))  SARS Coronavirus 2 by RT PCR (hospital order, performed in [facility] hospital lab) Nasopharyngeal Nasopharyngeal Swab     Status: None   Collection Time: 04/06/20  9:15 AM   Specimen: Nasopharyngeal Swab  Result Value Ref Range   SARS Coronavirus 2 NEGATIVE NEGATIVE    Comment: (NOTE) SARS-CoV-2 target nucleic acids are NOT DETECTED.  The SARS-CoV-2 RNA is generally detectable in upper and lower respiratory specimens during the acute phase of infection. The lowest concentration of SARS-CoV-2 viral copies this assay can detect is 250 copies / mL. A negative result does not preclude SARS-CoV-2 infection and should not be used as the sole basis for treatment or other patient management decisions.  A negative result may occur with improper specimen collection / handling, submission of specimen other than nasopharyngeal swab, presence of viral mutation(s) within the areas targeted by this assay, and inadequate number of viral copies (<250 copies / mL). A negative result must be combined with clinical observations, patient history, and epidemiological information.  Fact Sheet for Patients:   StrictlyIdeas.no  Fact Sheet for Healthcare Providers: BankingDealers.co.za  This test is not yet approved or  cleared by the [place] FDA and has been authorized for detection and/or diagnosis of SARS-CoV-2 by FDA under an Emergency Use Authorization (EUA).  This EUA will remain in effect (meaning this test can be used) for the duration of the COVID-19 declaration under Section 564(b)(1) of the Act, 21 U.S.C. section 360bbb-3(b)(1), unless the authorization is terminated or revoked sooner.  Performed at [facility], [place] [address] [place], [place] [address]   TSH     Status: None   Collection Time: 04/06/20 11:05 AM  Result Value Ref  Range   TSH 0.887 0.350 - 4.500 uIU/mL    Comment: Performed by a 3rd Generation assay with a functional sensitivity of <=0.01 uIU/mL. Performed at [facility], [place] [address] [place], [place] [address]   Sedimentation rate     Status: None   Collection Time: 04/06/20 11:05 AM  Result Value Ref Range   Sed Rate 5 0 - 22 mm/hr    Comment: Performed at [facility] [address], [place], [place] [address]  RPR     Status: None   Collection Time: 04/06/20 11:05 AM  Result Value Ref Range   RPR Ser Ql NON REACTIVE NON REACTIVE    Comment: Performed at [facility], [place] [address], [place], [place] [address]  HIV Antibody (routine testing w rflx)     Status: None   Collection Time: 04/06/20 11:05 AM  Result Value Ref Range   HIV Screen 4th Generation wRfx Non Reactive Non Reactive    Comment: Performed at [facility], [place] [address], [place], [place] [address]  CBC with Differential/Platelet     Status: Abnormal   Collection Time: 04/06/20 11:05 AM  Result Value Ref Range   WBC 4.6 4.0 - 10.5 K/uL   RBC 5.35 (H) 3.87 - 5.11 MIL/uL   Hemoglobin 13.6 12.0 - 15.0 g/dL   HCT 44.8 36 - 46 %   MCV 83.7 80.0 - 100.0 fL   MCH 25.4 (L) 26.0 - 34.0 pg   MCHC 30.4 30.0 - 36.0 g/dL  RDW 14.3 11.5 - 15.5 %   Platelets 299 150 - 400 K/uL   nRBC 0.0 0.0 - 0.2 %   Neutrophils Relative % 45 %   Neutro Abs 2.1 1.7 - 7.7 K/uL   Lymphocytes Relative 43 %   Lymphs Abs 2.0 0.7 - 4.0 K/uL   Monocytes Relative 7 %   Monocytes Absolute 0.3 0 - 1 K/uL   Eosinophils Relative 4 %   Eosinophils Absolute 0.2 0 - 0 K/uL   Basophils Relative 1 %   Basophils Absolute 0.0 0 - 0 K/uL   Immature Granulocytes 0 %   Abs Immature Granulocytes 0.01 0.00 - 0.07 K/uL    Comment: Performed at [facility] [address], [place], [place] [address]  Erythropoietin     Status: None   Collection Time: 04/06/20 11:12 AM  Result Value Ref Range   Erythropoietin 5.5 2.6 - 18.5 mIU/mL     Comment: (NOTE) [employer] UniCel DxI 800 Immunoassay System Values obtained with different assay methods or kits cannot be used interchangeably. Results cannot be interpreted as absolute evidence of the presence or absence of malignant disease. Performed At: [facility] [place], [place] [id number] Rush Farmer MD TD:[id number]   Urinalysis, Routine w reflex microscopic     Status: Abnormal   Collection Time: 04/07/20  8:25 AM  Result Value Ref Range   Color, Urine YELLOW YELLOW   APPearance HAZY (A) CLEAR   Specific Gravity, Urine 1.014 1.005 - 1.030   pH 6.0 5.0 - 8.0   Glucose, UA NEGATIVE NEGATIVE mg/dL   Hgb urine dipstick NEGATIVE NEGATIVE   Bilirubin Urine NEGATIVE NEGATIVE   Ketones, ur NEGATIVE NEGATIVE mg/dL   Protein, ur NEGATIVE NEGATIVE mg/dL   Nitrite NEGATIVE NEGATIVE   Leukocytes,Ua NEGATIVE NEGATIVE    Comment: Performed at [facility] [address] [place], [place] [address]  Urine rapid drug screen (hosp performed)     Status: Abnormal   Collection Time: 04/07/20  8:26 AM  Result Value Ref Range   Opiates NONE DETECTED NONE DETECTED   Cocaine POSITIVE (A) NONE DETECTED   Benzodiazepines POSITIVE (A) NONE DETECTED   Amphetamines NONE DETECTED NONE DETECTED   Tetrahydrocannabinol NONE DETECTED NONE DETECTED   Barbiturates NONE DETECTED NONE DETECTED    Comment: (NOTE) DRUG SCREEN FOR MEDICAL PURPOSES ONLY.  IF CONFIRMATION IS NEEDED FOR ANY PURPOSE, NOTIFY LAB WITHIN 5 DAYS.  LOWEST DETECTABLE LIMITS FOR URINE DRUG SCREEN Drug Class                     Cutoff (ng/mL) Amphetamine and metabolites    1000 Barbiturate and metabolites    200 Benzodiazepine                 485 Tricyclics and metabolites     300 Opiates and metabolites        300 Cocaine and metabolites        300 THC                            50 Performed at [facility], [place] [address], [place], [place] [address]    CT Code Stroke CTA  Head W/WO contrast  Result Date: 04/06/2020 CLINICAL DATA:  Neuro deficit, acute, stroke suspected. Additional history provided: Patient presents with weakness to left side for 1 week, worsening, left arm with weak grip strength,  drift to the left. EXAM: CT ANGIOGRAPHY HEAD AND NECK TECHNIQUE: Multidetector CT imaging of the head and neck was performed using the standard protocol during bolus administration of intravenous contrast. Multiplanar CT image reconstructions and MIPs were obtained to evaluate the vascular anatomy. Carotid stenosis measurements (when applicable) are obtained utilizing NASCET criteria, using the distal internal carotid diameter as the denominator. CONTRAST:  88mL OMNIPAQUE IOHEXOL 350 MG/ML SOLN COMPARISON:  Head CT 04/05/2020 FINDINGS: CT HEAD FINDINGS Brain: Redemonstrated ischemic infarction changes within the right thalamus and thalamocapsular junction, possibly subacute. Redemonstrated age-indeterminate lacunar infarcts within the basal ganglia bilaterally. Unchanged chronic lacunar infarct within the left thalamus and paramedian posterior right pons. Mild background ill-defined hypoattenuation within cerebral white matter is advanced for age and nonspecific, but consistent with chronic small vessel ischemic disease. There is no acute intracranial hemorrhage. No demarcated cortical infarct is identified. No extra-axial fluid collection. No evidence of intracranial mass. No midline shift. Vascular: Reported below. Skull: Normal. Negative for fracture or focal lesion. Sinuses: No significant paranasal sinus disease or mastoid effusion Orbits: No acute orbital abnormality identified. Review of the MIP images confirms the above findings CTA NECK FINDINGS Aortic arch: Standard aortic branching. Minimal calcified plaque within the visualized aortic arch and proximal major branch vessels of the neck. No hemodynamically significant innominate or proximal subclavian artery stenosis. Right  carotid system: CCA and ICA patent within the neck without significant stenosis (50% or greater). Mild calcified plaque at the carotid bifurcation. Left carotid system: CCA and ICA patent within the neck without significant stenosis (50% or greater). Mild calcified plaque at the carotid bifurcation. Vertebral arteries: Codominant and patent within the neck without significant stenosis. Skeleton: No acute bony abnormality or aggressive osseous lesion. Advanced for age cervical spondylosis with multilevel disc space narrowing, posterior disc osteophytes, uncovertebral and facet hypertrophy. Other neck: No neck mass or cervical lymphadenopathy Upper chest: No consolidation within the imaged lung apices. Review of the MIP images confirms the above findings CTA HEAD FINDINGS Anterior circulation: The intracranial internal carotid arteries are patent. Calcified plaque within both vessels with no more than mild stenosis. The M1 middle cerebral arteries are patent without significant stenosis. No M2 proximal branch occlusion or high-grade proximal stenosis is identified. The anterior cerebral arteries are patent. No intracranial aneurysm is identified. Posterior circulation: The intracranial vertebral arteries are patent. The basilar artery is patent. The posterior cerebral arteries are patent. Posterior communicating arteries are hypoplastic or absent bilaterally. Venous sinuses: Within limitations of contrast timing, no convincing thrombus. Anatomic variants: As described Review of the MIP images confirms the above findings IMPRESSION: CT head: 1. Redemonstrated ischemic infarction changes within the right thalamus and thalamocapsular junction, possibly subacute. Consider brain MRI for further evaluation. 2. Redemonstrated age-indeterminate lacunar infarcts within the basal ganglia bilaterally. 3. 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 4. Mild background cerebral white matter chronic small vessel  ischemic disease, which is advanced for age. CTA neck: 1. The common carotid, internal carotid and vertebral arteries are patent within the neck without hemodynamically significant stenosis. Mild calcified plaque within the visualized aortic arch, proximal major branch vessels of the neck and carotid bifurcations. CTA head: 1. No intracranial large vessel occlusion or proximal high-grade arterial stenosis. 2. Mild calcified plaque within both intracranial internal carotid arteries with no more than mild stenosis. Electronically Signed   By: Kellie Simmering DO   On: 04/06/2020 13:18   CT Code Stroke CTA Neck W/WO contrast  Result Date: 04/06/2020 CLINICAL DATA:  Neuro deficit, acute, stroke suspected. Additional history provided: Patient presents with weakness to left side for 1 week, worsening, left arm with weak grip strength, drift to the left. EXAM: CT ANGIOGRAPHY HEAD AND NECK TECHNIQUE: Multidetector CT imaging of the head and neck was performed using the standard protocol during bolus administration of intravenous contrast. Multiplanar CT image reconstructions and MIPs were obtained to evaluate the vascular anatomy. Carotid stenosis measurements (when applicable) are obtained utilizing NASCET criteria, using the distal internal carotid diameter as the denominator. CONTRAST:  52mL OMNIPAQUE IOHEXOL 350 MG/ML SOLN COMPARISON:  Head CT 04/05/2020 FINDINGS: CT HEAD FINDINGS Brain: Redemonstrated ischemic infarction changes within the right thalamus and thalamocapsular junction, possibly subacute. Redemonstrated age-indeterminate lacunar infarcts within the basal ganglia bilaterally. Unchanged chronic lacunar infarct within the left thalamus and paramedian posterior right pons. Mild background ill-defined hypoattenuation within cerebral white matter is advanced for age and nonspecific, but consistent with chronic small vessel ischemic disease. There is no acute intracranial hemorrhage. No demarcated cortical  infarct is identified. No extra-axial fluid collection. No evidence of intracranial mass. No midline shift. Vascular: Reported below. Skull: Normal. Negative for fracture or focal lesion. Sinuses: No significant paranasal sinus disease or mastoid effusion Orbits: No acute orbital abnormality identified. Review of the MIP images confirms the above findings CTA NECK FINDINGS Aortic arch: Standard aortic branching. Minimal calcified plaque within the visualized aortic arch and proximal major branch vessels of the neck. No hemodynamically significant innominate or proximal subclavian artery stenosis. Right carotid system: CCA and ICA patent within the neck without significant stenosis (50% or greater). Mild calcified plaque at the carotid bifurcation. Left carotid system: CCA and ICA patent within the neck without significant stenosis (50% or greater). Mild calcified plaque at the carotid bifurcation. Vertebral arteries: Codominant and patent within the neck without significant stenosis. Skeleton: No acute bony abnormality or aggressive osseous lesion. Advanced for age cervical spondylosis with multilevel disc space narrowing, posterior disc osteophytes, uncovertebral and facet hypertrophy. Other neck: No neck mass or cervical lymphadenopathy Upper chest: No consolidation within the imaged lung apices. Review of the MIP images confirms the above findings CTA HEAD FINDINGS Anterior circulation: The intracranial internal carotid arteries are patent. Calcified plaque within both vessels with no more than mild stenosis. The M1 middle cerebral arteries are patent without significant stenosis. No M2 proximal branch occlusion or high-grade proximal stenosis is identified. The anterior cerebral arteries are patent. No intracranial aneurysm is identified. Posterior circulation: The intracranial vertebral arteries are patent. The basilar artery is patent. The posterior cerebral arteries are patent. Posterior communicating  arteries are hypoplastic or absent bilaterally. Venous sinuses: Within limitations of contrast timing, no convincing thrombus. Anatomic variants: As described Review of the MIP images confirms the above findings IMPRESSION: CT head: 1. Redemonstrated ischemic infarction changes within the right thalamus and thalamocapsular junction, possibly subacute. Consider brain MRI for further evaluation. 2. Redemonstrated age-indeterminate lacunar infarcts within the basal ganglia bilaterally. 3. 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 4. Mild background cerebral white matter chronic small vessel ischemic disease, which is advanced for age. CTA neck: 1. The common carotid, internal carotid and vertebral arteries are patent within the neck without hemodynamically significant stenosis. Mild calcified plaque within the visualized aortic arch, proximal major branch vessels of the neck and carotid bifurcations. CTA head: 1. No intracranial large vessel occlusion or proximal high-grade arterial stenosis. 2. Mild calcified plaque within both intracranial internal carotid arteries with no more than mild stenosis. Electronically Signed  By: Kellie Simmering DO   On: 04/06/2020 13:18   MR BRAIN WO CONTRAST  Result Date: 04/08/2020 CLINICAL DATA:  Initial evaluation for acute left-sided weakness for 1 week. EXAM: MRI HEAD WITHOUT CONTRAST TECHNIQUE: Multiplanar, multiecho pulse sequences of the brain and surrounding structures were obtained without intravenous contrast. COMPARISON:  Prior CTs from 04/05/2020 and 04/06/2020. FINDINGS: Brain: Examination is technically limited as the patient was unable to tolerate the exam. Axial and coronal DWI sequences only were performed. 1 cm focus of mild diffusion abnormality at the posterior limb of the right internal capsule. Associated ADC signal has likely resolved at this time. Given patient's symptoms, this most likely reflects a subacute ischemic infarct (series 5,  image 81). No definite surrounding edema or mass effect. No secondary signs to suggest associated hemorrhage. No other evidence for acute or subacute ischemia. No other definite acute abnormality seen on this motion degraded exam. No visible encephalomalacia to suggest chronic cortical infarction elsewhere. Suspected scattered chronic micro hemorrhages clustered about the cerebellum, brainstem, and deep gray nuclei, suggesting chronic poorly controlled hypertension, incompletely assessed on this limited exam. No visible mass lesion or mass effect. No midline shift or hydrocephalus. No extra-axial fluid collection. Vascular: Not assessed on this exam. Skull and upper cervical spine: Not assessed on this exam. Sinuses/Orbits: Not assessed on this exam. Other: None. IMPRESSION: 1. Technically limited exam due to the patient's inability to tolerate the study. DWI sequences only were performed. 2. 1 cm focus of mild diffusion abnormality at the posterior limb of the right internal capsule, consistent with a small subacute ischemic infarct. 3. No other definite acute intracranial abnormality seen on this limited exam. 4. Suspected scattered chronic micro hemorrhages clustered about the cerebellum, brainstem, and deep gray nuclei, suggesting chronic poorly controlled hypertension, incompletely assessed on this exam. Electronically Signed   By: Jeannine Boga M.D.   On: 04/08/2020 03:27   ECHOCARDIOGRAM COMPLETE BUBBLE STUDY  Result Date: 04/06/2020    ECHOCARDIOGRAM REPORT   Patient Name:   ALEXIAH KOROMA Date of Exam: 04/06/2020 Medical Rec #:  [id number]          Height:       70.0 in Accession #:    [id number]         Weight:       137.0 lb Date of Birth:  24-Jan-1971           BSA:          1.777 m Patient Age:    33 years           BP:           175/109 mmHg Patient Gender: F                  HR:           69 bpm. Exam Location:  Inpatient Procedure: 2D Echo, Cardiac Doppler, Color Doppler and Saline  Contrast Bubble            Study Indications:    Stroke  History:        Patient has no prior history of Echocardiogram examinations.                 Stroke; Risk Factors:Current Smoker and Hypertension.  Sonographer:    Roseanna Rainbow RDCS Referring Phys: [place]  1. Left ventricular ejection fraction, by estimation, is 65 to 70%. The left ventricle has hyperdynamic function. The left ventricle has no  regional wall motion abnormalities. There is moderate left ventricular hypertrophy. Left ventricular diastolic parameters are consistent with Grade I diastolic dysfunction (impaired relaxation). There was a left ventricular mid-cavity gradient peak 20 mmHg.  2. Right ventricular systolic function is normal. The right ventricular size is normal. Tricuspid regurgitation signal is inadequate for assessing PA pressure.  3. The aortic valve is tricuspid. Aortic valve regurgitation is not visualized. No aortic stenosis is present.  4. The mitral valve is normal in structure. No evidence of mitral valve regurgitation. No evidence of mitral stenosis.  5. The inferior vena cava is normal in size with greater than 50% respiratory variability, suggesting right atrial pressure of 3 mmHg. FINDINGS  Left Ventricle: Left ventricular ejection fraction, by estimation, is 65 to 70%. The left ventricle has hyperdynamic function. The left ventricle has no regional wall motion abnormalities. The left ventricular internal cavity size was normal in size. There is moderate left ventricular hypertrophy. Left ventricular diastolic parameters are consistent with Grade I diastolic dysfunction (impaired relaxation). Right Ventricle: The right ventricular size is normal. No increase in right ventricular wall thickness. Right ventricular systolic function is normal. Tricuspid regurgitation signal is inadequate for assessing PA pressure. Left Atrium: Left atrial size was normal in size. Right Atrium: Right atrial size was  normal in size. Pericardium: There is no evidence of pericardial effusion. Mitral Valve: The mitral valve is normal in structure. Mild mitral annular calcification. No evidence of mitral valve regurgitation. No evidence of mitral valve stenosis. Tricuspid Valve: The tricuspid valve is normal in structure. Tricuspid valve regurgitation is not demonstrated. Aortic Valve: The aortic valve is tricuspid. Aortic valve regurgitation is not visualized. No aortic stenosis is present. Pulmonic Valve: The pulmonic valve was normal in structure. Pulmonic valve regurgitation is not visualized. Aorta: The aortic root is normal in size and structure. Venous: The inferior vena cava is normal in size with greater than 50% respiratory variability, suggesting right atrial pressure of 3 mmHg. IAS/Shunts: No atrial level shunt detected by color flow Doppler. Agitated saline contrast was given intravenously to evaluate for intracardiac shunting.  LEFT VENTRICLE PLAX 2D LVIDd:         3.30 cm     Diastology LVIDs:         2.10 cm     LV e' lateral:   5.66 cm/s LV PW:         1.50 cm     LV E/e' lateral: 9.8 LV IVS:        1.50 cm     LV e' medial:    4.46 cm/s LVOT diam:     1.90 cm     LV E/e' medial:  12.4 LV SV:         63 LV SV Index:   35 LVOT Area:     2.84 cm  LV Volumes (MOD) LV vol d, MOD A2C: 70.7 ml LV vol d, MOD A4C: 57.7 ml LV vol s, MOD A2C: 14.8 ml LV vol s, MOD A4C: 12.1 ml LV SV MOD A2C:     55.9 ml LV SV MOD A4C:     57.7 ml LV SV MOD BP:      51.1 ml RIGHT VENTRICLE             IVC RV S prime:     13.30 cm/s  IVC diam: 1.80 cm TAPSE (M-mode): 2.5 cm LEFT ATRIUM             Index  RIGHT ATRIUM           Index LA diam:        2.90 cm 1.63 cm/m  RA Area:     10.10 cm LA Vol (A2C):   43.8 ml 24.64 ml/m RA Volume:   20.20 ml  11.37 ml/m LA Vol (A4C):   37.8 ml 21.27 ml/m LA Biplane Vol: 41.0 ml 23.07 ml/m  AORTIC VALVE LVOT Vmax:   112.00 cm/s LVOT Vmean:  74.500 cm/s LVOT VTI:    0.221 m  AORTA Ao Root diam:  2.70 cm Ao Asc diam:  3.20 cm MITRAL VALVE MV Area (PHT): 1.86 cm    SHUNTS MV Decel Time: 408 msec    Systemic VTI:  0.22 m MV E velocity: 55.20 cm/s  Systemic Diam: 1.90 cm MV A velocity: 79.20 cm/s MV E/A ratio:  0.70 Loralie Champagne MD Electronically signed by Loralie Champagne MD Signature Date/Time: 04/06/2020/7:27:36 PM    Final    Medical Problem List and Plan: 1.  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  Antithrombotics: -DVT/anticoagulation: Lovenox  -antiplatelet therapy: Aspirin 81 mg daily and Plavix 75 mg daily x21 days then aspirin alone 3. Pain Management: Tylenol as needed 4. Mood: Provide emotional support  -antipsychotic agents: N/A 5. Neuropsych: This patient is capable of making decisions on her own behalf. 6. Skin/Wound Care: Routine skin checks 7. Fluids/Electrolytes/Nutrition: Routine in and outs.  CMP ordered. 8.  Hypertension.  Norvasc 10 mg daily  Monitor the increased mobility. 9.  Hyperlipidemia.  Lipitor 10.  Tobacco/alcohol and polysubstance abuse.  Urine drug screen positive cocaine.  Counsel 11.  Prediabetes.  Hemoglobin A1c 6.3.  Follow-up outpatient.  Monitor increase mobility.  Lavon Paganini Angiulli, PA-C 04/08/2020  I have personally performed a face to face diagnostic evaluation, including, but not limited to relevant history and physical exam findings, of this patient and developed relevant assessment and plan.  Additionally, I have reviewed and concur with the physician assistant's documentation above.  Delice Lesch, MD, ABPMR  The patient's status has not changed. The original post admission physician evaluation remains appropriate, and any changes from the pre-admission screening or documentation from the acute chart are noted above.   Delice Lesch, MD, ABPMR

## 2020-04-08 NOTE — PMR Pre-admission (Addendum)
PMR Admission Coordinator Pre-Admission Assessment  Patient: Roberta Calderon is an 49 y.o., female MRN: [id number] DOB: [birth year] Height: 5\' 10"  (177.8 cm) Weight: 59.1 kg              Insurance Information HMO:     PPO:      PCP:      IPA:      80/20:      OTHER:  PRIMARY: Uninsured      Policy#:       Subscriber:  CM Name:       Phone#:      Fax#:  Pre-Cert#:       Employer:  Benefits:  Phone #:      Name:  Eff. Date:      Deduct:       Out of Pocket Max:       Life Max:   CIR:       SNF:  Outpatient:      Co-Pay:  Home Health:       Co-Pay:  DME:      Co-Pay:  Providers:  SECONDARY:       Policy#:       Phone#:   Development worker, community:       Phone#:   The Therapist, art Information Summary" for patients in Inpatient Rehabilitation Facilities with attached "Privacy Act [place] Records" was provided and verbally reviewed with: N/A  Emergency Contact Information Contact Information    Name Relation Home Work Mobile   [place] Mother [place], [place] Daughter   [phone]     Current Medical History  Patient Admitting Diagnosis: CVA  History of Present Illness: Roberta Calderon is a 49 year old right-handed female with history of hypertension and tobacco/alcohol abuse who presented 04/06/2020 with left-sided weakness x1 week.  Cranial CT scan showed scattered areas of decreased attenuation on the right consistent with a subacute ischemia.  Patient did not receive TPA.  CTA of head and neck no intracranial large vessel occlusion or proximal high-grade stenosis.  MRI completed technically limited exam due to patient's inability to tolerate the study showed a 1 cm focus of mild diffusion abnormality of the posterior limb of the right internal capsule consistent with a small subacute infarction.  Suspected scattered chronic microhemorrhages clustered about the cerebellum, brainstem and deep gray nuclei.  Echocardiogram with ejection fraction  of 70%.  Grade 1 diastolic dysfunction.  Admission chemistries urine drug screen positive cocaine and benzos, glucose 108, hemoglobin A1c 6.3, hemoglobin 15.2, TSH 0.887, SARS coronavirus negative.  Currently maintained on aspirin and Plavix for CVA prophylaxis x3 weeks days and then aspirin alone.  Subcutaneous Lovenox for DVT prophylaxis.  Tolerating a regular diet.  Therapy evaluations completed and patient was recommended for a comprehensive rehab program.    Complete NIHSS TOTAL: 4   Past Medical History  Past Medical History:  Diagnosis Date  . Hypertension     Family History  family history is not on file.  Prior Rehab/Hospitalizations:  Has the patient had prior rehab or hospitalizations prior to admission? No  Has the patient had major surgery during 100 days prior to admission? No  Current Medications   Current Facility-Administered Medications:  .  acetaminophen (TYLENOL) tablet 650 mg, 650 mg, Oral, Q4H PRN, 650 mg at 04/06/20 2025 **OR** acetaminophen (TYLENOL) 160 MG/5ML solution 650 mg, 650 mg, Per Tube, Q4H PRN **OR** acetaminophen (TYLENOL) suppository 650 mg, 650 mg, Rectal, Q4H PRN, Jean Rosenthal, MD .  amLODipine (NORVASC) tablet 10 mg, 10 mg, Oral, Daily, Heard, Courtney S, NP, 10 mg at 04/08/20 0943 .  atorvastatin (LIPITOR) tablet 80 mg, 80 mg, Oral, Daily, Agyei, Obed K, MD, 80 mg at 04/08/20 0945 .  clopidogrel (PLAVIX) tablet 75 mg, 75 mg, Oral, Daily, Rosalin Hawking, MD, 75 mg at 04/08/20 0943 .  enoxaparin (LOVENOX) injection 40 mg, 40 mg, Subcutaneous, Q24H, Agyei, Obed K, MD, 40 mg at 04/07/20 1809 .  labetalol (NORMODYNE) injection 5 mg, 5 mg, Intravenous, Q4H PRN, Heard, Courtney S, NP .  LORazepam (ATIVAN) injection 0-4 mg, 0-4 mg, Intravenous, Q12H **OR** LORazepam (ATIVAN) tablet 0-4 mg, 0-4 mg, Oral, Q12H, Paulla Dolly, MD .  nicotine (NICODERM CQ - dosed in mg/24 hr) patch 7 mg, 7 mg, Transdermal, Daily, Agyei, Obed K, MD, 7 mg at 04/08/20 0947 .   senna-docusate (Senokot-S) tablet 1 tablet, 1 tablet, Oral, QHS PRN, Agyei, Obed K, MD .  sodium chloride flush (NS) 0.9 % injection 3 mL, 3 mL, Intravenous, Once, Carmin Muskrat, MD .  thiamine tablet 100 mg, 100 mg, Oral, Daily, 100 mg at 04/08/20 0943 **OR** thiamine (B-1) injection 100 mg, 100 mg, Intravenous, Daily, Paulla Dolly, MD  Patients Current Diet:  Diet Order            Diet Heart Room service appropriate? Yes; Fluid consistency: Thin  Diet effective now                 Precautions / Restrictions Precautions Precautions: Fall Restrictions Weight Bearing Restrictions: No   Has the patient had 2 or more falls or a fall with injury in the past year?Yes  Prior Activity Level Community (5-7x/wk): working as a Scientist, water quality, driving, no DME used prior to admit  Prior Functional Level Prior Function Level of Independence: Independent  Self Care: Did the patient need help bathing, dressing, using the toilet or eating?  Independent  Indoor Mobility: Did the patient need assistance with walking from room to room (with or without device)? Independent  Stairs: Did the patient need assistance with internal or external stairs (with or without device)? Independent  Functional Cognition: Did the patient need help planning regular tasks such as shopping or remembering to take medications? Independent  Home Assistive Devices / Equipment Home Assistive Devices/Equipment: None  Prior Device Use: Indicate devices/aids used by the patient prior to current illness, exacerbation or injury? None of the above  Current Functional Level Cognition  Arousal/Alertness: Awake/alert Overall Cognitive Status: Impaired/Different from baseline Current Attention Level: Sustained Orientation Level: Oriented X4 Safety/Judgement: Decreased awareness of deficits General Comments: not as aware of L side needing cues for keeping hand oriented correctly on walker and A for keeping walker  straight Attention: Focused, Sustained, Selective Focused Attention: Appears intact Sustained Attention: Impaired Sustained Attention Impairment: Verbal basic Selective Attention: Impaired Selective Attention Impairment: Verbal basic, Functional basic Memory: Impaired (recalled 2/5 words, did not recognize the other 3 given multiple choice) Behaviors: Poor frustration tolerance Comments: 17/30 SLUMS    Extremity Assessment (includes Sensation/Coordination)  Upper Extremity Assessment: LUE deficits/detail LUE Deficits / Details: grossly 3/5 MMT, decreased coordination and sensation  LUE Sensation: decreased light touch LUE Coordination: decreased fine motor, decreased gross motor  Lower Extremity Assessment: Defer to PT evaluation LLE Deficits / Details: Pt reporting pain, numbness and weakness. Able to move through full AROM. Pt not agreeable to MMT. LLE Sensation: decreased proprioception LLE Coordination: decreased gross motor    ADLs  Overall ADL's : Needs assistance/impaired  Grooming: Min guard, Standing Grooming Details (indicate cue type and reason): increased time to manage items with L hand, min guard standing  Upper Body Bathing: Set up, Sitting Lower Body Bathing: Sit to/from stand, Minimal assistance Upper Body Dressing : Minimal assistance, Sitting Lower Body Dressing: Minimal assistance, Sit to/from stand Toilet Transfer: Minimal assistance, Ambulation Toilet Transfer Details (indicate cue type and reason): simulated in room Functional mobility during ADLs: Minimal assistance, Moderate assistance, Cueing for safety General ADL Comments: pt limited by L sided weakness, impaired balance and decreased safety awareness     Mobility  Overal bed mobility: Needs Assistance Bed Mobility: Supine to Sit, Sit to Supine Supine to sit: Supervision, HOB elevated Sit to supine: Min guard General bed mobility comments: A for getting L LE fully onto bed when going to supine     Transfers  Overall transfer level: Needs assistance Equipment used: 1 person hand held assist, Rolling walker (2 wheeled), Straight cane Transfers: Sit to/from Stand Sit to Stand: Min assist General transfer comment: A for balance, safety    Ambulation / Gait / Stairs / Wheelchair Mobility  Ambulation/Gait Ambulation/Gait assistance: Min assist, Mod assist Gait Distance (Feet): 100 Feet (&150') Assistive device: Rolling walker (2 wheeled), 1 person hand held assist, Straight cane Gait Pattern/deviations: Step-through pattern, Step-to pattern, Decreased dorsiflexion - left, Decreased step length - left General Gait Details: L foot clearance fair throughout though slower and decreased with fatigue, initially with 1 HHA, then used RW (too wide and tall, but pt requesting to use), then practiced few feet with SPC in R hand and min to mod A for balance, then to/from bathroom end of session with HHA Gait velocity: decreased Gait velocity interpretation: 1.31 - 2.62 ft/sec, indicative of limited community ambulator Stairs: Yes Stairs assistance: Min assist Stair Management: Step to pattern, Sideways, Two rails Number of Stairs: 2 General stair comments: practiced for strenthening and technique leading with R up and L down    Posture / Balance Balance Overall balance assessment: Needs assistance Sitting-balance support: No upper extremity supported, Feet supported Sitting balance-Leahy Scale: Good Standing balance support: Single extremity supported, During functional activity Standing balance-Leahy Scale: Poor Standing balance comment: toileting and stood on her own using railing in bathroom and holding door frame    Special needs/care consideration n/a     Previous Home Environment (from acute therapy documentation) Type of Home: [place]: No  Discharge Living Setting Plans for Discharge Living Setting: Alone, Lives with (comment) (brother) Type of Home at  Discharge: House Discharge Home Layout: Two level, Able to live on main level with bedroom/bathroom Discharge Home Access: Stairs to enter Entrance Stairs-Rails: None Entrance Stairs-Number of Steps: 2-3 Discharge Bathroom Shower/Tub: Tub/shower unit Discharge Bathroom Toilet: Standard Discharge Bathroom Accessibility: Yes How Accessible: Accessible via walker Does the patient have any problems obtaining your medications?: Yes (Describe) (uninsured)  Social/Family/Support Systems Anticipated Caregiver: brother Merry Proud) and Mother Albin Felling) Anticipated Caregiver's Contact Information: Albin Felling [phone]) Ability/Limitations of Caregiver: Albin Felling works but confirms that pt has discussed with brother 24/7 Caregiver Availability: 24/7 Discharge Plan Discussed with Primary Caregiver: Yes Is Caregiver In Agreement with Plan?: Yes Does Caregiver/Family have Issues with Lodging/Transportation while Pt is in Rehab?: No   Goals Patient/Family Goal for Rehab: PT/OT supervision to mod I Expected length of stay: 14-16 days Additional Information: pt states she is not homeless Pt/Family Agrees to Admission and willing to participate: Yes Program Orientation Provided & Reviewed with Pt/Caregiver Including Roles  & Responsibilities: Yes  Barriers to Discharge: Insurance for SNF coverage, Decreased caregiver support   Decrease burden of Care through IP rehab admission: n/a  Possible need for SNF placement upon discharge: not anticipated  Patient Condition: This patient's condition remains as documented in the consult dated 04/07/2020, in which the Rehabilitation Physician determined and documented that the patient's condition is appropriate for intensive rehabilitative care in an inpatient rehabilitation facility. Will admit to inpatient rehab today.  Preadmission Screen Completed By:  Michel Santee, PT, DPT 04/08/2020 1:36  PM ______________________________________________________________________   Discussed status with Dr. Posey Pronto on 04/08/20 at 1:39 PM and received approval for admission today.  Admission Coordinator:  Michel Santee, PT, DPT time 1:39 PM Sudie Grumbling 04/08/20

## 2020-04-08 NOTE — Progress Notes (Signed)
Patient arrived at approximatrly 1700 with staff from previous unit in w/c. Patient transferred to bed in room with minimal assistance. Alert and oriented with no c/o or s/s of distress noted at time of arrival.

## 2020-04-08 NOTE — Progress Notes (Addendum)
Subjective:   Yesterday, patient agreed to do the MRI brain however, it looks like she was unable to complete it. This AM, she states she feels the same and her numbness and weakness has remained the same. She was informed of the plans to have her admitted to inpatient rehab today.   Of note, patient's nurse team patient inform team that patient refused her aspirin because it makes her itch. Patient was taken off aspirin.    Objective:  Vital signs in last 24 hours: Vitals:   04/07/20 1613 04/07/20 1944 04/08/20 0012 04/08/20 0341  BP: (!) 178/97 (!) 162/79 (!) 170/79 (!) 164/103  Pulse: 94 98 85 79  Resp: 18 19 19 17   Temp: 98.1 F (36.7 C) 97.7 F (36.5 C) 98.3 F (36.8 C) 98 F (36.7 C)  TempSrc: Oral Oral Oral Oral  SpO2: 100% 99% 97% 100%  Weight:  59.1 kg    Height:  5\' 10"  (1.778 m)     Physical Exam: Constitutional: Middle-aged woman laying in bed on her phone. No acute distress.  HENT: [place]/AT Eyes: No sclera icterus Neck: Supple. Normal ROM Cardiovascular: RRR. No m/r/g. No LE edema.  Pulmonary/Chest: Lungs CTAB. Normal effort. No wheezes or crackles Abdominal: Soft, non-tender, non-distended. Normal bowel sounds. MSK: Normal ROM Neuro: A&Ox3. Moves all extremities. Strength 5/5 on RUE and RLE, 3/5 on LUE, 4/5 on LLE. Decreased senstation on LUE and LLE.  Skin: No obvious rashes or lesions Psych: Normal affect and mood   Assessment/Plan:  Principal Problem:   Ischemic stroke [facility]) Active Problems:   Hypertension   Tobacco use disorder  Ms. Allmendinger is a 49 year old woman w/ PMH of uncontrolled HTN, alcohol and tobacco use disorder who presented with 1 week of left-sided weakness and found to have subacute ischemia involving the right thalamus, right basal ganglia and right deep white matter on CT head. Getting admitted to Inpatient Rehab  Acute CVA involving R thalamus, R basal ganglia and R deep white matter Presented with 1 wk of left-sided weakness  and paresthesias. CT head shows scattered areas of decreased attenuation on the right involving the R thalamus, R basal ganglia and R deep white matter in the parietal lobe consistent with acute to subacute ischemia. CTA neck and head show mild calcified plaque within both intracranial internal carotid arteries with no more than mild stenosis. ECHO shows EF of 65-70% with grade I diastolic dysfunction. The most likely etiology for her stroke is uncontrolled hypertension and continued tobacco and alcohol use. Unable to complete MRI brain yesterday. CIR admission today. Patient refused aspirin due to itching when she takes it.  --Neurology signed off --Neuro recommended ASA 81 mg and Plavix 75 mg for 3 weeks, then ASA alone --ASA discontinued due to reaction (itching) --Continue Lipitor 80 mg daily --LDL 173, A1c 6.3. --Normal HIV, RPR -- Echo EF 78-29%, grade 1 diastolic dysfunction -- CTA head & neck unremarkable -- MRI brain consistent with a small subacute, but limited exam ischemic infarct. --Admitted to CIR  Polycythemia Presents with thrombotic events and found to have elevated hemoglobin of 15.2 with repeat of 17.3, improved to 13.6 on 04/06/20 --Normal erythropoietin level --Follow-up JAK2 mutation level (doubt PCV based on resolution)  Hypertension-uncontrolled Takes hydrochlorothiazide 25 mg daily though unsure about compliance. BP elevated to allow for brain perfusion -Allow for permissive hypertension  Polysubstance use Patient with history of substance use including extensive history of alcohol. UDS positive for cocaine and benzos. Hemodynamically stable and no signs  of withdraw. --CIWA protocol with ativan --Daily vitals  Tobacco use disorder History of smoking 1 ppd for years and now down to 1/2 ppd. Interested in smoking cessation resources. --Continue NicoDerm   Prior to Admission Living Arrangement: Home Anticipated Discharge Location: Inpatient rehab Barriers to  Discharge: None Dispo: Anticipated discharge today to CIR  Lacinda Axon, MD 04/08/2020, 6:00 AM Pager: [phone] Internal Medicine Teaching Service After 5pm on weekdays and 1pm on weekends: On Call pager [phone]

## 2020-04-08 NOTE — Progress Notes (Signed)
Physical Therapy Treatment Patient Details Name: Roberta Calderon MRN: [id number] DOB: Jan 17, 1971 Today's Date: 04/08/2020    History of Present Illness 49 year old woman with medical history significant for hypertension-uncontrolled, tobacco use disorder who presented with left-sided weakness.  Positive for right thalamus, right basal ganglia and right deep white matter in the parietal lobe consistent with acute to subacute ischemia.    PT Comments    Patient progressing with ambulation and practicing with different devices.  She continues to demonstrate decreased L side awareness and L U/LE weakness.  She will continue to benefit from skilled PT in the acute setting and from follow up CIR level rehab at d/c.   Follow Up Recommendations  CIR     Equipment Recommendations  Other (comment) (TBA)    Recommendations for Other Services       Precautions / Restrictions Precautions Precautions: Fall Restrictions Weight Bearing Restrictions: No    Mobility  Bed Mobility Overal bed mobility: Needs Assistance Bed Mobility: Supine to Sit;Sit to Supine     Supine to sit: Supervision;HOB elevated Sit to supine: Min guard   General bed mobility comments: A for getting L LE fully onto bed when going to supine  Transfers Overall transfer level: Needs assistance Equipment used: 1 person hand held assist;Rolling walker (2 wheeled);Straight cane Transfers: Sit to/from Stand Sit to Stand: Min assist         General transfer comment: A for balance, safety  Ambulation/Gait Ambulation/Gait assistance: Min assist;Mod assist Gait Distance (Feet): 100 Feet (&150') Assistive device: Rolling walker (2 wheeled);1 person hand held assist;Straight cane Gait Pattern/deviations: Step-through pattern;Step-to pattern;Decreased dorsiflexion - left;Decreased step length - left     General Gait Details: L foot clearance fair throughout though slower and decreased with fatigue, initially with 1  HHA, then used RW (too wide and tall, but pt requesting to use), then practiced few feet with SPC in R hand and min to mod A for balance, then to/from bathroom end of session with HHA   Stairs Stairs: Yes Stairs assistance: Min assist Stair Management: Step to pattern;Sideways;Two rails Number of Stairs: 2 General stair comments: practiced for strenthening and technique leading with R up and L down   Wheelchair Mobility    Modified Rankin (Stroke Patients Only) Modified Rankin (Stroke Patients Only) Pre-Morbid Rankin Score: No symptoms Modified Rankin: Moderately severe disability     Balance Overall balance assessment: Needs assistance   Sitting balance-Leahy Scale: Good     Standing balance support: Single extremity supported;During functional activity Standing balance-Leahy Scale: Poor Standing balance comment: toileting and stood on her own using railing in bathroom and holding door frame                            Cognition Arousal/Alertness: Awake/alert Behavior During Therapy: Flat affect Overall Cognitive Status: Impaired/Different from baseline Area of Impairment: Attention;Safety/judgement                   Current Attention Level: Sustained     Safety/Judgement: Decreased awareness of deficits     General Comments: not as aware of L side needing cues for keeping hand oriented correctly on walker and A for keeping walker straight      Exercises Other Exercises Other Exercises: step ups onto 6" step L first x 5 Other Exercises: step taps to 6" step x 10 with 1 to 2 UE support and min A    General Comments General comments (  skin integrity, edema, etc.): called family member during session asking them to bring her keys before going to work; then later speaking with Rehab MD acknowledged understanding she would be going to rehab today.      Pertinent Vitals/Pain Pain Assessment: No/denies pain    Home Living                       Prior Function            PT Goals (current goals can now be found in the care plan section) Progress towards PT goals: Progressing toward goals    Frequency    Min 4X/week      PT Plan Current plan remains appropriate    Co-evaluation              AM-PAC PT "6 Clicks" Mobility   Outcome Measure  Help needed turning from your back to your side while in a flat bed without using bedrails?: None Help needed moving from lying on your back to sitting on the side of a flat bed without using bedrails?: None Help needed moving to and from a bed to a chair (including a wheelchair)?: A Little Help needed standing up from a chair using your arms (e.g., wheelchair or bedside chair)?: A Little Help needed to walk in hospital room?: A Little Help needed climbing 3-5 steps with a railing? : A Little 6 Click Score: 20    End of Session   Activity Tolerance: Patient tolerated treatment well Patient left: in bed;with call bell/phone within reach;with bed alarm set   PT Visit Diagnosis: Other abnormalities of gait and mobility (R26.89);Other symptoms and signs involving the nervous system (R29.898)     Time: 2536-6440 PT Time Calculation (min) (ACUTE ONLY): 23 min  Charges:  $Gait Training: 8-22 mins $Therapeutic Activity: 8-22 mins                     Magda Kiel, PT Acute Rehabilitation Services [phone] Office:[phone] 04/08/2020    Reginia Naas 04/08/2020, 1:04 PM

## 2020-04-08 NOTE — TOC CAGE-AID Note (Signed)
Transition of Care [facility]) - CAGE-AID Screening   Patient Details  Name: Roberta Calderon MRN: [id number] Date of Birth: [birth year]  Transition of Care [facility]) CM/SW Contact:    Emeterio Reeve, [place] Phone Number: 04/08/2020, 11:03 AM   Clinical Narrative: CSW met with pt at bedside. CSW introduced self and explained her role at the hospital.  Pt reports daily alcohol use. Pt was unable to give an amount. Pt denies benzo use. Pt reports she did cocaine one time and states shes is "done with it".   Pt was receptive to counseling. Pt declined resources and Scientist, clinical (histocompatibility and immunogenetics).    CAGE-AID Screening:    Have You Ever Felt You Ought to Cut Down on Your Drinking or Drug Use?: No Have People Annoyed You By Critizing Your Drinking Or Drug Use?: No Have You Felt Bad Or Guilty About Your Drinking Or Drug Use?: No Have You Ever Had a Drink or Used Drugs First Thing In The Morning to Steady Your Nerves or to Get Rid of a Hangover?: No CAGE-AID Score: 0  Substance Abuse Education Offered: Yes  Substance abuse interventions: Patient Counseling   Emeterio Reeve, Latanya Presser, [place] Social Worker [phone]

## 2020-04-09 ENCOUNTER — Inpatient Hospital Stay (HOSPITAL_COMMUNITY): Payer: Self-pay | Admitting: Occupational Therapy

## 2020-04-09 ENCOUNTER — Inpatient Hospital Stay (HOSPITAL_COMMUNITY): Payer: Self-pay | Admitting: Physical Therapy

## 2020-04-09 DIAGNOSIS — I639 Cerebral infarction, unspecified: Secondary | ICD-10-CM

## 2020-04-09 MED ORDER — NITROGLYCERIN 0.4 MG SL SUBL
SUBLINGUAL_TABLET | SUBLINGUAL | Status: AC
  Administered 2020-04-09: 0.4 mg
  Filled 2020-04-09: qty 1

## 2020-04-09 MED ORDER — LISINOPRIL 5 MG PO TABS
2.5000 mg | ORAL_TABLET | Freq: Every day | ORAL | Status: DC
  Administered 2020-04-09 – 2020-04-10 (×2): 2.5 mg via ORAL
  Filled 2020-04-09 (×2): qty 1

## 2020-04-09 MED ORDER — NITROGLYCERIN 0.4 MG SL SUBL: 0.4000 mg | SUBLINGUAL_TABLET | SUBLINGUAL | Status: DC | PRN

## 2020-04-09 NOTE — Plan of Care (Signed)
  Problem: RH Balance Goal: LTG Patient will maintain dynamic standing with ADLs (OT) Description: LTG:  Patient will maintain dynamic standing balance with assist during activities of daily living (OT)  Flowsheets (Taken 04/09/2020 1647) LTG: Pt will maintain dynamic standing balance during ADLs with: Independent with assistive device   Problem: Sit to Stand Goal: LTG:  Patient will perform sit to stand in prep for activites of daily living with assistance level (OT) Description: LTG:  Patient will perform sit to stand in prep for activites of daily living with assistance level (OT) Flowsheets (Taken 04/09/2020 1647) LTG: PT will perform sit to stand in prep for activites of daily living with assistance level: Independent with assistive device   Problem: RH Grooming Goal: LTG Patient will perform grooming w/assist,cues/equip (OT) Description: LTG: Patient will perform grooming with assist, with/without cues using equipment (OT) Flowsheets (Taken 04/09/2020 1647) LTG: Pt will perform grooming with assistance level of: Independent with assistive device    Problem: RH Bathing Goal: LTG Patient will bathe all body parts with assist levels (OT) Description: LTG: Patient will bathe all body parts with assist levels (OT) Flowsheets (Taken 04/09/2020 1647) LTG: Pt will perform bathing with assistance level/cueing: Independent with assistive device    Problem: RH Dressing Goal: LTG Patient will perform upper body dressing (OT) Description: LTG Patient will perform upper body dressing with assist, with/without cues (OT). Flowsheets (Taken 04/09/2020 1647) LTG: Pt will perform upper body dressing with assistance level of: Independent with assistive device Goal: LTG Patient will perform lower body dressing w/assist (OT) Description: LTG: Patient will perform lower body dressing with assist, with/without cues in positioning using equipment (OT) Flowsheets (Taken 04/09/2020 1647) LTG: Pt will perform  lower body dressing with assistance level of: Independent with assistive device   Problem: RH Toileting Goal: LTG Patient will perform toileting task (3/3 steps) with assistance level (OT) Description: LTG: Patient will perform toileting task (3/3 steps) with assistance level (OT)  Flowsheets (Taken 04/09/2020 1647) LTG: Pt will perform toileting task (3/3 steps) with assistance level: Independent with assistive device   Problem: RH Toilet Transfers Goal: LTG Patient will perform toilet transfers w/assist (OT) Description: LTG: Patient will perform toilet transfers with assist, with/without cues using equipment (OT) Flowsheets (Taken 04/09/2020 1647) LTG: Pt will perform toilet transfers with assistance level of: Independent with assistive device   Problem: RH Tub/Shower Transfers Goal: LTG Patient will perform tub/shower transfers w/assist (OT) Description: LTG: Patient will perform tub/shower transfers with assist, with/without cues using equipment (OT) Flowsheets (Taken 04/09/2020 1647) LTG: Pt will perform tub/shower stall transfers with assistance level of: Independent with assistive device

## 2020-04-09 NOTE — Evaluation (Signed)
Occupational Therapy Assessment and Plan  Patient Details  Name: Roberta Calderon MRN: [id number] Date of Birth: Sep 30, 1970  OT Diagnosis: hemiplegia affecting non-dominant side and muscle weakness (generalized) Rehab Potential: Rehab Potential (ACUTE ONLY): Excellent ELOS: 7-10 days   Today's Date: 04/09/2020 OT Individual Time: 1300-1410 OT Individual Time Calculation (min): 70 min     Hospital Problem: Principal Problem:   Right thalamic infarction [facility])   Past Medical History:  Past Medical History:  Diagnosis Date   Hypertension    Past Surgical History: No past surgical history on file.  Assessment & Plan Clinical Impression:Roberta Calderon is a 49 year old right-handed female with history of hypertension and tobacco/alcohol abuse who presented 04/06/2020 with left-sided weakness x1 week.  History taken from chart review and patient.  Patient lives alone works at [employer] independent prior to admission.  She has a mother and daughter in the area.  Cranial CT scan showed scattered areas of decreased attenuation on the right consistent with a subacute ischemia.  Patient did not receive TPA.  CTA of head and neck no intracranial large vessel occlusion or proximal high-grade stenosis.  MRI completed technically limited exam due to patient's inability to tolerate the study showed a 1 cm focus of mild diffusion abnormality of the posterior limb of the right internal capsule consistent with a small subacute infarction.  Suspected scattered chronic microhemorrhages clustered about the cerebellum, brainstem and deep gray nuclei.  Echocardiogram with ejection fraction of 70%.  Grade 1 diastolic dysfunction.  Admission chemistries urine drug screen positive cocaine and benzos, glucose 108, hemoglobin A1c 6.3, hemoglobin 15.2, TSH 0.887, SARS coronavirus negative.  Currently maintained on aspirin and Plavix for CVA prophylaxis x3 weeks days and then aspirin alone.  Subcutaneous Lovenox  for DVT prophylaxis.  Tolerating a regular diet.  Therapy evaluations completed and patient was admitted for a comprehensive rehab program.  Please see preadmission assessment as well.    Patient currently requires min with basic self-care skills secondary to muscle weakness, decreased cardiorespiratoy endurance, decreased coordination and decreased standing balance, decreased postural control and hemiplegia.  Prior to hospitalization, patient could complete BADLs with independent .  Patient will benefit from skilled intervention to increase independence with basic self-care skills prior to discharge home with family.  Anticipate patient will require intermittent supervision and follow up home health.  OT - End of Session Endurance Deficit: Yes Endurance Deficit Description: Pt needing to use the RW near end of session to help with energy conservation OT Assessment Rehab Potential (ACUTE ONLY): Excellent OT Barriers to Discharge: Other (comments) OT Barriers to Discharge Comments: Pt asking about transitional housing options, is she really d/c home with mother? Per chart, she was d/c to brother's home OT Patient demonstrates impairments in the following area(s): Balance;Safety;Endurance;Motor OT Basic ADL's Functional Problem(s): Grooming;Bathing;Dressing;Toileting OT Advanced ADL's Functional Problem(s): Light Housekeeping OT Transfers Functional Problem(s): Toilet;Tub/Shower OT Additional Impairment(s): None OT Plan OT Intensity: Minimum of 1-2 x/day, 45 to 90 minutes OT Frequency: 5 out of 7 days OT Duration/Estimated Length of Stay: 7-10 days OT Treatment/Interventions: Balance/vestibular training;Discharge planning;Pain management;Self Care/advanced ADL retraining;Therapeutic Activities;UE/LE Coordination activities;Therapeutic Exercise;Patient/family education;Functional mobility training;Disease mangement/prevention;Community reintegration;DME/adaptive equipment instruction;Neuromuscular  re-education;Psychosocial support;UE/LE Strength taining/ROM OT Self Feeding Anticipated Outcome(s): No goal OT Basic Self-Care Anticipated Outcome(s): Mod I OT Toileting Anticipated Outcome(s): Mod I OT Bathroom Transfers Anticipated Outcome(s): Mod I OT Recommendation Recommendations for Other Services: Neuropsych consult Patient destination: Home Follow Up Recommendations: Home health OT Equipment Recommended: To be determined  OT Evaluation Precautions/Restrictions  Precautions Precautions: Fall Precaution Comments: Mild Lt hemi Vital Signs Therapy Vitals Temp: 98 F (36.7 C) Pulse Rate: 79 Resp: 16 BP: 135/88 Patient Position (if appropriate): Lying Oxygen Therapy SpO2: 100 % O2 Device: Room Air Pain: no c/o pain during tx   Home Living/Prior Functioning Home Living Available Help at Discharge: Family, Available 24 hours/day Type of Home: House Home Access: Stairs to enter Technical brewer of Steps: 1 Home Layout: One level Bathroom Shower/Tub: Government social research officer Accessibility: Yes Additional Comments: Home information provided above reflects pts description of her mother's home  Lives With: Family (per pt, she is d/c home with mother) IADL History Homemaking Responsibilities: Yes (Pt reported being independent with meal prep and cleaning responsibilities PTA) Type of Occupation: 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 Leisure and Hobbies: Spending time with her 5 grandchildren Prior Function Level of Independence: Independent with basic ADLs, Independent with homemaking with ambulation Driving: Yes Vision Baseline Vision/History: Wears glasses Wears Glasses: At all times Patient Visual Report: No change from baseline (pt reports that her glasses are in her car vs at CIR) Perception  Perception: Within Functional Limits Praxis Praxis: Intact Cognition Overall Cognitive Status: Within Functional Limits for tasks  assessed Arousal/Alertness: Awake/alert Orientation Level: Person;Place;Situation Person: Oriented Place: Oriented Situation: Oriented Year: 2021 Month: August Day of Week: Correct Memory: Appears intact Immediate Memory Recall: Sock;Blue;Bed Memory Recall Sock: Without Cue Memory Recall Blue: Without Cue Memory Recall Bed: Without Cue Focused Attention: Appears intact Sustained Attention: Impaired Selective Attention: Appears intact Problem Solving: Appears intact Behaviors: Impulsive Safety/Judgment: Appears intact Comments: mild impulsivity Sensation Sensation Light Touch: Impaired Detail Light Touch Impaired Details: Impaired LUE;Impaired LLE (pt reports numbness/tingling in the affected side) Coordination Gross Motor Movements are Fluid and Coordinated: No Fine Motor Movements are Fluid and Coordinated: No Coordination and Movement Description: Mild Lt hemiplegia Finger Nose Finger Test: Decreased speed on the Lt compared to Rt 9 Hole Peg Test: 25 seconds Rt, 36 seconds Lt Motor  Motor Motor: Hemiplegia  Trunk/Postural Assessment  Cervical Assessment Cervical Assessment: Within Functional Limits Thoracic Assessment Thoracic Assessment: Within Functional Limits Lumbar Assessment Lumbar Assessment: Within Functional Limits Postural Control Postural Control: Deficits on evaluation (mild Lt lateral sway during functional ambulation)  Balance Balance Balance Assessed: Yes Dynamic Sitting Balance Dynamic Sitting - Balance Support: No upper extremity supported;During functional activity (donning Ted hose EOB) Dynamic Sitting - Level of Assistance: 5: Stand by assistance Dynamic Standing Balance Dynamic Standing - Balance Support: No upper extremity supported;During functional activity Dynamic Standing - Level of Assistance: 4: Min assist (ambulating to the toilet without AD) Dynamic Standing - Balance Activities: Lateral lean/weight shifting;Forward lean/weight  shifting Extremity/Trunk Assessment RUE Assessment RUE Assessment: Within Functional Limits LUE Assessment LUE Assessment: Within Functional Limits  Care Tool Care Tool Self Care Eating        Oral Care  Oral care, brush teeth, clean dentures activity did not occur: Refused      Bathing   Body parts bathed by patient: Left arm;Right arm;Chest;Abdomen;Front perineal area;Buttocks;Right upper leg;Left upper leg;Right lower leg;Left lower leg;Face     Assist Level: Contact Guard/Touching assist (simulated)    Upper Body Dressing(including orthotics)   What is the patient wearing?: Bra;Pull over shirt   Assist Level: Set up assist    Lower Body Dressing (excluding footwear) Lower body dressing activity did not occur: Refused        Putting on/Taking off footwear   What is the patient  wearing?: Ted hose;Non-skid slipper socks Assist for footwear: Supervision/Verbal cueing       Care Tool Toileting Toileting activity   Assist for toileting: Contact Guard/Touching assist     Care Tool Bed Mobility Roll left and right activity        Sit to lying activity        Lying to sitting edge of bed activity         Care Tool Transfers Sit to stand transfer        Chair/bed transfer         Toilet transfer   Assist Level: Contact Guard/Touching assist      Refer to Care Plan for [place] 1 OT Short Term Goal 1 (Week 1): STGs=LTGs due to ELOS  Recommendations for other services: Neuropsych   Skilled Therapeutic Intervention Skilled OT session completed with focus on initial evaluation, education on OT role/POC, and establishment of patient-centered goals.   Pt greeted in bed with no c/o pain. Concerned about an itchy rash on her Lt foot, notified RN of her concern. She declined participation in bathing, stated she already completed this before OT arrival. After she donned her Ted hose and gripper socks EOB with cuing for Teds, she  ambulated without device and CGA into the bathroom and transferred to the 3:1 in shower. Pt then participated in simulated bathing tasks at sit<stand level with CGA for dynamic standing. She completed toileting afterwards with CGA for balance as well. While washing her hands at the sink after, pt c/o cleanliness of room, very motivated to engage in IADL tasks such as stripping linen from bed, bedmaking, sweeping, and sanitizing furniture surfaces during session. She engaged in stated tasks while ambulating without device with CGA, fading to close supervision for dynamic balance, vcs for improving Lt foot drag. Pt stooping to floor to retrieve dustpan, dropped linen, and trash with CGA-supervision. No LOBs noted. She ambulated without device and the same assist to the dayroom to look outside of the window, per request, and then to the outdoor patio (using RW at this time for energy conservation). Supervision for ambulation with RW outside. While seated, pt engaged in [address] Peg Test with results recorded in eval, slower to complete on the Lt side. Once pt returned to room she transferred to flat bed without bedrails, able to complete simulated transfer out of bed with setup assistance. Pt asking OT about transitional housing options, discussed deferring this questions to CSW. She was agreeable to 3:1 and TTB for home use, states she is going home with mother for certain, not to brother's home. Pt left in bed with all needs within reach and bed alarm set.   ADL ADL Eating: Not assessed Grooming: Setup Where Assessed-Grooming: Edge of bed Upper Body Bathing: Setup Where Assessed-Upper Body Bathing: Shower Lower Body Bathing: Contact guard Where Assessed-Lower Body Bathing: Shower Upper Body Dressing: Setup Where Assessed-Upper Body Dressing: Edge of bed Lower Body Dressing: Supervision/safety Where Assessed-Lower Body Dressing: Edge of bed Toileting: Contact guard Toilet Transfer: Arts administrator Method: Ambulating (without AD) Science writer: Energy manager: Curator Method: Ambulating (without AD) Youth worker: Other (comment) (3:1) Mobility   CGA ambulatory toilet transfer without AD   Discharge Criteria: Patient will be discharged from OT if patient refuses treatment 3 consecutive times without medical reason, if treatment goals not met, if there is a change in medical status,  if patient makes no progress towards goals or if patient is discharged from hospital.  The above assessment, treatment plan, treatment alternatives and goals were discussed and mutually agreed upon: by patient  Skeet Simmer 04/09/2020, 4:43 PM

## 2020-04-09 NOTE — Significant Event (Addendum)
Rapid Response Event Note   Reason for Call : CP 10/10  Initial Focused Assessment:  Upon arrival, Ms. Betley was complaining of 10/10 substernal CP without radiation. Denies SOB and nausea. Skin is warm pink and dry. EKG shows SR without STE. SL NTG given for CP. PIV attempted unsuccessfully and pt stated she did not want to be stuck again. Initially the pt was hypertensive 144/115. CP was decreasing with SL NTG to 7/10.   2252-HR 73, 155/83 (105), RR 18   Interventions:  -Stat EKG (SR without STE) -SL Ntg 0.4mg  po q 5 mins x 3 until pain is gone   Plan of Care:  -Monitor for further CP  -Notify primary svc of event and further orders -Recheck VS q 30 mins x2 -If pain continues after 3 NTG, recontact primary svc for further orders.    Addendum- Prior to leaving the call, pt got up out of bed independently. 2330- Follow up, pt was resting in bed and stated her pain was almost all gone.   MD Notified: Dr. Ranell Patrick @ 2304 By Wells Guiles RN Call Time: 2241 Arrival Time: 2245 End Time: 2315  Madelynn Done, RN

## 2020-04-09 NOTE — Progress Notes (Signed)
[place] PHYSICAL MEDICINE & REHABILITATION PROGRESS NOTE   Subjective/Complaints: Roberta Calderon PT discussed with me that pressures were very elevated today in therapy. Neurology note reviewed and she is outside of permissive HTN. Will start Lisinopril 2.5mg .  Denies pain.  ROS: Denies pain, constipation, SOB   Objective:   MR BRAIN WO CONTRAST  Result Date: 04/08/2020 CLINICAL DATA:  Initial evaluation for acute left-sided weakness for 1 week. EXAM: MRI HEAD WITHOUT CONTRAST TECHNIQUE: Multiplanar, multiecho pulse sequences of the brain and surrounding structures were obtained without intravenous contrast. COMPARISON:  Prior CTs from 04/05/2020 and 04/06/2020. FINDINGS: Brain: Examination is technically limited as the patient was unable to tolerate the exam. Axial and coronal DWI sequences only were performed. 1 cm focus of mild diffusion abnormality at the posterior limb of the right internal capsule. Associated ADC signal has likely resolved at this time. Given patient's symptoms, this most likely reflects a subacute ischemic infarct (series 5, image 32). No definite surrounding edema or mass effect. No secondary signs to suggest associated hemorrhage. No other evidence for acute or subacute ischemia. No other definite acute abnormality seen on this motion degraded exam. No visible encephalomalacia to suggest chronic cortical infarction elsewhere. Suspected scattered chronic micro hemorrhages clustered about the cerebellum, brainstem, and deep gray nuclei, suggesting chronic poorly controlled hypertension, incompletely assessed on this limited exam. No visible mass lesion or mass effect. No midline shift or hydrocephalus. No extra-axial fluid collection. Vascular: Not assessed on this exam. Skull and upper cervical spine: Not assessed on this exam. Sinuses/Orbits: Not assessed on this exam. Other: None. IMPRESSION: 1. Technically limited exam due to the patient's inability to tolerate the study. DWI  sequences only were performed. 2. 1 cm focus of mild diffusion abnormality at the posterior limb of the right internal capsule, consistent with a small subacute ischemic infarct. 3. No other definite acute intracranial abnormality seen on this limited exam. 4. Suspected scattered chronic micro hemorrhages clustered about the cerebellum, brainstem, and deep gray nuclei, suggesting chronic poorly controlled hypertension, incompletely assessed on this exam. Electronically Signed   By: Jeannine Boga M.D.   On: 04/08/2020 03:27   Recent Labs    04/08/20 1944  WBC 5.7  HGB 14.0  HCT 45.3  PLT 316   Recent Labs    04/08/20 1944  CREATININE 0.86    Intake/Output Summary (Last 24 hours) at 04/09/2020 1517 Last data filed at 04/09/2020 0900 Gross per 24 hour  Intake 218 ml  Output --  Net 218 ml     Physical Exam: Vital Signs Blood pressure 135/88, pulse 79, temperature 98 F (36.7 C), resp. rate 16, height 5\' 9"  (1.753 m), weight 57.2 kg, SpO2 100 %.  General: Alert and oriented x 3, No apparent distress HEENT: Head is normocephalic, atraumatic, PERRLA, EOMI, sclera anicteric, oral mucosa pink and moist, dentition intact, ext ear canals clear,  Neck: Supple without JVD or lymphadenopathy Heart: Reg rate and rhythm. No murmurs rubs or gallops Chest: CTA bilaterally without wheezes, rales, or rhonchi; no distress Abdomen: Soft, non-tender, non-distended, bowel sounds positive. Musculoskeletal:     Cervical back: Normal range of motion and neck supple.     Comments: No edema or tenderness in extremities  Skin:    General: Skin is warm and dry.  Neurological:     Mental Status: She is alert.     Comments: Alert  Provides her name age and place.   Follows simple commands. Motor: LUE: 3+/5 proximal distal LLE: 3+/5 proximal  distal Dysarthria Left-sided numbness  Psychiatric:        Mood and Affect: Affect is blunt.        Speech: Speech is delayed.        Assessment/Plan: 1. Functional deficits secondary to right thalamic infarction which require 3+ hours per day of interdisciplinary therapy in a comprehensive inpatient rehab setting.  Physiatrist is providing close team supervision and 24 hour management of active medical problems listed below.  Physiatrist and rehab team continue to assess barriers to discharge/monitor patient progress toward functional and medical goals  Care Tool:  Bathing    Body parts bathed by patient: Left arm, Right arm, Chest, Abdomen, Front perineal area, Buttocks, Right upper leg, Left upper leg, Right lower leg, Left lower leg, Face         Bathing assist Assist Level: Contact Guard/Touching assist (simulated)     Upper Body Dressing/Undressing Upper body dressing   What is the patient wearing?: Bra, Pull over shirt    Upper body assist Assist Level: Set up assist    Lower Body Dressing/Undressing Lower body dressing    Lower body dressing activity did not occur: Refused       Lower body assist       Toileting Toileting    Toileting assist Assist for toileting: Contact Guard/Touching assist     Transfers Chair/bed transfer  Transfers assist     Chair/bed transfer assist level: Minimal Assistance - Patient > 75%     Locomotion Ambulation   Ambulation assist      Assist level: Minimal Assistance - Patient > 75% Assistive device: No Device Max distance: 50   Walk 10 feet activity   Assist     Assist level: Minimal Assistance - Patient > 75% Assistive device: No Device   Walk 50 feet activity   Assist    Assist level: Minimal Assistance - Patient > 75% Assistive device: No Device    Walk 150 feet activity   Assist Walk 150 feet activity did not occur: Safety/medical concerns         Walk 10 feet on uneven surface  activity   Assist Walk 10 feet on uneven surfaces activity did not occur: Safety/medical concerns          Wheelchair     Assist Will patient use wheelchair at discharge?: No Type of Wheelchair: Manual    Wheelchair assist level: Minimal Assistance - Patient > 75% Max wheelchair distance: 150    Wheelchair 50 feet with 2 turns activity    Assist        Assist Level: Minimal Assistance - Patient > 75%   Wheelchair 150 feet activity     Assist      Assist Level: Minimal Assistance - Patient > 75%   Blood pressure 135/88, pulse 79, temperature 98 F (36.7 C), resp. rate 16, height 5\' 9"  (1.753 m), weight 57.2 kg, SpO2 100 %.  Medical Problem List and Plan: 1.  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  Antithrombotics: -DVT/anticoagulation: Lovenox             -antiplatelet therapy: Aspirin 81 mg daily and Plavix 75 mg  daily x21 days then aspirin alone 3. Pain Management: Tylenol as needed. Well controlled 4. Mood: Provide emotional support             -antipsychotic agents: N/A 5. Neuropsych: This patient is capable of making decisions on her own behalf. 6. Skin/Wound Care: Routine skin checks 7. Fluids/Electrolytes/Nutrition: Routine in and outs.   8.  Hypertension.  Norvasc 10 mg daily. Uncontrolled- start Lisinopril 2.5mg .              Monitor the increased mobility. 9.  Hyperlipidemia.  Lipitor 10.  Tobacco/alcohol and polysubstance abuse.  Urine drug screen positive cocaine.  Counsel 11.  Prediabetes.  Hemoglobin A1c 6.3.  Follow-up outpatient.             Monitor increase mobility 12. Disposition: Patient asks about disability paperwork and I advised to discuss with SW on Monday    LOS: 1 days A FACE TO FACE EVALUATION WAS [place] 04/09/2020, 3:17 PM

## 2020-04-09 NOTE — Evaluation (Signed)
Speech Language Pathology Assessment and Plan  Patient Details  Name: Roberta Calderon MRN: [id number] Date of Birth: 1971/03/04  SLP Diagnosis: N/A Rehab Potential: N/A ELOS: N/A   Today's Date: 04/09/2020 SLP Individual Time: 1030-1100 SLP Individual Time Calculation (min): 30 min   Hospital Problem: Principal Problem:   Right thalamic infarction [facility])  Past Medical History:  Past Medical History:  Diagnosis Date  . Hypertension    Past Surgical History: No past surgical history on file.  Assessment / Plan / Recommendation Clinical Impression Patient is a 49 year old right-handed female with history of hypertension and tobacco/alcohol abuse who presented 04/06/2020 with left-sided weakness x1 week.  History taken from chart review and patient.  Patient lives alone works at [employer] independent prior to admission.  She has a mother and daughter in the area.  Cranial CT scan showed scattered areas of decreased attenuation on the right consistent with a subacute ischemia.  Patient did not receive TPA.  CTA of head and neck no intracranial large vessel occlusion or proximal high-grade stenosis.  MRI completed technically limited exam due to patient's inability to tolerate the study showed a 1 cm focus of mild diffusion abnormality of the posterior limb of the right internal capsule consistent with a small subacute infarction.  Suspected scattered chronic microhemorrhages clustered about the cerebellum, brainstem and deep gray nuclei.  Echocardiogram with ejection fraction of 70%.  Grade 1 diastolic dysfunction.  Admission chemistries urine drug screen positive cocaine and benzos, glucose 108, hemoglobin A1c 6.3, hemoglobin 15.2, TSH 0.887, SARS coronavirus negative. Currently maintained on aspirin and Plavix for CVA prophylaxis x3 weeks days and then aspirin alone.  Subcutaneous Lovenox for DVT prophylaxis.  Tolerating a regular diet.  Therapy evaluations completed and patient was  admitted for a comprehensive rehab program.  Patient admitted 04/08/20.  Patient was administered the Cognistat and scored WFL on all subtests and no cognitive impairments were observed during informal/functional tasks. Patient also endorsed that she has not noticed any cognitive impairments and feels she is at her baseline level of cognitive functioning. Therefore, no SLP intervention is warranted at this time. Patient verbalized understanding and agreement.    Skilled Therapeutic Interventions          Administered a cognitive-linguistic evaluation, please see above for details.   SLP Assessment  Patient does not need any further Speech Lanaguage Pathology Services    Recommendations  Oral Care Recommendations: Oral care BID Patient destination: Home Follow up Recommendations: None Equipment Recommended: None recommended by SLP    SLP Frequency N/A  SLP Duration  SLP Intensity  SLP Treatment/Interventions N/A  N/A  N/A   Pain No/Denies Pain   Prior Functioning Type of Home: House  Lives With: Family Available Help at Discharge: Family  SLP Evaluation Cognition Overall Cognitive Status: Within Functional Limits for tasks assessed Arousal/Alertness: Awake/alert Focused Attention: Appears intact Sustained Attention: Impaired Selective Attention: Appears intact Memory: Appears intact Problem Solving: Appears intact Problem Solving Impairment: Functional complex Behaviors: Impulsive Safety/Judgment: Appears intact Comments: mild impulsivity  Comprehension Auditory Comprehension Overall Auditory Comprehension: Appears within functional limits for tasks assessed Expression Expression Primary Mode of Expression: Verbal Verbal Expression Overall Verbal Expression: Appears within functional limits for tasks assessed Oral Motor Oral Motor/Sensory Function Overall Oral Motor/Sensory Function: Within functional limits Motor Speech Overall Motor Speech: Appears within  functional limits for tasks assessed  Care Tool Care Tool Cognition Expression of Ideas and Wants Expression of Ideas and Wants: Without difficulty (complex and basic) -  expresses complex messages without difficulty and with speech that is clear and easy to understand   Understanding Verbal and Non-Verbal Content Understanding Verbal and Non-Verbal Content: Understands (complex and basic) - clear comprehension without cues or repetitions   Memory/Recall Ability *first 3 days only Memory/Recall Ability *first 3 days only: Current season;That he or she is in a hospital/hospital unit     Short Term Goals: N/A   Refer to Care Plan for Long Term Goals  Recommendations for other services: None   Discharge Criteria: Patient will be discharged from SLP if patient refuses treatment 3 consecutive times without medical reason, if treatment goals not met, if there is a change in medical status, if patient makes no progress towards goals or if patient is discharged from hospital.  The above assessment, treatment plan, treatment alternatives and goals were discussed and mutually agreed upon: by patient  Vernestine Brodhead 04/09/2020, 11:58 AM

## 2020-04-09 NOTE — Progress Notes (Signed)
2240 Pt called out requesting pain medication due to her having chest pain 10/10. This nurse took pt's VS, placed on 02 at 2Ls per [place], 12 Lead EKG done and rapid response called. Pt adamantly refusing IV access. Dr Ranell Patrick notified and updated. Orders received.

## 2020-04-09 NOTE — Progress Notes (Signed)
Pt now rating her pain 4/10 and has rolled over to go to sleep. Will continue to monitor.

## 2020-04-09 NOTE — Progress Notes (Signed)
Physical Therapy Session Note  Patient Details  Name: Roberta Calderon MRN: [id number] Date of Birth: 1971/02/03  Today's Date: 04/09/2020 PT Individual Time: 2122-4825 PT Individual Time Calculation (min): 40 min   Short Term Goals: Week 1:  PT Short Term Goal 1 (Week 1): STG=LTG due to ELOS  Skilled Therapeutic Interventions/Progress Updates:   Pt received supine in bed and agreeable to PT. Supine>sit transfer without assist or cues . Stand pivot transfer to [facility] with no AD and supervision assist. Patient demonstrates increased fall risk as noted by score of   42/56 on Berg Balance Scale.  (<36= high risk for falls, close to 100%; 37-45 significant >80%; 46-51 moderate >50%; 52-55 lower >25%) PT instructed pt in TUG: 17sec (average of 3 trials; >13.5 sec indicates increased fall risk) PT instructed pt in DGI. See below for results. Demonstrates increased fall risk with score of 18/24. (<19 indicates increased fall risk)   Pt reports need for BM. Toilet transfer with supervision assist from Pt for safety. Pt able to performed all pericare and clothing management without assist from PT. Pt returned to room and performed stand pivot transfer to bed with no AD and supervision assist. Sit>supine completed with without assist, and left supine in bed with call bell in reach and all needs met.        Therapy Documentation Precautions:  Precautions Precautions: Fall Precaution Comments: Mild Lt hemi Restrictions Weight Bearing Restrictions: No General:   Vital Signs: Therapy Vitals Temp: 98 F (36.7 C) Pulse Rate: 79 Resp: 16 BP: 135/88 Patient Position (if appropriate): Lying Oxygen Therapy SpO2: 100 % O2 Device: Room Air     Trunk/Postural Assessment : Cervical Assessment Cervical Assessment: Within Functional Limits Thoracic Assessment Thoracic Assessment: Within Functional Limits Lumbar Assessment Lumbar Assessment: Within Functional Limits Postural Control Postural  Control: Deficits on evaluation (mild Lt lateral sway during functional ambulation)  Balance: Balance Balance Assessed: Yes Standardized Balance Assessment Standardized Balance Assessment: Berg Balance Test;Dynamic Gait Index;Timed Up and Go Test Berg Balance Test Sit to Stand: Able to stand  independently using hands Standing Unsupported: Able to stand 2 minutes with supervision Sitting with Back Unsupported but Feet Supported on Floor or Stool: Able to sit safely and securely 2 minutes Stand to Sit: Controls descent by using hands Transfers: Able to transfer safely, definite need of hands Standing Unsupported with Eyes Closed: Able to stand 10 seconds with supervision Standing Ubsupported with Feet Together: Able to place feet together independently and stand for 1 minute with supervision From Standing, Reach Forward with Outstretched Arm: Can reach confidently >25 cm (10") From Standing Position, Pick up Object from Floor: Able to pick up shoe, needs supervision From Standing Position, Turn to Look Behind Over each Shoulder: Looks behind one side only/other side shows less weight shift Turn 360 Degrees: Able to turn 360 degrees safely one side only in 4 seconds or less Standing Unsupported, Alternately Place Feet on Step/Stool: Able to complete 4 steps without aid or supervision Standing Unsupported, One Foot in Front: Able to plae foot ahead of the other independently and hold 30 seconds Standing on One Leg: Able to lift leg independently and hold equal to or more than 3 seconds Total Score: 42 Dynamic Gait Index Level Surface: Normal Change in Gait Speed: Mild Impairment Gait with Horizontal Head Turns: Mild Impairment Gait with Vertical Head Turns: Moderate Impairment Gait and Pivot Turn: Normal Step Over Obstacle: Mild Impairment Step Around Obstacles: Normal Steps: Mild Impairment Total Score: 18  Timed Up and Go Test TUG: Normal TUG Normal TUG (seconds): 17 Dynamic Sitting  Balance Dynamic Sitting - Balance Support: No upper extremity supported;During functional activity (donning Ted hose EOB) Dynamic Sitting - Level of Assistance: 5: Stand by assistance Dynamic Standing Balance Dynamic Standing - Balance Support: No upper extremity supported;During functional activity Dynamic Standing - Level of Assistance: 4: Min assist (ambulating to the toilet without AD) Dynamic Standing - Balance Activities: Lateral lean/weight shifting;Forward lean/weight shifting    Therapy/Group: Individual Therapy  Lorie Phenix 04/09/2020, 4:40 PM

## 2020-04-09 NOTE — Plan of Care (Signed)
  Problem: Consults Goal: Nutrition Consult-if indicated Outcome: Progressing   Problem: RH BOWEL ELIMINATION Goal: RH STG MANAGE BOWEL WITH ASSISTANCE Description: STG Manage Bowel with Assistance. Outcome: Progressing Goal: RH STG MANAGE BOWEL W/MEDICATION W/ASSISTANCE Description: STG Manage Bowel with Medication with Assistance. Outcome: Progressing   Problem: RH BLADDER ELIMINATION Goal: RH STG MANAGE BLADDER WITH ASSISTANCE Description: STG Manage Bladder With Assistance Outcome: Progressing   Problem: RH SKIN INTEGRITY Goal: RH STG MAINTAIN SKIN INTEGRITY WITH ASSISTANCE Description: STG Maintain Skin Integrity With Assistance. Outcome: Progressing   Problem: RH SAFETY Goal: RH STG ADHERE TO SAFETY PRECAUTIONS W/ASSISTANCE/DEVICE Description: STG Adhere to Safety Precautions With Assistance/Device. Outcome: Progressing Goal: RH STG DECREASED RISK OF FALL WITH ASSISTANCE Description: STG Decreased Risk of Fall With Assistance. Outcome: Progressing

## 2020-04-09 NOTE — Plan of Care (Signed)
  Problem: RH Balance Goal: LTG Patient will maintain dynamic standing balance (PT) Description: LTG:  Patient will maintain dynamic standing balance with assistance during mobility activities (PT) Flowsheets (Taken 04/09/2020 0902) LTG: Pt will maintain dynamic standing balance during mobility activities with:: Independent with assistive device    Problem: RH Bed Mobility Goal: LTG Patient will perform bed mobility with assist (PT) Description: LTG: Patient will perform bed mobility with assistance, with/without cues (PT). Flowsheets (Taken 04/09/2020 0902) LTG: Pt will perform bed mobility with assistance level of: Independent   Problem: RH Bed to Chair Transfers Goal: LTG Patient will perform bed/chair transfers w/assist (PT) Description: LTG: Patient will perform bed to chair transfers with assistance (PT). Flowsheets (Taken 04/09/2020 0902) LTG: Pt will perform Bed to Chair Transfers with assistance level: Independent with assistive device    Problem: RH Car Transfers Goal: LTG Patient will perform car transfers with assist (PT) Description: LTG: Patient will perform car transfers with assistance (PT). Flowsheets (Taken 04/09/2020 0902) LTG: Pt will perform car transfers with assist:: Supervision/Verbal cueing   Problem: RH Furniture Transfers Goal: LTG Patient will perform furniture transfers w/assist (OT/PT) Description: LTG: Patient will perform furniture transfers  with assistance (OT/PT). Flowsheets (Taken 04/09/2020 0902) LTG: Pt will perform furniture transfers with assist:: Independent with assistive device    Problem: RH Floor Transfers Goal: LTG Patient will perform floor transfers w/assist (PT) Description: LTG: Patient will perform floor transfers with assistance (PT). Flowsheets (Taken 04/09/2020 0902) LTG: PT WILL PERFORM FLOOR TRANFERS  WITH  ASSIST:: Supervision/Verbal cueing   Problem: RH Ambulation Goal: LTG Patient will ambulate in controlled environment  (PT) Description: LTG: Patient will ambulate in a controlled environment, # of feet with assistance (PT). Flowsheets (Taken 04/09/2020 0902) LTG: Pt will ambulate in controlled environ  assist needed:: Independent with assistive device LTG: Ambulation distance in controlled environment: 183ft with LRAD Goal: LTG Patient will ambulate in home environment (PT) Description: LTG: Patient will ambulate in home environment, # of feet with assistance (PT). Flowsheets (Taken 04/09/2020 0902) LTG: Pt will ambulate in home environ  assist needed:: Independent with assistive device LTG: Ambulation distance in home environment: 64ft with LRAD Goal: LTG Patient will ambulate in community environment (PT) Description: LTG: Patient will ambulate in community environment, # of feet with assistance (PT). Flowsheets (Taken 04/09/2020 0902) LTG: Pt will ambulate in community environ  assist needed:: Independent with assistive device LTG: Ambulation distance in community environment: 136ft with LRAD   Problem: RH Stairs Goal: LTG Patient will ambulate up and down stairs w/assist (PT) Description: LTG: Patient will ambulate up and down # of stairs with assistance (PT) Flowsheets (Taken 04/09/2020 0902) LTG: Pt will ambulate up/down stairs assist needed:: Supervision/Verbal cueing LTG: Pt will  ambulate up and down number of stairs: 4 steps with LRAD to improve access of community

## 2020-04-09 NOTE — Evaluation (Signed)
Physical Therapy Assessment and Plan  Patient Details  Name: Roberta Calderon MRN: [id number] Date of Birth: 01-08-1971  PT Diagnosis: Abnormal posture, Abnormality of gait, Ataxic gait, Coordination disorder, Hemiplegia non-dominant, Impaired sensation and Muscle weakness Rehab Potential: Good ELOS: 7-10 days   Today's Date: 04/09/2020 PT Individual Time: 3716-9678 PT Individual Time Calculation (min): 45 min    Hospital Problem: Principal Problem:   Right thalamic infarction [facility])   Past Medical History:  Past Medical History:  Diagnosis Date  . Hypertension    Past Surgical History: No past surgical history on file.  Assessment & Plan Clinical Impression: Patient is a 49 year old right-handed female with history of hypertension and tobacco/alcohol abuse who presented 04/06/2020 with left-sided weakness x1 week.  History taken from chart review and patient.  Patient lives alone works at [employer] independent prior to admission.  She has a mother and daughter in the area.  Cranial CT scan showed scattered areas of decreased attenuation on the right consistent with a subacute ischemia.  Patient did not receive TPA.  CTA of head and neck no intracranial large vessel occlusion or proximal high-grade stenosis.  MRI completed technically limited exam due to patient's inability to tolerate the study showed a 1 cm focus of mild diffusion abnormality of the posterior limb of the right internal capsule consistent with a small subacute infarction.  Suspected scattered chronic microhemorrhages clustered about the cerebellum, brainstem and deep gray nuclei.  Echocardiogram with ejection fraction of 70%.  Grade 1 diastolic dysfunction.  Admission chemistries urine drug screen positive cocaine and benzos, glucose 108, hemoglobin A1c 6.3, hemoglobin 15.2, TSH 0.887, SARS coronavirus negative.  Currently maintained on aspirin and Plavix for CVA prophylaxis x3 weeks days and then aspirin alone.   Subcutaneous Lovenox for DVT prophylaxis.  Tolerating a regular diet.   Patient transferred to CIR on 04/08/2020 .   Patient currently requires min with mobility secondary to muscle weakness, muscle joint tightness and muscle paralysis, decreased cardiorespiratoy endurance, unbalanced muscle activation and decreased coordination and decreased standing balance, decreased postural control, hemiplegia and decreased balance strategies.  Prior to hospitalization, patient was independent  with mobility and lived with Family in a House home.  Home access is  Level entry.  Patient will benefit from skilled PT intervention to maximize safe functional mobility, minimize fall risk and decrease caregiver burden for planned discharge home with intermittent assist.  Anticipate patient will benefit from follow up [facility] at discharge.  PT - End of Session Activity Tolerance: Tolerates < 10 min activity with changes in vital signs Endurance Deficit: Yes PT Assessment Rehab Potential (ACUTE/IP ONLY): Good PT Barriers to Discharge: [place] home environment;Decreased caregiver support;Home environment access/layout;Lack of/limited family support;Insurance for SNF coverage;Medication compliance PT Patient demonstrates impairments in the following area(s): Balance;Safety;Behavior;Sensory;Endurance;Skin Integrity;Motor;Nutrition;Pain PT Transfers Functional Problem(s): Bed Mobility;Bed to Chair;Car;Furniture;Floor PT Locomotion Functional Problem(s): Ambulation;Wheelchair Mobility;Stairs PT Plan PT Intensity: Minimum of 1-2 x/day ,45 to 90 minutes PT Frequency: 5 out of 7 days PT Duration Estimated Length of Stay: 7-10 days PT Treatment/Interventions: Ambulation/gait training;Community reintegration;DME/adaptive equipment instruction;Neuromuscular re-education;Psychosocial support;Stair training;UE/LE Strength taining/ROM;Wheelchair propulsion/positioning;UE/LE Coordination activities;Therapeutic Activities;Skin  care/wound management;Functional electrical stimulation;Pain management;Balance/vestibular training;Discharge planning;Cognitive remediation/compensation;Disease management/prevention;Patient/family education;Splinting/orthotics;Therapeutic Exercise;Visual/perceptual remediation/compensation;Functional mobility training PT Transfers Anticipated Outcome(s): Mod I transfers with LRAD PT Locomotion Anticipated Outcome(s): Mod I gait with LRAD. superivsion assist stair management. PT Recommendation Recommendations for Other Services: Therapeutic Recreation consult Therapeutic Recreation Interventions: Stress management;Outing/community reintergration Follow Up Recommendations: Home health PT Patient destination: Home Equipment Recommended: To  be determined;Rolling walker with 5" wheels;Cane   PT Evaluation Precautions/Restrictions   fall  General PT Amount of Missed Time (min): 15 Minutes PT Missed Treatment Reason: Other (Comment);Patient fatigue (labile BP) Vital SignsTherapy Vitals BP: (!) 161/97 Pain   denies Home Living/Prior Functioning Home Living Available Help at Discharge: Family Type of Home: House Home Access: Level entry Home Layout: One level  Lives With: Family Prior Function Level of Independence: Independent with basic ADLs Vision/Perception  Perception Perception: Within Functional Limits Praxis Praxis: Intact  Cognition Overall Cognitive Status: Impaired/Different from baseline Arousal/Alertness: Awake/alert Focused Attention: Appears intact Sustained Attention: Impaired Problem Solving: Impaired Problem Solving Impairment: Functional complex Behaviors: Impulsive Safety/Judgment: Impaired Comments: mild impulsivity Sensation Sensation Light Touch: Impaired Detail Light Touch Impaired Details: Impaired LUE;Impaired LLE Proprioception: Appears Intact Additional Comments: reports mild parathesia in the LUE>LLE Coordination Gross Motor Movements are Fluid  and Coordinated: No Finger Nose Finger Test: decreased speed, mildf dysmetria L Heel Shin Test: decreased speed and ROM L Motor  Motor Motor: Hemiplegia;Ataxia Motor - Skilled Clinical Observations: mild L sided Hemiplegia UE >LE   Trunk/Postural Assessment  Cervical Assessment Cervical Assessment: Within Functional Limits Thoracic Assessment Thoracic Assessment: Within Functional Limits Lumbar Assessment Lumbar Assessment: Within Functional Limits Postural Control Postural Control: Deficits on evaluation (mild lateral lean L in standing)  Balance Balance Balance Assessed: Yes Static Sitting Balance Static Sitting - Balance Support: No upper extremity supported Static Sitting - Level of Assistance: 7: Independent Dynamic Sitting Balance Dynamic Sitting - Balance Support: No upper extremity supported;During functional activity Dynamic Sitting - Level of Assistance: 5: Stand by assistance Static Standing Balance Static Standing - Balance Support: No upper extremity supported Static Standing - Level of Assistance: 5: Stand by assistance;4: Min assist Dynamic Standing Balance Dynamic Standing - Balance Support: No upper extremity supported;During functional activity Dynamic Standing - Level of Assistance: 4: Min assist Extremity Assessment      RLE Assessment RLE Assessment: Within Functional Limits LLE Assessment LLE Assessment: Exceptions to [facility] General Strength Comments: 4/5 proximal to distal  Care Tool Care Tool Bed Mobility Roll left and right activity   Roll left and right assist level: Supervision/Verbal cueing    Sit to lying activity   Sit to lying assist level: Supervision/Verbal cueing    Lying to sitting edge of bed activity   Lying to sitting edge of bed assist level: Supervision/Verbal cueing     Care Tool Transfers Sit to stand transfer   Sit to stand assist level: Minimal Assistance - Patient > 75%    Chair/bed transfer   Chair/bed transfer  assist level: Minimal Assistance - Patient > 75%     Toilet transfer   Assist Level: Minimal Assistance - Patient > 75%    Car transfer   Car transfer assist level: Minimal Assistance - Patient > 75%      Care Tool Locomotion Ambulation   Assist level: Minimal Assistance - Patient > 75% Assistive device: No Device Max distance: 50  Walk 10 feet activity   Assist level: Minimal Assistance - Patient > 75% Assistive device: No Device   Walk 50 feet with 2 turns activity   Assist level: Minimal Assistance - Patient > 75% Assistive device: No Device  Walk 150 feet activity Walk 150 feet activity did not occur: Safety/medical concerns      Walk 10 feet on uneven surfaces activity Walk 10 feet on uneven surfaces activity did not occur: Safety/medical concerns      Stairs Stair activity  did not occur: Safety/medical concerns        Walk up/down 1 step activity Walk up/down 1 step or curb (drop down) activity did not occur: Safety/medical concerns     Walk up/down 4 steps activity did not occuR: Safety/medical concerns  Walk up/down 4 steps activity      Walk up/down 12 steps activity Walk up/down 12 steps activity did not occur: Safety/medical concerns      Pick up small objects from floor Pick up small object from the floor (from standing position) activity did not occur: Safety/medical concerns      Wheelchair Will patient use wheelchair at discharge?: No Type of Wheelchair: Manual   Wheelchair assist level: Minimal Assistance - Patient > 75% Max wheelchair distance: 150  Wheel 50 feet with 2 turns activity   Assist Level: Minimal Assistance - Patient > 75%  Wheel 150 feet activity   Assist Level: Minimal Assistance - Patient > 75%    Refer to Care Plan for Long Term Goals  SHORT TERM GOAL WEEK 1 PT Short Term Goal 1 (Week 1): STG=LTG due to ELOS  Recommendations for other services: Therapeutic Recreation  Stress management and Outing/community  reintegration  Skilled Therapeutic Intervention  Pt received supine in bed and agreeable to PT. Supine>sit transfer with supervision assist as listed below for improved safety awareness. Pt reports need for BM. Ambulatory transfer to toilet with min-supervision assist and RW. Pt able to complete clothing management and peri care with supervision assist and UE support on rail and RW. PT instructed patient in PT Evaluation and initiated treatment intervention; see above for results. PT educated patient in [place], rehab potential, rehab goals, and discharge recommendations. PT assessed BP sitting. 173/88, HR 83. Standing. 148/116, HR 91. Orthostatic s/s present. Car transfer training with min assist and cues for proper UE palcement and awareness of LLE positioning. PT reassessed BP following gait trainig sitting 166/92, HR 78. Standing 146/112, HR98. Pt reporting increasing s/s throughout session. Pt returned to room and performed stand pivot transfer to bed with no AD and CGA. Sit>supine completed with supervision assist with cues for improved positioning in bed prior to transfer. Pt left supine in bed with call bell in reach and all needs met.    Mobility Bed Mobility Bed Mobility: Rolling Right;Supine to Sit;Sit to Supine Rolling Right: Supervision/verbal cueing Supine to Sit: Supervision/Verbal cueing Sit to Supine: Supervision/Verbal cueing Transfers Transfers: Sit to Stand;Stand Pivot Transfers Sit to Stand: Supervision/Verbal cueing Stand Pivot Transfers: Supervision/Verbal cueing Stand Pivot Transfer Details: Verbal cues for technique;Verbal cues for safe use of DME/AE Stand Pivot Transfer Details (indicate cue type and reason): cues for AD management Transfer (Assistive device): Rolling walker Locomotion  Gait Ambulation: Yes Gait Assistance: Supervision/Verbal cueing Gait Distance (Feet): 50 Feet Assistive device: Rolling walker Gait Gait: Yes Gait Pattern: Impaired Gait Pattern:  Ataxic;Lateral hip instability;Lateral trunk lean to left Stairs / Additional Locomotion Stairs: No (labile BP) Wheelchair Mobility Wheelchair Mobility: Yes Wheelchair Assistance: Minimal assistance - Patient >75% Wheelchair Propulsion: Both upper extremities Wheelchair Parts Management: Needs assistance Distance: 150   Discharge Criteria: Patient will be discharged from PT if patient refuses treatment 3 consecutive times without medical reason, if treatment goals not met, if there is a change in medical status, if patient makes no progress towards goals or if patient is discharged from hospital.  The above assessment, treatment plan, treatment alternatives and goals were discussed and mutually agreed upon: by patient  Lorie Phenix 04/09/2020, 8:57 AM

## 2020-04-09 NOTE — Progress Notes (Signed)
Patient refused Lovenox injection. Pt was educated on the use and importance of Lovenox. Pt declined administration. Amanda Cockayne, LPN

## 2020-04-10 LAB — JAK2 GENOTYPR

## 2020-04-10 MED ORDER — LISINOPRIL 5 MG PO TABS
5.0000 mg | ORAL_TABLET | Freq: Every day | ORAL | Status: DC
  Administered 2020-04-11 – 2020-04-13 (×3): 5 mg via ORAL
  Filled 2020-04-10 (×3): qty 1

## 2020-04-10 NOTE — Progress Notes (Signed)
Pt is calm and resting in room. Pt is not agitated with staff. Pt has been calm and cooperative. Will continue to monitor. Amanda Cockayne, LPN

## 2020-04-10 NOTE — Progress Notes (Signed)
Pt refused lovenox injection. Pt has been educated on the use and benefits from lovenox. Pt still declines. Amanda Cockayne, LPN

## 2020-04-10 NOTE — Plan of Care (Signed)
°  Problem: Consults Goal: Nutrition Consult-if indicated Outcome: Progressing   Problem: RH BOWEL ELIMINATION Goal: RH STG MANAGE BOWEL WITH ASSISTANCE Description: STG Manage Bowel with Assistance. Outcome: Progressing Goal: RH STG MANAGE BOWEL W/MEDICATION W/ASSISTANCE Description: STG Manage Bowel with Medication with Assistance. Outcome: Progressing   Problem: RH BLADDER ELIMINATION Goal: RH STG MANAGE BLADDER WITH ASSISTANCE Description: STG Manage Bladder With Assistance Outcome: Progressing   Problem: RH SKIN INTEGRITY Goal: RH STG MAINTAIN SKIN INTEGRITY WITH ASSISTANCE Description: STG Maintain Skin Integrity With Assistance. Outcome: Progressing   Problem: RH SAFETY Goal: RH STG ADHERE TO SAFETY PRECAUTIONS W/ASSISTANCE/DEVICE Description: STG Adhere to Safety Precautions With Assistance/Device. Outcome: Progressing Goal: RH STG DECREASED RISK OF FALL WITH ASSISTANCE Description: STG Decreased Risk of Fall With Assistance. Outcome: Progressing

## 2020-04-10 NOTE — Progress Notes (Addendum)
PHYSICAL MEDICINE & REHABILITATION PROGRESS NOTE   Subjective/Complaints: Overnight rapid response was called for chest pain 10/10- improved to 4/10 with nitroglycerin. ECG was stable. Oxygen was applied for comfort. Refused labs. Pain has eased this morning. She asks about left sided "bump" in belly  ROS: Denies pain, constipation, SOB  Objective:   No results found. Recent Labs    04/08/20 1944  WBC 5.7  HGB 14.0  HCT 45.3  PLT 316   Recent Labs    04/08/20 1944  CREATININE 0.86    Intake/Output Summary (Last 24 hours) at 04/10/2020 1116 Last data filed at 04/10/2020 0837 Gross per 24 hour  Intake 639 ml  Output --  Net 639 ml     Physical Exam: Vital Signs Blood pressure (!) 150/89, pulse 70, temperature (!) 97.3 F (36.3 C), resp. rate 14, height 5\' 9"  (1.753 m), weight 57.2 kg, SpO2 99 %. General: Alert and oriented x 3, No apparent distress HEENT: Head is normocephalic, atraumatic, PERRLA, EOMI, sclera anicteric, oral mucosa pink and moist, dentition intact, ext ear canals clear,  Neck: Supple without JVD or lymphadenopathy Heart: Reg rate and rhythm. No murmurs rubs or gallops Chest: CTA bilaterally without wheezes, rales, or rhonchi; no distress Abdomen: Left sided palpable abdominal nodule- nontender Extremities: No clubbing, cyanosis, or edema. Pulses are 2+ Skin: Clean and intact without signs of breakdown Musculoskeletal:     Cervical back: Normal range of motion and neck supple.     Comments: No edema or tenderness in extremities  Skin:    General: Skin is warm and dry.  Neurological:     Mental Status: She is alert.     Comments: Alert  Provides her name age and place.   Follows simple commands. Motor: LUE: 3+/5 proximal distal LLE: 3+/5 proximal distal Dysarthria Left-sided numbness  Psychiatric:        Mood and Affect: Affect is blunt.        Speech: Speech is delayed.   Assessment/Plan: 1. Functional deficits secondary to  right thalamic infarction which require 3+ hours per day of interdisciplinary therapy in a comprehensive inpatient rehab setting.  Physiatrist is providing close team supervision and 24 hour management of active medical problems listed below.  Physiatrist and rehab team continue to assess barriers to discharge/monitor patient progress toward functional and medical goals  Care Tool:  Bathing    Body parts bathed by patient: Left arm, Right arm, Chest, Abdomen, Front perineal area, Buttocks, Right upper leg, Left upper leg, Right lower leg, Left lower leg, Face         Bathing assist Assist Level: Contact Guard/Touching assist (simulated)     Upper Body Dressing/Undressing Upper body dressing   What is the patient wearing?: Bra, Pull over shirt    Upper body assist Assist Level: Set up assist    Lower Body Dressing/Undressing Lower body dressing    Lower body dressing activity did not occur: Refused       Lower body assist       Toileting Toileting    Toileting assist Assist for toileting: Contact Guard/Touching assist     Transfers Chair/bed transfer  Transfers assist     Chair/bed transfer assist level: Minimal Assistance - Patient > 75%     Locomotion Ambulation   Ambulation assist      Assist level: Minimal Assistance - Patient > 75% Assistive device: No Device Max distance: 50   Walk 10 feet activity   Assist  Assist level: Minimal Assistance - Patient > 75% Assistive device: No Device   Walk 50 feet activity   Assist    Assist level: Minimal Assistance - Patient > 75% Assistive device: No Device    Walk 150 feet activity   Assist Walk 150 feet activity did not occur: Safety/medical concerns         Walk 10 feet on uneven surface  activity   Assist Walk 10 feet on uneven surfaces activity did not occur: Safety/medical concerns         Wheelchair     Assist Will patient use wheelchair at discharge?: No Type  of Wheelchair: Manual    Wheelchair assist level: Minimal Assistance - Patient > 75% Max wheelchair distance: 150    Wheelchair 50 feet with 2 turns activity    Assist        Assist Level: Minimal Assistance - Patient > 75%   Wheelchair 150 feet activity     Assist      Assist Level: Minimal Assistance - Patient > 75%   Blood pressure (!) 150/89, pulse 70, temperature (!) 97.3 F (36.3 C), resp. rate 14, height 5\' 9"  (1.753 m), weight 57.2 kg, SpO2 99 %.  Medical Problem List and Plan: 1.  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  Antithrombotics: -DVT/anticoagulation: Lovenox             -antiplatelet therapy: Aspirin 81 mg daily and Plavix 75 mg daily x21 days then aspirin alone 3. Pain Management: Tylenol as needed. Well controlled. 4. Mood: Provide emotional support             -antipsychotic agents: N/A 5. Neuropsych: This patient is capable of making decisions on her own behalf. 6. Skin/Wound Care: Routine skin checks. Left sided lipoma, non-tender- advised patient that this is likely benign lipoma and to let us know if it becomes painful. Has been present for 2 months.  7. Fluids/Electrolytes/Nutrition: Routine in and outs.   8.  Hypertension.  Norvasc 10 mg daily. Uncontrolled- increase Lisinopril to 5mg .               Monitor the increased mobility. 9.  Hyperlipidemia.  Lipitor 10.  Tobacco/alcohol and polysubstance abuse.  Urine drug screen positive cocaine.  Counsel 11.  Prediabetes.  Hemoglobin A1c 6.3.  Follow-up outpatient.             Monitor increase mobility 12. Chest pain: rapid response called 8/15 for 10/10 chest pain. Resolved with nitroglycerin. Currently pain free. ECG negative. Refused labs.  13. Disposition: Patient asks about disability paperwork and  I advised to discuss with SW on Monday    LOS: 2 days [facility] FACE TO [facility] 04/10/2020, 11:16 AM
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
## 2020-04-11 ENCOUNTER — Inpatient Hospital Stay (HOSPITAL_COMMUNITY): Payer: Self-pay | Admitting: Occupational Therapy

## 2020-04-11 ENCOUNTER — Inpatient Hospital Stay (HOSPITAL_COMMUNITY): Payer: Self-pay

## 2020-04-11 ENCOUNTER — Inpatient Hospital Stay (HOSPITAL_COMMUNITY): Payer: Self-pay | Admitting: Speech Pathology

## 2020-04-11 LAB — CBC WITH DIFFERENTIAL/PLATELET
Abs Immature Granulocytes: 0.01 10*3/uL (ref 0.00–0.07)
Basophils Absolute: 0 10*3/uL (ref 0.0–0.1)
Basophils Relative: 1 %
Eosinophils Absolute: 0.3 10*3/uL (ref 0.0–0.5)
Eosinophils Relative: 4 %
HCT: 44.4 % (ref 36.0–46.0)
Hemoglobin: 14 g/dL (ref 12.0–15.0)
Immature Granulocytes: 0 %
Lymphocytes Relative: 38 %
Lymphs Abs: 2.5 10*3/uL (ref 0.7–4.0)
MCH: 26 pg (ref 26.0–34.0)
MCHC: 31.5 g/dL (ref 30.0–36.0)
MCV: 82.5 fL (ref 80.0–100.0)
Monocytes Absolute: 0.5 10*3/uL (ref 0.1–1.0)
Monocytes Relative: 8 %
Neutro Abs: 3.2 10*3/uL (ref 1.7–7.7)
Neutrophils Relative %: 49 %
Platelets: 325 10*3/uL (ref 150–400)
RBC: 5.38 MIL/uL — ABNORMAL HIGH (ref 3.87–5.11)
RDW: 13.5 % (ref 11.5–15.5)
WBC: 6.5 10*3/uL (ref 4.0–10.5)
nRBC: 0.3 % — ABNORMAL HIGH (ref 0.0–0.2)

## 2020-04-11 LAB — COMPREHENSIVE METABOLIC PANEL
ALT: 27 U/L (ref 0–44)
AST: 29 U/L (ref 15–41)
Albumin: 3.5 g/dL (ref 3.5–5.0)
Alkaline Phosphatase: 81 U/L (ref 38–126)
Anion gap: 12 (ref 5–15)
BUN: 13 mg/dL (ref 6–20)
CO2: 22 mmol/L (ref 22–32)
Calcium: 9.5 mg/dL (ref 8.9–10.3)
Chloride: 103 mmol/L (ref 98–111)
Creatinine, Ser: 0.7 mg/dL (ref 0.44–1.00)
GFR calc Af Amer: >60 mL/min (ref >60)
GFR calc non Af Amer: >60 mL/min (ref >60)
Glucose, Bld: 157 mg/dL — ABNORMAL HIGH (ref 70–99)
Potassium: 3.9 mmol/L (ref 3.5–5.1)
Sodium: 137 mmol/L (ref 135–145)
Total Bilirubin: 0.5 mg/dL (ref 0.3–1.2)
Total Protein: 6.7 g/dL (ref 6.5–8.1)

## 2020-04-11 NOTE — Progress Notes (Signed)
[facility] PHYSICAL MEDICINE & REHABILITATION PROGRESS NOTE   Subjective/Complaints:   Pt having no more chest pain- insisting she wants to go home ASP- explained she agreed t come to CIR/inpt rehab and so she needs to stay until safe to go home.   She wasn't happy with this and just kept asking for SW and to go home.     ROS:  Pt denies SOB, abd pain, CP, N/V/C/D, and vision changes   Objective:   No results found. Recent Labs    04/08/20 1944 04/11/20 0810  WBC 5.7 6.5  HGB 14.0 14.0  HCT 45.3 44.4  PLT 316 325   Recent Labs    04/08/20 1944 04/11/20 0810  NA  --  137  K  --  3.9  CL  --  103  CO2  --  22  GLUCOSE  --  157*  BUN  --  13  CREATININE 0.86 0.70  CALCIUM  --  9.5    Intake/Output Summary (Last 24 hours) at 04/11/2020 1617 Last data filed at 04/11/2020 1300 Gross per 24 hour  Intake 594 ml  Output --  Net 594 ml     Physical Exam: Vital Signs Blood pressure 127/75, pulse 86, temperature 97.9 F (36.6 C), resp. rate 18, height 5\' 9"  (1.753 m), weight 57.2 kg, SpO2 100 %. General: laying in bed- supine, alert, NAD HEENT: conjugate gaze Neck: Supple without JVD or lymphadenopathy Heart: RRR- no CP this AM Chest: CTA B/L- no W/R/R- good air movement  Abdomen: Soft, NT, ND, (+)BS  Extremities: No clubbing, cyanosis, or edema. Pulses are 2+ Skin: Clean and intact without signs of breakdown Musculoskeletal:     Cervical back: Normal range of motion and neck supple.     Comments: No edema or tenderness in extremities  Skin:    General: Skin is warm and dry.  Neurological:     Mental Status: She is alert.     Comments: Alert - perseverating on leaving CIR Motor: LUE: 3+/5 proximal distal LLE: 3+/5 proximal distal Dysarthria Left-sided numbness  Psychiatric:   flat, almost agitated, angry about d/c plan  Assessment/Plan: 1. Functional deficits secondary to right thalamic infarction which require 3+ hours per day of interdisciplinary  therapy in a comprehensive inpatient rehab setting.  Physiatrist is providing close team supervision and 24 hour management of active medical problems listed below.  Physiatrist and rehab team continue to assess barriers to discharge/monitor patient progress toward functional and medical goals  Care Tool:  Bathing    Body parts bathed by patient: Left arm, Right arm, Chest, Abdomen, Front perineal area, Buttocks, Right upper leg, Left upper leg, Right lower leg, Left lower leg, Face         Bathing assist Assist Level: Contact Guard/Touching assist (simulated)     Upper Body Dressing/Undressing Upper body dressing   What is the patient wearing?: Bra, Pull over shirt    Upper body assist Assist Level: Set up assist    Lower Body Dressing/Undressing Lower body dressing    Lower body dressing activity did not occur: Refused       Lower body assist       Toileting Toileting    Toileting assist Assist for toileting: Supervision/Verbal cueing     Transfers Chair/bed transfer  Transfers assist     Chair/bed transfer assist level: Minimal Assistance - Patient > 75%     Locomotion Ambulation   Ambulation assist      Assist level:  Supervision/Verbal cueing Assistive device: Walker-rolling Max distance: >341ft   Walk 10 feet activity   Assist     Assist level: Supervision/Verbal cueing Assistive device: Walker-rolling   Walk 50 feet activity   Assist    Assist level: Supervision/Verbal cueing Assistive device: Walker-rolling    Walk 150 feet activity   Assist Walk 150 feet activity did not occur: Safety/medical concerns  Assist level: Supervision/Verbal cueing Assistive device: Walker-rolling    Walk 10 feet on uneven surface  activity   Assist Walk 10 feet on uneven surfaces activity did not occur: Safety/medical concerns   Assist level: Supervision/Verbal cueing Assistive device: Aeronautical engineer Will  patient use wheelchair at discharge?: No Type of Wheelchair: Manual    Wheelchair assist level: Minimal Assistance - Patient > 75% Max wheelchair distance: 150    Wheelchair 50 feet with 2 turns activity    Assist        Assist Level: Minimal Assistance - Patient > 75%   Wheelchair 150 feet activity     Assist      Assist Level: Minimal Assistance - Patient > 75%   Blood pressure 127/75, pulse 86, temperature 97.9 F (36.6 C), resp. rate 18, height 5\' 9"  (1.753 m), weight 57.2 kg, SpO2 100 %.  Medical Problem List and Plan: 1.  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  Antithrombotics: -DVT/anticoagulation: Lovenox             -antiplatelet therapy: Aspirin 81 mg daily and Plavix 75 mg daily x21 days then aspirin alone 3. Pain Management: Tylenol as needed.  8/16- denies pain- con't regimen 4. Mood: Provide emotional support             -antipsychotic agents: N/A 5. Neuropsych: This patient is capable of making decisions on her own behalf. 6. Skin/Wound Care: Routine skin checks. Left sided lipoma, non-tender- advised patient that this is likely benign lipoma and to let us know if it becomes painful. Has been present for 2 months.  7. Fluids/Electrolytes/Nutrition: Routine in and outs.   8.  Hypertension.  Norvasc 10 mg daily. Uncontrolled- increase Lisinopril to 5mg .  8/16- BP controlled- 120s/80s- con't meds               Monitor the increased mobility. 9.  Hyperlipidemia.  Lipitor 10.  Tobacco/alcohol and polysubstance abuse.  Urine drug screen positive cocaine.  Counsel 11.  Prediabetes.  Hemoglobin A1c 6.3.  Follow-up outpatient.             Monitor increase mobility 12. Chest pain: rapid response called 8/15 for 10/10 chest pain. Resolved with nitroglycerin. Currently  pain free. ECG negative. Refused labs.   8/16- AM labs look good- no MI labs drawn 13. Disposition: Patient asks about disability paperwork and I advised to discuss with SW on Monday  8/16- pt perseverating on SW/disability paperwork    LOS: 3 days A FACE TO FACE EVALUATION WAS PERFORMED  Raia Amico 04/11/2020, 4:17 PM

## 2020-04-11 NOTE — Progress Notes (Signed)
   Patient Details  Name: Roberta Calderon MRN: [id number] Date of Birth: 04-Oct-1970  Today's Date: 04/11/2020  Hospital Problems: Principal Problem:   Right thalamic infarction [facility])  Past Medical History:  Past Medical History:  Diagnosis Date  . Hypertension    Past Surgical History: No past surgical history on file. Social History:  reports that she has been smoking cigarettes. She has been smoking about 0.25 packs per day. She has never used smokeless tobacco. She reports current alcohol use. She reports that she does not use drugs.  Family / Support Systems Marital Status: Single Patient Roles: Parent, Other (Comment) (employee-[employer]) Children: Roberta Calderon-daughter [phone] Other Supports: Roberta Calderon-brother Roberta Calderon [phone] Anticipated Caregiver: Mom Ability/Limitations of Caregiver: Mom works and brother can assist-pt doing well will not need 24 hr care Caregiver Availability: 24/7 (if needed) Family Dynamics: Clos with Mom and brother along with grown children  Social History Preferred language: English Religion: Baptist Cultural Background: No issues Education: HS Read: Yes Write: Yes Employment Status: Employed 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 She plans to be mod/i by the time she goes home from rehab. She is vocal regarding her issues Recent Psychosocial Issues: other health issues-somewhat non-compliant with medical issues Psychiatric History: No history-may  benefit from seeing neuro-psych while here due to young age and substance abuse issues Substance Abuse History: Pt does admit to doing drugs but plans on stopping since this is a wake up call for her. She feels she can quit and needs too. Open to resources will  Patient / Family Perceptions, Expectations & Goals Pt/Family understanding of illness & functional limitations: Pt is able to explain her stroke and deficits. She is very pleased she is improving and feels she will get back to her baseline function by DC. Premorbid pt/family roles/activities: Mom, employee, sibling, friend, etc Anticipated changes in roles/activities/participation: resume Pt/family expectations/goals: Pt states: " I hope to keep making progress and will be back taking care of myself when I leave here."  [employer]: Other (Comment) (On section 8 housing list) Premorbid Home Care/DME Agencies: None Transportation available at discharge: others Resource referrals recommended: Neuropsychology  Discharge Planning Living Arrangements: Parent Support Systems: Parent, Other relatives, Friends/neighbors Type of Residence: Shelter/Homeless Insurance Resources: Teacher, adult education Resources: Employment Museum/gallery curator Screen Referred: Yes Living Expenses: Lives with family (couch surfs) Money Management: Patient Does the patient have any problems obtaining your medications?: Yes (Describe) (uninsured) Home Management: Self Patient/Family Preliminary Plans: Plans on going to her Mom's home which she works but she has a brother who can assist if needed. She should rech mod/i level by the time she goes home from rehab Care Coordinator Barriers to Discharge: Medication compliance Care Coordinator Anticipated Follow Up Needs: HH/OP, Other (comment) (DME)  Clinical Impression Pleasant motivated female who is willing to change her lifestyle habits, due to having CVA. She can go to her Mom's home at  discharge. She wants to apply for SSD will give application and is already on the housing list. May need note for work.   Roberta Calderon 04/11/2020, 1:28 PM

## 2020-04-11 NOTE — Progress Notes (Signed)
Patient ID: Roberta Calderon, female   DOB: Nov 25, 1970, 49 y.o.   MRN: [id number] Met with the patient to review the role of the nurse CM and collaboration with the SW [facility]) to facilitate discharge preparations. Reviewed secondary risk factors and effects of smoking on her health. Reviewed DAPT x 3 weeks then ASA solo per MD and low fat and cholesterol dietary options and cholesterol levels.  Discussed need for PCP; [facility]; appointment scheduled at [facility] for May 12, 2020 @ 0930 with Dr. Worthy Rancher. Patient will also need MATCH discount medication card at discharge and prefers Paediatric nurse at [facility] on [address], [place].  Anxious for discharge, reviewed team conference for Wednesday and team will identify a discharge date and recommended follow up services. Margarito Liner

## 2020-04-11 NOTE — Progress Notes (Signed)
Inpatient Rehabilitation Center Individual Statement of Services  Patient Name:  Roberta Calderon  Date:  04/11/2020  Welcome to the [facility].  Our goal is to provide you with an individualized program based on your diagnosis and situation, designed to meet your specific needs.  With this comprehensive rehabilitation program, you will be expected to participate in at least 3 hours of rehabilitation therapies Monday-Friday, with modified therapy programming on the weekends.  Your rehabilitation program will include the following services:  Physical Therapy (PT), Occupational Therapy (OT), Speech Therapy (ST), 24 hour per day rehabilitation nursing, Neuropsychology, Care Coordinator, Rehabilitation Medicine, Nutrition Services and Pharmacy Services  Weekly team conferences will be held on Wednesday to discuss your progress.  Your Inpatient Rehabilitation Care Coordinator will talk with you frequently to get your input and to update you on team discussions.  Team conferences with you and your family in attendance may also be held.  Expected length of stay: 7-10 days  Overall anticipated outcome: mod/i level  Depending on your progress and recovery, your program may change. Your Inpatient Rehabilitation Care Coordinator will coordinate services and will keep you informed of any changes. Your Inpatient Rehabilitation Care Coordinator's name and contact numbers are listed  below.  The following services may also be recommended but are not provided by the [facility]:    [facility] will be made to provide these services after discharge if needed.  Arrangements include referral to agencies that provide these services.  Your insurance has been verified to be:  Self pay Your primary doctor is:  None  Pertinent information will be shared with your  doctor and your insurance company.  Inpatient Rehabilitation Care Coordinator:  Ovidio Kin, [employer]  Information discussed with and copy given to patient by: [name], 04/11/2020, 1:30 PM

## 2020-04-11 NOTE — Progress Notes (Signed)
Patient refused labs this am  

## 2020-04-11 NOTE — Progress Notes (Signed)
Physical Therapy Session Note  Patient Details  Name: Roberta Calderon MRN: [id number] Date of Birth: [birth year]  Today's Date: 04/11/2020 PT Individual Time: 6314-9702 PT Individual Time Calculation (min): 70 min   Short Term Goals: Week 1:  PT Short Term Goal 1 (Week 1): STG=LTG due to ELOS  Skilled Therapeutic Interventions/Progress Updates:    Pt received R sidelying, sleeping on arrival, awakens easily to voice. Pt initially hesitant to participate in PT session and she requests to sleep due to poor nights rest. Lengthy time educating pt the importance of participation with therapy services to maximize mobility and return to lvl of indep prior to hospitalization. Ultimately, pt was agreeable but requested to perform self care tasks prior and also clean her room. Pt performed supine<>sit with supervision, sit<>stand with supervision without AD from lowered EOB height. Pt often becoming frustrated with PT as therapist was providing close supervision. Pt ambulated in her room with supervision and no AD, picking up clothes from floor, organizing bed/linen, and unplugging her phone. She went to the bathroom where she was continent of voiding. She brushed her teeth at the sink with supervision and also washed her face with washrag with supervision. Pt without LOB. Pt ambulated >360f with supervision and RW in hallways, demo's decreased L weight shift and decreased L step length. Pt initially wearing sandals and pt was educated on importance of proper shoe wear (tennis shoes) for foot stability and to reduce falls risk; pt states that shoes make her to hott and she won't wear them but was agreeable to hospital non-slid socks. Pt negotiated up/down x4 steps with B HR support and supervision, step-through pattern, no LOB noted. Pt ambulated to NuStep machine where she performed for x8 minutes at workload of 5, reported 7/20 on the RPE scale at rest, progressing to 14/20 after completion. Pt then ambulated  from day room gym to car simulator with RW where she performed with supervision via lateral side-step method, therapist cueing for safety approach and sequencing. Pt requesting toilet use where she then ambulated to her room, was indep with toileting. Pt returned to therapy gym where she performed standing toe taps on 6" block 2x20 reps and unsupported standing on blue airex foam pad with feet apart eyes open/closed and feet together eyes open/closed. Pt required minA with eyes closed due to posterior lean. Pt requesting to go outside for fresh air so she ambulated from therapy gym outside (via [employer]) near [employer] She ambulated on unlevel brick pavers and negotiated up/down moderate ramped incline all with supervision and RW. Pt reports feeling relieved and happy with being outside. Pt was able to return to her room with RW and supervision without a seated rest break where she ended session seated in recliner, needs in reach, all needs met.   Therapy Documentation Precautions:  Precautions Precautions: Fall Precaution Comments: Mild Lt hemi Restrictions Weight Bearing Restrictions: No Pain: Pain Assessment Pain Scale: 0-10 Pain Score: 0-No pain   Therapy/Group: Individual Therapy  Lorraina Spring P Marthann Abshier PT 04/11/2020, 12:08 PM

## 2020-04-11 NOTE — Progress Notes (Signed)
Occupational Therapy Note  Patient Details  Name: Roberta Calderon MRN: [id number] Date of Birth: Jan 19, 1971  Today's Date: 04/11/2020 OT Missed Time: 79 Minutes Missed Time Reason: Patient fatigue  Pt scheduled for 830 am therapy. Pt received in bed sleeping but awoke easily.  She stated she was too tired to participate as she was up all night. She rapid response evaluate her due to chest pain/ high blood pressure.  Encouraged pt to take a warm shower and lay back down to nap prior to PT, but pt did not want to get out of bed.     [place] 04/11/2020, 8:42 AM

## 2020-04-11 NOTE — Progress Notes (Signed)
Inpatient Rehabilitation  Patient information reviewed and entered into eRehab system by Jovani Colquhoun M. Parminder Trapani, M.A., CCC/SLP, PPS Coordinator.  Information including medical coding, functional ability and quality indicators will be reviewed and updated through discharge.    

## 2020-04-11 NOTE — IPOC Note (Signed)
Overall Plan of Care [facility]) Patient Details Name: Roberta Calderon MRN: [id number] DOB: 07-09-1971  Admitting Diagnosis: Right thalamic infarction [facility])  Hospital Problems: Principal Problem:   Right thalamic infarction [facility])     Functional Problem List: Nursing Motor, Pain, Safety  PT Balance, Safety, Behavior, Sensory, Endurance, Skin Integrity, Motor, Nutrition, Pain  OT Balance, Safety, Endurance, Motor  SLP    TR         Basic ADL's: OT Grooming, Bathing, Dressing, Toileting     Advanced  ADL's: OT Light Housekeeping     Transfers: PT Bed Mobility, Bed to Chair, Car, [employer], Futures trader, Metallurgist: PT Ambulation, Emergency planning/management officer, Stairs     Additional Impairments: OT None  SLP None      TR      Anticipated Outcomes Item Anticipated Outcome  Self Feeding No goal  Swallowing      Basic self-care  Mod I  Toileting  Mod I   Bathroom Transfers Mod I  Bowel/Bladder  Independence  Transfers  Mod I transfers with LRAD  Locomotion  Mod I gait with LRAD. superivsion assist stair management.  Communication     Cognition     Pain  3 or less.  Safety/Judgment  No falls during admission   Therapy Plan: PT Intensity: Minimum of 1-2 x/day ,45 to 90 minutes PT Frequency: 5 out of 7 days PT Duration Estimated Length of Stay: 7-10 days OT Intensity: Minimum of 1-2 x/day, 45 to 90 minutes OT Frequency: 5 out of 7 days OT Duration/Estimated Length of Stay: 7-10 days     Due to the current state of emergency, patients may not be receiving their 3-hours of Medicare-mandated therapy.   Team Interventions: Nursing Interventions Patient/Family Education, Bowel Management, Disease Management/Prevention, Pain Management, Psychosocial Support, Discharge Planning  PT interventions Ambulation/gait training, Community reintegration, DME/adaptive equipment instruction, Neuromuscular re-education, Psychosocial support, Stair training,  UE/LE Strength taining/ROM, Wheelchair propulsion/positioning, UE/LE Coordination activities, Therapeutic Activities, Skin care/wound management, Functional electrical stimulation, Pain management, Training and development officer, Discharge planning, Cognitive remediation/compensation, Disease management/prevention, Patient/family education, Splinting/orthotics, Therapeutic Exercise, Visual/perceptual remediation/compensation, Functional mobility training  OT Interventions Balance/vestibular training, Discharge planning, Pain management, Self Care/advanced ADL retraining, Therapeutic Activities, UE/LE Coordination activities, Therapeutic Exercise, Patient/family education, Functional mobility training, Disease mangement/prevention, Community reintegration, Engineer, drilling, Neuromuscular re-education, Psychosocial support, UE/LE Strength taining/ROM  SLP Interventions    TR Interventions    SW/CM Interventions Discharge Planning, Psychosocial Support, Patient/Family Education   Barriers to Discharge MD  Home enviroment access/loayout, Lack of/limited family support, Insurance for SNF coverage, Medication compliance, Behavior and perseverating on disability/ no insurance currently  Nursing      PT Inaccessible home environment, Decreased caregiver support, Home environment access/layout, Lack of/limited family support, Insurance for SNF coverage, Medication compliance    OT Other (comments) Pt asking about transitional housing options, is she really d/c home with mother? Per chart, she was d/c to brother's home  SLP      SW Medication compliance     Team Discharge Planning: Destination: PT-Home ,OT- Home , SLP-Home Projected Follow-up: PT-Home health PT, OT-  Home health OT, SLP-None Projected Equipment Needs: PT-To be determined, Rolling walker with 5" wheels, Cane, OT- To be determined, SLP-None recommended by SLP Equipment Details: PT- , OT-  Patient/family involved in  discharge planning: PT- Patient,  OT-Patient, SLP-Patient  MD ELOS: 7-10 days Medical Rehab Prognosis:  Good Assessment: Pt is a 49 yr old female with polysubstance abuse  including cocaine admited with R brain stroke- parietal, thalamic, and basal ganglia- with L hemiparesis.  Her BP is also better controlled with medication mgmt.    Goals Mod I in 7-10 days   See Team Conference Notes for weekly updates to the plan of care

## 2020-04-12 ENCOUNTER — Inpatient Hospital Stay (HOSPITAL_COMMUNITY): Payer: Self-pay

## 2020-04-12 ENCOUNTER — Inpatient Hospital Stay (HOSPITAL_COMMUNITY): Payer: Self-pay | Admitting: Occupational Therapy

## 2020-04-12 DIAGNOSIS — R7303 Prediabetes: Secondary | ICD-10-CM

## 2020-04-12 DIAGNOSIS — E785 Hyperlipidemia, unspecified: Secondary | ICD-10-CM

## 2020-04-12 DIAGNOSIS — I69354 Hemiplegia and hemiparesis following cerebral infarction affecting left non-dominant side: Principal | ICD-10-CM

## 2020-04-12 DIAGNOSIS — I10 Essential (primary) hypertension: Secondary | ICD-10-CM

## 2020-04-12 MED ORDER — NICOTINE 7 MG/24HR TD PT24: MEDICATED_PATCH | TRANSDERMAL | 0 refills | Status: DC

## 2020-04-12 MED ORDER — CLOPIDOGREL BISULFATE 75 MG PO TABS: 75.0000 mg | ORAL_TABLET | Freq: Every day | ORAL | 2 refills | Status: AC

## 2020-04-12 MED ORDER — LISINOPRIL 5 MG PO TABS: 5.0000 mg | ORAL_TABLET | Freq: Every day | ORAL | 0 refills | Status: DC

## 2020-04-12 MED ORDER — AMLODIPINE BESYLATE 10 MG PO TABS: 10.0000 mg | ORAL_TABLET | Freq: Every day | ORAL | 2 refills | Status: DC

## 2020-04-12 MED ORDER — ACETAMINOPHEN 325 MG PO TABS: 650.0000 mg | ORAL_TABLET | ORAL | Status: DC | PRN

## 2020-04-12 MED ORDER — ATORVASTATIN CALCIUM 80 MG PO TABS: 80.0000 mg | ORAL_TABLET | Freq: Every day | ORAL | 2 refills | Status: DC

## 2020-04-12 NOTE — Progress Notes (Signed)
Occupational Therapy Session Note  Patient Details  Name: Roberta Calderon MRN: [id number] Date of Birth: Aug 02, 1971  Today's Date: 04/12/2020 OT Individual Time: 1100-1155 OT Individual Time Calculation (min): 55 min    Short Term Goals: Week 1:  OT Short Term Goal 1 (Week 1): STGs=LTGs due to ELOS  Skilled Therapeutic Interventions/Progress Updates:    Pt received in bed alert and ready for therapy. She was anxious to get up and get walking as she felt laying in the bed was making her feel weaker.  Pt stated she got fully bathed and dressed this morning not needing any help. Based on PT note and conversation with PT pt was able to accomplish all tasks at an independent level.  Pt is very private and did not want to be observed so completed self care in bathroom with door closed and PT providing distant S for safety. Based on this and pt's strong desire to go home ASAP, communicated with team about discharge for patient tomorrow.  Pt would like for this to happen.   In room, pt got out of bed, ambulated around room to gather her phone, made the bed all without S needed.  She ambulated with S for long distances of over 300 feet to gym and then to [place] and back just to ensure safety as her L leg is weaker than the R.    In the gym, worked on dynamic balance, endurance, and position changes with: - reaching ball to floor and then overhead in standing,   -Standing and doing PNF cross chops in both directions,  -standing with arm circles with arms at 90 sh flexion one set, and 90 sh abd 2nd set  No fatigue and pt completed all exercises well.  Discussed lifestyle changes with pt and she states she is very motivated as she has 5 grandchildren that she likes to spend time with.  She spoke with her daughter who said she could pick her up pending MD approval of her going home.   Pt walked to nursing station and soda provided to her.  Returned to room with all needs met.   Therapy  Documentation Precautions:  Precautions Precautions: Fall Precaution Comments: Mild Lt hemi Restrictions Weight Bearing Restrictions: No  Pain: Pain Assessment Pain Scale: 0-10 Pain Score: 0-No pain   ADL: ADL Eating: Independent Grooming: Independent Where Assessed-Grooming: Standing at sink Upper Body Bathing: Independent Where Assessed-Upper Body Bathing: Standing at sink Lower Body Bathing: Independent Where Assessed-Lower Body Bathing: Standing at sink Upper Body Dressing: Independent Where Assessed-Upper Body Dressing: Other (Comment) (bathroom) Lower Body Dressing: Independent Where Assessed-Lower Body Dressing: Other (Comment) (bathroom) Toileting: Independent Toilet Transfer: Modified independent Toilet Transfer Method: Counselling psychologist: Other (comment) (RW) Walk-In Shower Transfer: Distant supervision Social research officer, government Method: Ambulating (without AD) Youth worker:  (none)   Therapy/Group: Individual Therapy  [place] 04/12/2020, 12:28 PM

## 2020-04-12 NOTE — Progress Notes (Addendum)
[place] PHYSICAL MEDICINE & REHABILITATION PROGRESS NOTE   Subjective/Complaints: Patient seen laying in bed this morning.  She states he slept well overnight.  She feels she is getting stronger.  Discussed importance of anticoagulation, patient agreeable.  ROS: Denies CP, SOB, N/V/D  Objective:   No results found. Recent Labs    04/11/20 0810  WBC 6.5  HGB 14.0  HCT 44.4  PLT 325   Recent Labs    04/11/20 0810  NA 137  K 3.9  CL 103  CO2 22  GLUCOSE 157*  BUN 13  CREATININE 0.70  CALCIUM 9.5    Intake/Output Summary (Last 24 hours) at 04/12/2020 4098 Last data filed at 04/11/2020 1828 Gross per 24 hour  Intake 472 ml  Output --  Net 472 ml     Physical Exam: Vital Signs Blood pressure 122/79, pulse 84, temperature 97.8 F (36.6 C), resp. rate 18, height 5\' 9"  (1.753 m), weight 57.2 kg, SpO2 (!) 89 %. Constitutional: No distress . Vital signs reviewed. HENT: Normocephalic.  Atraumatic. Eyes: EOMI. No discharge. Cardiovascular: No JVD.  RRR. Respiratory: Normal effort.  No stridor.  Bilateral clear to auscultation. GI: Non-distended.  BS +. Skin: Warm and dry.  Intact. Psych: Normal mood.  Normal behavior. Musc: No edema in extremities.  No tenderness in extremities. Neuro: Alert Motor: RUE/RLE: 5/5 proximal distal LUE/LLE: 4+/5 proximal distal Dysarthria, improving  Assessment/Plan: 1. Functional deficits secondary to right thalamic infarction which require 3+ hours per day of interdisciplinary therapy in a comprehensive inpatient rehab setting.  Physiatrist is providing close team supervision and 24 hour management of active medical problems listed below.  Physiatrist and rehab team continue to assess barriers to discharge/monitor patient progress toward functional and medical goals  Care Tool:  Bathing    Body parts bathed by patient: Left arm, Right arm, Chest, Abdomen, Front perineal area, Buttocks, Right upper leg, Left upper leg, Right  lower leg, Left lower leg, Face         Bathing assist Assist Level: Contact Guard/Touching assist (simulated)     Upper Body Dressing/Undressing Upper body dressing   What is the patient wearing?: Bra, Pull over shirt    Upper body assist Assist Level: Set up assist    Lower Body Dressing/Undressing Lower body dressing    Lower body dressing activity did not occur: Refused       Lower body assist       Toileting Toileting    Toileting assist Assist for toileting: Supervision/Verbal cueing     Transfers Chair/bed transfer  Transfers assist     Chair/bed transfer assist level: Minimal Assistance - Patient > 75%     Locomotion Ambulation   Ambulation assist      Assist level: Supervision/Verbal cueing Assistive device: Walker-rolling Max distance: >352ft   Walk 10 feet activity   Assist     Assist level: Supervision/Verbal cueing Assistive device: Walker-rolling   Walk 50 feet activity   Assist    Assist level: Supervision/Verbal cueing Assistive device: Walker-rolling    Walk 150 feet activity   Assist Walk 150 feet activity did not occur: Safety/medical concerns  Assist level: Supervision/Verbal cueing Assistive device: Walker-rolling    Walk 10 feet on uneven surface  activity   Assist Walk 10 feet on uneven surfaces activity did not occur: Safety/medical concerns   Assist level: Supervision/Verbal cueing Assistive device: Aeronautical engineer Will patient use wheelchair at discharge?: No Type of Wheelchair:  Manual    Wheelchair assist level: Minimal Assistance - Patient > 75% Max wheelchair distance: 150    Wheelchair 50 feet with 2 turns activity    Assist        Assist Level: Minimal Assistance - Patient > 75%   Wheelchair 150 feet activity     Assist      Assist Level: Minimal Assistance - Patient > 75%   Blood pressure 122/79, pulse 84, temperature 97.8 F (36.6 C),  resp. rate 18, height 5\' 9"  (1.753 m), weight 57.2 kg, SpO2 (!) 89 %.  Medical Problem List and Plan: 1.  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  Antithrombotics: -DVT/anticoagulation: Lovenox-discussed compliance, patient agreeable  CBC within acceptable range on on 8/16             -antiplatelet therapy: Aspirin 81 mg daily and Plavix 75 mg daily x21 days then aspirin alone 3. Pain Management: Tylenol as needed. 4. Mood: Provide emotional support             -antipsychotic agents: N/A 5. Neuropsych: This patient is capable of making decisions on her own behalf. 6. Skin/Wound Care: Routine skin checks.  7. Fluids/Electrolytes/Nutrition: Routine in and outs.   8.  Hypertension.  Norvasc 10 mg daily.   Increase Lisinopril to 5mg .  Controlled on 8/17             Monitor the increased mobility. 9.  Hyperlipidemia.    Continue Lipitor 10.  Tobacco/alcohol and polysubstance abuse.  Urine drug screen positive cocaine.  Counsel 11.  Prediabetes.  Hemoglobin A1c 6.3.  Follow-up outpatient.             Blood glucose elevated on 8/16  Will consider changing diet to carb modified, however will maintain current diet for time being avoid additional stressor and desire for patient to be discharged  Monitor increase mobility 12. Chest pain: Resolved  LOS: 4 days A FACE TO FACE EVALUATION WAS PERFORMED  Cristyn Crossno Lorie Phenix 04/12/2020, 8:32 AM

## 2020-04-12 NOTE — Progress Notes (Signed)
Patient ID: Roberta Calderon, female   DOB: [birth year], 49 y.o.   MRN: [id number]  Met with pt to discuss team feels have met her goals and will be ready for discharge tomorrow. She is very pleased with this and is ready. Discussed no DME needs and will try charity home health for a brief time. Will work on [employer] and has a follow up appointment at [facility]. Will work on letter pt needs for work.

## 2020-04-12 NOTE — Discharge Instructions (Signed)
STROKE/TIA DISCHARGE INSTRUCTIONS SMOKING Cigarette smoking nearly doubles your risk of having a stroke & is the single most alterable risk factor  If you smoke or have smoked in the last 12 months, you are advised to quit smoking for your health.  Most of the excess cardiovascular risk related to smoking disappears within a year of stopping.  Ask you doctor about anti-smoking medications  [place] Quit Line: 1-800-QUIT NOW  Free Smoking Cessation Classes [phone]  CHOLESTEROL Know your levels; limit fat & cholesterol in your diet  Lipid Panel     Component Value Date/Time   CHOL 254 (H) 04/05/2020 1005   TRIG 95 04/05/2020 1005   HDL 62 04/05/2020 1005   CHOLHDL 4.1 04/05/2020 1005   VLDL 19 04/05/2020 1005   LDLCALC 173 (H) 04/05/2020 1005      Many patients benefit from treatment even if their cholesterol is at goal.  Goal: Total Cholesterol (CHOL) less than 160  Goal:  Triglycerides (TRIG) less than 150  Goal:  HDL greater than 40  Goal:  LDL (LDLCALC) less than 100   BLOOD PRESSURE American Stroke Association blood pressure target is less that 120/80 mm/Hg  Your discharge blood pressure is:  BP: (!) 142/93  Monitor your blood pressure  Limit your salt and alcohol intake  Many individuals will require more than one medication for high blood pressure  DIABETES (A1c is a blood sugar average for last 3 months) Goal HGBA1c is under 7% (HBGA1c is blood sugar average for last 3 months)  Diabetes: No known diagnosis of diabetes    Lab Results  Component Value Date   HGBA1C 6.3 (H) 04/05/2020     Your HGBA1c can be lowered with medications, healthy diet, and exercise.  Check your blood sugar as directed by your physician  Call your physician if you experience unexplained or low blood sugars.  PHYSICAL ACTIVITY/REHABILITATION Goal is 30 minutes at least 4 days per week  Activity: Increase activity slowly, Therapies: Physical Therapy: Home Health Return to work:    Activity decreases your risk of heart attack and stroke and makes your heart stronger.  It helps control your weight and blood pressure; helps you relax and can improve your mood.  Participate in a regular exercise program.  Talk with your doctor about the best form of exercise for you (dancing, walking, swimming, cycling).  DIET/WEIGHT Goal is to maintain a healthy weight  Your discharge diet is:  Diet Order            Diet Heart Room service appropriate? Yes; Fluid consistency: Thin  Diet effective now                 liquids Your height is:  Height: 5\' 9"  (175.3 cm) Your current weight is: Weight: 57.2 kg Your Body Mass Index (BMI) is:  BMI (Calculated): 18.61  Following the type of diet specifically designed for you will help prevent another stroke.  Your goal weight range is:    Your goal Body Mass Index (BMI) is 19-24.  Healthy food habits can help reduce 3 risk factors for stroke:  High cholesterol, hypertension, and excess weight.  RESOURCES Stroke/Support Group:  Call [phone]   STROKE EDUCATION PROVIDED/REVIEWED AND GIVEN TO PATIENT Stroke warning signs and symptoms How to activate emergency medical system (call 911). Medications prescribed at discharge. Need for follow-up after discharge. Personal risk factors for stroke. Pneumonia vaccine given:  Flu vaccine given:  My questions have been answered, the writing is  legible, and I understand these instructions.  I will adhere to these goals & educational materials that have been provided to me after my discharge from the hospital.   Inpatient Rehab Discharge Instructions  KAIRY FOLSOM Discharge date and time: No discharge date for patient encounter.   Activities/Precautions/ Functional Status: Activity: As tolerated Diet: Regular Wound Care: Routine skin checks Functional status:  ___ No restrictions     ___ Walk up steps independently ___ 24/7 supervision/assistance   ___ Walk up steps with  assistance ___ Intermittent supervision/assistance  ___ Bathe/dress independently ___ Walk with walker     _x__ Bathe/dress with assistance ___ Walk Independently    ___ Shower independently ___ Walk with assistance    ___ Shower with assistance ___ No alcohol     ___ Return to work/school ________  Special Instructions: No driving smoking or alcohol   COMMUNITY REFERRALS UPON DISCHARGE:    Home Health:   PT                  Agency: Phone:   Outpatient: PT             Agency: Phone:              Appointment Date/Time:  Medical Equipment/Items Ordered:No needs                                                  Offered substance abuse resources pt declined them Match given to pt for prescription assistance  STROKE/TIA DISCHARGE INSTRUCTIONS SMOKING Cigarette smoking nearly doubles your risk of having a stroke & is the single most alterable risk factor  If you smoke or have smoked in the last 12 months, you are advised to quit smoking for your health.  Most of the excess cardiovascular risk related to smoking disappears within a year of stopping.  Ask you doctor about anti-smoking medications  [place] Quit Line: 1-800-QUIT NOW  Free Smoking Cessation Classes [phone]  CHOLESTEROL Know your levels; limit fat & cholesterol in your diet  Lipid Panel     Component Value Date/Time   CHOL 254 (H) 04/05/2020 1005   TRIG 95 04/05/2020 1005   HDL 62 04/05/2020 1005   CHOLHDL 4.1 04/05/2020 1005   VLDL 19 04/05/2020 1005   LDLCALC 173 (H) 04/05/2020 1005      Many patients benefit from treatment even if their cholesterol is at goal.  Goal: Total Cholesterol (CHOL) less than 160  Goal:  Triglycerides (TRIG) less than 150  Goal:  HDL greater than 40  Goal:  LDL (LDLCALC) less than 100   BLOOD PRESSURE American Stroke Association blood pressure target is less that 120/80 mm/Hg  Your discharge blood pressure is:  BP: (!) 150/89  Monitor your blood pressure  Limit your salt  and alcohol intake  Many individuals will require more than one medication for high blood pressure  DIABETES (A1c is a blood sugar average for last 3 months) Goal HGBA1c is under 7% (HBGA1c is blood sugar average for last 3 months)  Diabetes:     Lab Results  Component Value Date   HGBA1C 6.3 (H) 04/05/2020     Your HGBA1c can be lowered with medications, healthy diet, and exercise.  Check your blood sugar as directed by your physician  Call your physician if you experience unexplained or  low blood sugars.  PHYSICAL ACTIVITY/REHABILITATION Goal is 30 minutes at least 4 days per week  Activity: As tolerated Therapies: Home therapies Return to work:   Activity decreases your risk of heart attack and stroke and makes your heart stronger.  It helps control your weight and blood pressure; helps you relax and can improve your mood.  Participate in a regular exercise program.  Talk with your doctor about the best form of exercise for you (dancing, walking, swimming, cycling).  DIET/WEIGHT Goal is to maintain a healthy weight  Your discharge diet is:  Diet Order            Diet Heart Room service appropriate? Yes; Fluid consistency: Thin  Diet effective now                 liquids Your height is:  Height: 5\' 9"  (175.3 cm) Your current weight is: Weight: 57.2 kg Your Body Mass Index (BMI) is:  BMI (Calculated): 18.61  Following the type of diet specifically designed for you will help prevent another stroke.  Your goal weight range is:    Your goal Body Mass Index (BMI) is 19-24.  Healthy food habits can help reduce 3 risk factors for stroke:  High cholesterol, hypertension, and excess weight.  RESOURCES Stroke/Support Group:  Call [phone]   STROKE EDUCATION PROVIDED/REVIEWED AND GIVEN TO PATIENT Stroke warning signs and symptoms How to activate emergency medical system (call 911). Medications prescribed at discharge. Need for follow-up after discharge. Personal risk  factors for stroke. Pneumonia vaccine given:  Flu vaccine given:  My questions have been answered, the writing is legible, and I understand these instructions.  I will adhere to these goals & educational materials that have been provided to me after my discharge from the hospital.      My questions have been answered and I understand these instructions. I will adhere to these goals and the provided educational materials after my discharge from the hospital.  Patient/Caregiver Signature _______________________________ Date __________  Clinician Signature _______________________________________ Date __________  Please bring this form and your medication list with you to all your follow-up doctor's appointments.

## 2020-04-12 NOTE — Progress Notes (Signed)
Inpatient Rehabilitation Care Coordinator  Discharge Note  The overall goal for the admission was met for:   Discharge location: Yes-Going to Mom's home and have intermittent assist  Length of Stay: Yes-5 days  Discharge activity level: Yes-mod/i-supervision level  Home/community participation: Yes  Services provided included: MD, RD, PT, OT, RN, CM, Pharmacy, Neuropsych and SW  Financial Services: Other: self pay  Follow-up services arranged: Home Health: [place]  and Patient/Family has no preference for HH/DME agencies  Comments (or additional information):Match given to pt for prescription assistance. Letter for work and Disability application given to pt to follow up with  Patient/Family verbalized understanding of follow-up arrangements: Yes  Individual responsible for coordination of the follow-up plan: Self and Lyvonne-Mom [phone]  Confirmed correct DME delivered: [name] 04/12/2020    Jadiel Schmieder, [place]

## 2020-04-12 NOTE — Progress Notes (Signed)
Physical Therapy Session Note  Patient Details  Name: Roberta Calderon MRN: [id number] Date of Birth: 1971-07-03  Today's Date: 04/12/2020 PT Individual Time: 0900-0957 PT Individual Time Calculation (min): 57 min   Short Term Goals: Week 1:  PT Short Term Goal 1 (Week 1): STG=LTG due to ELOS  Skilled Therapeutic Interventions/Progress Updates:     Pt received R sidelying in bed, sleeping, awakens easily to voice. Pt requiring extra time for acclimating to waking up but was agreeable to PT session. Pt denies pain. Pt requesting to "get ready" prior to ambulating out of her room. Pt performed supine<>sit indep without bed features and sit<>stand with supervision and no AD. She ambulated in her room with supervision and no AD, performing various tasks such as general item organization, picking objects off floor, washed her face at sink, brushed her teeth while ambulating, continent of voiding at toilet, and performed lower/upper body dressing with distance supervision behind closed door (pt is very modest, requesting privacy). Pt ambulated with supervision and no AD in hallways to therapy gym where she performed the BERG balance test, scoring 53/56 which is an 11 point improvement from 3 days ago where she scored 42/56. Pt requesting to use bathroom so she ambulated back to her room with supervision to perform toileting indep where she had a BM. Afterwards, pt ambulated to [facility] bridge (to limit distractions) where she performed the DGI, scoring 22/24 which is a 4 point improvement from 3 days ago where she scored 18/24 (<19 indicated increased falls risk). Pt then requesting to ambulate outside so she returned to her room for shoewear and then ambulated from her room to outside main entrance with supervision and no AD, noted mild L foot drag which fatigue but she was able to improve this with gentle cueing. Once outside, pt practice ambulated on unlevel surfaces and navigating environmental  barriers which she performed with supervision. Seated rest break outside where pt reports feeling very refreshed and thankful for spending time outside. Pt ambulated back to her room with supervision and no AD and ended session seated EOB eating her cereal, bed alarm on, needs in reach.  Patient demonstrates increased fall risk as noted by score of   53/56 on Berg Balance Scale.  (<36= high risk for falls, close to 100%; 37-45 significant >80%; 46-51 moderate >50%; 52-55 lower >25%)  Therapy Documentation Precautions:  Precautions Precautions: Fall Precaution Comments: Mild Lt hemi Restrictions Weight Bearing Restrictions: No Pain: Pain Assessment Pain Scale: 0-10 Pain Score: 0-No pain  Balance: Balance Balance Assessed: Yes Standardized Balance Assessment Standardized Balance Assessment: Berg Balance Test;Dynamic Gait Index Berg Balance Test Sit to Stand: Able to stand without using hands and stabilize independently Standing Unsupported: Able to stand safely 2 minutes Sitting with Back Unsupported but Feet Supported on Floor or Stool: Able to sit safely and securely 2 minutes Stand to Sit: Sits safely with minimal use of hands Transfers: Able to transfer safely, minor use of hands Standing Unsupported with Eyes Closed: Able to stand 10 seconds safely Standing Ubsupported with Feet Together: Able to place feet together independently and stand 1 minute safely From Standing, Reach Forward with Outstretched Arm: Can reach forward >12 cm safely (5") From Standing Position, Pick up Object from Floor: Able to pick up shoe safely and easily From Standing Position, Turn to Look Behind Over each Shoulder: Looks behind from both sides and weight shifts well Turn 360 Degrees: Able to turn 360 degrees safely in 4 seconds or  less Standing Unsupported, Alternately Place Feet on Step/Stool: Able to complete 4 steps without aid or supervision Standing Unsupported, One Foot in Front: Able to place  foot tandem independently and hold 30 seconds Standing on One Leg: Able to lift leg independently and hold > 10 seconds Total Score: 53  Dynamic Gait Index Level Surface: Normal Change in Gait Speed: Normal Gait with Horizontal Head Turns: Normal Gait with Vertical Head Turns: Normal Gait and Pivot Turn: Normal Step Over Obstacle: Mild Impairment Step Around Obstacles: Normal Steps: Mild Impairment Total Score: 22  Therapy/Group: Individual Therapy  Jone Baseman Isyss Espinal PT, DPT 04/12/2020, 10:03 AM

## 2020-04-12 NOTE — Discharge Summary (Signed)
Physical Therapy Discharge Summary  Patient Details  Name: Roberta Calderon MRN: [id number] Date of Birth: 12/22/1970  Today's Date: 04/12/2020 PT Individual Time: 1300-1415 PT Individual Time Calculation (min): 75 min    Patient has met 10 of 10 long term goals due to improved activity tolerance, improved balance, improved postural control, increased strength, ability to compensate for deficits, functional use of  left lower extremity, improved attention, improved awareness and improved coordination.  Patient to discharge at an ambulatory level Independent.   Patient's care partner is independent to provide the necessary physical and cognitive assistance at discharge.   Recommendation:  Patient will benefit from ongoing skilled PT services in outpatient setting to continue to advance safe functional mobility, address ongoing impairments in LLE weakness, balance, and gait deficits in order to minimize fall risk.  Equipment: No equipment provided  Reasons for discharge: treatment goals met  Patient/family agrees with progress made and goals achieved: Yes  PT Discharge Precautions/Restrictions Precautions Precautions: None Precaution Comments: mild L hemi Restrictions Weight Bearing Restrictions: No Vital Signs Therapy Vitals Temp: 97.8 F (36.6 C) Pulse Rate: 70 Resp: 16 BP: 138/80 Patient Position (if appropriate): Lying Oxygen Therapy SpO2: 99 % O2 Device: Room Air Pain   Vision/Perception  Perception Perception: Within Functional Limits Praxis Praxis: Intact  Cognition Overall Cognitive Status: Within Functional Limits for tasks assessed Arousal/Alertness: Awake/alert Attention: Focused;Sustained;Selective;Alternating Focused Attention: Appears intact Sustained Attention: Appears intact Sustained Attention Impairment: Verbal basic;Functional basic Selective Attention: Appears intact Selective Attention Impairment: Verbal basic;Functional basic Alternating  Attention: Appears intact Memory: Appears intact Problem Solving: Appears intact Problem Solving Impairment: Verbal basic Behaviors: Restless Safety/Judgment: Appears intact Sensation Sensation Light Touch: Appears Intact Proprioception: Appears Intact Coordination Gross Motor Movements are Fluid and Coordinated: Yes Fine Motor Movements are Fluid and Coordinated: Yes Motor  Motor Motor: Hemiplegia Motor - Skilled Clinical Observations: mild L hemiplegia  Mobility Bed Mobility Bed Mobility: Rolling Right;Rolling Left;Sit to Supine;Supine to Sit Rolling Right: Independent Rolling Left: Independent Supine to Sit: Independent Sit to Supine: Independent Transfers Transfers: Sit to Stand;Stand to Sit;Stand Pivot Transfers Sit to Stand: Independent Stand to Sit: Independent Stand Pivot Transfers: Independent Transfer (Assistive device): None Locomotion  Gait Ambulation: Yes Gait Assistance: Independent Gait Distance (Feet): 2000 Feet Assistive device: None Gait Gait: Yes Gait Pattern: Within Functional Limits Gait Pattern: Decreased weight shift to left;Decreased hip/knee flexion - left;Decreased step length - right;Poor foot clearance - left Gait velocity: decreased High Level Ambulation High Level Ambulation: Side stepping Side Stepping: Lateral side stepping over cones L<>R, increased difficulty with LLE > RLE Stairs / Additional Locomotion Stairs: Yes Stairs Assistance: Independent Stair Management Technique: One rail Right Number of Stairs: 18 Height of Stairs: 6 (inch) Ramp: Independent Wheelchair Mobility Wheelchair Mobility: No  Trunk/Postural Assessment  Cervical Assessment Cervical Assessment: Within Functional Limits Thoracic Assessment Thoracic Assessment: Within Functional Limits Lumbar Assessment Lumbar Assessment: Within Functional Limits Postural Control Postural Control: Within Functional Limits  Balance Balance Balance Assessed:  Yes Standardized Balance Assessment Standardized Balance Assessment: Berg Balance Test;Dynamic Gait Index Berg Balance Test Sit to Stand: Able to stand without using hands and stabilize independently Standing Unsupported: Able to stand safely 2 minutes Sitting with Back Unsupported but Feet Supported on Floor or Stool: Able to sit safely and securely 2 minutes Stand to Sit: Sits safely with minimal use of hands Transfers: Able to transfer safely, minor use of hands Standing Unsupported with Eyes Closed: Able to stand 10 seconds safely Standing Ubsupported with Feet Together:  Able to place feet together independently and stand 1 minute safely From Standing, Reach Forward with Outstretched Arm: Can reach forward >12 cm safely (5") From Standing Position, Pick up Object from Floor: Able to pick up shoe safely and easily From Standing Position, Turn to Look Behind Over each Shoulder: Looks behind from both sides and weight shifts well Turn 360 Degrees: Able to turn 360 degrees safely in 4 seconds or less Standing Unsupported, Alternately Place Feet on Step/Stool: Able to complete 4 steps without aid or supervision Standing Unsupported, One Foot in Front: Able to place foot tandem independently and hold 30 seconds Standing on One Leg: Able to lift leg independently and hold > 10 seconds Total Score: 53 Dynamic Gait Index Level Surface: Normal Change in Gait Speed: Normal Gait with Horizontal Head Turns: Normal Gait with Vertical Head Turns: Normal Gait and Pivot Turn: Normal Step Over Obstacle: Mild Impairment Step Around Obstacles: Normal Steps: Normal Total Score: 23 Extremity Assessment  RUE Assessment RUE Assessment: Within Functional Limits LUE Assessment LUE Assessment: Within Functional Limits RLE Assessment RLE Assessment: Within Functional Limits LLE Assessment LLE Assessment: Within Functional Limits General Strength Comments: Grossly 4+/5  Skilled intervention: Pt  received supine in bed, agreeable to PT session. Pt denies pain and reports eagerness to DC home. Pt informed of team decision of updated DC plan tomorrow due to significant progress during rehabilitation and meeting all of her goals, pt appeared very happy. Pt performed supine<>sit indep with HOB flat and no rails, sit<>stand indep with no AD from EOB height. Pt ambulated indep with no AD in her room gathering various items prior to leaving to therapy gym. Pt ambulated hallway distances throughout session indep with no AD, does not report fatigue, demo's decreased L step length/height, no buckling or LOB throughout. Pt negotiated up/down x12 steps indep with B HR support, demo's reciprocal pattern. Pt performed car transfer indep via lateral stepping method, demo's safe and appropriate technique. Pt then ambulated up/down ramp indep without HR support, demo's appropriate safety approach and no difficulty noted with ascent or descent. Pt performed tub transfer x3 reps (initially requiring supervision, progressing to indep) via lateral stepping method, no HR's used for support. Pt performed nustep x8 minutes with workload of 3, reports 7/10 RPE towards end and recovers quickly to 2/10 with brief seated rest. Pt performed lateral side stepping over 4x7x7 cones L<>R and 4x7 cones forward stepping over cones focusing on LLE hip/knee flexion, therapist provided SBA for safety, cues for sequencing and technique, noted increased difficulty with LLE lateral stepping vs RLE. Pt performed stand<>floor transfers on soft mat with supervision, therapist providing cues for sequencing and safety. Educated patient on what to do if she experiences a fall (check limbs, check head, assess situation, call for help if needed, crawl to stable furniture to assist with floor<>sit). Pt performed TUG, scoring 9.16sec, 8.47sec, and 8.44sec, averaging 8.69seconds which is an improvement from her score of 17seconds on evaluation. Pt requesting  to ambulate in an unfamiliar environment, so we ambulated >206f throughout hospital where she also navigated up/down x18 steps with U HR support. Pt without standing or seated rest break throughout and denies shortness of breath or fatigue. Pt ended session sitting EOB, needs in reach. All questions/concerns addressed for DC plan, pt in agreement.  Mazelle Huebert P Donovon Micheletti PT, DPT 04/12/2020, 3:41 PM

## 2020-04-12 NOTE — Discharge Summary (Addendum)
Physician Discharge Summary  Patient ID: Roberta Calderon MRN: [id number] DOB/AGE: [birth year] 49 y.o.  Admit date: 04/08/2020 Discharge date: 12/13/2019  Discharge Diagnoses:  Principal Problem:   Right thalamic infarction [facility]) Active Problems:   Dyslipidemia DVT prophylaxis Hypertension Hyperlipidemia Tobacco and alcohol use as well as polysubstance Prediabetes  Discharged Condition: Stable  Significant Diagnostic Studies: CT Code Stroke CTA Head W/WO contrast  Result Date: 04/06/2020 CLINICAL DATA:  Neuro deficit, acute, stroke suspected. Additional history provided: Patient presents with weakness to left side for 1 week, worsening, left arm with weak grip strength, drift to the left. EXAM: CT ANGIOGRAPHY HEAD AND NECK TECHNIQUE: Multidetector CT imaging of the head and neck was performed using the standard protocol during bolus administration of intravenous contrast. Multiplanar CT image reconstructions and MIPs were obtained to evaluate the vascular anatomy. Carotid stenosis measurements (when applicable) are obtained utilizing NASCET criteria, using the distal internal carotid diameter as the denominator. CONTRAST:  57mL OMNIPAQUE IOHEXOL 350 MG/ML SOLN COMPARISON:  Head CT 04/05/2020 FINDINGS: CT HEAD FINDINGS Brain: Redemonstrated ischemic infarction changes within the right thalamus and thalamocapsular junction, possibly subacute. Redemonstrated age-indeterminate lacunar infarcts within the basal ganglia bilaterally. Unchanged chronic lacunar infarct within the left thalamus and paramedian posterior right pons. Mild background ill-defined hypoattenuation within cerebral white matter is advanced for age and nonspecific, but consistent with chronic small vessel ischemic disease. There is no acute intracranial hemorrhage. No demarcated cortical infarct is identified. No extra-axial fluid collection. No evidence of intracranial mass. No midline shift. Vascular: Reported below. Skull:  Normal. Negative for fracture or focal lesion. Sinuses: No significant paranasal sinus disease or mastoid effusion Orbits: No acute orbital abnormality identified. Review of the MIP images confirms the above findings CTA NECK FINDINGS Aortic arch: Standard aortic branching. Minimal calcified plaque within the visualized aortic arch and proximal major branch vessels of the neck. No hemodynamically significant innominate or proximal subclavian artery stenosis. Right carotid system: CCA and ICA patent within the neck without significant stenosis (50% or greater). Mild calcified plaque at the carotid bifurcation. Left carotid system: CCA and ICA patent within the neck without significant stenosis (50% or greater). Mild calcified plaque at the carotid bifurcation. Vertebral arteries: Codominant and patent within the neck without significant stenosis. Skeleton: No acute bony abnormality or aggressive osseous lesion. Advanced for age cervical spondylosis with multilevel disc space narrowing, posterior disc osteophytes, uncovertebral and facet hypertrophy. Other neck: No neck mass or cervical lymphadenopathy Upper chest: No consolidation within the imaged lung apices. Review of the MIP images confirms the above findings CTA HEAD FINDINGS Anterior circulation: The intracranial internal carotid arteries are patent. Calcified plaque within both vessels with no more than mild stenosis. The M1 middle cerebral arteries are patent without significant stenosis. No M2 proximal branch occlusion or high-grade proximal stenosis is identified. The anterior cerebral arteries are patent. No intracranial aneurysm is identified. Posterior circulation: The intracranial vertebral arteries are patent. The basilar artery is patent. The posterior cerebral arteries are patent. Posterior communicating arteries are hypoplastic or absent bilaterally. Venous sinuses: Within limitations of contrast timing, no convincing thrombus. Anatomic variants: As  described Review of the MIP images confirms the above findings IMPRESSION: CT head: 1. Redemonstrated ischemic infarction changes within the right thalamus and thalamocapsular junction, possibly subacute. Consider brain MRI for further evaluation. 2. Redemonstrated age-indeterminate lacunar infarcts within the basal ganglia bilaterally. 3. 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 4. Mild background cerebral white matter chronic small vessel ischemic disease, which is  advanced for age. CTA neck: 1. The common carotid, internal carotid and vertebral arteries are patent within the neck without hemodynamically significant stenosis. Mild calcified plaque within the visualized aortic arch, proximal major branch vessels of the neck and carotid bifurcations. CTA head: 1. No intracranial large vessel occlusion or proximal high-grade arterial stenosis. 2. Mild calcified plaque within both intracranial internal carotid arteries with no more than mild stenosis. Electronically Signed   By: Kellie Simmering DO   On: 04/06/2020 13:18   CT HEAD WO CONTRAST  Result Date: 04/05/2020 CLINICAL DATA:  Left-sided weakness for 1 week EXAM: CT HEAD WITHOUT CONTRAST TECHNIQUE: Contiguous axial images were obtained from the base of the skull through the vertex without intravenous contrast. COMPARISON:  None. FINDINGS: Brain: Scattered areas of decreased attenuation are noted involving the right thalamus, right basal ganglia and right deep white matter in the parietal lobe consistent with acute to subacute ischemia. No significant mass effect is noted. No focal mass lesion is seen. No acute hemorrhage is noted. Vascular: No hyperdense vessel or unexpected calcification. Skull: Normal. Negative for fracture or focal lesion. Sinuses/Orbits: No acute finding. Other: None. IMPRESSION: Scattered areas of decreased attenuation on the right as described consistent with likely subacute ischemia given the clinical history.  Electronically Signed   By: Inez Catalina M.D.   On: 04/05/2020 10:45   CT Code Stroke CTA Neck W/WO contrast  Result Date: 04/06/2020 CLINICAL DATA:  Neuro deficit, acute, stroke suspected. Additional history provided: Patient presents with weakness to left side for 1 week, worsening, left arm with weak grip strength, drift to the left. EXAM: CT ANGIOGRAPHY HEAD AND NECK TECHNIQUE: Multidetector CT imaging of the head and neck was performed using the standard protocol during bolus administration of intravenous contrast. Multiplanar CT image reconstructions and MIPs were obtained to evaluate the vascular anatomy. Carotid stenosis measurements (when applicable) are obtained utilizing NASCET criteria, using the distal internal carotid diameter as the denominator. CONTRAST:  70mL OMNIPAQUE IOHEXOL 350 MG/ML SOLN COMPARISON:  Head CT 04/05/2020 FINDINGS: CT HEAD FINDINGS Brain: Redemonstrated ischemic infarction changes within the right thalamus and thalamocapsular junction, possibly subacute. Redemonstrated age-indeterminate lacunar infarcts within the basal ganglia bilaterally. Unchanged chronic lacunar infarct within the left thalamus and paramedian posterior right pons. Mild background ill-defined hypoattenuation within cerebral white matter is advanced for age and nonspecific, but consistent with chronic small vessel ischemic disease. There is no acute intracranial hemorrhage. No demarcated cortical infarct is identified. No extra-axial fluid collection. No evidence of intracranial mass. No midline shift. Vascular: Reported below. Skull: Normal. Negative for fracture or focal lesion. Sinuses: No significant paranasal sinus disease or mastoid effusion Orbits: No acute orbital abnormality identified. Review of the MIP images confirms the above findings CTA NECK FINDINGS Aortic arch: Standard aortic branching. Minimal calcified plaque within the visualized aortic arch and proximal major branch vessels of the neck.  No hemodynamically significant innominate or proximal subclavian artery stenosis. Right carotid system: CCA and ICA patent within the neck without significant stenosis (50% or greater). Mild calcified plaque at the carotid bifurcation. Left carotid system: CCA and ICA patent within the neck without significant stenosis (50% or greater). Mild calcified plaque at the carotid bifurcation. Vertebral arteries: Codominant and patent within the neck without significant stenosis. Skeleton: No acute bony abnormality or aggressive osseous lesion. Advanced for age cervical spondylosis with multilevel disc space narrowing, posterior disc osteophytes, uncovertebral and facet hypertrophy. Other neck: No neck mass or cervical lymphadenopathy Upper chest: No consolidation within  the imaged lung apices. Review of the MIP images confirms the above findings CTA HEAD FINDINGS Anterior circulation: The intracranial internal carotid arteries are patent. Calcified plaque within both vessels with no more than mild stenosis. The M1 middle cerebral arteries are patent without significant stenosis. No M2 proximal branch occlusion or high-grade proximal stenosis is identified. The anterior cerebral arteries are patent. No intracranial aneurysm is identified. Posterior circulation: The intracranial vertebral arteries are patent. The basilar artery is patent. The posterior cerebral arteries are patent. Posterior communicating arteries are hypoplastic or absent bilaterally. Venous sinuses: Within limitations of contrast timing, no convincing thrombus. Anatomic variants: As described Review of the MIP images confirms the above findings IMPRESSION: CT head: 1. Redemonstrated ischemic infarction changes within the right thalamus and thalamocapsular junction, possibly subacute. Consider brain MRI for further evaluation. 2. Redemonstrated age-indeterminate lacunar infarcts within the basal ganglia bilaterally. 3. Unchanged chronic pontine and left  thalamic lacunar infarcts. 4. Mild background cerebral white matter chronic small vessel ischemic disease, which is advanced for age. CTA neck: 1. The common carotid, internal carotid and vertebral arteries are patent within the neck without hemodynamically significant stenosis. Mild calcified plaque within the visualized aortic arch, proximal major branch vessels of the neck and carotid bifurcations. CTA head: 1. No intracranial large vessel occlusion or proximal high-grade arterial stenosis. 2. Mild calcified plaque within both intracranial internal carotid arteries with no more than mild stenosis. Electronically Signed   By: Kellie Simmering DO   On: 04/06/2020 13:18   MR BRAIN WO CONTRAST  Result Date: 04/08/2020 CLINICAL DATA:  Initial evaluation for acute left-sided weakness for 1 week. EXAM: MRI HEAD WITHOUT CONTRAST TECHNIQUE: Multiplanar, multiecho pulse sequences of the brain and surrounding structures were obtained without intravenous contrast. COMPARISON:  Prior CTs from 04/05/2020 and 04/06/2020. FINDINGS: Brain: Examination is technically limited as the patient was unable to tolerate the exam. Axial and coronal DWI sequences only were performed. 1 cm focus of mild diffusion abnormality at the posterior limb of the right internal capsule. Associated ADC signal has likely resolved at this time. Given patient's symptoms, this most likely reflects a subacute ischemic infarct (series 5, image 59). No definite surrounding edema or mass effect. No secondary signs to suggest associated hemorrhage. No other evidence for acute or subacute ischemia. No other definite acute abnormality seen on this motion degraded exam. No visible encephalomalacia to suggest chronic cortical infarction elsewhere. Suspected scattered chronic micro hemorrhages clustered about the cerebellum, brainstem, and deep gray nuclei, suggesting chronic poorly controlled hypertension, incompletely assessed on this limited exam. No visible mass  lesion or mass effect. No midline shift or hydrocephalus. No extra-axial fluid collection. Vascular: Not assessed on this exam. Skull and upper cervical spine: Not assessed on this exam. Sinuses/Orbits: Not assessed on this exam. Other: None. IMPRESSION: 1. Technically limited exam due to the patient's inability to tolerate the study. DWI sequences only were performed. 2. 1 cm focus of mild diffusion abnormality at the posterior limb of the right internal capsule, consistent with a small subacute ischemic infarct. 3. No other definite acute intracranial abnormality seen on this limited exam. 4. Suspected scattered chronic micro hemorrhages clustered about the cerebellum, brainstem, and deep gray nuclei, suggesting chronic poorly controlled hypertension, incompletely assessed on this exam. Electronically Signed   By: Jeannine Boga M.D.   On: 04/08/2020 03:27   ECHOCARDIOGRAM COMPLETE BUBBLE STUDY  Result Date: 04/06/2020    ECHOCARDIOGRAM REPORT   Patient Name:   Roberta Calderon Date of  Exam: 04/06/2020 Medical Rec #:  [id number]          Height:       70.0 in Accession #:    [id number]         Weight:       137.0 lb Date of Birth:  1970/10/19           BSA:          1.777 m Patient Age:    24 years           BP:           175/109 mmHg Patient Gender: F                  HR:           69 bpm. Exam Location:  Inpatient Procedure: 2D Echo, Cardiac Doppler, Color Doppler and Saline Contrast Bubble            Study Indications:    Stroke  History:        Patient has no prior history of Echocardiogram examinations.                 Stroke; Risk Factors:Current Smoker and Hypertension.  Sonographer:    Roseanna Rainbow RDCS Referring Phys: [place]  1. Left ventricular ejection fraction, by estimation, is 65 to 70%. The left ventricle has hyperdynamic function. The left ventricle has no regional wall motion abnormalities. There is moderate left ventricular hypertrophy. Left ventricular  diastolic parameters are consistent with Grade I diastolic dysfunction (impaired relaxation). There was a left ventricular mid-cavity gradient peak 20 mmHg.  2. Right ventricular systolic function is normal. The right ventricular size is normal. Tricuspid regurgitation signal is inadequate for assessing PA pressure.  3. The aortic valve is tricuspid. Aortic valve regurgitation is not visualized. No aortic stenosis is present.  4. The mitral valve is normal in structure. No evidence of mitral valve regurgitation. No evidence of mitral stenosis.  5. The inferior vena cava is normal in size with greater than 50% respiratory variability, suggesting right atrial pressure of 3 mmHg. FINDINGS  Left Ventricle: Left ventricular ejection fraction, by estimation, is 65 to 70%. The left ventricle has hyperdynamic function. The left ventricle has no regional wall motion abnormalities. The left ventricular internal cavity size was normal in size. There is moderate left ventricular hypertrophy. Left ventricular diastolic parameters are consistent with Grade I diastolic dysfunction (impaired relaxation). Right Ventricle: The right ventricular size is normal. No increase in right ventricular wall thickness. Right ventricular systolic function is normal. Tricuspid regurgitation signal is inadequate for assessing PA pressure. Left Atrium: Left atrial size was normal in size. Right Atrium: Right atrial size was normal in size. Pericardium: There is no evidence of pericardial effusion. Mitral Valve: The mitral valve is normal in structure. Mild mitral annular calcification. No evidence of mitral valve regurgitation. No evidence of mitral valve stenosis. Tricuspid Valve: The tricuspid valve is normal in structure. Tricuspid valve regurgitation is not demonstrated. Aortic Valve: The aortic valve is tricuspid. Aortic valve regurgitation is not visualized. No aortic stenosis is present. Pulmonic Valve: The pulmonic valve was normal in  structure. Pulmonic valve regurgitation is not visualized. Aorta: The aortic root is normal in size and structure. Venous: The inferior vena cava is normal in size with greater than 50% respiratory variability, suggesting right atrial pressure of 3 mmHg. IAS/Shunts: No atrial level shunt detected by color flow Doppler. Agitated saline contrast was given  intravenously to evaluate for intracardiac shunting.  LEFT VENTRICLE PLAX 2D LVIDd:         3.30 cm     Diastology LVIDs:         2.10 cm     LV e' lateral:   5.66 cm/s LV PW:         1.50 cm     LV E/e' lateral: 9.8 LV IVS:        1.50 cm     LV e' medial:    4.46 cm/s LVOT diam:     1.90 cm     LV E/e' medial:  12.4 LV SV:         63 LV SV Index:   35 LVOT Area:     2.84 cm  LV Volumes (MOD) LV vol d, MOD A2C: 70.7 ml LV vol d, MOD A4C: 57.7 ml LV vol s, MOD A2C: 14.8 ml LV vol s, MOD A4C: 12.1 ml LV SV MOD A2C:     55.9 ml LV SV MOD A4C:     57.7 ml LV SV MOD BP:      51.1 ml RIGHT VENTRICLE             IVC RV S prime:     13.30 cm/s  IVC diam: 1.80 cm TAPSE (M-mode): 2.5 cm LEFT ATRIUM             Index       RIGHT ATRIUM           Index LA diam:        2.90 cm 1.63 cm/m  RA Area:     10.10 cm LA Vol (A2C):   43.8 ml 24.64 ml/m RA Volume:   20.20 ml  11.37 ml/m LA Vol (A4C):   37.8 ml 21.27 ml/m LA Biplane Vol: 41.0 ml 23.07 ml/m  AORTIC VALVE LVOT Vmax:   112.00 cm/s LVOT Vmean:  74.500 cm/s LVOT VTI:    0.221 m  AORTA Ao Root diam: 2.70 cm Ao Asc diam:  3.20 cm MITRAL VALVE MV Area (PHT): 1.86 cm    SHUNTS MV Decel Time: 408 msec    Systemic VTI:  0.22 m MV E velocity: 55.20 cm/s  Systemic Diam: 1.90 cm MV A velocity: 79.20 cm/s MV E/A ratio:  0.70 Loralie Champagne MD Electronically signed by Loralie Champagne MD Signature Date/Time: 04/06/2020/7:27:36 PM    Final     Labs:  Basic Metabolic Panel: Recent Labs  Lab 04/08/20 1944 04/11/20 0810  NA  --  137  K  --  3.9  CL  --  103  CO2  --  22  GLUCOSE  --  157*  BUN  --  13  CREATININE 0.86 0.70   CALCIUM  --  9.5    CBC: Recent Labs  Lab 04/06/20 1105 04/08/20 1944 04/11/20 0810  WBC 4.6 5.7 6.5  NEUTROABS 2.1  --  3.2  HGB 13.6 14.0 14.0  HCT 44.8 45.3 44.4  MCV 83.7 81.3 82.5  PLT 299 316 325    CBG: No results for input(s): GLUCAP in the last 168 hours.  Family history mother and father with hypertension as hyperlipidemia.  Denies any colon cancer esophageal cancer rectal cancer  Brief HPI:   Roberta Calderon is a 49 y.o. right-handed female with history of hypertension as well as tobacco alcohol use who presented 04/07/2019 with left-sided weakness x1 week.  Patient lives alone works at [employer] independent  prior to admission.  She has a mother and daughter in the area.  Cranial CT scan showed scattered areas of decreased attenuation on the right consistent with subacute ischemia.  Patient did not receive TPA.  CTA of head and neck no intracranial large vessel occlusion.  MRI completed limited due to patient's inability tolerate study showed a 1 cm focus of mild diffusion abnormality of the posterior limb of the internal capsule consistent with small subacute infarction.  Suspected scattered chronic microhemorrhages clustered about the cerebellum and brainstem.  Echocardiogram with ejection fraction of 02% grade 1 diastolic dysfunction.  Admission chemistries urine drug screen positive cocaine and benzos glucose 108 hemoglobin A1c 6.3.  Maintain on aspirin and Plavix x3 weeks and aspirin alone however patient developed itching with aspirin and she refused.  Subcutaneous Lovenox for DVT prophylaxis.  Tolerating regular diet.  Patient was admitted for a comprehensive rehab program   Hospital Course: Roberta Calderon was admitted to rehab 04/08/2020 for inpatient therapies to consist of PT, ST and OT at least three hours five days a week. Past admission physiatrist, therapy team and rehab RN have worked together to provide customized collaborative inpatient rehab.   Pertaining to patient's right basal ganglia right parietal lobe infarction remained stable.  She would follow neurology services.  Aspirin Plavix x21 days and aspirin alone was recommended with patient had itching with aspirin and this was discontinued. Subcutaneous Lovenox for DVT prophylaxis.  Lipitor ongoing for hyperlipidemia.  Blood pressure controlled with Norvasc as well as lisinopril which was slowly titrated.  History of tobacco alcohol polysubstance abuse urine drug screen positive cocaine patient did receive counts regards to cessation of these products.  Prediabetes hemoglobin A1c 6.3 follow-up outpatient patient at this time was not agreeable to a carb modified diet.   Blood pressures were monitored on TID basis and controlled     Rehab course: During patient's stay in rehab weekly team conferences were held to monitor patient's progress, set goals and discuss barriers to discharge. At admission, patient required moderate assist 130 feet to person hand-held assist minimal guard sit to supine.  Set up upper body bathing minimal assist upper body dressing minimal assist lower body dressing minimal assist toilet transfers  Physical exam.  Blood pressure 164/100 pulse 79 temperature 98 respirations 17 oxygen saturations 100% room air Constitutional.  Alert and oriented HEENT Head.  Normocephalic and atraumatic Eyes.  Pupils round and reactive to light no discharge.nystagmus Neck.  Supple nontender no JVD without thyromegaly Cardiac regular rate rhythm without any extra sounds or murmur heard Abdomen.  Soft nontender positive bowel sounds without rebound Respiratory effort normal no respiratory distress without wheeze Extremities.  No clubbing or cyanosis Neurological.  Alert oriented follows commands fair awareness of deficits. Motor.  Left upper extremity 3+/5 proximal distal Left lower extremity 3+/5 proximal distal speech was dysarthric but intelligible.  Roberta Calderon  has had improvement  in activity tolerance, balance, postural control as well as ability to compensate for deficits. He/She has had improvement in functional use RUE/LUE  and RLE/LLE as well as improvement in awareness.  Patient initially needed some encouragement to participate.  Ambulating 300 feet supervision rolling walker in hallways.  Negotiated up and down stairs supervision.  Gather belongings for activities day living and homemaking.  Discussed at length no driving.  Full family teaching completed discharge to home       Disposition: Discharge to home    Diet: Regular  Special Instructions: No driving smoking alcohol or  illicit drug use    Medications at discharge 1.  Tylenol as needed 2.  Norvasc 10 mg p.o. daily 3.  Lipitor 80 mg p.o. daily 4.  Plavix 75 mg p.o. daily 5.  Lisinopril 5 mg p.o. daily 6.  NicoDerm patch taper as directed   30-35 minutes were spent completing discharge summary and discharge planning  Discharge Instructions     Ambulatory referral to Neurology   Complete by: As directed    An appointment is requested in approximately 4 weeks right thalamic infarction   Ambulatory referral to Physical Medicine Rehab   Complete by: As directed    Moderate complexity follow-up 1 to 2 weeks right thalamic infarction        Follow-up Information     Jamse Arn, MD Follow up.   Specialty: Physical Medicine and Rehabilitation Why: Office to call for appointment Contact information: [address] [place] [address] [place] Follow up on 05/12/2020.   Why: Appointment 9:30 Am Dr. Jearl Klinefelter information: [place] [id number] [phone]                Signed: Cathlyn Parsons 04/13/2020, 5:53 AM Patient was seen, face-face, and physical exam performed by me on day of discharge, greater than 30 minutes of total time spent.. Please see progress  note from day of discharge as well.  Delice Lesch, MD, ABPMR

## 2020-04-13 ENCOUNTER — Inpatient Hospital Stay (HOSPITAL_COMMUNITY): Payer: Self-pay | Admitting: Physical Therapy

## 2020-04-13 ENCOUNTER — Inpatient Hospital Stay (HOSPITAL_COMMUNITY): Payer: Self-pay

## 2020-04-13 ENCOUNTER — Inpatient Hospital Stay (HOSPITAL_COMMUNITY): Payer: Self-pay | Admitting: Occupational Therapy

## 2020-04-13 NOTE — Plan of Care (Signed)
Problem: Consults Goal: Nutrition Consult-if indicated Outcome: Completed/Met   Problem: RH BOWEL ELIMINATION Goal: RH STG MANAGE BOWEL WITH ASSISTANCE Description: STG Manage Bowel with mod I Assistance. Outcome: Completed/Met Goal: RH STG MANAGE BOWEL W/MEDICATION W/ASSISTANCE Description: STG Manage Bowel with Medication with mod I Assistance. Outcome: Completed/Met   Problem: RH BLADDER ELIMINATION Goal: RH STG MANAGE BLADDER WITH ASSISTANCE Description: STG Manage Bladder With no Assistance Outcome: Completed/Met   Problem: RH SKIN INTEGRITY Goal: RH STG SKIN FREE OF INFECTION/BREAKDOWN Description: With min assist to check skin Outcome: Completed/Met Goal: RH STG MAINTAIN SKIN INTEGRITY WITH ASSISTANCE Description: STG Maintain Skin Integrity With min Assistance. Outcome: Completed/Met   Problem: RH SAFETY Goal: RH STG ADHERE TO SAFETY PRECAUTIONS W/ASSISTANCE/DEVICE Description: STG Adhere to Safety Precautions With cues/ reminders Assistance/Device. Outcome: Completed/Met Goal: RH STG DECREASED RISK OF FALL WITH ASSISTANCE Description: STG Decreased Risk of Fall With cues/reminders Assistance. Outcome: Completed/Met   Problem: RH PAIN MANAGEMENT Goal: RH STG PAIN MANAGED AT OR BELOW PT'S PAIN GOAL Description: At or below level 3 Outcome: Completed/Met   Problem: RH KNOWLEDGE DEFICIT GENERAL Goal: RH STG INCREASE KNOWLEDGE OF SELF CARE AFTER HOSPITALIZATION Description: Patient will be able to direct care of self for discharge using handouts and educational books with cues.reminders Outcome: Completed/Met   Problem: Consults Goal: RH STROKE PATIENT EDUCATION Description: See Patient Education module for education specifics  Outcome: Completed/Met   Problem: RH KNOWLEDGE DEFICIT Goal: RH STG INCREASE KNOWLEDGE OF HYPERTENSION Description: Patient will be able to manage HTN using medications, and diet with educational handouts/resources  independently Outcome: Completed/Met Goal: RH STG INCREASE KNOWLEGDE OF HYPERLIPIDEMIA Description: Patient will be able to manage HLD using medications, and diet with educational handouts/resources independently Outcome: Completed/Met Goal: RH STG INCREASE KNOWLEDGE OF STROKE PROPHYLAXIS Description: Patient will be able to manage secondary stroke  risks using medications, and diet with educational handouts/resources independently Outcome: Completed/Met

## 2020-04-13 NOTE — Patient Care Conference (Signed)
Inpatient RehabilitationTeam Conference and Plan of Care Update Date: 04/13/2020   Time: 11:16 AM    Patient Name: Roberta Calderon      Medical Record Number: [id number]  Date of Birth: Mar 13, 1971 Sex: Female         Room/Bed: 4M12C/4M12C-02 Payor Info: Payor: /    Admit Date/Time:  04/08/2020  3:09 PM  Primary Diagnosis:  Right thalamic infarction [facility])  Hospital Problems: Principal Problem:   Right thalamic infarction [facility]) Active Problems:   Dyslipidemia    Expected Discharge Date: Expected Discharge Date: 04/12/20  Team Members Present: Physician leading conference: Dr. Delice Lesch Care Coodinator Present: Dorien Chihuahua, RN, BSN, CRRN;Other (comment) Jacqlyn Larsen Dupree, OT) Nurse Present: Other (comment) Dina Rich, RN) PT Present: Other (comment) (Christain Manhard, PT) OT Present: Meriel Pica, OT PPS Coordinator present : Gunnar Fusi, Novella Olive, PT     Current Status/Progress Goal Weekly Team Focus  Bowel/Bladder   Continent of bowel and bladder, last BM 04/12/20  Remain continent of bowel and bladder   Assess toileting q shift and prn    Swallow/Nutrition/ Hydration             ADL's   independent - pt is ready for discharge  Mod I  pt education   Mobility   indep bed mobility, supervision transfers, supervision ambulation >560ft with no AD  indep/modI goals  DC planning, balance, general strengthening   Communication             Safety/Cognition/ Behavioral Observations            Pain   Patient voices no compliants of pain   Remain free of pain   Assess pain q shift and treat as needed    Skin   No skin issues   Maintain skin intergrity   Assess skin q shift, inform paitient to report any skin issues that may arise      Discharge Planning:  Going to mom's home who currently works but her brother can assist. Pt will probably be alone some of this time and needs to be mod/i to go home safely   Team Discussion: On target for  discharge Patient on target to meet rehab goals: yes  *See Care Plan and progress notes for long and short-term goals.   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  Barriers to Discharge: Other (comments)  Barriers to Discharge Comments: Making quick progress Possible Resolutions to Barriers/Weekly Focus: Patient education, optimize BP meds, diet education   Continued Need for Acute Rehabilitation Level of Care: The patient requires daily medical management by a physician with specialized training in physical medicine and rehabilitation for the following reasons: Direction of a multidisciplinary physical rehabilitation program to maximize functional independence : Yes Medical management of patient stability for increased activity during participation in an intensive rehabilitation regime.: Yes Analysis of laboratory values and/or radiology reports with any subsequent need for medication adjustment and/or medical intervention. : Yes   I attest that I was present, lead the team conference, and concur with the assessment and plan of the team.   Dorien Chihuahua B 04/13/2020, 4:19 PM

## 2020-04-13 NOTE — Progress Notes (Signed)
Occupational Therapy Discharge Summary  Patient Details  Name: Roberta Calderon MRN: [id number] Date of Birth: Feb 22, 1971     Patient has met 9 of 9 long term goals due to improved activity tolerance, improved balance, functional use of  LEFT upper and LEFT lower extremity and improved coordination.  Patient to discharge at overall Independent level.  Patient's care partner is independent to provide the necessary physical assistance at discharge.    Reasons goals not met: n/a  Recommendation:  No further OT needs.  Equipment: No equipment provided  Reasons for discharge: treatment goals met  Patient/family agrees with progress made and goals achieved: Yes  OT Discharge Precautions/Restrictions  Precautions Precautions: None Pain Pain Assessment Pain Scale: 0-10 Pain Score: 0-No pain ADL ADL Eating: Independent Grooming: Independent Where Assessed-Grooming: Standing at sink Upper Body Bathing: Independent Where Assessed-Upper Body Bathing: Standing at sink Lower Body Bathing: Independent Where Assessed-Lower Body Bathing: Standing at sink Upper Body Dressing: Independent Where Assessed-Upper Body Dressing: Other (Comment) (bathroom) Lower Body Dressing: Independent Where Assessed-Lower Body Dressing: Other (Comment) (bathroom) Toileting: Independent Toilet Transfer: Modified independent Toilet Transfer Method: Counselling psychologist: Other (comment) (RW) Walk-In Shower Transfer: Distant supervision Social research officer, government Method: Ambulating (without AD) Youth worker:  (none) Vision Baseline Vision/History: No visual deficits Wears Glasses: At all times Patient Visual Report: No change from baseline Vision Assessment?: No apparent visual deficits Perception  Perception: Within Functional Limits Praxis Praxis: Intact Cognition Overall Cognitive Status: Within Functional Limits for tasks assessed Sensation Sensation Light Touch:  Appears Intact Hot/Cold: Appears Intact Proprioception: Appears Intact Stereognosis: Appears Intact Coordination Gross Motor Movements are Fluid and Coordinated: Yes Fine Motor Movements are Fluid and Coordinated: Yes Motor  Motor Motor: Hemiplegia Motor - Skilled Clinical Observations: mild L hemiplegia in LLE Mobility  Transfers Stand to Sit: Independent  Trunk/Postural Assessment  Cervical Assessment Cervical Assessment: Within Functional Limits Thoracic Assessment Thoracic Assessment: Within Functional Limits Lumbar Assessment Lumbar Assessment: Within Functional Limits Postural Control Postural Control: Within Functional Limits  Balance Static Sitting Balance Static Sitting - Level of Assistance: 7: Independent Dynamic Sitting Balance Dynamic Sitting - Level of Assistance: 7: Independent Static Standing Balance Static Standing - Level of Assistance: 7: Independent Dynamic Standing Balance Dynamic Standing - Level of Assistance: 7: Independent Extremity/Trunk Assessment RUE Assessment RUE Assessment: Within Functional Limits LUE Assessment LUE Assessment: Within Functional Limits   Khyre Germond 04/13/2020, 8:33 AM

## 2020-04-13 NOTE — Progress Notes (Signed)
[place] PHYSICAL MEDICINE & REHABILITATION PROGRESS NOTE   Subjective/Complaints: Patient seen laying in bed this morning.  She states she slept well overnight.  She is eager for discharge.  She is appreciative.  ROS: Denies CP, SOB, N/V/D  Objective:   No results found. Recent Labs    04/11/20 0810  WBC 6.5  HGB 14.0  HCT 44.4  PLT 325   Recent Labs    04/11/20 0810  NA 137  K 3.9  CL 103  CO2 22  GLUCOSE 157*  BUN 13  CREATININE 0.70  CALCIUM 9.5    Intake/Output Summary (Last 24 hours) at 04/13/2020 0858 Last data filed at 04/12/2020 1827 Gross per 24 hour  Intake 622 ml  Output --  Net 622 ml     Physical Exam: Vital Signs Blood pressure (!) 146/93, pulse 77, temperature 97.8 F (36.6 C), resp. rate 18, height 5\' 9"  (1.753 m), weight 57.2 kg, SpO2 100 %. Constitutional: No distress . Vital signs reviewed. HENT: Normocephalic.  Atraumatic. Eyes: EOMI. No discharge. Cardiovascular: No JVD. Respiratory: Normal effort.  No stridor. GI: Non-distended. Skin: Warm and dry.  Intact. Psych: Normal mood.  Normal behavior. Musc: No edema in extremities.  No tenderness in extremities. Neuro: Alert Motor: RUE/RLE: 5/5 proximal distal LUE/LLE: 4+-5/5 proximal distal Dysarthria, improving  Assessment/Plan: 1. Functional deficits secondary to right thalamic infarction which require 3+ hours per day of interdisciplinary therapy in a comprehensive inpatient rehab setting.  Physiatrist is providing close team supervision and 24 hour management of active medical problems listed below.  Physiatrist and rehab team continue to assess barriers to discharge/monitor patient progress toward functional and medical goals  Care Tool:  Bathing    Body parts bathed by patient: Left arm, Right arm, Chest, Abdomen, Front perineal area, Buttocks, Right upper leg, Left upper leg, Right lower leg, Left lower leg, Face         Bathing assist Assist Level: Independent      Upper Body Dressing/Undressing Upper body dressing   What is the patient wearing?: Bra, Pull over shirt    Upper body assist Assist Level: Independent    Lower Body Dressing/Undressing Lower body dressing    Lower body dressing activity did not occur: Refused What is the patient wearing?: Underwear/pull up, Pants     Lower body assist Assist for lower body dressing: Independent     Toileting Toileting    Toileting assist Assist for toileting: Independent     Transfers Chair/bed transfer  Transfers assist     Chair/bed transfer assist level: Independent     Locomotion Ambulation   Ambulation assist      Assist level: Independent Assistive device: No Device Max distance: 2062ft   Walk 10 feet activity   Assist     Assist level: Independent Assistive device: No Device   Walk 50 feet activity   Assist    Assist level: Independent Assistive device: No Device    Walk 150 feet activity   Assist Walk 150 feet activity did not occur: Safety/medical concerns  Assist level: Independent Assistive device: No Device    Walk 10 feet on uneven surface  activity   Assist Walk 10 feet on uneven surfaces activity did not occur: Safety/medical concerns   Assist level: Independent Assistive device:  (no AD)   Wheelchair     Assist Will patient use wheelchair at discharge?: No Type of Wheelchair: Manual    Wheelchair assist level: Minimal Assistance - Patient > 75% Max  wheelchair distance: 150    Wheelchair 50 feet with 2 turns activity    Assist        Assist Level: Minimal Assistance - Patient > 75%   Wheelchair 150 feet activity     Assist      Assist Level: Minimal Assistance - Patient > 75%   Blood pressure (!) 146/93, pulse 77, temperature 97.8 F (36.6 C), resp. rate 18, height 5\' 9"  (1.753 m), weight 57.2 kg, SpO2 100 %.  Medical Problem List and Plan: 1.  Left side hemiparesis secondary to right thalamic  right basal ganglia right parietal lobe infarction  DC today  Will see patient for transitional care management in 1-2 weeks post-discharge 2.  Antithrombotics: -DVT/anticoagulation: Lovenox-discussed compliance, patient verbally agreeable-however refused again last night  CBC within acceptable range on on 8/16             -antiplatelet therapy: Aspirin 81 mg daily and Plavix 75 mg daily x21 days then aspirin alone 3. Pain Management: Tylenol as needed. 4. Mood: Provide emotional support             -antipsychotic agents: N/A 5. Neuropsych: This patient is capable of making decisions on her own behalf. 6. Skin/Wound Care: Routine skin checks.  7. Fluids/Electrolytes/Nutrition: Routine in and outs.   8.  Hypertension.  Norvasc 10 mg daily.   Increase Lisinopril to 5mg .  Mildly elevated diastolic pressures on 4/54, continue to monitor ambulatory setting             Monitor the increased mobility. 9.  Hyperlipidemia.    Continue Lipitor 10.  Tobacco/alcohol and polysubstance abuse.  Urine drug screen positive cocaine.  Counsel 11.  Prediabetes.  Hemoglobin A1c 6.3.  Follow-up outpatient.             Blood glucose elevated on 8/16, continue to monitor ambulatory setting  Will consider changing diet to carb modified, however will maintain current diet for time being avoid additional stressor and desire for patient to be discharged  Monitor increase mobility 12. Chest pain: Resolved  > 30 minutes spent in total in discharge planning between myself and PA regarding aforementioned, as well discussion regarding DME equipment, follow-up appointments, follow-up therapies, discharge medications, discharge recommendations  LOS: 5 days A FACE TO FACE EVALUATION WAS PERFORMED  Roberta Calderon 04/13/2020, 8:58 AM

## 2020-04-13 NOTE — Progress Notes (Signed)
Patient discharged off of unit with all belongings. Discharge papers/instructions explained by physician assistant to family. Patient and family have no further questions at time of discharge. No complications noted at this time.  Roberta Calderon  

## 2020-04-25 ENCOUNTER — Encounter: Payer: Self-pay | Attending: Registered Nurse | Admitting: Registered Nurse

## 2020-05-11 ENCOUNTER — Encounter: Payer: Self-pay | Admitting: Adult Health

## 2020-05-11 ENCOUNTER — Inpatient Hospital Stay: Payer: Self-pay | Admitting: Adult Health

## 2020-05-11 NOTE — Progress Notes (Deleted)
[facility] [address] [place]. [place] [address] [phone]       HOSPITAL FOLLOW UP NOTE  [name] Date of Birth:  1971/05/21 Medical Record Number:  [id number]   Reason for Referral:  hospital stroke follow up    SUBJECTIVE:   CHIEF COMPLAINT:  No chief complaint on file.   HPI:   [name] is a 49 y.o. female w/pmh of HTN, tobacco abuse (1 pack/day)alcohol use disorder(reportedly 6 beers per day), homelessness who presented on 04/05/2020 with 1 week of left sided weakness.  Stroke work-up revealed acute to subacute strokes in the right thalamus, right basal ganglia and deep white matter of the right parietal lobe infarcts likely secondary to small vessel disease with multiple risk factors including HTN, HLD, prediabetes and tobacco use.  MRI also showed chronic microhemorrhages throughout likely due to uncontrolled HTN.  CTA head/neck unremarkable.  2D echo normal.  LDL 173.  A1c 6.3.  Intolerant to aspirin previously and recommend initiating Plavix monotherapy for secondary stroke prevention.  Initiate atorvastatin 80 mg daily for elevated LDL.  Smoking cessation counseling provided.  Evaluated by therapies and recommended discharge to CIR for ongoing therapy needs.       ROS:   14 system review of systems performed and negative with exception of ***  PMH:  Past Medical History:  Diagnosis Date  . Hypertension     PSH: No past surgical history on file.  Social History:  Social History   Socioeconomic History  . Marital status: Single    Spouse name: Not on file  . Number of children: Not on file  . Years of education: Not on file  . Highest education level: Not on file  Occupational History  . Not on file  Tobacco Use  . Smoking status: Current Every Day Smoker    Packs/day: 0.25    Types: Cigarettes  . Smokeless tobacco: Never Used  Substance and Sexual Activity  . Alcohol use: Yes  . Drug use: No  .  Sexual activity: Not on file  Other Topics Concern  . Not on file  Social History Narrative  . Not on file   Social Determinants of Health   Financial Resource Strain:   . Difficulty of Paying Living Expenses: Not on file  Food Insecurity:   . Worried About Charity fundraiser in the Last Year: Not on file  . Ran Out of Food in the Last Year: Not on file  Transportation Needs:   . Lack of Transportation (Medical): Not on file  . Lack of Transportation (Non-Medical): Not on file  Physical Activity:   . Days of Exercise per Week: Not on file  . Minutes of Exercise per Session: Not on file  Stress:   . Feeling of Stress : Not on file  Social Connections:   . Frequency of Communication with Friends and Family: Not on file  . Frequency of Social Gatherings with Friends and Family: Not on file  . Attends Religious Services: Not on file  . Active Member of Clubs or Organizations: Not on file  . Attends Archivist Meetings: Not on file  . Marital Status: Not on file  Intimate Partner Violence:   . Fear of Current or Ex-Partner: Not on file  . Emotionally Abused: Not on file  . Physically Abused: Not on file  . Sexually Abused: Not on file    Family History: No family history on file.  Medications:  Current Outpatient Medications on File Prior to Visit  Medication Sig Dispense Refill  . acetaminophen (TYLENOL) 325 MG tablet Take 2 tablets (650 mg total) by mouth every 4 (four) hours as needed for mild pain (or temp > 37.5 C (99.5 F)).    Marland Kitchen amLODipine (NORVASC) 10 MG tablet Take 1 tablet (10 mg total) by mouth daily. 30 tablet 2  . atorvastatin (LIPITOR) 80 MG tablet Take 1 tablet (80 mg total) by mouth daily. 30 tablet 2  . clopidogrel (PLAVIX) 75 MG tablet Take 1 tablet (75 mg total) by mouth daily. 30 tablet 2  . lisinopril (ZESTRIL) 5 MG tablet Take 1 tablet (5 mg total) by mouth daily. 30 tablet 0  . nicotine (NICODERM CQ - DOSED IN MG/24 HR) 7 mg/24hr patch One  Patch daily x3 weeks and stop 28 patch 0   No current facility-administered medications on file prior to visit.    Allergies:   Allergies  Allergen Reactions  . Nsaids Nausea And Vomiting and Rash  . Aspirin Hives      OBJECTIVE:  Physical Exam  There were no vitals filed for this visit. There is no height or weight on file to calculate BMI. No exam data present  No flowsheet data found.   General: well developed, well nourished, seated, in no evident distress Head: head normocephalic and atraumatic.   Neck: supple with no carotid or supraclavicular bruits Cardiovascular: regular rate and rhythm, no murmurs Musculoskeletal: no deformity Skin:  no rash/petichiae Vascular:  Normal pulses all extremities   Neurologic Exam Mental Status: Awake and fully alert. Oriented to place and time. Recent and remote memory intact. Attention span, concentration and fund of knowledge appropriate. Mood and affect appropriate.  Cranial Nerves: Fundoscopic exam reveals sharp disc margins. Pupils equal, briskly reactive to light. Extraocular movements full without nystagmus. Visual fields full to confrontation. Hearing intact. Facial sensation intact. Face, tongue, palate moves normally and symmetrically.  Motor: Normal bulk and tone. Normal strength in all tested extremity muscles. Sensory.: intact to touch , pinprick , position and vibratory sensation.  Coordination: Rapid alternating movements normal in all extremities. Finger-to-nose and heel-to-shin performed accurately bilaterally. Gait and Station: Arises from chair without difficulty. Stance is normal. Gait demonstrates normal stride length and balance Reflexes: 1+ and symmetric. Toes downgoing.     NIHSS  *** Modified Rankin  ***      ASSESSMENT: Roberta Calderon is a 49 y.o. year old female presented with 1 week onset of left-sided weakness on 04/05/2020 with stroke work-up revealing acute to subacute strokes in the right  thalamus, right basal ganglia and deep white matter right parietal lobe likely secondary to small vessel disease in setting of multiple risk factors including HTN, HLD, prediabetes and tobacco use.      PLAN:  1. R PLIC stroke : Residual deficit: ***. Continue clopidogrel 75 mg daily  and atorvastatin 80 mg daily for secondary stroke prevention.  Intolerance to aspirin previously.  Close PCP follow up for aggressive stroke risk factor management  2. HTN: BP goal <130/90. Continue f/u with PCP 3. HLD: LDL goal <70. Recent LDL 173. F/u with PCP for management as well as prescribing of statin 4. DMII: A1c goal<7.0. Recent A1c 6.3. F/u with PCP 5. Tobacco use:    Follow up in *** or call earlier if needed   I spent *** minutes of face-to-face and non-face-to-face time with patient.  This included previsit chart review, lab review, study review, order entry,  electronic health record documentation, patient education regarding recent stroke, residual deficits, importance of managing stroke risk factors and answered all questions to patient satisfaction     Frann Rider, [facility]  [facility] [address] [place], [place] [address]  Phone [phone] Fax [phone] Note: This document was prepared with digital dictation and possible smart phrase technology. Any transcriptional errors that result from this process are unintentional.

## 2020-05-12 ENCOUNTER — Inpatient Hospital Stay: Payer: Self-pay | Admitting: Family Medicine

## 2020-07-20 ENCOUNTER — Encounter: Payer: Self-pay | Admitting: Registered Nurse

## 2020-07-20 ENCOUNTER — Other Ambulatory Visit: Payer: Self-pay

## 2020-07-20 VITALS — BP 192/103 | HR 78 | Temp 98.5°F | Ht 62.0 in | Wt 130.0 lb

## 2020-07-20 NOTE — Progress Notes (Signed)
Subjective:    Patient ID: Roberta Calderon, female    DOB: [birth year], 49 y.o.   MRN: [id number]  HPI: Roberta Calderon is a 49 y.o. female who called the office on 07/19/2020, reporting she was in pain and wanted to be seen, Dr Posey Pronto didn't have any openings and she was placed on this provider scheduled.  Per Roberta Calderon front desk support representative " when patient was checking in, support rep was asking screening questions she stated " I am vaccinated. Patient then asked the support rep "why are you looking at me that way"? She walked away from check in window to a seat, she was asked to return to complete the check in process. She was asked to produce insurance and she failed to do so. Stating she has Medicaid. She was asked for a Photo ID, that was so degraded that it could not be scanned into the system. She continue to [place] back and forth in front of check in window, she was asked for the three dollar co-pay and stated she did not have the funds. Was told that for today's visit she would be billed and would need to produce her card and co-pay upon next visit.  Per: Roberta Calderon:  Patient was checked in and brought back to clinical area.  Pre-screening for temperature was completed and weight was obtained via digital scale. Patient was brought into room #11 to be checked in for visit with Nurse Practitioner Roberta Calderon. Check in process was uneventful.  Patient complained of uncontrolled pain and dizziness.  As I was leaving the room the patient asked if I could leave the door open.  I explained that due to HIPAA we were required to close the door.  I agreed to leave it cracked.  As I left the room I was closing the door and failed to stop it before it latched shut.  At this point the patient immediately opened the door and proceeded to berate me for purposely closing the door despite her request.  I apologized that I failed to keep it open and allowing the door to shut.  She continued to  be irrate and I could not get a word in edge wise.  At this point, I decided it would be best to go to the provider and explain the situation.  I debriefed Roberta Calderon, ANP on what had just transpired.  Roberta Calderon is a 49 y.o. female who was discharged from the inpatient rehabilitation on 04/13/2020, she was admitted for Right Thalamic Infarction, and never came to office for [place] visit,   Upon the provider entering the room, I reviewed the hospital discharge summary from 04/13/2020/,I asked Roberta Calderon  about her pain,she became argumentative. Reviewed hospital UDS, she states " I am adult: and left the examination room.    Pain Inventory Average Pain 9 Pain Right Now 9 My pain is sharp  In the last 24 hours, has pain interfered with the following? General activity n/a Relation with others n/a Enjoyment of life 6 What TIME of day is your pain at its worst? night Sleep (in general) Poor  Pain is worse with: unsure Pain improves with: nothing Relief from Meds: 0  walk without assistance how many minutes can you walk? 2 ability to climb steps?  no do you drive?  yes  not employed: date last employed . disabled: date disabled .  weakness numbness  hospital f/u  hospital f/u    History reviewed. No  pertinent family history. Social History   Socioeconomic History  . Marital status: Single    Spouse name: Not on file  . Number of children: Not on file  . Years of education: Not on file  . Highest education level: Not on file  Occupational History  . Not on file  Tobacco Use  . Smoking status: Current Every Day Smoker    Packs/day: 0.25    Types: Cigarettes  . Smokeless tobacco: Never Used  Substance and Sexual Activity  . Alcohol use: Yes  . Drug use: No  . Sexual activity: Not on file  Other Topics Concern  . Not on file  Social History Narrative  . Not on file   Social Determinants of Health   Financial Resource Strain:   . Difficulty of  Paying Living Expenses: Not on file  Food Insecurity:   . Worried About Charity fundraiser in the Last Year: Not on file  . Ran Out of Food in the Last Year: Not on file  Transportation Needs:   . Lack of Transportation (Medical): Not on file  . Lack of Transportation (Non-Medical): Not on file  Physical Activity:   . Days of Exercise per Week: Not on file  . Minutes of Exercise per Session: Not on file  Stress:   . Feeling of Stress : Not on file  Social Connections:   . Frequency of Communication with Friends and Family: Not on file  . Frequency of Social Gatherings with Friends and Family: Not on file  . Attends Religious Services: Not on file  . Active Member of Clubs or Organizations: Not on file  . Attends Archivist Meetings: Not on file  . Marital Status: Not on file   History reviewed. No pertinent surgical history. Past Medical History:  Diagnosis Date  . Hypertension    BP (!) 192/103   Pulse 78   Temp 98.5 F (36.9 C)   Ht 5\' 2"  (1.575 m)   Wt 130 lb (59 kg)   SpO2 98%   BMI 23.78 kg/m   Opioid Risk Score:   Fall Risk Score:  `1  Depression screen PHQ 2/9  No flowsheet data found.  Review of Systems  Constitutional: Negative.   Eyes: Negative.   Respiratory: Positive for apnea.   Cardiovascular: Negative.   Gastrointestinal: Negative.   Endocrine: Negative.   Genitourinary: Negative.   Musculoskeletal: Positive for arthralgias and myalgias.  Skin: Negative.   Allergic/Immunologic: Negative.   Neurological: Positive for dizziness, weakness, light-headedness and numbness.  Hematological: Negative.   Psychiatric/Behavioral: Negative.   All other systems reviewed and are negative.      Objective:   Physical Exam Vitals and nursing note reviewed.  Musculoskeletal:     Comments: No Physical Assessment performed: Roberta Calderon left the examination room.            Assessment & Plan:  1. Right Thalamic Infarction: Roberta Calderon  Left the examination Room 2. Poly Substance Abuse: Roberta Calderon Left the examination Room  Roberta Calderon left the office without scheduling a F/U appointment with Dr Posey Pronto.   Dr Posey Pronto was notified about the above events.

## 2021-02-06 ENCOUNTER — Other Ambulatory Visit: Payer: Self-pay

## 2021-02-06 ENCOUNTER — Emergency Department (HOSPITAL_COMMUNITY): Payer: Self-pay

## 2021-02-06 ENCOUNTER — Inpatient Hospital Stay (HOSPITAL_COMMUNITY)
Admission: EM | Admit: 2021-02-06 | Discharge: 2021-02-09 | DRG: 065 | Disposition: A | Discharge status: Home / Self Care | Payer: Self-pay | Attending: Neurology | Admitting: Neurology

## 2021-02-06 DIAGNOSIS — F17210 Nicotine dependence, cigarettes, uncomplicated: Secondary | ICD-10-CM | POA: Diagnosis present

## 2021-02-06 DIAGNOSIS — I6523 Occlusion and stenosis of bilateral carotid arteries: Secondary | ICD-10-CM | POA: Diagnosis present

## 2021-02-06 DIAGNOSIS — F1410 Cocaine abuse, uncomplicated: Secondary | ICD-10-CM | POA: Diagnosis present

## 2021-02-06 DIAGNOSIS — Z9114 Patient's other noncompliance with medication regimen: Secondary | ICD-10-CM

## 2021-02-06 DIAGNOSIS — R29705 NIHSS score 5: Secondary | ICD-10-CM | POA: Diagnosis present

## 2021-02-06 DIAGNOSIS — I613 Nontraumatic intracerebral hemorrhage in brain stem: Principal | ICD-10-CM | POA: Diagnosis present

## 2021-02-06 DIAGNOSIS — Z886 Allergy status to analgesic agent status: Secondary | ICD-10-CM

## 2021-02-06 DIAGNOSIS — Z20822 Contact with and (suspected) exposure to covid-19: Secondary | ICD-10-CM | POA: Diagnosis present

## 2021-02-06 DIAGNOSIS — E785 Hyperlipidemia, unspecified: Secondary | ICD-10-CM | POA: Diagnosis present

## 2021-02-06 DIAGNOSIS — R7303 Prediabetes: Secondary | ICD-10-CM | POA: Diagnosis present

## 2021-02-06 DIAGNOSIS — Z79899 Other long term (current) drug therapy: Secondary | ICD-10-CM

## 2021-02-06 DIAGNOSIS — I639 Cerebral infarction, unspecified: Secondary | ICD-10-CM

## 2021-02-06 DIAGNOSIS — I10 Essential (primary) hypertension: Secondary | ICD-10-CM | POA: Diagnosis present

## 2021-02-06 DIAGNOSIS — I161 Hypertensive emergency: Secondary | ICD-10-CM | POA: Diagnosis present

## 2021-02-06 DIAGNOSIS — I69354 Hemiplegia and hemiparesis following cerebral infarction affecting left non-dominant side: Secondary | ICD-10-CM

## 2021-02-06 DIAGNOSIS — I619 Nontraumatic intracerebral hemorrhage, unspecified: Secondary | ICD-10-CM | POA: Diagnosis present

## 2021-02-06 DIAGNOSIS — I629 Nontraumatic intracranial hemorrhage, unspecified: Secondary | ICD-10-CM

## 2021-02-06 LAB — I-STAT BETA HCG BLOOD, ED (MC, WL, AP ONLY): I-stat hCG, quantitative: <5 m[IU]/mL (ref <5)

## 2021-02-06 LAB — RESP PANEL BY RT-PCR (FLU A&B, COVID) ARPGX2
Influenza A by PCR: NEGATIVE
Influenza B by PCR: NEGATIVE
SARS Coronavirus 2 by RT PCR: NEGATIVE

## 2021-02-06 LAB — DIFFERENTIAL
Abs Immature Granulocytes: 0.01 10*3/uL (ref 0.00–0.07)
Basophils Absolute: 0 10*3/uL (ref 0.0–0.1)
Basophils Relative: 1 %
Eosinophils Absolute: 0.1 10*3/uL (ref 0.0–0.5)
Eosinophils Relative: 2 %
Immature Granulocytes: 0 %
Lymphocytes Relative: 38 %
Lymphs Abs: 2.2 10*3/uL (ref 0.7–4.0)
Monocytes Absolute: 0.5 10*3/uL (ref 0.1–1.0)
Monocytes Relative: 9 %
Neutro Abs: 3 10*3/uL (ref 1.7–7.7)
Neutrophils Relative %: 50 %

## 2021-02-06 LAB — I-STAT CHEM 8, ED
BUN: 12 mg/dL (ref 6–20)
Calcium, Ion: 1.2 mmol/L (ref 1.15–1.40)
Chloride: 106 mmol/L (ref 98–111)
Creatinine, Ser: 0.9 mg/dL (ref 0.44–1.00)
Glucose, Bld: 83 mg/dL (ref 70–99)
HCT: 50 % — ABNORMAL HIGH (ref 36.0–46.0)
Hemoglobin: 17 g/dL — ABNORMAL HIGH (ref 12.0–15.0)
Potassium: 3.9 mmol/L (ref 3.5–5.1)
Sodium: 142 mmol/L (ref 135–145)
TCO2: 26 mmol/L (ref 22–32)

## 2021-02-06 LAB — CBC
HCT: 49.3 % — ABNORMAL HIGH (ref 36.0–46.0)
Hemoglobin: 15.3 g/dL — ABNORMAL HIGH (ref 12.0–15.0)
MCH: 25.5 pg — ABNORMAL LOW (ref 26.0–34.0)
MCHC: 31 g/dL (ref 30.0–36.0)
MCV: 82.2 fL (ref 80.0–100.0)
Platelets: 343 10*3/uL (ref 150–400)
RBC: 6 MIL/uL — ABNORMAL HIGH (ref 3.87–5.11)
RDW: 15.3 % (ref 11.5–15.5)
WBC: 5.8 10*3/uL (ref 4.0–10.5)
nRBC: 0 % (ref 0.0–0.2)

## 2021-02-06 LAB — COMPREHENSIVE METABOLIC PANEL
ALT: 17 U/L (ref 0–44)
AST: 32 U/L (ref 15–41)
Albumin: 3.9 g/dL (ref 3.5–5.0)
Alkaline Phosphatase: 98 U/L (ref 38–126)
Anion gap: 10 (ref 5–15)
BUN: 11 mg/dL (ref 6–20)
CO2: 24 mmol/L (ref 22–32)
Calcium: 9.6 mg/dL (ref 8.9–10.3)
Chloride: 104 mmol/L (ref 98–111)
Creatinine, Ser: 0.94 mg/dL (ref 0.44–1.00)
GFR, Estimated: >60 mL/min (ref >60)
Glucose, Bld: 78 mg/dL (ref 70–99)
Potassium: 4.6 mmol/L (ref 3.5–5.1)
Sodium: 138 mmol/L (ref 135–145)
Total Bilirubin: 0.6 mg/dL (ref 0.3–1.2)
Total Protein: 7.4 g/dL (ref 6.5–8.1)

## 2021-02-06 LAB — TROPONIN I (HIGH SENSITIVITY)
Troponin I (High Sensitivity): 11 ng/L (ref <18)
Troponin I (High Sensitivity): 12 ng/L (ref <18)

## 2021-02-06 LAB — APTT: aPTT: 30 seconds (ref 24–36)

## 2021-02-06 LAB — PROTIME-INR
INR: 0.9 (ref 0.8–1.2)
Prothrombin Time: 12.5 seconds (ref 11.4–15.2)

## 2021-02-06 LAB — MAGNESIUM: Magnesium: 2.3 mg/dL (ref 1.7–2.4)

## 2021-02-06 LAB — ETHANOL: Alcohol, Ethyl (B): <10 mg/dL (ref <10)

## 2021-02-06 IMAGING — CT CT HEAD W/O CM
4 series · 16 of 47 positions shown, 18 images · non-contrast
Comparison: [DATE]

CLINICAL DATA: Dizziness, blurred vision.  History of hypertension.

EXAM:
CT HEAD WITHOUT CONTRAST
TECHNIQUE: Contiguous axial images were obtained from the base of the skull
through the vertex without intravenous contrast.

[Series 3: head wo · axial · 0.40mm/px · z∈[-97,+23]mm · 7 of 32 slices shown, 9 images]
[im 4/32  brain]
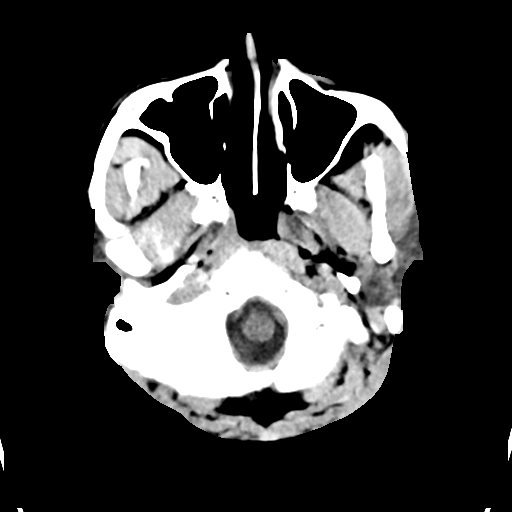
[im 4/32  bone]
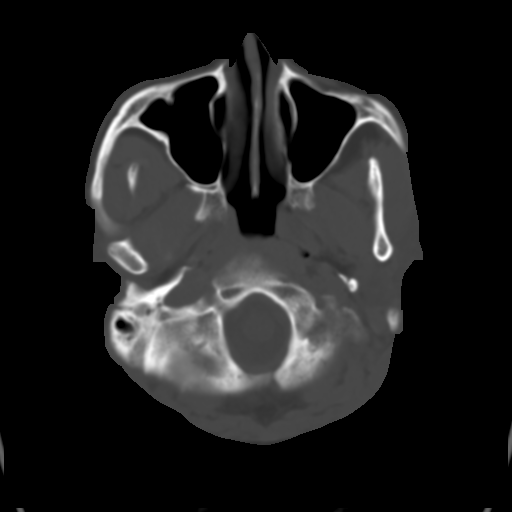
[im 8/32  brain]
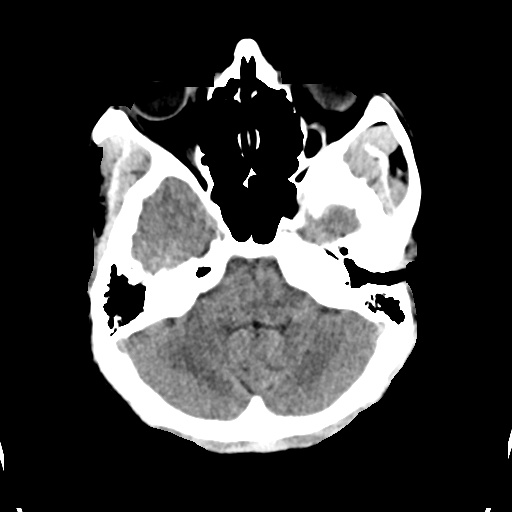
[im 12/32  brain]
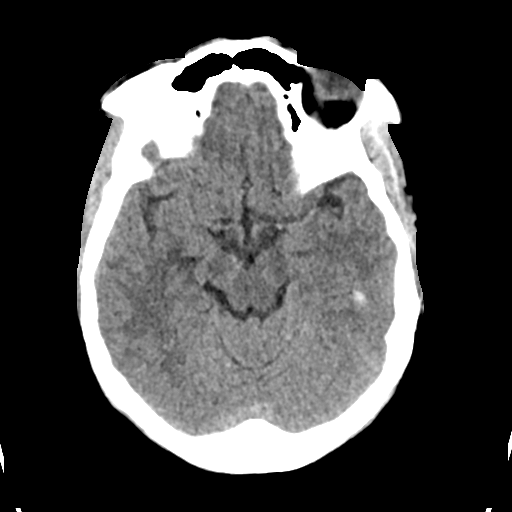
[im 16/32  brain]
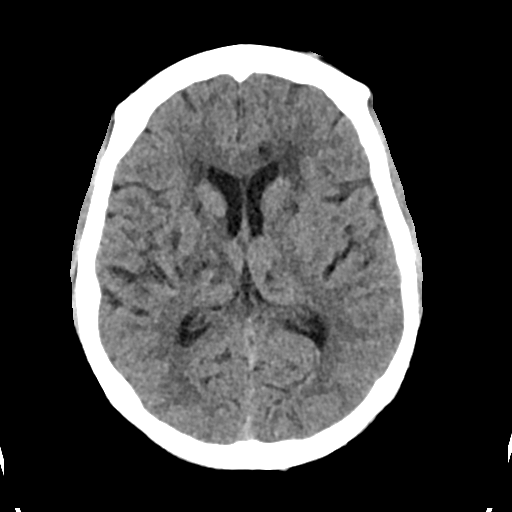
[im 20/32  brain]
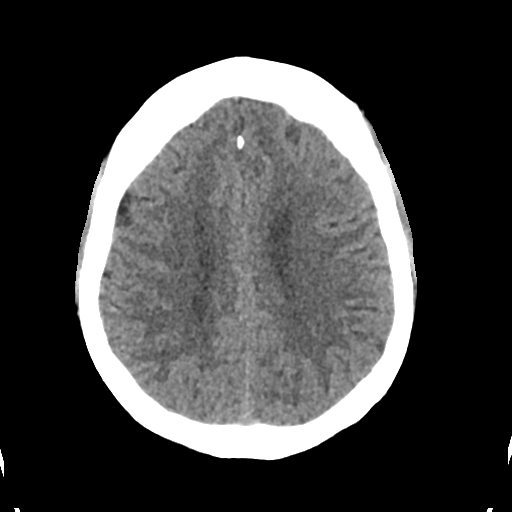
[im 20/32  bone]
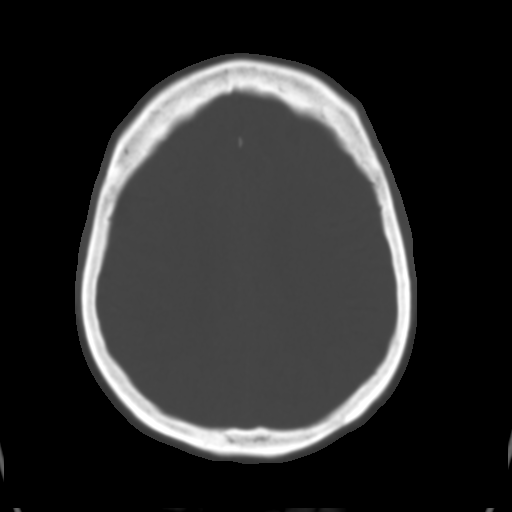
[im 24/32  brain]
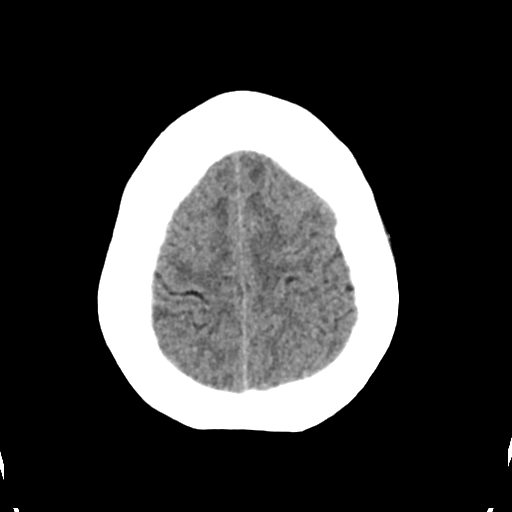
[im 28/32  brain]
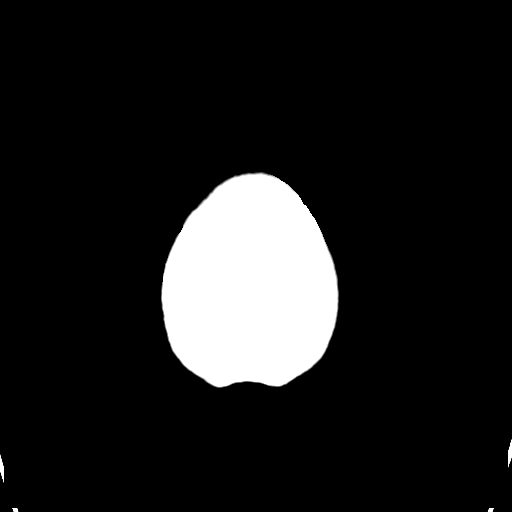

[Series 4: head bone · axial · 0.40mm/px · z∈[-98,-66]mm · 3 of 79 slices shown]
[im 8/79  bone]
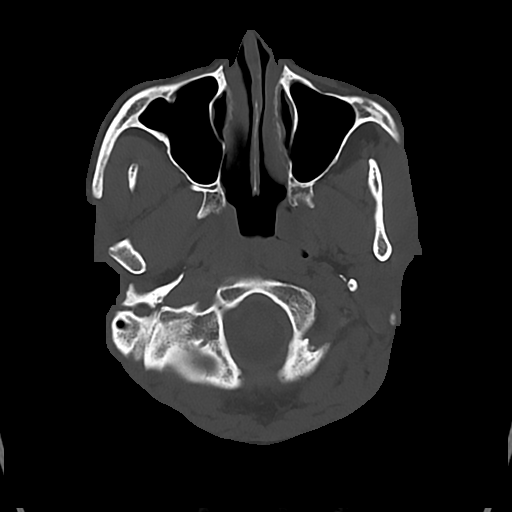
[im 16/79  bone]
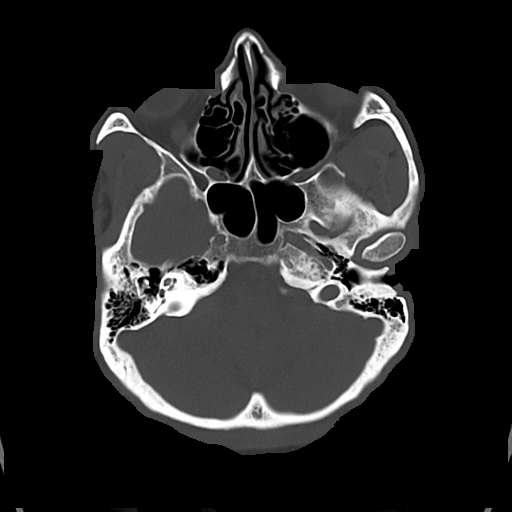
[im 24/79  bone]
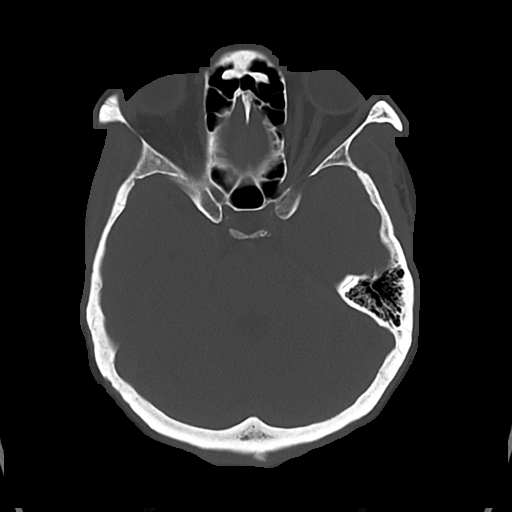

[Series 5: cor soft · coronal · 0.29mm/px · 3 of 68 slices shown]
[im 23/68  brain]
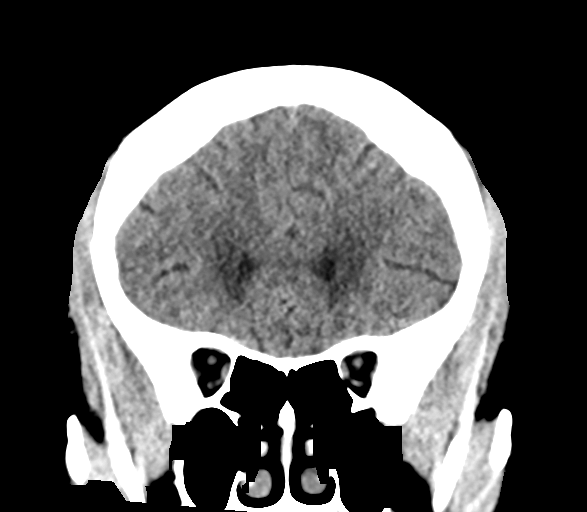
[im 30/68  brain]
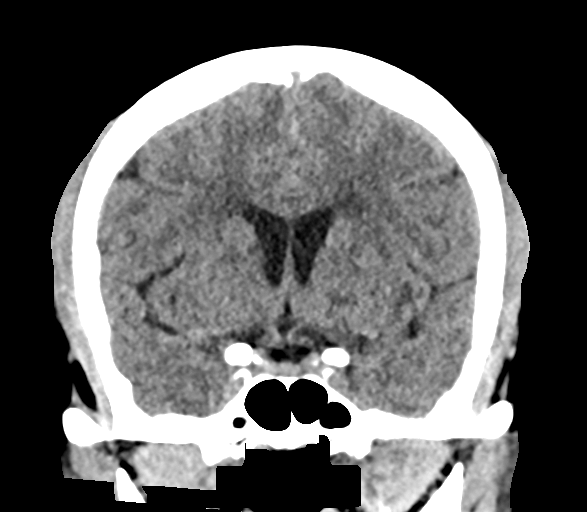
[im 38/68  brain]
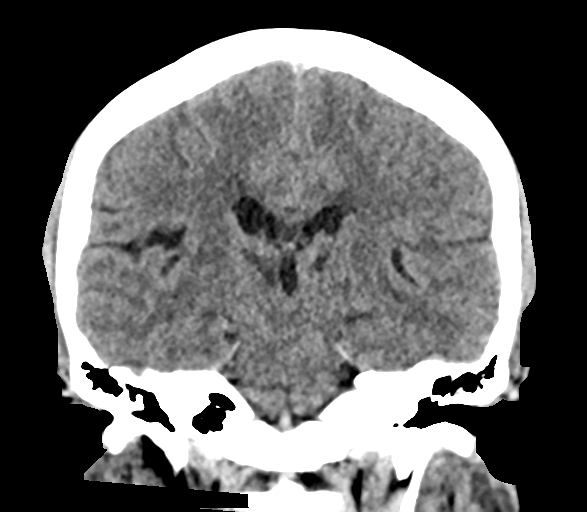

[Series 6: sag soft · sagittal · 0.29mm/px · 3 of 58 slices shown]
[im 20/58  brain]
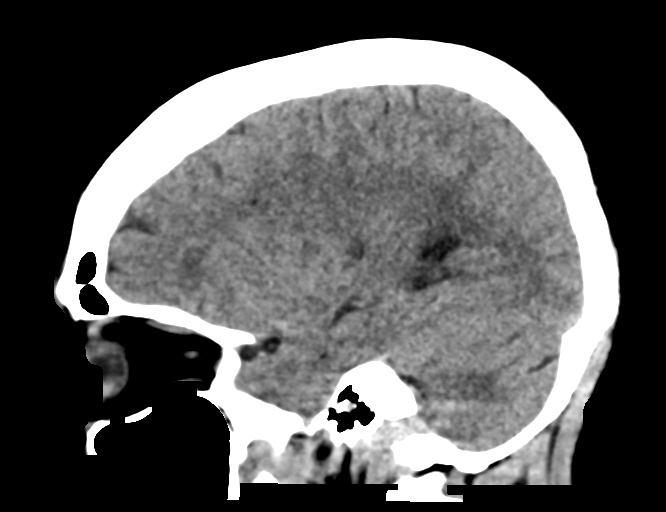
[im 29/58  brain]
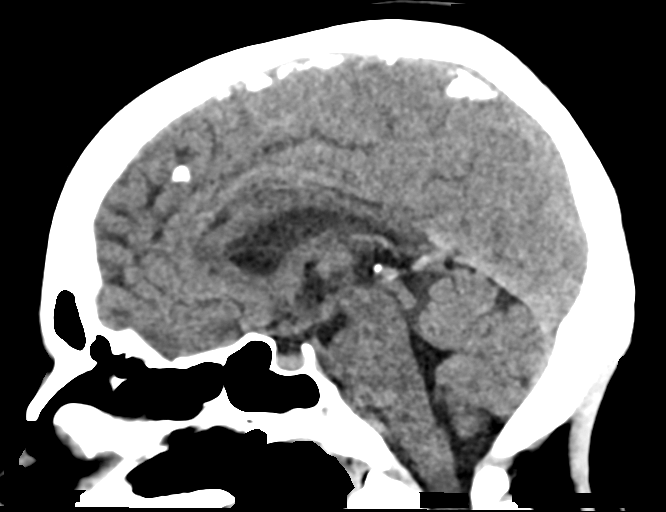
[im 39/58  brain]
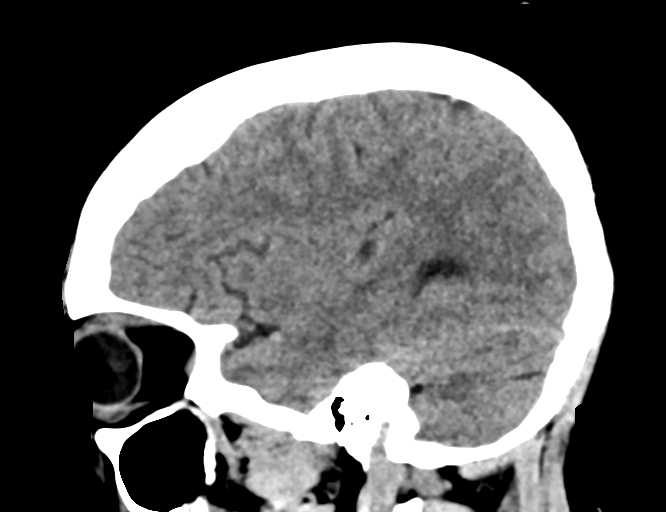

[16 of 47 positions shown; findings below may reference images not displayed]

FINDINGS: Brain: Focal area of hyperdensity within the left pons measuring
approximately 6 mm in diameter concerning for hemorrhage (series 3,
images 10-11). Scattered areas of decreased attenuation within the
periventricular and subcortical white matter, progressed from prior,
including new low-density lesion along the genu of the corpus
callosum on the left (series 6, image 16). No extra-axial
hemorrhage. No hydrocephalus. No evidence of mass lesion or mass
effect.

Vascular: Atherosclerotic calcifications involving the large vessels
of the skull base. No unexpected hyperdense vessel.

Skull: Normal. Negative for fracture or focal lesion.

Sinuses/Orbits: No acute finding.

Other: None.
IMPRESSION: 1. Small focal area of hyperdensity within the pons most suggestive
of hypertensive microhemorrhage.
2. Scattered areas of decreased attenuation within the
periventricular and subcortical white matter, progressed from prior,
including new low-density lesion along the genu of the corpus
callosum on the left. Findings are most suggestive of microvascular
ischemic change and probable chronic microhemorrhages. Consider
further evaluation with MRI of the brain to assess for acute
ischemia.

These results were called by telephone at the time of interpretation
on [DATE] at [DATE] to provider MADDOX, who verbally
acknowledged these results.

## 2021-02-06 MED ORDER — OXYCODONE-ACETAMINOPHEN 5-325 MG PO TABS
1.0000 | ORAL_TABLET | ORAL | Status: AC | PRN
  Administered 2021-02-06 – 2021-02-09 (×2): 1 via ORAL
  Filled 2021-02-06 (×2): qty 1

## 2021-02-06 NOTE — ED Provider Notes (Signed)
6:55 PM Call received from radiologist.  Patient with critical results on her CT including a small infarct.  I called charge nurse at the time of the call.  Unfortunately due to long waiting room waits patient is unable to obtain a bed at this time to be seen.  I have ordered an MRI in hopes to expedite patient's as it.  Also reviewed her notes, it looks like symptoms have been nearly 24 hours ago, she is out of our stroke window at this time.  Will not proceed with stroke work-up.  CT Head w/o contrast:  1. Small focal area of hyperdensity within the pons most suggestive  of hypertensive microhemorrhage.  2. Scattered areas of decreased attenuation within the  periventricular and subcortical white matter, progressed from prior,  including new low-density lesion along the genu of the corpus  callosum on the left. Findings are most suggestive of microvascular  ischemic change and probable chronic microhemorrhages. Consider  further evaluation with MRI of the brain to assess for acute  ischemia.     These results were called by telephone at the time of interpretation  on 02/06/2021 at 6:51 pm to provider Bennie Dallas, who verbally  acknowledged these results.         Portions of this note were generated with Lobbyist. Dictation errors may occur despite best attempts at proofreading.    Janeece Fitting, PA-C 02/06/21 1856    Tegeler, Gwenyth Allegra, MD 02/06/21 [phone]

## 2021-02-06 NOTE — ED Provider Notes (Signed)
Emergency Medicine Provider Triage Evaluation Note  Roberta Calderon , a 50 y.o. female  was evaluated in triage.  Pt complains of lightheadedness, difficulty balancing feeling she is leaning towards her right, difficulty walking on her own, and blurry vision when she woke up this morning.  Patient with history of stroke in the past, has been without her antihypertensive medications for 1 month.  She states that she went to bed sometime last night after 5 PM and was feeling normal and well.  When she woke up this morning at 9 she noticed the symptoms and called EMS later today as they had not resolved.  She does endorse episode of chest pain this morning which has since resolved.  Review of Systems  Positive: Left-sided weakness, imbalance, leaning to the right.  Blurred vision which has resolved Negative: Chest pain at this time though transient episode this morning, shortness of breath, palpitations, headache, blurry vision or double vision at this time.  No recent falls, she is not anticoagulated.  Physical Exam  BP (!) 194/115 (BP Location: Right Arm)   Pulse (!) 107   Temp 98.7 F (37.1 C) (Oral)   Resp 16   SpO2 100%  Gen:   Awake, no distress   Resp:  Normal effort  MSK:   Moves extremities without difficulty  Other:  Left sided weakness relative to right, decreased sensation on the left, EOMI, PERRL, patient with difficulty to stand on her own   Medical Decision Making  Medically screening exam initiated at 5:09 PM.  Appropriate orders placed.  Roberta Calderon was informed that the remainder of the evaluation will be completed by another provider, this initial triage assessment does not replace that evaluation, and the importance of remaining in the ED until their evaluation is complete.  I do have concern this patient may have experienced an acute stroke in context of hypertension, however last known well unclear, possibly nearly 24 hours ago.  For this reason we will proceed  with stroke order set without an activating Code stroke.   This chart was dictated using voice recognition software, Dragon. Despite the best efforts of this provider to proofread and correct errors, errors may still occur which can change documentation meaning.    Roberta Calderon 02/06/21 1724    Arnaldo Natal, MD 02/06/21 1747

## 2021-02-06 NOTE — ED Triage Notes (Signed)
Pt reports dizziness, blurred vision in both eyes, and R sided weakness when she woke up this morning. Was not dizzy when she went to bed last night but she is unable to tell me what time she went to bed. Reports chest pain this morning which has now resolved. Hypertensive with hx of same but has been out of her rx for same for a month.

## 2021-02-07 ENCOUNTER — Inpatient Hospital Stay (HOSPITAL_COMMUNITY): Payer: Self-pay

## 2021-02-07 ENCOUNTER — Other Ambulatory Visit (HOSPITAL_COMMUNITY): Payer: Medicaid Other

## 2021-02-07 DIAGNOSIS — I613 Nontraumatic intracerebral hemorrhage in brain stem: Principal | ICD-10-CM

## 2021-02-07 DIAGNOSIS — I161 Hypertensive emergency: Secondary | ICD-10-CM

## 2021-02-07 DIAGNOSIS — F17200 Nicotine dependence, unspecified, uncomplicated: Secondary | ICD-10-CM

## 2021-02-07 DIAGNOSIS — Z8673 Personal history of transient ischemic attack (TIA), and cerebral infarction without residual deficits: Secondary | ICD-10-CM

## 2021-02-07 DIAGNOSIS — I619 Nontraumatic intracerebral hemorrhage, unspecified: Secondary | ICD-10-CM | POA: Diagnosis present

## 2021-02-07 LAB — LIPID PANEL
Cholesterol: 227 mg/dL — ABNORMAL HIGH (ref 0–200)
HDL: 65 mg/dL (ref >40)
LDL Cholesterol: 142 mg/dL — ABNORMAL HIGH (ref 0–99)
Total CHOL/HDL Ratio: 3.5 RATIO
Triglycerides: 102 mg/dL (ref <150)
VLDL: 20 mg/dL (ref 0–40)

## 2021-02-07 LAB — RAPID URINE DRUG SCREEN, HOSP PERFORMED
Amphetamines: NOT DETECTED
Barbiturates: NOT DETECTED
Benzodiazepines: NOT DETECTED
Cocaine: POSITIVE — AB
Opiates: NOT DETECTED
Tetrahydrocannabinol: NOT DETECTED

## 2021-02-07 LAB — URINALYSIS, ROUTINE W REFLEX MICROSCOPIC
Bilirubin Urine: NEGATIVE
Glucose, UA: 50 mg/dL — AB
Hgb urine dipstick: NEGATIVE
Ketones, ur: NEGATIVE mg/dL
Nitrite: NEGATIVE
Protein, ur: NEGATIVE mg/dL
Specific Gravity, Urine: 1.011 (ref 1.005–1.030)
pH: 6 (ref 5.0–8.0)

## 2021-02-07 LAB — MRSA NEXT GEN BY PCR, NASAL: MRSA by PCR Next Gen: NOT DETECTED

## 2021-02-07 IMAGING — CT CT ANGIO HEAD-NECK (W OR W/O PERF)
2 of 11 series · 6 of 35 positions shown · IV contrast (OMNI 350)
Comparison: None.

CLINICAL DATA: Stroke follow-up.

EXAM:
CT ANGIOGRAPHY HEAD AND NECK
TECHNIQUE: Multidetector CT imaging of the head and neck was performed using
the standard protocol during bolus administration of intravenous
contrast. Multiplanar CT image reconstructions and MIPs were
obtained to evaluate the vascular anatomy. Carotid stenosis
measurements (when applicable) are obtained utilizing NASCET
criteria, using the distal internal carotid diameter as the
denominator.
CONTRAST:  50mL OMNIPAQUE IOHEXOL 350 MG/ML SOLN

[Series 9: cta neck axial · axial · 0.39mm/px · z∈[+1015,+1212]mm · 5 of 297 slices shown]
[im 50/297  soft-tissue]
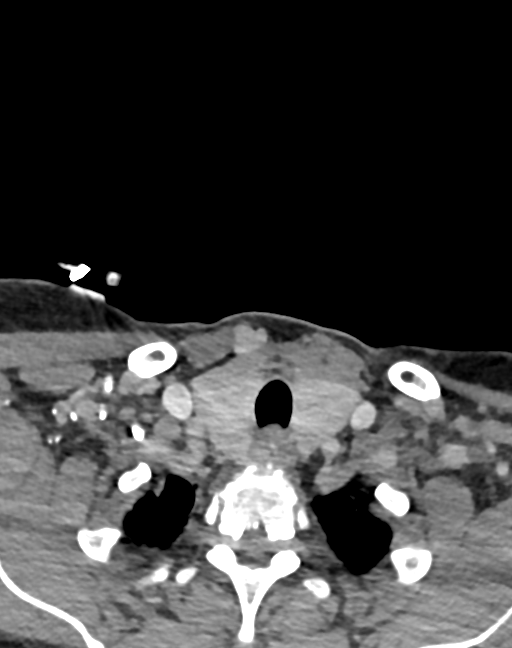
[im 99/297  bone]
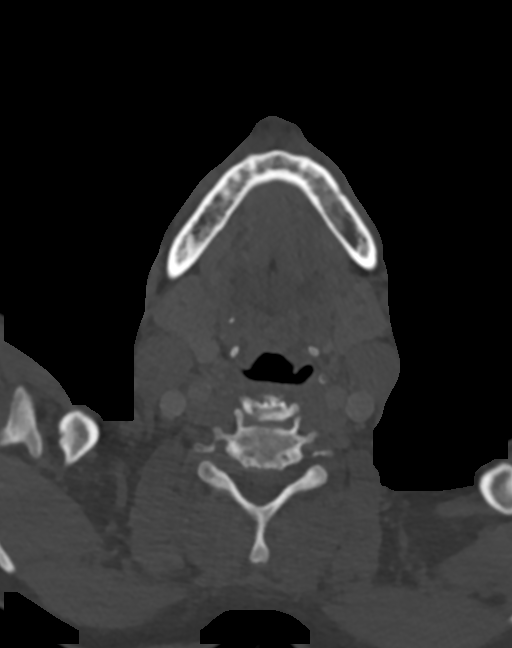
[im 149/297  soft-tissue]
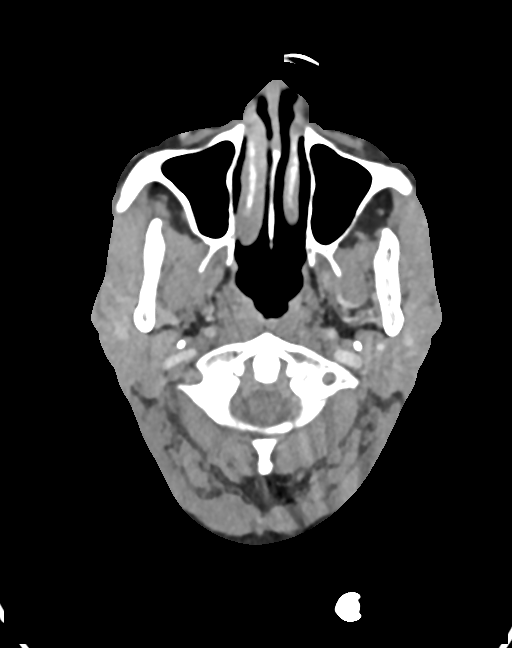
[im 198/297  bone]
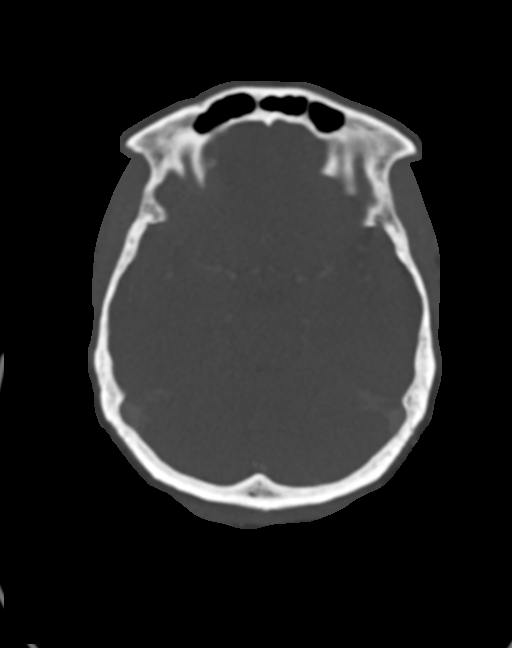
[im 247/297  soft-tissue]
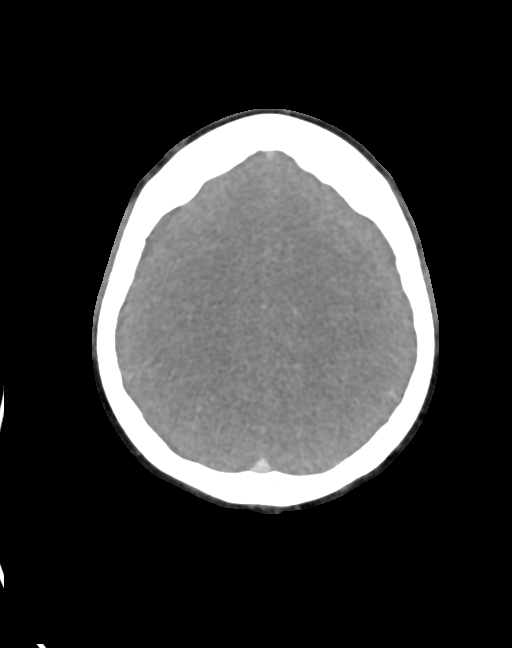

[Series 11: cta neck sagittal · sagittal · 0.48mm/px · 1 of 201 slices shown]
[im 48/201  soft-tissue]
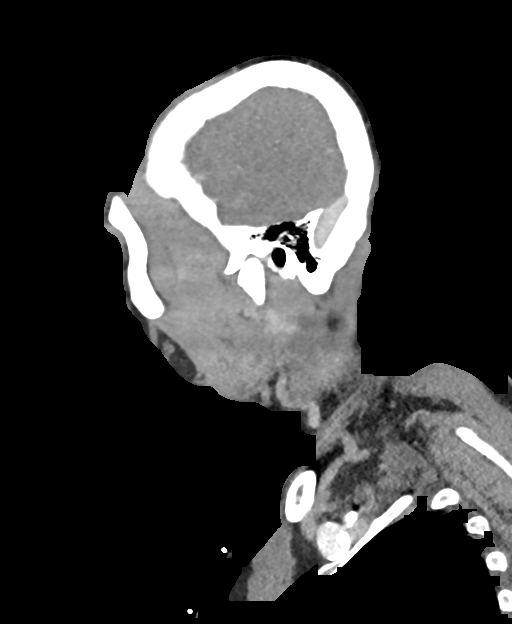

[6 of 35 positions shown; findings below may reference images not displayed]

FINDINGS: CT HEAD FINDINGS

Brain: Similar small focal area of hyperdensity within the left
pons, which could represent hemorrhage or mineralization. Consider
MRI to further evaluate. Similar mulltiple remote appearing lacunar
infarcts in bilateral basal ganglia and thalami with additional age
advanced hypodensities throughout the white matter. No evidence of
interval acute large vascular territory infarct or interval
hemorrhage. No hydrocephalus. No sizable extra-axial fluid
collection.

Vascular: See below.

Skull: No acute fracture.

Sinuses: Visualized sinuses are clear.

Orbits: Unremarkable orbits.

Review of the MIP images confirms the above findings

CTA NECK FINDINGS

Aortic arch: Great vessel origins appear patent.

Carotid arteries: Severely limited evaluation due to venous timing.
Faint apparent opacification of the carotid arteries suggests
patency. There is bilateral carotid bifurcation atherosclerosis,
which is difficult to quantify on this study.

Vertebral arteries: Essentially nondiagnostic evaluation of the
vertebral arteries in the neck with some visible opacification the
upper neck.

Skeleton: Moderate to severe multilevel degenerative change in the
cervical spine.

Other neck: No evidence of acute abnormality.

Upper chest: Emphysema.

Review of the MIP images confirms the above findings

CTA HEAD FINDINGS

Severely limited evaluation due to venous timing and motion. Within
this limitation

Anterior circulation: Bilateral ICAs and proximal MCAs and ACAs
appear patent. Nondiagnostic evaluation for stenosis or more distal
occlusion.

Posterior circulation: Bilateral intradural vertebral arteries,
basilar artery, and proximal posterior cerebral arteries appear
patent. More distal PCAs are poorly evaluated due to venous timing.

Venous sinuses: No evidence of dural venous sinus thrombosis.

Review of the MIP images confirms the above findings
IMPRESSION: CT head:

1. Similar small focal area of hyperdensity within the left pons,
which could represent hemorrhage or mineralization. Consider MRI to
further evaluate.
2. Similar mulltiple remote appearing lacunar infarcts in bilateral
basal ganglia and thalami with additional age advanced hypodensities
throughout the white matter, which are nonspecific but potentially
secondary to chronic microvascular ischemic disease. Many these
areas are new from prior CT in [NI] and a MRI could further
characterize and better evaluate for acute infarct if clinically
indicated.

CTA head:

Severely limited evaluation due to venous timing and motion. The
proximal large vessels appear patent intracranially with
nondiagnostic evaluation distally and nondiagnostic evaluation for
stenosis.

CTA neck:

1. Severely limited evaluation due to venous timing. Faint apparent
opacification of the carotid arteries suggests patency. There is
bilateral carotid bifurcation atherosclerosis with the degree of
stenosis difficult to quantify on this study. Essentially
nondiagnostic evaluation of the vertebral arteries in the neck with
visible opacification in the upper neck.
2. Emphysema.

## 2021-02-07 IMAGING — DX DG CHEST 1V
1 series · 1 of 1 positions shown · non-contrast
Comparison: [DATE]

CLINICAL DATA: Stroke

EXAM:
CHEST  1 VIEW

[chest ap]
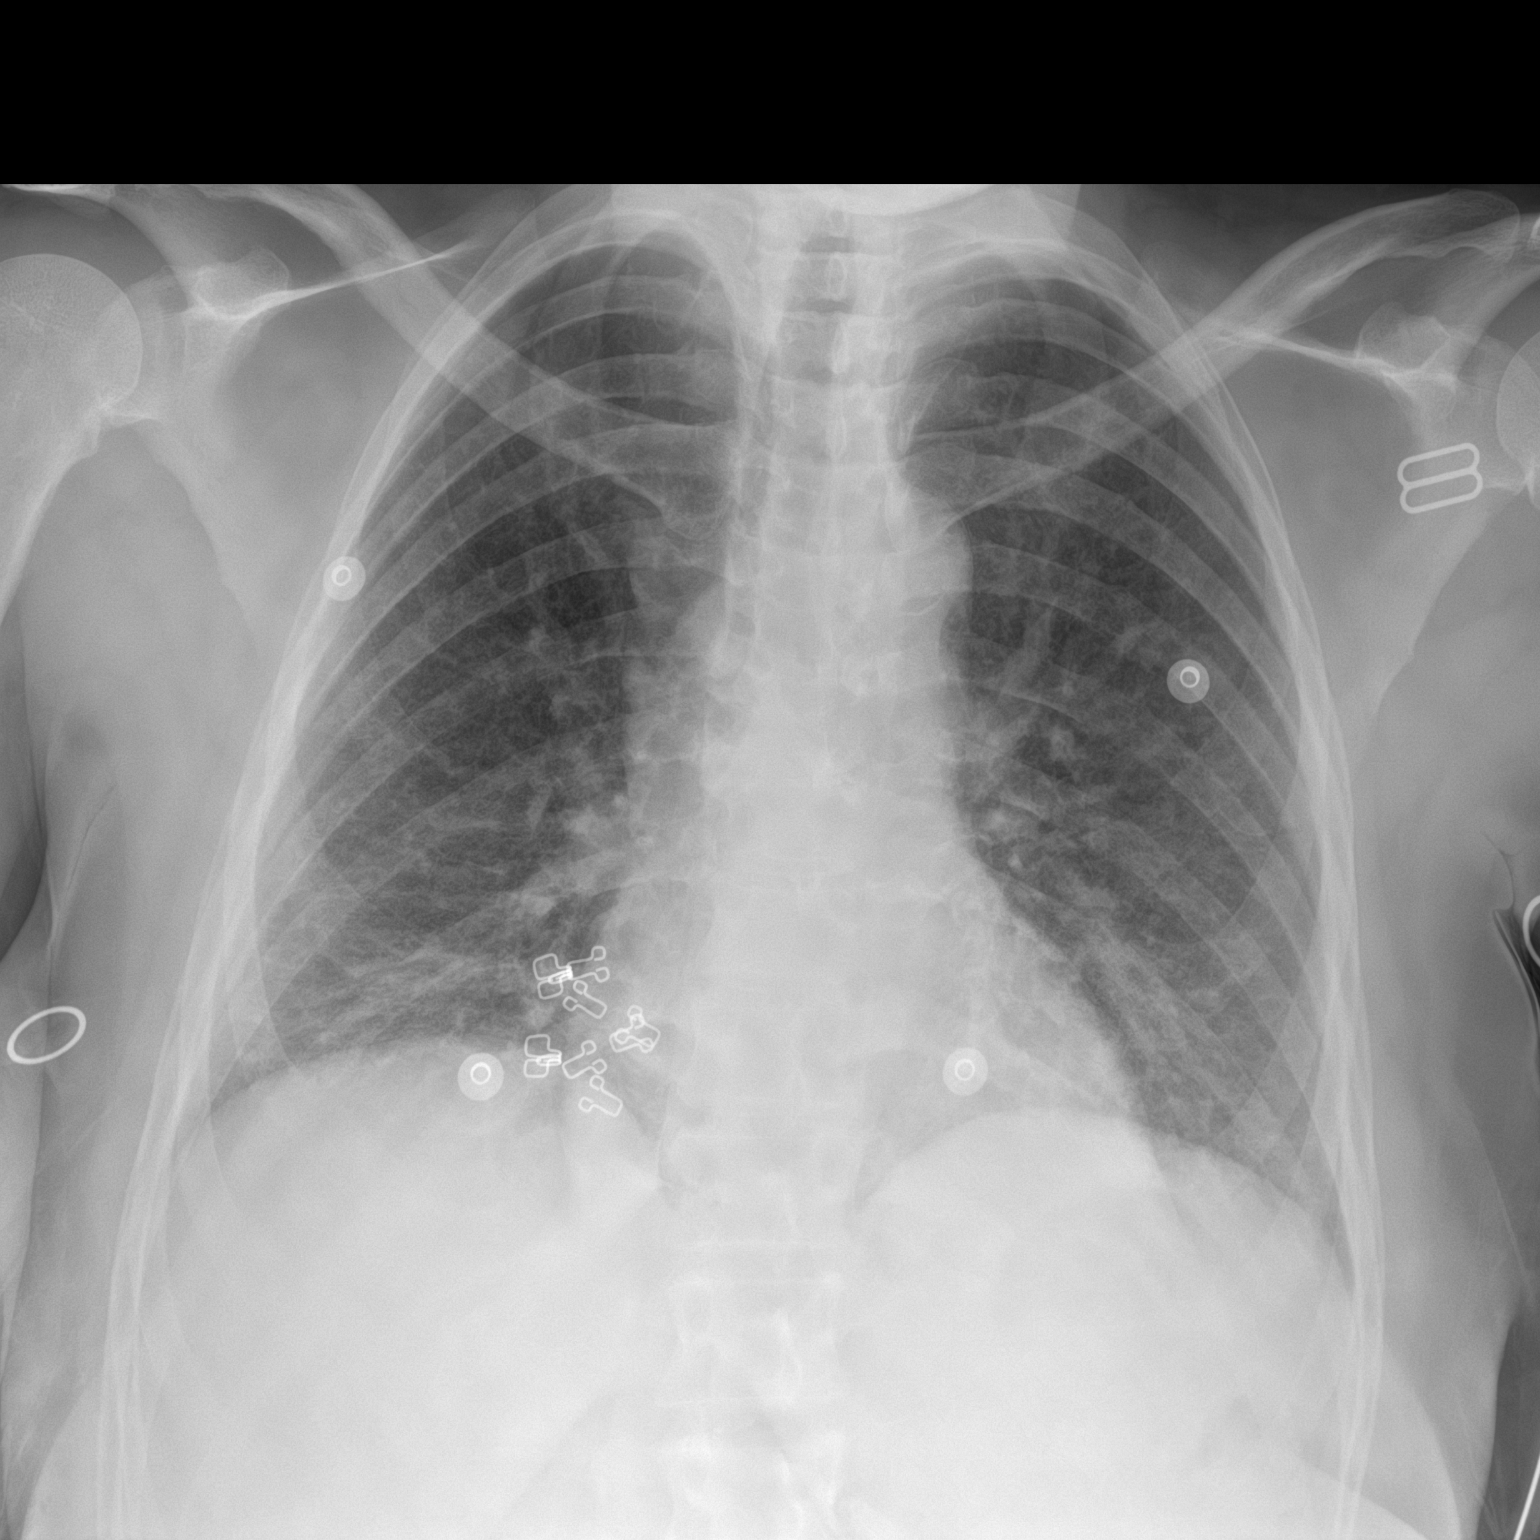

[1 of 1 positions shown; findings below may reference images not displayed]

FINDINGS: Heart is normal size. Right base atelectasis. No confluent opacity
on the left. No effusions or acute bony abnormality.
IMPRESSION: Right base atelectasis.

## 2021-02-07 MED ORDER — ACETAMINOPHEN 650 MG RE SUPP: 650.0000 mg | RECTAL | Status: DC | PRN

## 2021-02-07 MED ORDER — STROKE: EARLY STAGES OF RECOVERY BOOK: Freq: Once | Status: DC

## 2021-02-07 MED ORDER — ACETAMINOPHEN 160 MG/5ML PO SOLN: 650.0000 mg | ORAL | Status: DC | PRN

## 2021-02-07 MED ORDER — LABETALOL HCL 5 MG/ML IV SOLN
5.0000 mg | INTRAVENOUS | Status: DC | PRN
  Administered 2021-02-08: 20 mg via INTRAVENOUS
  Filled 2021-02-07 (×2): qty 4

## 2021-02-07 MED ORDER — IOHEXOL 350 MG/ML SOLN
50.0000 mL | Freq: Once | INTRAVENOUS | Status: AC | PRN
  Administered 2021-02-07: 50 mL via INTRAVENOUS

## 2021-02-07 MED ORDER — PANTOPRAZOLE SODIUM 40 MG PO TBEC
40.0000 mg | DELAYED_RELEASE_TABLET | Freq: Every day | ORAL | Status: DC
  Administered 2021-02-07 – 2021-02-09 (×3): 40 mg via ORAL
  Filled 2021-02-07 (×3): qty 1

## 2021-02-07 MED ORDER — NICARDIPINE HCL IN NACL 20-0.86 MG/200ML-% IV SOLN: 3.0000 mg/h | INTRAVENOUS | Status: DC

## 2021-02-07 MED ORDER — SENNOSIDES-DOCUSATE SODIUM 8.6-50 MG PO TABS
1.0000 | ORAL_TABLET | Freq: Two times a day (BID) | ORAL | Status: DC
  Administered 2021-02-07 – 2021-02-09 (×3): 1 via ORAL
  Filled 2021-02-07 (×3): qty 1

## 2021-02-07 MED ORDER — CHLORHEXIDINE GLUCONATE CLOTH 2 % EX PADS
6.0000 | MEDICATED_PAD | Freq: Every day | CUTANEOUS | Status: DC
  Administered 2021-02-07 – 2021-02-08 (×2): 6 via TOPICAL

## 2021-02-07 MED ORDER — PANTOPRAZOLE SODIUM 40 MG IV SOLR: 40.0000 mg | Freq: Every day | INTRAVENOUS | Status: DC

## 2021-02-07 MED ORDER — LABETALOL HCL 5 MG/ML IV SOLN
20.0000 mg | Freq: Once | INTRAVENOUS | Status: AC
  Administered 2021-02-07: 20 mg via INTRAVENOUS
  Filled 2021-02-07: qty 4

## 2021-02-07 MED ORDER — LISINOPRIL 5 MG PO TABS
5.0000 mg | ORAL_TABLET | Freq: Every day | ORAL | Status: DC
  Administered 2021-02-07 – 2021-02-08 (×2): 5 mg via ORAL
  Filled 2021-02-07 (×2): qty 1

## 2021-02-07 MED ORDER — CLEVIDIPINE BUTYRATE 0.5 MG/ML IV EMUL
0.0000 mg/h | INTRAVENOUS | Status: DC
  Administered 2021-02-07: 10 mg/h via INTRAVENOUS
  Administered 2021-02-07: 15 mg/h via INTRAVENOUS
  Administered 2021-02-07: 1 mg/h via INTRAVENOUS
  Administered 2021-02-07: 20 mg/h via INTRAVENOUS
  Filled 2021-02-07 (×4): qty 50

## 2021-02-07 MED ORDER — ACETAMINOPHEN 325 MG PO TABS
650.0000 mg | ORAL_TABLET | ORAL | Status: DC | PRN
  Administered 2021-02-08: 650 mg via ORAL
  Filled 2021-02-07: qty 2

## 2021-02-07 NOTE — Progress Notes (Addendum)
STROKE TEAM PROGRESS NOTE    Interval History   No acute events overnight, patient is frustrated with her IV and asks if it can be moved to her other arm as it is hurting.   Neurological exam is stable however limited as patient does not want to participate in the physical examination.   CTA Head and Neck ordered to evaluate for vascular malformations + evaluate bleed stability. Plan for MRI Brain tomorrow.   Pertinent Lab Work and Imaging    02/06/21 CT Head WO IV Contrast 1. Small focal area of hyperdensity within the pons most suggestive of hypertensive microhemorrhage. 2. Scattered areas of decreased attenuation within the periventricular and subcortical white matter, progressed from prior, including new low-density lesion along the genu of the corpus callosum on the left. Findings are most suggestive of microvascular ischemic change and probable chronic microhemorrhages. Consider further evaluation with MRI of the brain to assess for acute ischemia.  02/07/21 CTA Head and Neck  Pending   MRI Brain WO IV Contrast Pending   Echocardiogram Complete  Pending   Physical Examination   Constitutional: Calm, appropriate for condition  Cardiovascular: Normal RR Respiratory: No increased WOB   Mental status: AAOx4, following commands  Speech: Fluent, repetition and naming intact  Cranial nerves: EOMI, VFF, Face appears symmetric (she does not participate when asked to smile) Tongue midline  Motor: Normal bulk and tone. No drift. Unable to do formal strength testing due to lack of patient cooperation. Grip and forearm extension/flexion seem to be 4/5 on the left and 5/5 on the right. She is antigravity in both legs.  Sensory: Decreased sensation to light tough to left arm and left leg  Coordination: UTA given lack of cooperation  Gait: Deferred given lack of cooperation   NIHSS limited due to lack of cooperation but will give her a 5 ( 2 for right leg 2 for left leg and 1 for  sensation)   Assessment and Plan   Roberta Calderon is a 50 y.o. 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   NEURO #Left Pontine Bleed  Patient presented with the symptoms described above. Initial CTH showed a focal area of hyper density within the left pons measuring approximately 6 mm in diameter concerning for hemorrhage. Are evaluating for vascular malformations that could have contributed to bleed with MRI Brain + CTA Head and Neck- imaging studies are pending at this time. UDS + UA ordered today. Of note, blood pressure upon admission was in the 190s. Patient was not taking home blood pressure medications. She was placed on a Clevidipine gtt for BP control. Bleed likely in the setting of hypertension however will evaluate for vascular anomalies as a part of bleed work up.  - No antiplatelets given bleed  - SBP goal < 160  - Follow up CTA Head and Neck + MRI Brain  -At discharge will place ambulatory referral to neurology for stroke follow up   CARDS #Hypertension She has a history of HTN and was prescribed Lisinopril 5 mg + Amlodipine 10 mg at home. Currently blood pressures are trending normotensive. Given bleed blood pressure goal is < 160.  - Lisinopril 5 mg initiated today with plan to initiate Amlodipine as she comes off the Clevidipine gtt  - Wean off Clevidipine gtt   #Hyperlipidemia From a stroke prevention stand point, the LDL goal is < 70. LDL is  142, will initiate home Atorvastatin 80 mg   RESP Oxygenating well on RA. No acute respiratory issues at this time  RENAL Cr and lytes WNL, following renal function on BMP   GI  Swallow eval ordered   ENDO #Stroke Diabetes Screening  Hemoglobin A1C this admission pending   HEME Hemoglobin hematocrit and platelet count stable   ID Afebrile. No infectious processes at this  time.   Hospital day # 0  Roberta Hinds, NP  [facility] Nurse Practitioner Patient seen and discussed with attending physician Dr. Erlinda Hong   ATTENDING NOTE: I reviewed above note and agree with the assessment and plan. Pt was seen and examined.   50 year old female with history of hypertension, smoker, alcohol abuse, substance abuse admitted for difficulty walking, imbalance and left-sided weakness.  CT head concerning for left pontine small ICH.  Repeat CT head stable hyperdensity seen no, hemorrhage versus mineralization.  Both CTs also raise question about progressive small vessel chronic infarcts.  MRI pending in a.m. for further evaluation.  CTA head and neck Limited evaluation due to bolus timing and motion, but no LVO.  Will do carotid Doppler. 2D echo pending, LDL 142, A1c pending, UDS pending.  Patient has stroke in 10/2019 with left-sided weakness and blurry vision.  CT and MRI showed acute right internal capsule infarct, as well as chronic bilateral BG infarcts..  CTA head and neck unremarkable.  EF 65 to 70%.  LDL 173, A1c 6.3, UDS positive for cocaine and benzo.  Discharged on DAPT and Lipitor 80.  On exam today, RN at bedside, patient awake alert orientated x3, fluent speech, no aphasia, follows simple commands.  Able to name and repeat.  No gaze palsy, no diplopia or eye disconjugate or nystagmus.  Visual fields full.  Facial symmetrical.  Right upper and lower extremity 5/5.  Left upper and lower extremity 4/5 with lack of effort on exam.  Left face arm and leg subjectively decreased light touch sensation.  Finger-to-nose intact bilaterally.  Gait not tested.  Given concern for pontine ICH but stable on repeat CT, BP goal less than 160, resume home lisinopril, taper off Cleviprex as able.  On diet.  PT/OT pending.  For detailed assessment and plan, please refer to above as I have made changes wherever appropriate.   Rosalin Hawking, MD PhD Stroke Neurology 02/07/2021 1:12  PM  This patient is critically ill due to possible pontine ICH, hypertensive emergency and at significant risk of neurological worsening, death form hematoma expansion, recurrent stroke, hypertensive encephalopathy, seizure. This patient's care requires constant monitoring of vital signs, hemodynamics, respiratory and cardiac monitoring, review of multiple databases, neurological assessment, discussion with family, other specialists and medical decision making of high complexity. I spent 40 minutes of neurocritical care time in the care of this patient.     To contact Stroke Continuity provider, please refer to [email or web address] After hours, contact General Neurology

## 2021-02-07 NOTE — Progress Notes (Signed)
Pt has slippers, t-shirt, bra, pajama pants, gold chain and black purse at bedside.

## 2021-02-07 NOTE — Progress Notes (Signed)
OT Cancellation Note  Patient Details Name: Roberta Calderon MRN: [id number] DOB: 22-Jan-1971   Cancelled Treatment:    Reason Eval/Treat Not Completed: Patient declined, no reason specified  Roberta Calderon 02/07/2021, 1:57 PM

## 2021-02-07 NOTE — H&P (Signed)
Neurology H&P  CC: Weakness, Blurred vision, dizziness  History is obtained from: Patient, chart  HPI: Roberta Calderon is a 50 y.o. female past medical history of hypertension, ischemic strokes, noncompliant to medications, presenting with 2 to 3-day history of multitude of symptoms: Weakness, difficulty walking, blurred vision and feeling that her body is shutting down. She reports that she is allergic to aspirin and was recommended to take aspirin after her strokes but has been noncompliant.  She also does not take any blood pressure medications but does not see a doctor. 2 or 3 days ago, she started feeling somewhat sick-no headaches but felt that the left arm and leg are not acting as strongly as they usually do.  After her last stroke, which had some left-sided weakness, she had made a near complete recovery with minimal to no residual left-sided weakness.  She started feeling difficulty walking-feeling off balance and swerving to the left. She came to the emergency room, was sent for a CT head which was concerning for a pontine area of a small bleeding and some new areas of infarction compared to her prior CT. She was finally boarded in the room and neurological consultation was obtained. She came in hypotensive with systolic blood pressures highest in the 190s. She denies headaches at this time. Reports shortness of breath ongoing for the past week or so. Reports cocaine abuse in the past-last use 6 months ago.   LKW: 2 to 3 days ago tpa given?: no, ICH Premorbid modified Rankin scale (mRS): 1 ICH Score: 1-for infratentorial   ROS: Full ROS was performed and is negative except as noted in the HPI.   Past Medical History:  Diagnosis Date   Hypertension         No family history on file.   Social History:   reports that she has been smoking cigarettes. She has been smoking an average of 0.25 packs per day. She has never used smokeless tobacco. She reports current alcohol  use. She reports that she does not use drugs. Prior cocaine use - denies current use for atleast 6 mo  Medications  Current Facility-Administered Medications:     stroke: mapping our early stages of recovery book, , Does not apply, Once, Amie Portland, MD   acetaminophen (TYLENOL) tablet 650 mg, 650 mg, Oral, Q4H PRN **OR** acetaminophen (TYLENOL) 160 MG/5ML solution 650 mg, 650 mg, Per Tube, Q4H PRN **OR** acetaminophen (TYLENOL) suppository 650 mg, 650 mg, Rectal, Q4H PRN, Amie Portland, MD   labetalol (NORMODYNE) injection 20 mg, 20 mg, Intravenous, Once **AND** clevidipine (CLEVIPREX) infusion 0.5 mg/mL, 0-21 mg/hr, Intravenous, Continuous, Amie Portland, MD   oxyCODONE-acetaminophen (PERCOCET/ROXICET) 5-325 MG per tablet 1 tablet, 1 tablet, Oral, Q30 min PRN, Ripley Fraise, MD, 1 tablet at 02/06/21 2207   pantoprazole (PROTONIX) injection 40 mg, 40 mg, Intravenous, QHS, Amie Portland, MD   senna-docusate (Senokot-S) tablet 1 tablet, 1 tablet, Oral, BID, Amie Portland, MD  Current Outpatient Medications:    acetaminophen (TYLENOL) 325 MG tablet, Take 2 tablets (650 mg total) by mouth every 4 (four) hours as needed for mild pain (or temp > 37.5 C (99.5 F)). (Patient not taking: No sig reported), Disp: , Rfl:    amLODipine (NORVASC) 10 MG tablet, Take 1 tablet (10 mg total) by mouth daily. (Patient not taking: Reported on 02/07/2021), Disp: 30 tablet, Rfl: 2   atorvastatin (LIPITOR) 80 MG tablet, Take 1 tablet (80 mg total) by mouth daily. (Patient not taking: Reported on 02/07/2021), Disp:  30 tablet, Rfl: 2   lisinopril (ZESTRIL) 5 MG tablet, Take 1 tablet (5 mg total) by mouth daily. (Patient not taking: No sig reported), Disp: 30 tablet, Rfl: 0   nicotine (NICODERM CQ - DOSED IN MG/24 HR) 7 mg/24hr patch, One Patch daily x3 weeks and stop (Patient not taking: Reported on 02/07/2021), Disp: 28 patch, Rfl: 0   Exam: Current vital signs: BP (!) 169/111 (BP Location: Right Arm)   Pulse (!)  108   Temp 97.7 F (36.5 C) (Oral)   Resp 16   SpO2 100%  Vital signs in last 24 hours: Temp:  [97.7 F (36.5 C)-98.7 F (37.1 C)] 97.7 F (36.5 C) (06/13 2037) Pulse Rate:  [103-108] 108 (06/13 2335) Resp:  [16-20] 16 (06/13 2335) BP: (169-194)/(103-115) 169/111 (06/13 2335) SpO2:  [97 %-100 %] 100 % (06/13 2335) General: Awake alert in no distress HEENT: Normocephalic atraumatic Lungs: Clear Cardiovascular: Regular rate rhythm Abdomen nondistended nontender Extremities warm well perfused no edema Vascular Awake alert oriented x3 No dysarthria Aphasia Cranial nerves II through XII intact Motor examination with left upper and lower extremity with mild weakness-no vertical drift. Sensory exam with diminished light touch on the left in comparison to the right with no extinction. Coordination: No dysmetria on left finger-nose-finger testing but mild dysmetria on left heel-knee-shin testing. NIHSS 1a Level of Conscious.: 0 1b LOC Questions: 0 1c LOC Commands: 0 2 Best Gaze: 0 3 Visual: 0 4 Facial Palsy: 0 5a Motor Arm - left: 0 5b Motor Arm - Right: 0 6a Motor Leg - Left: 0 6b Motor Leg - Right: 0 7 Limb Ataxia: 1 8 Sensory: 1 9 Best Language: 0 10 Dysarthria: 0 11 Extinct. and Inatten.: 0 TOTAL: 2  Labs I have reviewed labs in epic and the results pertinent to this consultation are:  PT/INR 12.5/0.9  CBC    Component Value Date/Time   WBC 5.8 02/06/2021 1854   RBC 6.00 (H) 02/06/2021 1854   HGB 15.3 (H) 02/06/2021 1854   HCT 49.3 (H) 02/06/2021 1854   PLT 343 02/06/2021 1854   MCV 82.2 02/06/2021 1854   MCH 25.5 (L) 02/06/2021 1854   MCHC 31.0 02/06/2021 1854   RDW 15.3 02/06/2021 1854   LYMPHSABS 2.2 02/06/2021 1854   MONOABS 0.5 02/06/2021 1854   EOSABS 0.1 02/06/2021 1854   BASOSABS 0.0 02/06/2021 1854    CMP     Component Value Date/Time   NA 138 02/06/2021 1854   K 4.6 02/06/2021 1854   CL 104 02/06/2021 1854   CO2 24 02/06/2021 1854    GLUCOSE 78 02/06/2021 1854   BUN 11 02/06/2021 1854   CREATININE 0.94 02/06/2021 1854   CALCIUM 9.6 02/06/2021 1854   PROT 7.4 02/06/2021 1854   ALBUMIN 3.9 02/06/2021 1854   AST 32 02/06/2021 1854   ALT 17 02/06/2021 1854   ALKPHOS 98 02/06/2021 1854   BILITOT 0.6 02/06/2021 1854   GFRNONAA >60 02/06/2021 1854   GFRAA >60 04/11/2020 0810    Lipid Panel     Component Value Date/Time   CHOL 254 (H) 04/05/2020 1005   TRIG 95 04/05/2020 1005   HDL 62 04/05/2020 1005   CHOLHDL 4.1 04/05/2020 1005   VLDL 19 04/05/2020 1005   LDLCALC 173 (H) 04/05/2020 1005     Imaging I have reviewed the images obtained:  CT-scan of the brain-small focal area of hyperdensity within the pons suggestive of hypertensive hemorrhage.  Scattered areas of decreased attenuation within the  periventricular and subcortical white matter progressed from prior including new low-density along the genu of the corpus callosum on the left.  Microvascular ischemic change versus subacute ischemia versus chronic microhemorrhages.  Assessment: 50 year old patient with known history of hypertension, prior ischemic strokes, secondary to small vessel etiology, prior cocaine abuse history, presenting for evaluation of left-sided weakness, left-sided sensory deficits and imbalance with CT of the head with a small pontine bleed as well as concern for early subacute infarct versus rapidly progressing small vessel chronic microvascular damage. Etiology-nontraumatic ICH likely secondary to uncontrolled hypertension versus hemorrhagic transformation of ischemic infarction.   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  ENDO Check A1c Goal A1c less than 7  Fluid/Electrolyte Disorders No active issues Check BMP in the morning and replete electrolytes as necessary  ID No active issues    Prophylaxis DVT: SCDs only-no antiplatelets or anticoagulants due to [place] GI: PPI Bowel: Docusate senna  Dispo: TBD  Diet: NPO until cleared by speech  Code Status: Full Code   THE FOLLOWING WERE PRESENT ON ADMISSION: Intracerebral hemorrhage, hemiparesis, noncompliance to medications, hypertensive emergency, essential hypertension   CRITICAL CARE ATTESTATION Performed by: Amie Portland, MD Total critical care time: 36 minutes Critical care time was exclusive of separately billable procedures and treating other patients and/or supervising APPs/Residents/Students Critical care was necessary to treat or prevent imminent or life-threatening deterioration due to [place], HTN emergency  This patient is critically ill and at significant risk for neurological worsening and/or death and care requires constant monitoring. Critical care was time spent personally by me on the following activities: development of treatment plan with patient and/or surrogate as well as nursing, discussions with consultants, evaluation of patient's response to treatment, examination of patient, obtaining history from patient or surrogate, ordering and performing  treatments and interventions, ordering and review of laboratory studies, ordering and review of radiographic studies, pulse oximetry, re-evaluation of patient's condition, participation in multidisciplinary rounds and medical decision making of high complexity in the care of this patient.

## 2021-02-07 NOTE — Evaluation (Signed)
Physical Therapy Evaluation Patient Details Name: Roberta Calderon MRN: [id number] DOB: [birth year] Today's Date: 02/07/2021   History of Present Illness  Pt is a 50 y.o. 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 Significant PMH: HTN, ischemic strokes.  Clinical Impression  Prior to admission, pt states she lives with her mother and is independent; does not work. Pt presents with decreased functional mobility secondary to dizziness, mild left sided weakness and decreased coordination and sensation impairment. Pt able to sit up on the side of the bed without physical assist, but quickly lying back down due to consistent dizziness. BP 125/78; pt reports improvement with lying flat. Pt refusing further assessment. RN reporting pt has been ambulatory to bathroom with min assist. Will follow up tomorrow to further assess gait and balance.     Follow Up Recommendations Outpatient PT    Equipment Recommendations   (TBA)    Recommendations for Other Services       Precautions / Restrictions Precautions Precautions: Fall;Other (comment) Precaution Comments: BP < 140 Restrictions Weight Bearing Restrictions: No      Mobility  Bed Mobility Overal bed mobility: Modified Independent                  Transfers                 General transfer comment: deferred by pt  Ambulation/Gait                Stairs            Wheelchair Mobility    Modified Rankin (Stroke Patients Only) Modified Rankin (Stroke Patients Only) Pre-Morbid Rankin Score: No symptoms Modified Rankin: Moderately severe disability     Balance Overall balance assessment: Needs assistance Sitting-balance support: Feet unsupported Sitting balance-Leahy Scale: Good                                       Pertinent Vitals/Pain Pain Assessment: No/denies pain    Home Living Family/patient expects to be discharged  to:: Private residence Living Arrangements: Parent (mother) Available Help at Discharge: Family;Available 24 hours/day Type of Home: House Home Access: Stairs to enter   [employer] of Steps: 3 Home Layout: One level        Prior Function Level of Independence: Independent         Comments: States she "cannot work."     Journalist, newspaper   Dominant Hand: Right    Extremity/Trunk Assessment   Upper Extremity Assessment Upper Extremity Assessment: Defer to OT evaluation    Lower Extremity Assessment Lower Extremity Assessment: RLE deficits/detail;LLE deficits/detail RLE Deficits / Details: WFL LLE Deficits / Details: At least anti gravity strength LLE Sensation: decreased light touch    Cervical / Trunk Assessment Cervical / Trunk Assessment: Normal  Communication   Communication: No difficulties  Cognition Arousal/Alertness: Awake/alert Behavior During Therapy: Flat affect Overall Cognitive Status: Difficult to assess                                 General Comments: A&Ox4, initially answering questions, but eventually not responding or participating due to lack of wanting to work with PT seemingly rather than cognitive deficit      General Comments      Exercises  Assessment/Plan    PT Assessment Patient needs continued PT services  PT Problem List Decreased strength;Decreased activity tolerance;Decreased balance;Decreased mobility;Decreased coordination;Impaired sensation       PT Treatment Interventions Gait training;Stair training;Functional mobility training;Therapeutic activities;Therapeutic exercise;Balance training;Patient/family education    PT Goals (Current goals can be found in the Care Plan section)  Acute Rehab PT Goals Patient Stated Goal: to lie flat PT Goal Formulation: With patient Time For Goal Achievement: 02/21/21 Potential to Achieve Goals: Good    Frequency Min 4X/week   Barriers to discharge         Co-evaluation               AM-PAC PT "6 Clicks" Mobility  Outcome Measure Help needed turning from your back to your side while in a flat bed without using bedrails?: None Help needed moving from lying on your back to sitting on the side of a flat bed without using bedrails?: None Help needed moving to and from a bed to a chair (including a wheelchair)?: A Little Help needed standing up from a chair using your arms (e.g., wheelchair or bedside chair)?: A Little Help needed to walk in hospital room?: A Little Help needed climbing 3-5 steps with a railing? : A Little 6 Click Score: 20    End of Session   Activity Tolerance: Other (comment) (deferred by pt dizziness) Patient left: in bed;with call bell/phone within reach;with bed alarm set Nurse Communication: Mobility status PT Visit Diagnosis: Unsteadiness on feet (R26.81);Hemiplegia and hemiparesis Hemiplegia - Right/Left: Left Hemiplegia - dominant/non-dominant: Non-dominant Hemiplegia - caused by: Nontraumatic intracerebral hemorrhage    Time: 0859-0916 PT Time Calculation (min) (ACUTE ONLY): 17 min   Charges:   PT Evaluation $PT Eval Low Complexity: [place], PT, DPT Acute Rehabilitation Services Pager [phone] Office [phone]   Deno Etienne 02/07/2021, 12:52 PM

## 2021-02-07 NOTE — Progress Notes (Signed)
Carotid duplex has been completed.   Preliminary results in CV Proc.   Abram Sander 02/07/2021 2:50 PM

## 2021-02-07 NOTE — ED Provider Notes (Signed)
[facility] EMERGENCY DEPARTMENT Provider Note   CSN: [id number] Arrival date & time: 02/06/21  1624     History Chief Complaint  Patient presents with   Dizziness    Roberta Calderon is a 50 y.o. female.  The history is provided by the patient and medical records.  Dizziness  50 y.o. F with hx of prior stroke, substance abuse, HTN, HLP, presenting to the ED with dizziness.  States yesterday she noticed that she felt "off" but cannot give me an exact time this started.  States she thought her BP was elevated as she has been out of her meds for the past week (script lapsed and she never got it refilled).  States she decided to go to bed early around 10PM in hopes that she would feel better after resting.  States woke up this morning a little before 9am without improvement.  Throughout the day today she felt like symptoms worsened, feels more generally weak, unsteady on her feet, and like she is dragging her legs (both of them) when trying to walk.  Also reports pain and "tingling" in left hand.  She reports history of prior strokes and has felt like this before with them.  She is not currently on any type of anticoagulation, she does have ASA allergy.    Past Medical History:  Diagnosis Date   Hypertension     Patient Active Problem List   Diagnosis Date Noted   Dyslipidemia    Right thalamic infarction ([place]) 04/08/2020   Prediabetes    Polysubstance abuse ([place])    Hemiparesis affecting left side as late effect of stroke ([place])    Ischemic stroke ([place]) 04/06/2020   Hypertension 04/06/2020   Tobacco use disorder 04/06/2020    No past surgical history on file.   OB History   No obstetric history on file.     No family history on file.  Social History   Tobacco Use   Smoking status: Every Day    Packs/day: 0.25    Pack years: 0.00    Types: Cigarettes   Smokeless tobacco: Never  Substance Use Topics   Alcohol use: Yes   Drug use: No    Home  Medications Prior to Admission medications   Medication Sig Start Date End Date Taking? Authorizing Provider  acetaminophen (TYLENOL) 325 MG tablet Take 2 tablets (650 mg total) by mouth every 4 (four) hours as needed for mild pain (or temp > 37.5 C (99.5 F)). Patient not taking: Reported on 07/20/2020 04/12/20   Angiulli, Lavon Paganini, PA-C  amLODipine (NORVASC) 10 MG tablet Take 1 tablet (10 mg total) by mouth daily. 04/12/20 07/11/20  Angiulli, Lavon Paganini, PA-C  atorvastatin (LIPITOR) 80 MG tablet Take 1 tablet (80 mg total) by mouth daily. 04/12/20 07/11/20  Angiulli, Lavon Paganini, PA-C  lisinopril (ZESTRIL) 5 MG tablet Take 1 tablet (5 mg total) by mouth daily. Patient not taking: Reported on 07/20/2020 04/13/20   Angiulli, Lavon Paganini, PA-C  nicotine (NICODERM CQ - DOSED IN MG/24 HR) 7 mg/24hr patch One Patch daily x3 weeks and stop 04/12/20   Angiulli, Lavon Paganini, PA-C    Allergies    Nsaids and Aspirin  Review of Systems   Review of Systems  Neurological:  Positive for dizziness.  All other systems reviewed and are negative.  Physical Exam Updated Vital Signs BP (!) 169/111 (BP Location: Right Arm)   Pulse (!) 108   Temp 97.7 F (36.5 C) (Oral)  Resp 16   SpO2 100%   Physical Exam Vitals and nursing note reviewed.  Constitutional:      Appearance: She is well-developed.  HENT:     Head: Normocephalic and atraumatic.  Eyes:     Conjunctiva/sclera: Conjunctivae normal.     Pupils: Pupils are equal, round, and reactive to light.  Cardiovascular:     Rate and Rhythm: Normal rate and regular rhythm.     Heart sounds: Normal heart sounds.  Pulmonary:     Effort: Pulmonary effort is normal. No respiratory distress.     Breath sounds: Normal breath sounds. No rhonchi.  Abdominal:     General: Bowel sounds are normal.     Palpations: Abdomen is soft.  Musculoskeletal:        General: Normal range of motion.     Cervical back: Normal range of motion.  Skin:    General: Skin is warm and  dry.  Neurological:     Mental Status: She is alert and oriented to person, place, and time.     Comments: AAOx3, answering questions and following commands appropriately; seems a bit more weak on left during my exam and reports more difficulty moving left extremities; CN grossly intact; no significant ataxia noted, speech is clear and goal oriented    ED Results / Procedures / Treatments   Labs (all labs ordered are listed, but only abnormal results are displayed) Labs Reviewed  CBC - Abnormal; Notable for the following components:      Result Value   RBC 6.00 (*)    Hemoglobin 15.3 (*)    HCT 49.3 (*)    MCH 25.5 (*)    All other components within normal limits  I-STAT CHEM 8, ED - Abnormal; Notable for the following components:   Hemoglobin 17.0 (*)    HCT 50.0 (*)    All other components within normal limits  RESP PANEL BY RT-PCR (FLU A&B, COVID) ARPGX2  ETHANOL  PROTIME-INR  APTT  DIFFERENTIAL  COMPREHENSIVE METABOLIC PANEL  MAGNESIUM  RAPID URINE DRUG SCREEN, HOSP PERFORMED  URINALYSIS, ROUTINE W REFLEX MICROSCOPIC  I-STAT BETA HCG BLOOD, ED (MC, WL, AP ONLY)  TROPONIN I (HIGH SENSITIVITY)  TROPONIN I (HIGH SENSITIVITY)    EKG EKG Interpretation  Date/Time:  Monday February 06 2021 16:54:26 EDT Ventricular Rate:  106 PR Interval:  138 QRS Duration: 84 QT Interval:  366 QTC Calculation: 486 R Axis:   144 Text Interpretation: Sinus tachycardia Right atrial enlargement Right axis deviation Possible Anterior infarct , age undetermined Abnormal ECG Confirmed by Lorre Munroe ([id number]) on 02/06/2021 8:51:45 PM  Radiology CT HEAD WO CONTRAST  Result Date: 02/06/2021 CLINICAL DATA:  Dizziness, blurred vision.  History of hypertension. EXAM: CT HEAD WITHOUT CONTRAST TECHNIQUE: Contiguous axial images were obtained from the base of the skull through the vertex without intravenous contrast. COMPARISON:  04/05/2020 FINDINGS: Brain: Focal area of hyperdensity within the left pons  measuring approximately 6 mm in diameter concerning for hemorrhage (series 3, images 10-11). Scattered areas of decreased attenuation within the periventricular and subcortical white matter, progressed from prior, including new low-density lesion along the genu of the corpus callosum on the left (series 6, image 16). No extra-axial hemorrhage. No hydrocephalus. No evidence of mass lesion or mass effect. Vascular: Atherosclerotic calcifications involving the large vessels of the skull base. No unexpected hyperdense vessel. Skull: Normal. Negative for fracture or focal lesion. Sinuses/Orbits: No acute finding. Other: None. IMPRESSION: 1. Small focal area of hyperdensity within  the pons most suggestive of hypertensive microhemorrhage. 2. Scattered areas of decreased attenuation within the periventricular and subcortical white matter, progressed from prior, including new low-density lesion along the genu of the corpus callosum on the left. Findings are most suggestive of microvascular ischemic change and probable chronic microhemorrhages. Consider further evaluation with MRI of the brain to assess for acute ischemia. These results were called by telephone at the time of interpretation on 02/06/2021 at 6:51 pm to provider Bennie Dallas, who verbally acknowledged these results. Electronically Signed   By: Davina Poke D.O.   On: 02/06/2021 18:52    Procedures Procedures   CRITICAL CARE Performed by: Larene Pickett   Total critical care time: 45 minutes  Critical care time was exclusive of separately billable procedures and treating other patients.  Critical care was necessary to treat or prevent imminent or life-threatening deterioration.  Critical care was time spent personally by me on the following activities: development of treatment plan with patient and/or surrogate as well as nursing, discussions with consultants, evaluation of patient's response to treatment, examination of patient, obtaining  history from patient or surrogate, ordering and performing treatments and interventions, ordering and review of laboratory studies, ordering and review of radiographic studies, pulse oximetry and re-evaluation of patient's condition.   Medications Ordered in ED Medications  oxyCODONE-acetaminophen (PERCOCET/ROXICET) 5-325 MG per tablet 1 tablet (1 tablet Oral Given 02/06/21 2207)   stroke: mapping our early stages of recovery book (has no administration in time range)  acetaminophen (TYLENOL) tablet 650 mg (has no administration in time range)    Or  acetaminophen (TYLENOL) 160 MG/5ML solution 650 mg (has no administration in time range)    Or  acetaminophen (TYLENOL) suppository 650 mg (has no administration in time range)  senna-docusate (Senokot-S) tablet 1 tablet (has no administration in time range)  pantoprazole (PROTONIX) injection 40 mg (has no administration in time range)  labetalol (NORMODYNE) injection 20 mg (has no administration in time range)    And  clevidipine (CLEVIPREX) infusion 0.5 mg/mL (has no administration in time range)    ED Course  I have reviewed the triage vital signs and the nursing notes.  Pertinent labs & imaging results that were available during my care of the patient were reviewed by me and considered in my medical decision making (see chart for details).    MDM Rules/Calculators/A&P   50 year old female presenting to the ED with dizziness.  Was feeling poorly yesterday, cannot give specific onset.  She went to bed early last evening before 10 PM and woke up a little before 9 AM without any improvement of her symptoms.  Seems to have worsening symptoms today including generalized weakness, dizziness, and gait disturbance.  States she does feel like she is a little more weak on her left side and has a little more difficulty moving her left extremities.  She is awake, alert, properly oriented on exam.  She does have a little more weakness on her left  compared with right, movements are slower on left as well.  She is very hypertensive here-- last pressure 169/111.  She has been off antihypertensives for over 1 month but also has history of cocaine use (reports last use was 6 months ago).  Labs as above.  CT with findings of acute hypertensive bleed.    Discussed with neurology, Dr. Rory Percy-- will admit to ICU, starting cleviprex.  MRI ordered and is pending.    Final Clinical Impression(s) / ED Diagnoses Final diagnoses:  Intracranial  hemorrhage [facility])    Rx / DC Orders ED Discharge Orders     None        Larene Pickett, PA-C 02/07/21 0115    Ripley Fraise, MD 02/07/21 [phone]

## 2021-02-07 NOTE — Progress Notes (Signed)
SLP Cancellation Note  Patient Details Name: ANBERLIN DIEZ MRN: [id number] DOB: September 03, 1970   Cancelled treatment:       Reason Eval/Treat Not Completed: Patient refused to participate, stating she needs to get rest. Will continue efforts.   Ebany Bowermaster B. Quentin Ore, [facility], [place] Speech Language Pathologist Office: [phone] Pager: [phone]  Shonna Chock 02/07/2021, 1:43 PM

## 2021-02-08 ENCOUNTER — Inpatient Hospital Stay (HOSPITAL_COMMUNITY): Payer: Medicaid Other

## 2021-02-08 ENCOUNTER — Inpatient Hospital Stay (HOSPITAL_COMMUNITY): Payer: Self-pay

## 2021-02-08 DIAGNOSIS — I10 Essential (primary) hypertension: Secondary | ICD-10-CM

## 2021-02-08 DIAGNOSIS — I6389 Other cerebral infarction: Secondary | ICD-10-CM

## 2021-02-08 LAB — CBC
HCT: 42.3 % (ref 36.0–46.0)
Hemoglobin: 13.2 g/dL (ref 12.0–15.0)
MCH: 25.5 pg — ABNORMAL LOW (ref 26.0–34.0)
MCHC: 31.2 g/dL (ref 30.0–36.0)
MCV: 81.7 fL (ref 80.0–100.0)
Platelets: 271 10*3/uL (ref 150–400)
RBC: 5.18 MIL/uL — ABNORMAL HIGH (ref 3.87–5.11)
RDW: 14.9 % (ref 11.5–15.5)
WBC: 5.5 10*3/uL (ref 4.0–10.5)
nRBC: 0 % (ref 0.0–0.2)

## 2021-02-08 LAB — BASIC METABOLIC PANEL
Anion gap: 7 (ref 5–15)
BUN: 7 mg/dL (ref 6–20)
CO2: 23 mmol/L (ref 22–32)
Calcium: 9.1 mg/dL (ref 8.9–10.3)
Chloride: 106 mmol/L (ref 98–111)
Creatinine, Ser: 0.78 mg/dL (ref 0.44–1.00)
GFR, Estimated: >60 mL/min (ref >60)
Glucose, Bld: 178 mg/dL — ABNORMAL HIGH (ref 70–99)
Potassium: 3.7 mmol/L (ref 3.5–5.1)
Sodium: 136 mmol/L (ref 135–145)

## 2021-02-08 LAB — ECHOCARDIOGRAM COMPLETE
Area-P 1/2: 2.32 cm2
S' Lateral: 2.3 cm

## 2021-02-08 LAB — HEMOGLOBIN A1C
Hgb A1c MFr Bld: 6.3 % — ABNORMAL HIGH (ref 4.8–5.6)
Mean Plasma Glucose: 134 mg/dL

## 2021-02-08 MED ORDER — LORAZEPAM 2 MG/ML IJ SOLN
0.5000 mg | Freq: Once | INTRAMUSCULAR | Status: AC
  Administered 2021-02-08: 0.5 mg via INTRAVENOUS

## 2021-02-08 MED ORDER — LABETALOL HCL 5 MG/ML IV SOLN
20.0000 mg | INTRAVENOUS | Status: DC | PRN
  Administered 2021-02-08: 20 mg via INTRAVENOUS

## 2021-02-08 MED ORDER — LORAZEPAM 2 MG/ML IJ SOLN
INTRAMUSCULAR | Status: AC
  Filled 2021-02-08: qty 1

## 2021-02-08 MED ORDER — AMLODIPINE BESYLATE 10 MG PO TABS
10.0000 mg | ORAL_TABLET | Freq: Every day | ORAL | Status: DC
  Administered 2021-02-08 – 2021-02-09 (×2): 10 mg via ORAL
  Filled 2021-02-08: qty 2
  Filled 2021-02-08: qty 1

## 2021-02-08 MED ORDER — LISINOPRIL 20 MG PO TABS: 20.0000 mg | ORAL_TABLET | Freq: Every day | ORAL | Status: DC

## 2021-02-08 MED ORDER — HYDRALAZINE HCL 20 MG/ML IJ SOLN: 20.0000 mg | INTRAMUSCULAR | Status: DC | PRN

## 2021-02-08 NOTE — Progress Notes (Signed)
Patient demanded I remove her IV. That if I didn't she would do it her Damn self. So I obliged and removed her IV from her left hand. I also informed the patient she needed an IV to receive needed medication. The patient replied with "You are not going to Fucking stick me". Patient started yelling and moaning and holding her left wrist. I applied a warm pack to the left wrist and covered the patient with warm blankets. I will continue to monitor the patient.

## 2021-02-08 NOTE — Progress Notes (Signed)
Pt refuses to wear telemetry box. Dr. Rory Percy is aware. Will continue to monitor condition, to the extent the patient will allow.

## 2021-02-08 NOTE — TOC Initial Note (Signed)
Transition of Care [facility]) - Initial/Assessment Note    Patient Details  Name: Roberta Calderon MRN: [id number] Date of Birth: 1970-10-02  Transition of Care [facility]) CM/SW Contact:    Benard Halsted, LCSW Phone Number: 02/08/2021, 12:24 PM  Clinical Narrative:                 TOC received referral for OP physical therapy. CSW spoke with patient. She is agreeable to OP Rehab referral. She stated she does not want to discharge home today, but tomorrow. She stated her car is here for when she does discharge. No other concerns noted. Refused substance use resources.   Expected Discharge Plan: OP Rehab Barriers to Discharge: No Barriers Identified   Patient Goals and CMS Choice     Choice offered to / list presented to : Patient  Expected Discharge Plan and Services Expected Discharge Plan: OP Rehab In-house Referral: Clinical Social Work Discharge Planning Services: CM Consult   Living arrangements for the past 2 months: [place]                                      Prior Living Arrangements/Services Living arrangements for the past 2 months: Single Family Home Lives with:: Self Patient language and need for interpreter reviewed:: Yes Do you feel safe going back to the place where you live?: Yes      Need for Family Participation in Patient Care: No (Comment) Care giver support system in place?: Yes (comment)   Criminal Activity/Legal Involvement Pertinent to Current Situation/Hospitalization: No - Comment as needed  Activities of Daily Living      Permission Sought/Granted Permission sought to share information with : Facility Art therapist granted to share information with : Yes, Verbal Permission Granted     Permission granted to share info w AGENCY: OP Rehab        Emotional Assessment Appearance:: Appears stated age Attitude/Demeanor/Rapport: Guarded Affect (typically observed): Guarded Orientation: : Oriented to Self,  Oriented to Place, Oriented to  Time, Oriented to Situation Alcohol / Substance Use: Illicit Drugs Psych Involvement: No (comment)  Admission diagnosis:  Intracranial hemorrhage ([place]) [I62.9] ICH (intracerebral hemorrhage) ([place]) [I61.9] Stroke (cerebrum) ([place]) [I63.9] Patient Active Problem List   Diagnosis Date Noted   ICH (intracerebral hemorrhage) ([place]) 02/07/2021   Dyslipidemia    Right thalamic infarction ([place]) 04/08/2020   Prediabetes    Polysubstance abuse ([place])    Hemiparesis affecting left side as late effect of stroke ([place])    Ischemic stroke ([place]) 04/06/2020   Hypertension 04/06/2020   Tobacco use disorder 04/06/2020   PCP:  Patient, No Pcp Per (Inactive) Pharmacy:   [place] ([place] - [address] [place] [address] Phone: [phone] Fax: [phone]     Social Determinants of Health (SDOH) Interventions    Readmission Risk Interventions No flowsheet data found.

## 2021-02-08 NOTE — Progress Notes (Signed)
Patient medicated and would not tolerate exam.  Patient started kicking and screaming, pulling at MRI imaging coil.

## 2021-02-08 NOTE — Progress Notes (Addendum)
Patient refused morning lab work 2 different times with 2 different lab personnel.   Neuro aware

## 2021-02-08 NOTE — TOC CAGE-AID Note (Signed)
Transition of Care [facility]) - CAGE-AID Screening   Patient Details  Name: Roberta Calderon MRN: [id number] Date of Birth: February 14, 1971  Transition of Care [facility]) CM/SW Contact:    Benard Halsted, LCSW Phone Number: 02/08/2021, 12:21 PM   Clinical Narrative: CSW spoke with patient. Patient denied substance use and refused resources, turning over in the bed and no longer engaging with CSW.    CAGE-AID Screening:    Have You Ever Felt You Ought to Cut Down on Your Drinking or Drug Use?: No Have People Annoyed You By Critizing Your Drinking Or Drug Use?: No Have You Felt Bad Or Guilty About Your Drinking Or Drug Use?: No Have You Ever Had a Drink or Used Drugs First Thing In The Morning to Steady Your Nerves or to Get Rid of a Hangover?: No CAGE-AID Score: 0  Substance Abuse Education Offered: Yes  Substance abuse interventions: Other (must comment) (Pt denied use)

## 2021-02-08 NOTE — Progress Notes (Addendum)
STROKE TEAM PROGRESS NOTE    Interval History   No acute events overnight.   Clevidipine gtt discontinued today, Amlodipine 10 mg QD added to her regimen.    Patient counseled to stop using cocaine given her UDS was positive for cocaine.   Of note, she has been refusing to have labs drawn and to work with therapy. This was also discussed with her. She is agreeable to having her MRI Brain done today.   Pertinent Lab Work and Imaging    02/06/21 CT Head WO IV Contrast 1. Small focal area of hyperdensity within the pons most suggestive of hypertensive microhemorrhage. 2. Scattered areas of decreased attenuation within the periventricular and subcortical white matter, progressed from prior, including new low-density lesion along the genu of the corpus callosum on the left. Findings are most suggestive of microvascular ischemic change and probable chronic microhemorrhages. Consider further evaluation with MRI of the brain to assess for acute ischemia.  02/07/21 CTA Head and Neck  CTA head:   Severely limited evaluation due to venous timing and motion. The proximal large vessels appear patent intracranially with non diagnostic evaluation distally and nondiagnostic evaluation for stenosis.   CTA neck:  Severely limited evaluation due to venous timing. Faint apparent opacification of the carotid arteries suggests patency. There is bilateral carotid bifurcation atherosclerosis with the degree of stenosis difficult to quantify on this study. Essentially nondiagnostic evaluation of the vertebral arteries in the neck with visible opacification in the upper neck  MRI Brain WO IV Contrast Pending, going today   Echocardiogram Complete   1. Left ventricular ejection fraction, by estimation, is 60 to 65%. The  left ventricle has normal function. The left ventricle has no regional  wall motion abnormalities. Left ventricular diastolic parameters are  consistent with Grade I diastolic  dysfunction  (impaired relaxation).   2. Right ventricular systolic function is normal. The right ventricular  size is normal. Tricuspid regurgitation signal is inadequate for assessing  PA pressure.   3. The mitral valve is normal in structure. No evidence of mitral valve  regurgitation.   4. The aortic valve is tricuspid. There is mild thickening of the aortic  valve. Aortic valve regurgitation is not visualized. No aortic stenosis is  present.   5. The inferior vena cava is normal in size with greater than 50%  respiratory variability, suggesting right atrial pressure of 3 mmHg.   Physical Examination   Constitutional: Calm, appropriate for condition  Cardiovascular: Normal RR Respiratory: No increased WOB   Mental status: AAOx4, following commands  Speech: Fluent, repetition and naming intact  Cranial nerves: EOMI, VFF, Face symmetric, Tongue midline  Motor: Normal bulk and tone. No drift. RUE 5/5 LUE 4/5 BLE 5/5  Sensory: Decreased sensation to light tough to left arm and left leg  Coordination: FNF intact  Gait: Deferred   NIHSS 1 for sensation   Assessment and Plan   Roberta Calderon is a 50 y.o. 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   NEURO #Left Pontine Bleed versus Mineralization  Patient presented with the symptoms described above. Initial CTH showed a focal area of hyper density within the left pons measuring approximately 6 mm in diameter concerning for hemorrhage. Are evaluating for vascular malformations that could have contributed to bleed with MRI Brain + CTA Head and Neck. CTA Head and Neck was limited due to venous timing and  motion; negative for AVM/aneurysm. Of note the [facility] obtained with the CTA Head and Neck questioned whether the area to the left pons was hemorrhage versus mineralization. MRI Brain results are pending for further eval  of questionable bleed vs mineralization. UDS was positive for cocaine. If this is a bleed could be in the setting of elevated BP/cocaine use. - No antiplatelets  - SBP goal < 160  - Follow up MRI Brain  -At discharge will place ambulatory referral to neurology for stroke follow up  - PT/OT recs: PT recommending outpatient PT OT no needs   CARDS #Hypertension She has a history of HTN and was prescribed Lisinopril 5 mg + Amlodipine 10 mg at home. Currently blood pressures are trending normotensive. Given bleed blood pressure goal is < 160.  - Clevidipine gtt discontinued - Lisinopril increased from 5 to 20 on 6/15  - Amlodipine 10 mg initiated 6/15  #Hyperlipidemia From a stroke prevention stand point, the LDL goal is < 70. LDL is 142, Atorvastatin 80 mg initiated   RESP Oxygenating well on RA. No acute respiratory issues at this time  RENAL Cr and lytes WNL, following renal function on BMP   GI  No dysphagia noted, regular diet in place. Bowel regimen with Senokot   ENDO #Stroke Diabetes Screening  #Prediabetes  Hemoglobin A1C 6.3. Goal < 7 from a stroke reduction stand point she is at goal. She will need outpatient prediabetes management by her PCP   HEME Hemoglobin hematocrit and platelet count stable   ID Afebrile. No infectious processes at this time.   Hospital day # 1  Roberta Hinds, NP  [facility] Nurse Practitioner Patient seen and discussed with attending physician Dr. Erlinda Hong   ATTENDING NOTE: I reviewed above note and agree with the assessment and plan. Pt was seen and examined.   Per RN, pt refused nursing care, PT/OT, lab draw and MRI, even difficult to get CTA done yesterday. However, when I talked with pt, she agreed with MRI, PT/OT and BP meds, etc. Still complains of left UE LE weakness and decreased light touch sensation. Also complains of left UE pain at wrist, however, left ankle pain resolved. UDS showed cocaine and she admitted that she used  cocaine several days ago. However, on exam, she had giveaway weakness on the left and subjective left sided sensation decreased at face, UE and LE. Pending MRI to differentiate left pontine ICH vs. Mineralization. BP stable but at higher end, will increase dose of her BP meds, off cleviprex. Will transfer out of ICU.  Rosalin Hawking, MD PhD Stroke Neurology 02/08/2021 7:17 AM  This patient is critically ill due to possible pontine ICH, hypertensive emergency and at significant risk of neurological worsening, death form hematoma expansion, recurrent stroke, hypertensive encephalopathy, seizure. This patient's care requires constant monitoring of vital signs, hemodynamics, respiratory and cardiac monitoring, review of multiple databases, neurological assessment, discussion with family, other specialists and medical decision making of high complexity. I spent 40 minutes of neurocritical care time in the care of this patient.     To contact Stroke Continuity provider, please refer to [email or web address] After hours, contact General Neurology

## 2021-02-08 NOTE — TOC Progression Note (Signed)
Transition of Care [facility]) - Progression Note    Patient Details  Name: Roberta Calderon MRN: [id number] Date of Birth: 09/25/1970  [facility] CM/SW Contact  Ella Bodo, RN Phone Number: 02/08/2021, 2:03 PM  Clinical Narrative:   PT recommending outpatient physical therapy, and patient is agreeable to referral.  Referral made to [facility] for follow up; rehab center to call patient for appointment.    Expected Discharge Plan: OP Rehab Barriers to Discharge: No Barriers Identified  Expected Discharge Plan and Services Expected Discharge Plan: OP Rehab In-house Referral: Clinical Social Work Discharge Planning Services: CM Consult   Living arrangements for the past 2 months: Single Family Home                                       Social Determinants of Health (SDOH) Interventions    Readmission Risk Interventions No flowsheet data found.  Reinaldo Raddle, RN, BSN  Trauma/Neuro ICU Case Manager [phone]

## 2021-02-08 NOTE — Progress Notes (Signed)
Physical Therapy Treatment Patient Details Name: Roberta Calderon MRN: [id number] DOB: 1971-03-31 Today's Date: 02/08/2021    History of Present Illness Pt is a 50 y.o. 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 Significant PMH: HTN, ischemic strokes.    PT Comments    Pt willing to participate in physical therapy session today. Ambulating a total of ~100 ft with no assistive device at a min guard assist level. Continues with left sided weakness and sensation impairments. Would benefit from follow up OPPT to address.     Follow Up Recommendations  Outpatient PT     Equipment Recommendations  None recommended by PT    Recommendations for Other Services       Precautions / Restrictions Precautions Precautions: Fall Restrictions Weight Bearing Restrictions: No    Mobility  Bed Mobility Overal bed mobility: Modified Independent                  Transfers Overall transfer level: Independent Equipment used: None                Ambulation/Gait Ambulation/Gait assistance: Min guard Gait Distance (Feet): 100 Feet (50 ft x 2) Assistive device: None Gait Pattern/deviations: Step-through pattern;Decreased stance time - left;Decreased weight shift to left     General Gait Details: Pt ambulating 50 ft x 2 with a seated rest break in between; reporting LUE heaviness. Pt with decreased LLE stance time, requiring min guard assist. No overt LOB   Stairs             Wheelchair Mobility    Modified Rankin (Stroke Patients Only) Modified Rankin (Stroke Patients Only) Pre-Morbid Rankin Score: No symptoms Modified Rankin: Moderately severe disability     Balance Overall balance assessment: Needs assistance Sitting-balance support: No upper extremity supported;Feet supported Sitting balance-Leahy Scale: Good     Standing balance support: No upper extremity supported Standing balance-Leahy Scale:  Good                              Cognition Arousal/Alertness: Awake/alert Behavior During Therapy: Flat affect Overall Cognitive Status: Difficult to assess                                 General Comments: oriented x3      Exercises      General Comments General comments (skin integrity, edema, etc.): VSS      Pertinent Vitals/Pain Pain Assessment: Faces Faces Pain Scale: Hurts little more Pain Location: L hand Pain Descriptors / Indicators: Heaviness Pain Intervention(s): Monitored during session    Home Living Family/patient expects to be discharged to:: Private residence Living Arrangements: Parent Available Help at Discharge: Family;Available PRN/intermittently (mother works) Type of Home: House Home Access: Stairs to enter   Home Layout: One level Home Equipment: None      Prior Function Level of Independence: Independent          PT Goals (current goals can now be found in the care plan section) Acute Rehab PT Goals Patient Stated Goal: to watch TV Potential to Achieve Goals: Good Progress towards PT goals: Progressing toward goals    Frequency    Min 4X/week      PT Plan Current plan remains appropriate    Co-evaluation  AM-PAC PT "6 Clicks" Mobility   Outcome Measure  Help needed turning from your back to your side while in a flat bed without using bedrails?: None Help needed moving from lying on your back to sitting on the side of a flat bed without using bedrails?: None Help needed moving to and from a bed to a chair (including a wheelchair)?: None Help needed standing up from a chair using your arms (e.g., wheelchair or bedside chair)?: None Help needed to walk in hospital room?: A Little Help needed climbing 3-5 steps with a railing? : A Little 6 Click Score: 22    End of Session   Activity Tolerance: Patient tolerated treatment well Patient left: in bed;with call bell/phone within  reach;with bed alarm set Nurse Communication: Mobility status PT Visit Diagnosis: Unsteadiness on feet (R26.81);Hemiplegia and hemiparesis     Time: 3736-6815 PT Time Calculation (min) (ACUTE ONLY): 17 min  Charges:  $Therapeutic Activity: 8-22 mins                     Wyona Almas, PT, DPT Acute Rehabilitation Services Pager [phone] Office [phone]    Deno Etienne 02/08/2021, 3:11 PM

## 2021-02-08 NOTE — Evaluation (Signed)
Occupational Therapy Evaluation Patient Details Name: Roberta Calderon MRN: [id number] DOB: Nov 11, 1970 Today's Date: 02/08/2021    History of Present Illness Pt is a 50 y.o. 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 Significant PMH: HTN, ischemic strokes.   Clinical Impression   Patient evaluated by Occupational Therapy with no further acute OT needs identified. All education has been completed and the patient has no further questions. See below for any follow-up Occupational Therapy or equipment needs. OT to sign off. Thank you for referral.      Follow Up Recommendations  No OT follow up    Equipment Recommendations  None recommended by OT    Recommendations for Other Services       Precautions / Restrictions Precautions Precautions: Fall      Mobility Bed Mobility Overal bed mobility: Modified Independent                  Transfers Overall transfer level: Modified independent                    Balance Overall balance assessment: Needs assistance Sitting-balance support: No upper extremity supported;Feet supported Sitting balance-Leahy Scale: Good     Standing balance support: No upper extremity supported;During functional activity Standing balance-Leahy Scale: Good                             ADL either performed or assessed with clinical judgement   ADL Overall ADL's : Modified independent                                       General ADL Comments: pt demonstrates adjusting socks, pt using TV remote, pt able to follow eye assessment with verbalized deficits. pt insisting on watching TV in L visual field at end of session from [place] Patient Visual Report: Blurring of vision Vision Assessment?: Yes Eye Alignment: Within Functional Limits Ocular Range of Motion: Within Functional Limits Alignment/Gaze Preference: Within Defined  Limits Tracking/Visual Pursuits: Able to track stimulus in all quads without difficulty Additional Comments: pt reports inability to see from L eye. when OT occludes R eye pt states I can't see you have it covered meaning L eye. Pt educated i am assessing each eye and pt continued session. pt able to track in all quadrants. when allowed to use both eyes pt reports object disappears in R upper quadrant     Perception     Praxis      Pertinent Vitals/Pain Pain Assessment: Faces Faces Pain Scale: Hurts whole lot Pain Location: L hand Pain Descriptors / Indicators: Cramping Pain Intervention(s): Monitored during session;Repositioned;Other (comment) (no complaints with functional task)     Hand Dominance Right   Extremity/Trunk Assessment Upper Extremity Assessment Upper Extremity Assessment: LUE deficits/detail;RUE deficits/detail RUE Deficits / Details: WFL LUE Deficits / Details: pt demonstrates inability to demonstrate mcp flexion, DIP and PIP full wrist extension. Any attempts to PROM met with resistance. pt demontrates full AROM with holding remote to tv and Ice cream offered that pt wanted. LUE Coordination: decreased fine motor;decreased gross motor   Lower Extremity Assessment Lower Extremity Assessment: Defer to PT evaluation   Cervical / Trunk Assessment Cervical / Trunk Assessment: Normal   Communication Communication Communication: No difficulties   Cognition Arousal/Alertness: Awake/alert  Behavior During Therapy: Flat affect Overall Cognitive Status: Difficult to assess                                 General Comments: oriented x3   General Comments  VSS    Exercises     Shoulder Instructions      Home Living Family/patient expects to be discharged to:: Private residence Living Arrangements: Parent Available Help at Discharge: Family;Available PRN/intermittently (mother works) Type of Home: House Home Access: Stairs to enter Engineer, site of Steps: 3   Home Layout: One level     Bathroom Shower/Tub: Teacher, early years/pre: [place]: None          Prior Functioning/Environment Level of Independence: Independent                 OT Problem List:        OT Treatment/Interventions:      OT Goals(Current goals can be found in the care plan section) Acute Rehab OT Goals Patient Stated Goal: to watch TV  OT Frequency:     Barriers to D/C:            Co-evaluation              AM-PAC OT "6 Clicks" Daily Activity     Outcome Measure Help from another person eating meals?: None Help from another person taking care of personal grooming?: None Help from another person toileting, which includes using toliet, bedpan, or urinal?: None Help from another person bathing (including washing, rinsing, drying)?: None Help from another person to put on and taking off regular upper body clothing?: None Help from another person to put on and taking off regular lower body clothing?: None 6 Click Score: 24   End of Session Nurse Communication: Mobility status;Precautions  Activity Tolerance: Patient tolerated treatment well Patient left: in chair;with call bell/phone within reach;with chair alarm set  OT Visit Diagnosis: Unsteadiness on feet (R26.81)                Time: 8882-8003 OT Time Calculation (min): 27 min Charges:  OT General Charges $OT Visit: 1 Visit OT Evaluation $OT Eval Moderate Complexity: 1 Mod   Roberta Calderon, OTR/L  Acute Rehabilitation Services Pager: [phone] Office: [phone] .   Jeri Modena 02/08/2021, 2:18 PM

## 2021-02-08 NOTE — Progress Notes (Signed)
  Echocardiogram 2D Echocardiogram has been performed.  Roberta Calderon 02/08/2021, 12:15 PM

## 2021-02-09 ENCOUNTER — Inpatient Hospital Stay (HOSPITAL_COMMUNITY): Payer: Self-pay

## 2021-02-09 ENCOUNTER — Other Ambulatory Visit (HOSPITAL_COMMUNITY): Payer: Self-pay

## 2021-02-09 DIAGNOSIS — I629 Nontraumatic intracranial hemorrhage, unspecified: Secondary | ICD-10-CM

## 2021-02-09 DIAGNOSIS — F1410 Cocaine abuse, uncomplicated: Secondary | ICD-10-CM

## 2021-02-09 LAB — BASIC METABOLIC PANEL
Anion gap: 7 (ref 5–15)
BUN: 6 mg/dL (ref 6–20)
CO2: 25 mmol/L (ref 22–32)
Calcium: 9.5 mg/dL (ref 8.9–10.3)
Chloride: 106 mmol/L (ref 98–111)
Creatinine, Ser: 0.79 mg/dL (ref 0.44–1.00)
GFR, Estimated: >60 mL/min (ref >60)
Glucose, Bld: 133 mg/dL — ABNORMAL HIGH (ref 70–99)
Potassium: 4.1 mmol/L (ref 3.5–5.1)
Sodium: 138 mmol/L (ref 135–145)

## 2021-02-09 MED ORDER — AMLODIPINE BESYLATE 10 MG PO TABS
10.0000 mg | ORAL_TABLET | Freq: Every day | ORAL | 11 refills | Status: DC
  Filled 2021-02-09: qty 30, 30d supply, fill #0

## 2021-02-09 MED ORDER — LORAZEPAM 2 MG/ML IJ SOLN
1.0000 mg | INTRAMUSCULAR | Status: AC
  Administered 2021-02-09: 1 mg via INTRAVENOUS
  Filled 2021-02-09: qty 1

## 2021-02-09 MED ORDER — LISINOPRIL 20 MG PO TABS
40.0000 mg | ORAL_TABLET | Freq: Every day | ORAL | Status: DC
  Administered 2021-02-09: 40 mg via ORAL
  Filled 2021-02-09: qty 2

## 2021-02-09 MED ORDER — ATORVASTATIN CALCIUM 80 MG PO TABS
80.0000 mg | ORAL_TABLET | Freq: Every day | ORAL | 11 refills | Status: DC
  Filled 2021-02-09: qty 30, 30d supply, fill #0

## 2021-02-09 MED ORDER — LISINOPRIL 40 MG PO TABS
40.0000 mg | ORAL_TABLET | Freq: Every day | ORAL | 2 refills | Status: DC
  Filled 2021-02-09: qty 30, 30d supply, fill #0

## 2021-02-09 NOTE — TOC Transition Note (Signed)
Transition of Care [facility]) - CM/SW Discharge Note   Patient Details  Name: Roberta Calderon MRN: [id number] Date of Birth: 09/21/1970  Transition of Care [facility]) CM/SW Contact:  Pollie Friar, RN Phone Number: 02/09/2021, 3:40 PM   Clinical Narrative:    Patient discharging home with outpatient therapy follow up. Pt has only family planning medicaid and no PCP. CM met with her and she was interested in using one of the [facility]. CM was able to get her an appointment at [facility] with information on the AVS. Pt also asking for assistance with increasing her medicaid. CM sent email to financial counseling and they met with her at the bedside.  CM also sent out for housing resources and food resources through [employer]. The resources will contact the patients mother as this is the number the patient provided.  Medications for home delivered to the room per [place]. CM covered the cost per petty cash.  Pt' s mother aware of d/c and arranged for pts daughter to pick her up around 5-5:30 pm. Pt did not want to wait and left the unit.   Final next level of care: OP Rehab Barriers to Discharge: Inadequate or no insurance, Barriers Unresolved (comment)   Patient Goals and CMS Choice     Choice offered to / list presented to : Patient  Discharge Placement                       Discharge Plan and Services In-house Referral: Clinical Social Work Discharge Planning Services: CM Consult                                 Social Determinants of Health (SDOH) Interventions     Readmission Risk Interventions No flowsheet data found.

## 2021-02-09 NOTE — Discharge Summary (Addendum)
Stroke Discharge Summary  Patient ID: Roberta Calderon       MRN: [id number]      DOB: 02-28-1971  Date of Admission: 02/06/2021 Date of Discharge: 02/09/2021  Attending Physician: Dr. Erlinda Hong  Consultant(s):  None  Patient's PCP:  Patient, No Pcp Per (Inactive)  Discharge Diagnosis Active Problems:   ICH (intracerebral hemorrhage) ([place])   Allergies as of 02/09/2021       Reactions   Nsaids Nausea And Vomiting, Rash   Aspirin Hives        Medication List     STOP taking these medications    acetaminophen 325 MG tablet Commonly known as: TYLENOL   nicotine 7 mg/24hr patch Commonly known as: NICODERM CQ - dosed in mg/24 hr       TAKE these medications    amLODipine 10 MG tablet Commonly known as: NORVASC Take 1 tablet (10 mg total) by mouth daily.   atorvastatin 80 MG tablet Commonly known as: Lipitor Take 1 tablet (80 mg total) by mouth daily.   lisinopril 40 MG tablet Commonly known as: ZESTRIL Take 1 tablet (40 mg total) by mouth daily. Start taking on: February 10, 2021 What changed:  medication strength how much to take      History of Present Illness   [name] is a 50 y.o. female past medical history of hypertension, ischemic strokes, noncompliant to medications, presenting with 2 to 3-day history of multitude of symptoms: Weakness, difficulty walking, blurred vision and feeling that her body is shutting down.  She reports that she is allergic to aspirin and was recommended to take aspirin after her strokes but has been noncompliant.  She also does not take any blood pressure medications but does not see a doctor.  2 or 3 days ago, she started feeling somewhat sick-no headaches but felt that the left arm and leg are not acting as strongly as they usually do. After her last stroke, which had some left-sided weakness, she had made a near complete recovery with minimal to no residual left-sided weakness.  She started feeling difficulty walking-feeling  off balance and swerving to the left/ She came to the emergency room, was sent for a CT head which was concerning for a pontine area of a small bleeding and some new areas of infarction compared to her prior CT.  She was finally boarded in the room and neurological consultation was obtained. She came in hypertensive with systolic blood pressures highest in the 190s. She denies headaches at this time. Reports shortness of breath ongoing for the past week or so. Reports cocaine abuse in the past-last use 6 months ago.  Pertinent Imaging  02/06/21 CT Head WO IV Contrast 1. Small focal area of hyperdensity within the pons most suggestive of hypertensive microhemorrhage. 2. Scattered areas of decreased attenuation within the periventricular and subcortical white matter, progressed from prior, including new low-density lesion along the genu of the corpus callosum on the left. Findings are most suggestive of microvascular ischemic change and probable chronic microhemorrhages. Consider further evaluation with MRI of the brain to assess for acute ischemia.   02/07/21 CTA Head and Neck  CTA head:   Severely limited evaluation due to venous timing and motion. The proximal large vessels appear patent intracranially with non diagnostic evaluation distally and nondiagnostic evaluation for stenosis.   CTA neck:  Severely limited evaluation due to venous timing. Faint apparent opacification of the carotid arteries suggests patency. There is bilateral carotid bifurcation atherosclerosis with the  degree of stenosis difficult to quantify on this study. Essentially nondiagnostic evaluation of the vertebral arteries in the neck with visible opacification in the upper neck   MRI Brain WO IV Contrast Pending, going today    02/09/21 Echocardiogram Complete   1. Left ventricular ejection fraction, by estimation, is 60 to 65%. The  left ventricle has normal function. The left ventricle has no regional  wall motion  abnormalities. Left ventricular diastolic parameters are  consistent with Grade I diastolic  dysfunction (impaired relaxation).   2. Right ventricular systolic function is normal. The right ventricular  size is normal. Tricuspid regurgitation signal is inadequate for assessing  PA pressure.   3. The mitral valve is normal in structure. No evidence of mitral valve  regurgitation.   4. The aortic valve is tricuspid. There is mild thickening of the aortic  valve. Aortic valve regurgitation is not visualized. No aortic stenosis is  present.   5. The inferior vena cava is normal in size with greater than 50%  respiratory variability, suggesting right atrial pressure of 3 mmHg.    Discharge Examination   Constitutional: Calm, appropriate for condition Cardiovascular: Normal RR Respiratory: No increased WOB   Mental status: AAOx4, following commands Speech: Fluent, repetition and naming intact Cranial nerves: EOMI, VFF, Face symmetric, Tongue midline  Motor: Normal bulk and tone. No drift. RUE 5/5 LUE 4/5 BLE 5/5. She has give way weakness on exam when assessing left deltoid/bicep/tricep  Sensory: Decreased sensation to light tough to left arm and left leg Coordination: FNF intact  Gait: Deferred NIHSS: 1 for sensation   Assessment and Plan  [name] is a 50 y.o. female w/pmh of hypertension, ischemic strokes, medication non compliance who presents with weakness, difficulty walking, blurred vision found to have a left pontine bleed versus mineralization.    #Left Pontine Bleed versus Mineralization  Patient presented with the symptoms described above. Initial CTH showed a focal area of hyper density within the left pons measuring approximately 6 mm in diameter concerning for hemorrhage. To evaluate for vascular malformations that could have caused her bleed MRI Brain + CTA Head and Neck were ordered. CTA Head and Neck was limited due to venous timing and motion; negative for  AVM/aneurysm. Of note the [facility] obtained with the CTA Head and Neck questioned whether the area to the left pons was hemorrhage versus mineralization.   To further clarify this questionable bleed vs mineralziation MRI Brain was attempted twice ( Ativan given both times) however patient was not able to tolerate being in the scanner. Thus MRI Brain was never obtained this admission.   Of note UDS was + for cocaine and her SBP was elevated in the 190s upon admission. If this is a bleed it could be in the setting of elevated BP/cocaine use.  At discharge she was recommended to stop antiplatelets, to take her blood pressure medications ( previously non compliant with taking them) and to follow up with the PCP that the case management team signed her up for. Outpatient PT was ordered for her as recommended by PT at dc, OT had no recommendations.   #Hypertension She has a history of HTN and was prescribed Lisinopril 5 mg + Amlodipine 10 mg at home. She was not taking these medications. Currently, blood pressures are trending in the 140s with occasional fluctuations to 170. Blood pressure goal at discharge is normotensive < 130/80. Her Lisinopril was increased from 5 mg to 40 mg and Amlodipine 10 mg  continued this admission. She will need to follow up with her PCP for hypertension management. She was instructed to do this.    #Hyperlipidemia From a stroke prevention stand point, the LDL goal is < 70. LDL is 142, Atorvastatin 80 mg initiated. This was prescribed to her at discharge.   #Stroke Dysphagia Screening Passed and recommended to have a regular diet this admission    #Stroke Diabetes Screening  #Prediabetes  Hemoglobin A1C 6.3. Goal < 7 from a stroke reduction stand point she is at goal. She will need outpatient prediabetes management by her PCP.    #Dispo She was seen by PT and OT this admission. PT recommended outpatient PT, OT had no recommendations. She does not have insurance thus case  management signed her up for a PCP and also vouchered her medications that were prescribed to her this admission.   [name], NP  Stroke Service Nurse Practitioner   Patient seen and discussed with attending physician Dr. Erlinda Hong   35 minutes were spent preparing this discharge   ATTENDING NOTE: I reviewed above note and agree with the assessment and plan. Pt was seen and examined.   Patient neurologically stable, still has giveaway weakness on the left and subjective decreased light touch sensation on the left on exam, however patient able to walk independently without difficulty.  Attempted MRI again after Ativan, however patient still not able to tolerate, MRI aborted.  Given minimal neurological deficit, as well as PT/OT recommend outpatient PT, patient is ready for discharge, recommend follow-up with neurology as outpatient to consider reattempt MRI.  Continue BP control and monitoring at home.  She will be discharged with Lipitor, amlodipine and lisinopril.  Cocaine and smoking cessation education provided.  Follow-up with stroke clinic in [place] in 4 weeks.  For detailed assessment and plan, please refer to above as I have made changes wherever appropriate.   Rosalin Hawking, MD PhD Stroke Neurology 02/09/2021 6:33 PM

## 2021-02-09 NOTE — Progress Notes (Signed)
Explained to pt about exam and how long to expect. Pt somnolent but as soon as scanner began making noise pt started kicking and moving. I rushed in to check on pt and she asked how much longer. I explained that we having even started. I told her if we had to keep stopping exam it would take a lot longer and may have to send her back to her room. She said to send her back to her room and I complied as it is difficult to scan an uncooperative pt.

## 2021-02-09 NOTE — Evaluation (Signed)
Speech Language Pathology Evaluation Patient Details Name: Roberta Calderon MRN: [id number] DOB: 11-May-1971 Today's Date: 02/09/2021 Time: 3614-4315 SLP Time Calculation (min) (ACUTE ONLY): 15 min  Problem List:  Patient Active Problem List   Diagnosis Date Noted   ICH (intracerebral hemorrhage) ([place]) 02/07/2021   Dyslipidemia    Right thalamic infarction ([place]) 04/08/2020   Prediabetes    Polysubstance abuse ([place])    Hemiparesis affecting left side as late effect of stroke ([place])    Ischemic stroke ([place]) 04/06/2020   Hypertension 04/06/2020   Tobacco use disorder 04/06/2020   Past Medical History:  Past Medical History:  Diagnosis Date   Hypertension    Past Surgical History: No past surgical history on file. HPI:  Pt is a 50 y.o. 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 Significant PMH: HTN, ischemic strokes   Assessment / Plan / Recommendation Clinical Impression   Pt presents with deficits in cognition (self affirming changes) and reports some challenges with verbal expression. She notes when talking to her mom this morning, she could think of the word, but not communicate it verbally, noting her mother noticed changes as well. Most recent SLP encounter on prior admission during IPR noted cognitive linguistics [facility] 03/2020. Pt not able to attend to formal cognitive assessment (closing eyes during portions of the SLUMS). She was cooperative this visit with SLP. Pt exhibits deficits in recall, temporal orientation (stating year 2001, day Wednesday). She was oriented x3, did not specifics however including room number. Pt with reduced executive function skills, not noticing breakfast meal untouched at bedside. Assisted with ordering lunch meal from menu, suspect pt will continue to need assistance for proper sequecing to complete tasks. Motor speech skills and auditory comprehension appeared intact. Pt was able to fluently  communicate with SLP though verbal output was short, pt appearing tired and reported headache. Will continue to follow for cognitive linguistic recovery.     SLP Assessment  SLP Recommendation/Assessment: Patient needs continued Speech Lanaguage Pathology Services SLP Visit Diagnosis: Cognitive communication deficit (R41.841);Aphasia (R47.01)    Follow Up Recommendations  24 hour supervision/assistance    Frequency and Duration min 1 x/week  1 week      SLP Evaluation Cognition  Overall Cognitive Status: Difficult to assess Arousal/Alertness: Awake/alert Orientation Level: Oriented to person;Disoriented to time;Oriented to place;Oriented to situation Attention: Focused;Sustained Focused Attention: Impaired Focused Attention Impairment: Verbal basic;Functional basic Sustained Attention: Impaired Sustained Attention Impairment: Verbal basic;Verbal complex;Functional basic;Functional complex Memory: Impaired Memory Impairment: Decreased recall of new information;Decreased short term memory Awareness: Impaired Awareness Impairment: Emergent impairment Problem Solving: Impaired Problem Solving Impairment: Verbal complex;Functional complex Executive Function: Organizing;Self Monitoring;Reasoning Reasoning: Impaired Organizing: Impaired Self Monitoring: Impaired Safety/Judgment: Impaired       Comprehension  Auditory Comprehension Overall Auditory Comprehension: Appears within functional limits for tasks assessed    Expression Expression Primary Mode of Expression: Verbal Verbal Expression Overall Verbal Expression: Impaired Initiation: No impairment Repetition: No impairment Naming: Impairment Other Naming Comments: reports some aphasic troubles when speaking with mom on the phone this morning Pragmatics:  (flat affect) Written Expression Dominant Hand: Right   Oral / Motor  Oral Motor/Sensory Function Overall Oral Motor/Sensory Function: Within functional limits Motor  Speech Overall Motor Speech: Appears within functional limits for tasks assessed   GO                    Hayden Rasmussen MA, CCC-SLP Acute Rehabilitation Services  02/09/2021, 12:30 PM

## 2021-02-09 NOTE — Progress Notes (Signed)
Physical Therapy Treatment Patient Details Name: Roberta Calderon MRN: [id number] DOB: 1971-04-06 Today's Date: 02/09/2021    History of Present Illness Pt is a 50 y.o. 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 Significant PMH: HTN, ischemic strokes.    PT Comments    Patient progressing towards physical therapy goals. Patient with improved gait stability and comfort with introduction of SPC. Patient functioning at supervision level for OOB mobility. Continue to recommend OPPT following discharge to address strength, balance, and mobility deficits.    Follow Up Recommendations  Outpatient PT     Equipment Recommendations  None recommended by PT    Recommendations for Other Services       Precautions / Restrictions Precautions Precautions: Fall Precaution Comments: BP < 140 Restrictions Weight Bearing Restrictions: No    Mobility  Bed Mobility Overal bed mobility: Modified Independent                  Transfers Overall transfer level: Modified independent Equipment used: None                Ambulation/Gait Ambulation/Gait assistance: Supervision Gait Distance (Feet): 250 Feet Assistive device: None;Straight cane Gait Pattern/deviations: Step-through pattern;Decreased stance time - left;Decreased weight shift to left Gait velocity: decreased   General Gait Details: Initial 23' with no AD with patient reaching for objects to grasp. Introduced [facility] to patient with improved stability and comfort for patient.   Stairs Stairs: Yes Stairs assistance: Supervision Stair Management: Two rails;Step to pattern;Forwards Number of Stairs: 2     Wheelchair Mobility    Modified Rankin (Stroke Patients Only) Modified Rankin (Stroke Patients Only) Pre-Morbid Rankin Score: No symptoms Modified Rankin: Moderately severe disability     Balance Overall balance assessment: Needs  assistance Sitting-balance support: No upper extremity supported;Feet supported Sitting balance-Leahy Scale: Good     Standing balance support: No upper extremity supported;During functional activity Standing balance-Leahy Scale: Fair Standing balance comment: reaching for objects during short ambulation. Improved stability with SPC                            Cognition Arousal/Alertness: Awake/alert Behavior During Therapy: Flat affect Overall Cognitive Status: Difficult to assess                                 General Comments: Flat affect throughout but began to open up and joke with PT by end of session. Difficult to fully assess cognition      Exercises      General Comments        Pertinent Vitals/Pain Pain Assessment: Faces Faces Pain Scale: Hurts little more Pain Location: headache Pain Descriptors / Indicators: Headache Pain Intervention(s): Monitored during session    Home Living                      Prior Function            PT Goals (current goals can now be found in the care plan section) Acute Rehab PT Goals Patient Stated Goal: to get rid of headache PT Goal Formulation: With patient Time For Goal Achievement: 02/21/21 Potential to Achieve Goals: Good Progress towards PT goals: Progressing toward goals    Frequency    Min 4X/week      PT Plan Current plan remains appropriate  Co-evaluation              AM-PAC PT "6 Clicks" Mobility   Outcome Measure  Help needed turning from your back to your side while in a flat bed without using bedrails?: None Help needed moving from lying on your back to sitting on the side of a flat bed without using bedrails?: None Help needed moving to and from a bed to a chair (including a wheelchair)?: None Help needed standing up from a chair using your arms (e.g., wheelchair or bedside chair)?: None Help needed to walk in hospital room?: A Little Help needed climbing  3-5 steps with a railing? : A Little 6 Click Score: 22    End of Session   Activity Tolerance: Patient tolerated treatment well Patient left: in bed;with call bell/phone within reach Nurse Communication: Mobility status PT Visit Diagnosis: Unsteadiness on feet (R26.81);Hemiplegia and hemiparesis Hemiplegia - Right/Left: Left Hemiplegia - dominant/non-dominant: Non-dominant Hemiplegia - caused by: Nontraumatic intracerebral hemorrhage     Time: 1020-1040 PT Time Calculation (min) (ACUTE ONLY): 20 min  Charges:  $Gait Training: 8-22 mins                     Joenathan Sakuma A. Gilford Rile PT, DPT Acute Rehabilitation Services Pager [phone] Office [phone]    Linna Hoff 02/09/2021, 12:19 PM
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
## 2021-03-13 ENCOUNTER — Inpatient Hospital Stay (INDEPENDENT_AMBULATORY_CARE_PROVIDER_SITE_OTHER): Payer: Medicaid Other | Admitting: Primary Care

## 2021-03-23 ENCOUNTER — Other Ambulatory Visit (HOSPITAL_COMMUNITY): Payer: Self-pay

## 2021-08-13 ENCOUNTER — Other Ambulatory Visit: Payer: Self-pay

## 2021-08-13 ENCOUNTER — Inpatient Hospital Stay (HOSPITAL_COMMUNITY)
Admission: EM | Admit: 2021-08-13 | Discharge: 2021-08-18 | DRG: 064 | Disposition: A | Discharge status: Home Health | Payer: Self-pay | Attending: Internal Medicine | Admitting: Internal Medicine

## 2021-08-13 ENCOUNTER — Encounter (HOSPITAL_COMMUNITY): Payer: Self-pay | Admitting: *Deleted

## 2021-08-13 ENCOUNTER — Emergency Department (HOSPITAL_COMMUNITY): Payer: Self-pay

## 2021-08-13 DIAGNOSIS — T405X1A Poisoning by cocaine, accidental (unintentional), initial encounter: Secondary | ICD-10-CM | POA: Diagnosis present

## 2021-08-13 DIAGNOSIS — E785 Hyperlipidemia, unspecified: Secondary | ICD-10-CM | POA: Diagnosis present

## 2021-08-13 DIAGNOSIS — Z5900 Homelessness unspecified: Secondary | ICD-10-CM

## 2021-08-13 DIAGNOSIS — G929 Unspecified toxic encephalopathy: Secondary | ICD-10-CM | POA: Diagnosis not present

## 2021-08-13 DIAGNOSIS — I69351 Hemiplegia and hemiparesis following cerebral infarction affecting right dominant side: Secondary | ICD-10-CM

## 2021-08-13 DIAGNOSIS — T466X6A Underdosing of antihyperlipidemic and antiarteriosclerotic drugs, initial encounter: Secondary | ICD-10-CM | POA: Diagnosis present

## 2021-08-13 DIAGNOSIS — F10139 Alcohol abuse with withdrawal, unspecified: Secondary | ICD-10-CM | POA: Diagnosis present

## 2021-08-13 DIAGNOSIS — R42 Dizziness and giddiness: Secondary | ICD-10-CM

## 2021-08-13 DIAGNOSIS — Y92009 Unspecified place in unspecified non-institutional (private) residence as the place of occurrence of the external cause: Secondary | ICD-10-CM

## 2021-08-13 DIAGNOSIS — I6381 Other cerebral infarction due to occlusion or stenosis of small artery: Principal | ICD-10-CM | POA: Diagnosis present

## 2021-08-13 DIAGNOSIS — Z20822 Contact with and (suspected) exposure to covid-19: Secondary | ICD-10-CM | POA: Diagnosis present

## 2021-08-13 DIAGNOSIS — E8881 Metabolic syndrome: Secondary | ICD-10-CM | POA: Diagnosis present

## 2021-08-13 DIAGNOSIS — F1410 Cocaine abuse, uncomplicated: Secondary | ICD-10-CM | POA: Diagnosis present

## 2021-08-13 DIAGNOSIS — T464X6A Underdosing of angiotensin-converting-enzyme inhibitors, initial encounter: Secondary | ICD-10-CM | POA: Diagnosis present

## 2021-08-13 DIAGNOSIS — I160 Hypertensive urgency: Secondary | ICD-10-CM | POA: Diagnosis present

## 2021-08-13 DIAGNOSIS — F1413 Cocaine abuse, unspecified with withdrawal: Secondary | ICD-10-CM | POA: Diagnosis present

## 2021-08-13 DIAGNOSIS — T461X6A Underdosing of calcium-channel blockers, initial encounter: Secondary | ICD-10-CM | POA: Diagnosis present

## 2021-08-13 DIAGNOSIS — R262 Difficulty in walking, not elsewhere classified: Secondary | ICD-10-CM | POA: Diagnosis present

## 2021-08-13 DIAGNOSIS — Z886 Allergy status to analgesic agent status: Secondary | ICD-10-CM

## 2021-08-13 DIAGNOSIS — R0789 Other chest pain: Secondary | ICD-10-CM | POA: Diagnosis present

## 2021-08-13 DIAGNOSIS — F17200 Nicotine dependence, unspecified, uncomplicated: Secondary | ICD-10-CM | POA: Diagnosis present

## 2021-08-13 DIAGNOSIS — F1010 Alcohol abuse, uncomplicated: Secondary | ICD-10-CM | POA: Diagnosis present

## 2021-08-13 DIAGNOSIS — I610 Nontraumatic intracerebral hemorrhage in hemisphere, subcortical: Secondary | ICD-10-CM | POA: Diagnosis present

## 2021-08-13 DIAGNOSIS — Z91199 Patient's noncompliance with other medical treatment and regimen due to unspecified reason: Secondary | ICD-10-CM

## 2021-08-13 DIAGNOSIS — Z9114 Patient's other noncompliance with medication regimen: Secondary | ICD-10-CM

## 2021-08-13 DIAGNOSIS — R531 Weakness: Secondary | ICD-10-CM

## 2021-08-13 DIAGNOSIS — F17210 Nicotine dependence, cigarettes, uncomplicated: Secondary | ICD-10-CM | POA: Diagnosis present

## 2021-08-13 DIAGNOSIS — R29701 NIHSS score 1: Secondary | ICD-10-CM | POA: Diagnosis present

## 2021-08-13 DIAGNOSIS — I674 Hypertensive encephalopathy: Secondary | ICD-10-CM | POA: Diagnosis present

## 2021-08-13 DIAGNOSIS — E119 Type 2 diabetes mellitus without complications: Secondary | ICD-10-CM | POA: Diagnosis present

## 2021-08-13 DIAGNOSIS — R9431 Abnormal electrocardiogram [ECG] [EKG]: Secondary | ICD-10-CM | POA: Diagnosis present

## 2021-08-13 DIAGNOSIS — I10 Essential (primary) hypertension: Secondary | ICD-10-CM | POA: Diagnosis present

## 2021-08-13 DIAGNOSIS — N644 Mastodynia: Secondary | ICD-10-CM

## 2021-08-13 LAB — COMPREHENSIVE METABOLIC PANEL
ALT: 14 U/L (ref 0–44)
AST: 24 U/L (ref 15–41)
Albumin: 3.6 g/dL (ref 3.5–5.0)
Alkaline Phosphatase: 87 U/L (ref 38–126)
Anion gap: 8 (ref 5–15)
BUN: 12 mg/dL (ref 6–20)
CO2: 25 mmol/L (ref 22–32)
Calcium: 9 mg/dL (ref 8.9–10.3)
Chloride: 104 mmol/L (ref 98–111)
Creatinine, Ser: 0.77 mg/dL (ref 0.44–1.00)
GFR, Estimated: >60 mL/min (ref >60)
Glucose, Bld: 215 mg/dL — ABNORMAL HIGH (ref 70–99)
Potassium: 3.7 mmol/L (ref 3.5–5.1)
Sodium: 137 mmol/L (ref 135–145)
Total Bilirubin: 0.5 mg/dL (ref 0.3–1.2)
Total Protein: 7 g/dL (ref 6.5–8.1)

## 2021-08-13 LAB — CBC WITH DIFFERENTIAL/PLATELET
Abs Immature Granulocytes: 0.01 10*3/uL (ref 0.00–0.07)
Basophils Absolute: 0 10*3/uL (ref 0.0–0.1)
Basophils Relative: 1 %
Eosinophils Absolute: 0.1 10*3/uL (ref 0.0–0.5)
Eosinophils Relative: 2 %
HCT: 46.4 % — ABNORMAL HIGH (ref 36.0–46.0)
Hemoglobin: 14.3 g/dL (ref 12.0–15.0)
Immature Granulocytes: 0 %
Lymphocytes Relative: 46 %
Lymphs Abs: 2.9 10*3/uL (ref 0.7–4.0)
MCH: 25 pg — ABNORMAL LOW (ref 26.0–34.0)
MCHC: 30.8 g/dL (ref 30.0–36.0)
MCV: 81.3 fL (ref 80.0–100.0)
Monocytes Absolute: 0.3 10*3/uL (ref 0.1–1.0)
Monocytes Relative: 5 %
Neutro Abs: 2.9 10*3/uL (ref 1.7–7.7)
Neutrophils Relative %: 46 %
Platelets: 306 10*3/uL (ref 150–400)
RBC: 5.71 MIL/uL — ABNORMAL HIGH (ref 3.87–5.11)
RDW: 13.7 % (ref 11.5–15.5)
WBC: 6.3 10*3/uL (ref 4.0–10.5)
nRBC: 0 % (ref 0.0–0.2)

## 2021-08-13 LAB — APTT: aPTT: 31 seconds (ref 24–36)

## 2021-08-13 LAB — PROTIME-INR
INR: 1 (ref 0.8–1.2)
Prothrombin Time: 13 seconds (ref 11.4–15.2)

## 2021-08-13 LAB — ETHANOL: Alcohol, Ethyl (B): <10 mg/dL (ref <10)

## 2021-08-13 NOTE — ED Triage Notes (Signed)
Pt arrives via [facility] with per report by medic, c/o dizziness started about 30 minutes. Hx of 2 strokes in the past. Has not had her BP meds "in a while". 200/130 en route. NSR 100, 12 lead WNL. Unable to start line. Denies pain

## 2021-08-13 NOTE — ED Provider Notes (Addendum)
Emergency Medicine Provider Triage Evaluation Note  Roberta Calderon , a 50 y.o. female  was evaluated in triage.  Pt complains of dizziness.  States started about 30 mins ago, also has headache.  Has been out of BP meds for a few weeks now.  Was admitted June 2022 with hypertensive bleed.  Review of Systems  Positive: Dizziness, headache Negative: Fever, cough, chest pain  Physical Exam  BP (!) 175/113    Pulse 96    Temp 98.7 F (37.1 C) (Oral)    Resp 13    SpO2 96%   Gen:   Awake, no distress   Resp:  Normal effort  MSK:   Moves extremities without difficulty  Other:  AAOx3, no focal weakness, answering questions and following commands , speech clear, no facial droop noted  Medical Decision Making  Medically screening exam initiated at 11:07 PM.  Appropriate orders placed.  ARVIE VILLARRUEL was informed that the remainder of the evaluation will be completed by another provider, this initial triage assessment does not replace that evaluation, and the importance of remaining in the ED until their evaluation is complete.  Dizzy with headache and HTN.  Admitted in June 2022 for hypertensive bleed.  Has been out of BP meds for several weeks, pressure now 175/113.  No focal deficits noted in triage so code stroke not activated. Will check EKG, labs, CT head.   Larene Pickett, PA-C 08/13/21 2311    Larene Pickett, PA-C 08/13/21 2311    Orpah Greek, MD 08/13/21 2329

## 2021-08-14 ENCOUNTER — Emergency Department (HOSPITAL_COMMUNITY): Payer: Self-pay

## 2021-08-14 DIAGNOSIS — E8881 Metabolic syndrome: Secondary | ICD-10-CM

## 2021-08-14 DIAGNOSIS — F1410 Cocaine abuse, uncomplicated: Secondary | ICD-10-CM

## 2021-08-14 DIAGNOSIS — F1010 Alcohol abuse, uncomplicated: Secondary | ICD-10-CM

## 2021-08-14 DIAGNOSIS — R9431 Abnormal electrocardiogram [ECG] [EKG]: Secondary | ICD-10-CM

## 2021-08-14 DIAGNOSIS — I160 Hypertensive urgency: Secondary | ICD-10-CM

## 2021-08-14 DIAGNOSIS — R531 Weakness: Secondary | ICD-10-CM

## 2021-08-14 DIAGNOSIS — I6381 Other cerebral infarction due to occlusion or stenosis of small artery: Secondary | ICD-10-CM | POA: Diagnosis present

## 2021-08-14 HISTORY — DX: Cocaine abuse, uncomplicated: F14.10

## 2021-08-14 LAB — TROPONIN I (HIGH SENSITIVITY)
Troponin I (High Sensitivity): 16 ng/L (ref <18)
Troponin I (High Sensitivity): 13 ng/L (ref <18)
Troponin I (High Sensitivity): 12 ng/L (ref <18)

## 2021-08-14 LAB — RESP PANEL BY RT-PCR (FLU A&B, COVID) ARPGX2
Influenza A by PCR: NEGATIVE
Influenza B by PCR: NEGATIVE
SARS Coronavirus 2 by RT PCR: NEGATIVE

## 2021-08-14 IMAGING — MR MR HEAD W/O CM
6 of 11 series · 24 of 48 positions shown · non-contrast
Comparison: Noncontrast head CT [DATE]. CT angiogram head/neck
[DATE]. And MRI [DATE]

CLINICAL DATA: Provided history: Neuro deficit, acute, stroke
suspected.

EXAM:
MRI HEAD WITHOUT CONTRAST
TECHNIQUE: Multiplanar, multiecho pulse sequences of the brain and surrounding
structures were obtained without intravenous contrast.

[Series 2: DWI · axial · 3.0mm · 0.94mm/px · z∈[-93,+50]mm · 7 of 100 slices shown (1 of 2)]
[im 1/100]
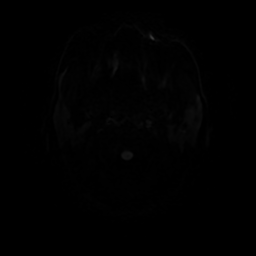
[im 17/100]
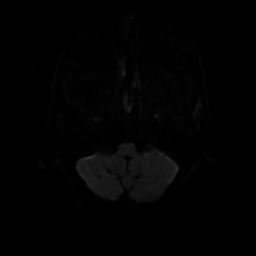
[im 34/100]
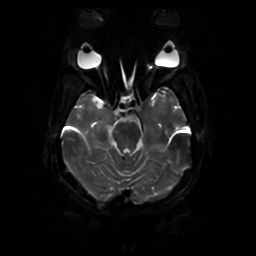
[im 50/100]
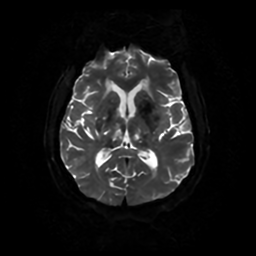
[im 67/100]
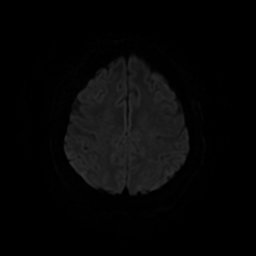
[im 83/100]
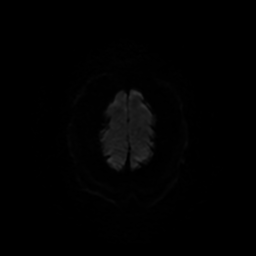
[im 100/100]
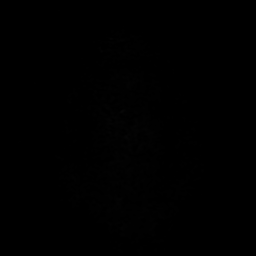

[Series 3: DWI · coronal · 4.0mm · 0.94mm/px · 5 of 74 slices shown (2 of 2)]
[im 1/74]
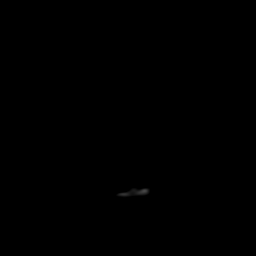
[im 19/74]
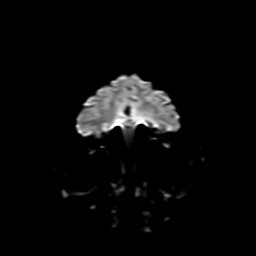
[im 37/74]
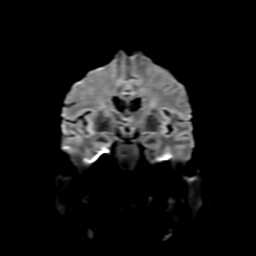
[im 55/74]
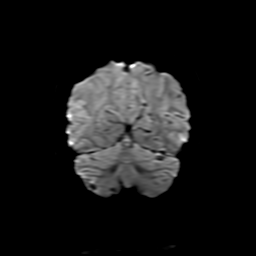
[im 74/74]
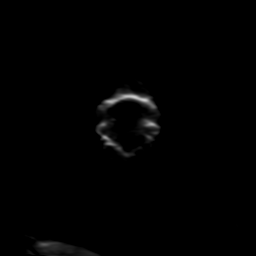

[Series 4: FLAIR · sagittal · 5.0mm · 0.23mm/px · 2 of 27 slices shown (1 of 2)]
[im 1/27]
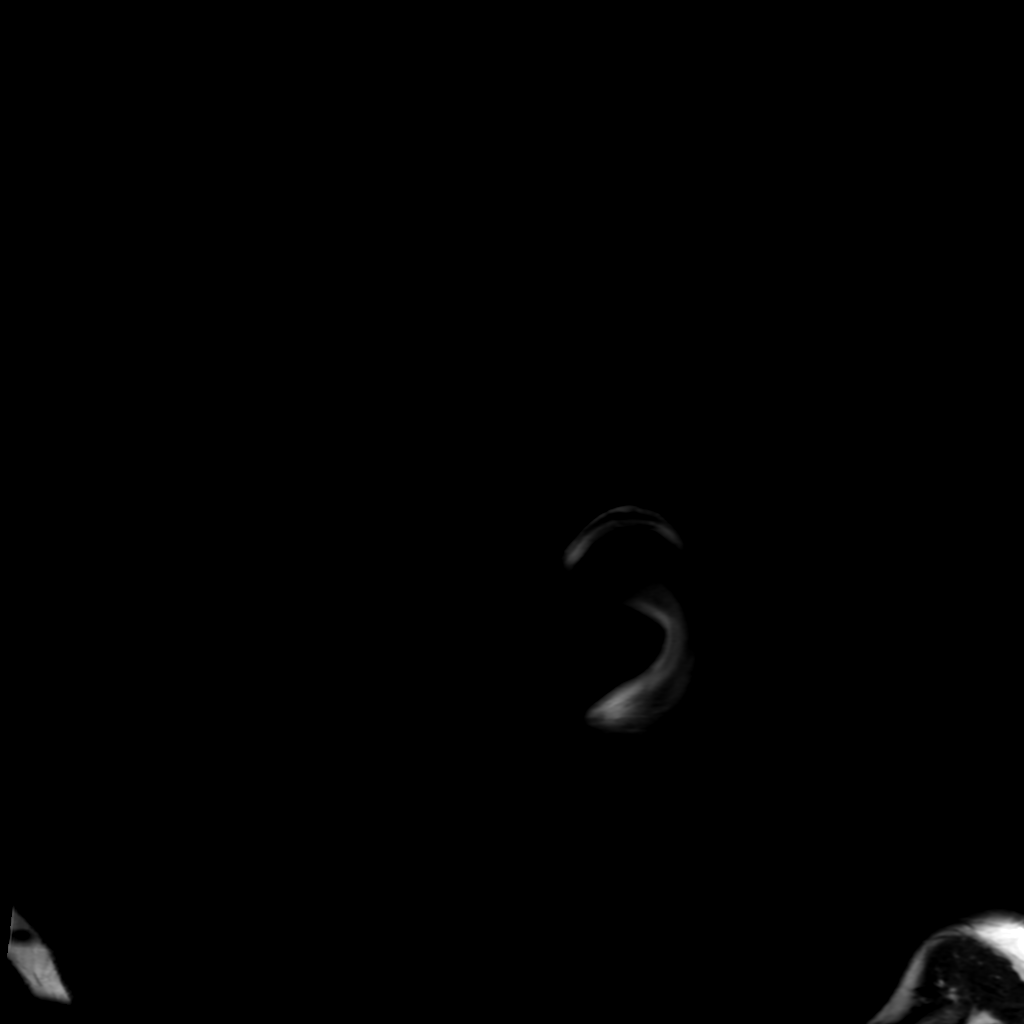
[im 27/27]
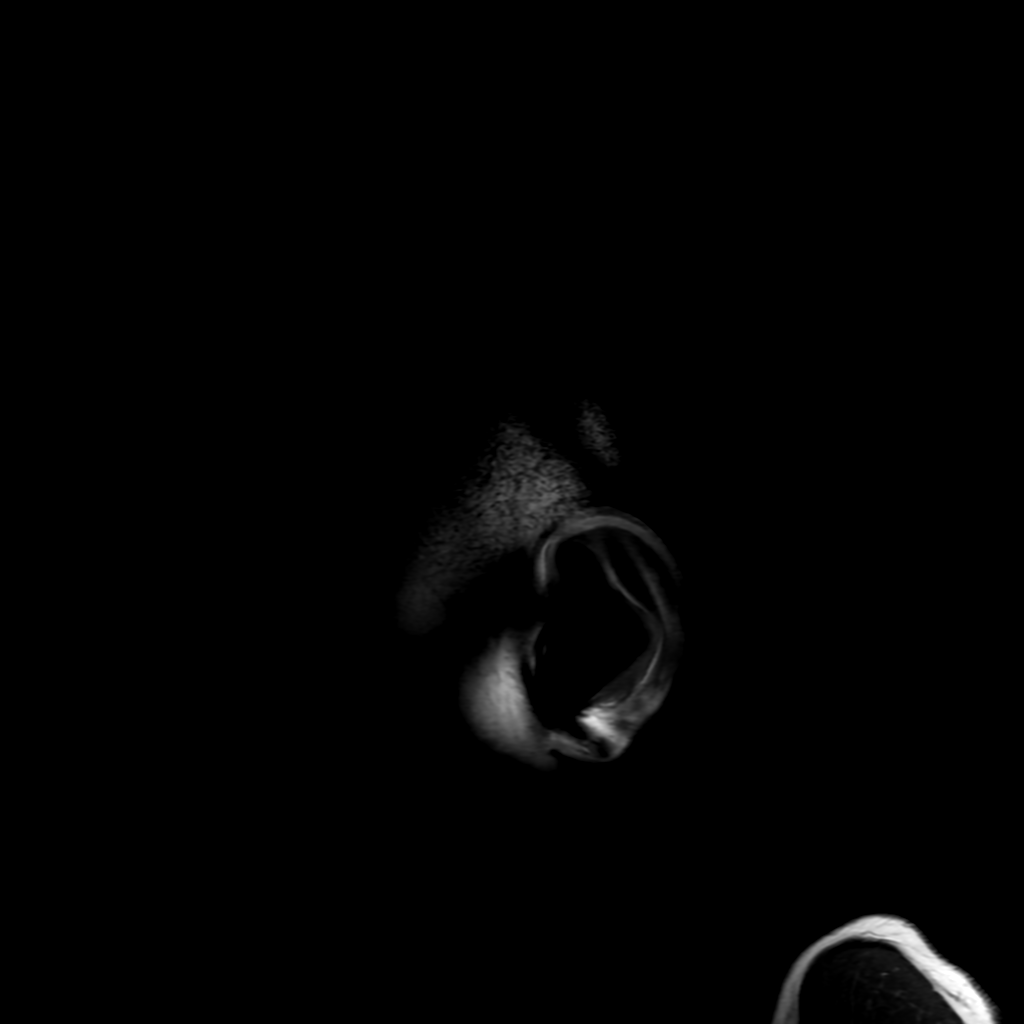

[Series 6: FLAIR · axial · 4.0mm · 0.45mm/px · z∈[-96,+44]mm · 3 of 34 slices shown (2 of 2)]
[im 1/34]
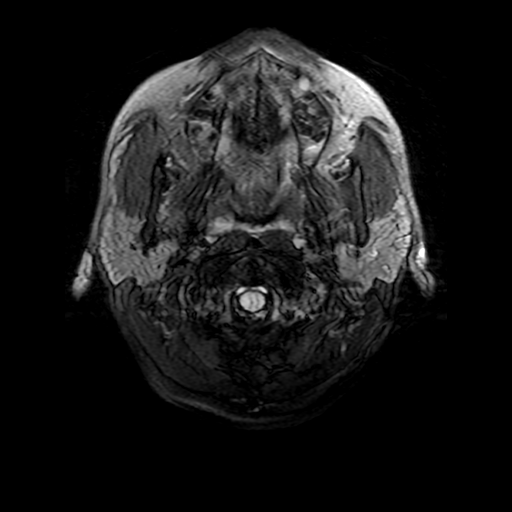
[im 17/34]
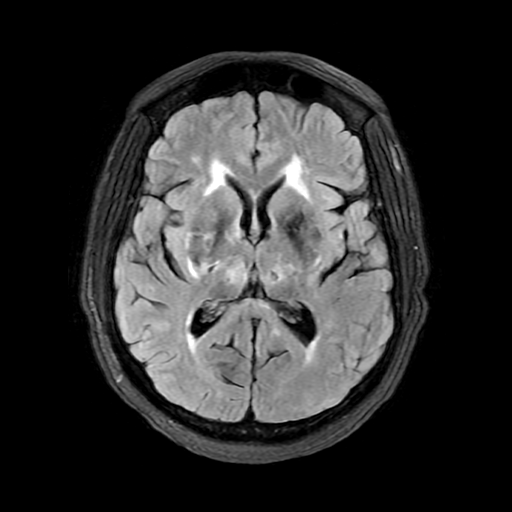
[im 34/34]
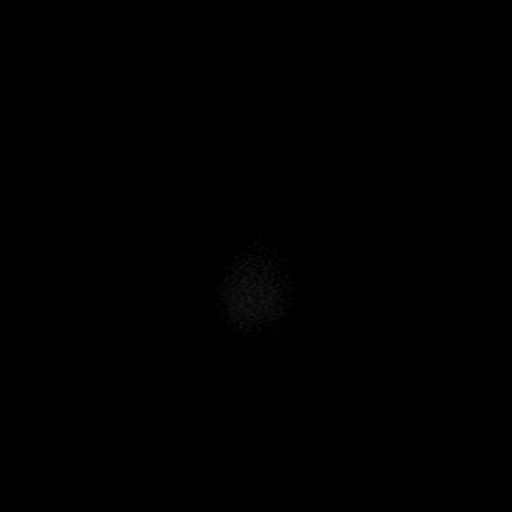

[Series 250: ADC · axial · 3.0mm · 0.94mm/px · z∈[-93,+50]mm · 4 of 50 slices shown (1 of 2)]
[im 1/50]
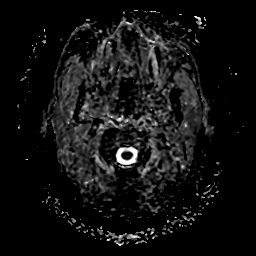
[im 17/50]
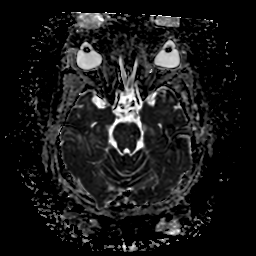
[im 33/50]
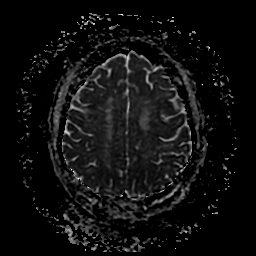
[im 50/50]
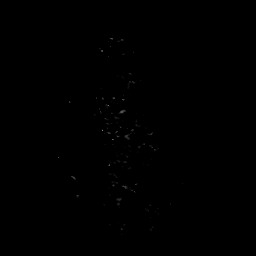

[Series 350: ADC · coronal · 4.0mm · 0.94mm/px · 3 of 37 slices shown (2 of 2)]
[im 1/37]
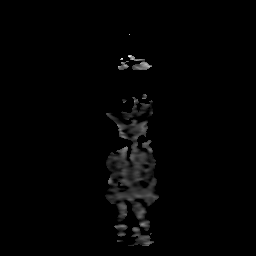
[im 19/37]
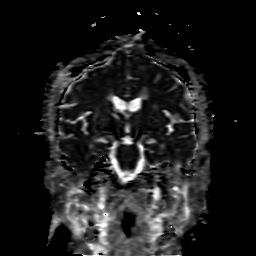
[im 37/37]
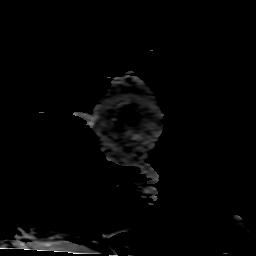

[24 of 48 positions shown; findings below may reference images not displayed]

FINDINGS: Brain:

Mild-to-moderate intermittent motion degradation.

Cerebral volume is normal for age.

4 mm focus of restricted diffusion within the right lentiform
nucleus, compatible with acute/subacute infarct.

Multiple chronic lacunar infarcts within the bilateral basal
ganglia/internal capsule and thalami, as well as pons.

Background moderate multifocal T2 FLAIR hyperintense signal
abnormality within the cerebral white matter and pons, nonspecific
but compatible with chronic small vessel ischemic disease. Chronic
small vessel ischemic changes are also present within the right
middle cerebellar peduncle. Small chronic infarct within the left
cerebellar hemisphere.

Fairly numerous supratentorial and infratentorial chronic
parenchymal microhemorrhages with a central and posterior fossa were
dominance, likely reflecting sequela of hypertensive
microangiopathy.

No evidence of an intracranial mass

No extra-axial fluid collection.

No midline shift.

Vascular: Maintained flow voids within the proximal large arterial
vessels.

Skull and upper cervical spine: No focal suspicious marrow lesion.
Incompletely assessed cervical spondylosis.

Sinuses/Orbits: Visualized orbits show no acute finding. Mild
mucosal thickening within the paranasal sinuses, greatest within the
posterior right ethmoid air cells and left maxillary sinus.
IMPRESSION: Motion degraded exam.

4 mm acute/early subacute lacunar infarct within the right basal
ganglia.

Multiple chronic lacunar infarcts within the bilateral basal
ganglia/internal capsules and thalami, as well as pons.

Background moderate chronic small vessel ischemic changes within the
cerebral white matter and pons.

Chronic small-vessel ischemic changes are also present within the
right middle cerebellar peduncle.

Small chronic infarct within the left cerebellar hemisphere.

Fairly numerous supratentorial and infratentorial chronic
parenchymal microhemorrhages, in a distribution suggesting sequela
of chronic hypertensive microangiopathy.

Mild paranasal sinus disease, as described.

## 2021-08-14 IMAGING — CT CT ANGIO CHEST
3 of 7 series · 19 of 36 positions shown · IV contrast (omnipaque)
Comparison: None

CLINICAL DATA: Dizziness, hypertensive, high clinical suspicion of
pulmonary embolism

EXAM:
CT ANGIOGRAPHY CHEST WITH CONTRAST
TECHNIQUE: Multidetector CT imaging of the chest was performed using the
standard protocol during bolus administration of intravenous
contrast. Multiplanar CT image reconstructions and MIPs were
obtained to evaluate the vascular anatomy.
CONTRAST:  100mL OMNIPAQUE IOHEXOL 350 MG/ML SOLN IV

[Series 6: pe lung · axial · 0.77mm/px · z∈[+1165,+1297]mm · 3 of 133 slices shown]
[im 34/133  mediastinal]
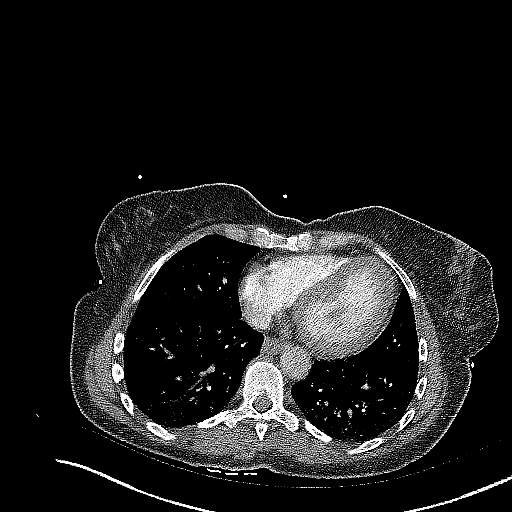
[im 67/133  mediastinal]
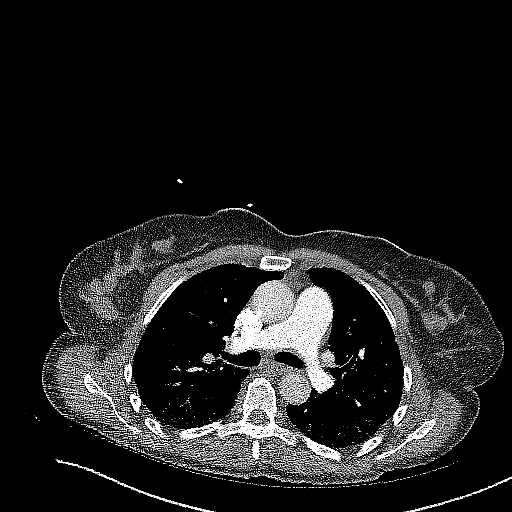
[im 100/133  mediastinal]
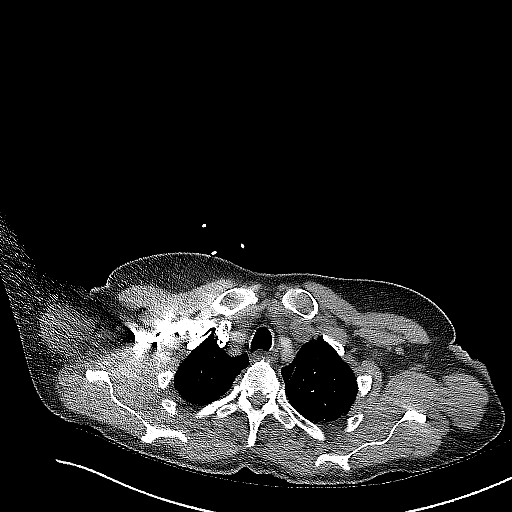

[Series 7: pe thins · axial · 0.77mm/px · z∈[+1056,+1342]mm · 15 of 468 slices shown]
[im 30/468  lung]
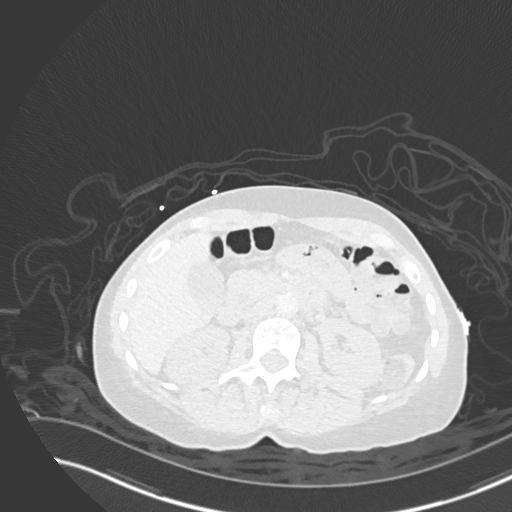
[im 59/468  mediastinal]
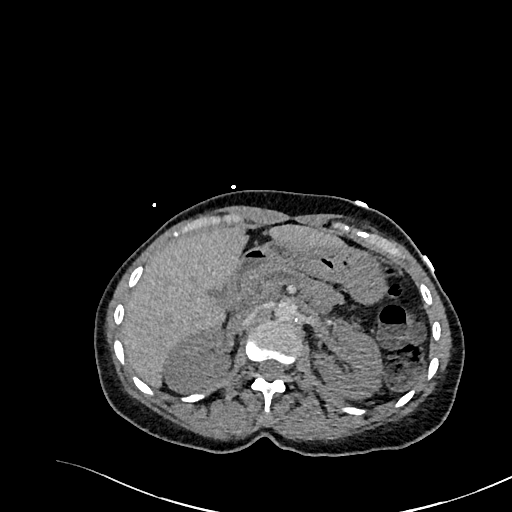
[im 88/468  lung]
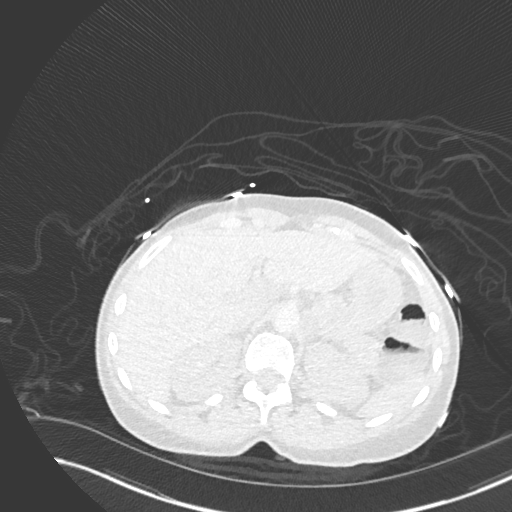
[im 117/468  mediastinal]
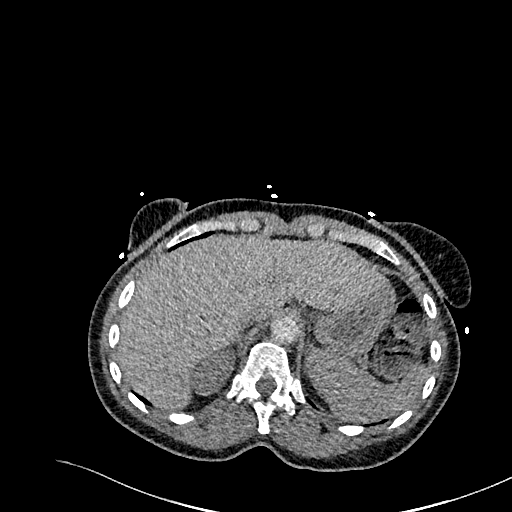
[im 146/468  lung]
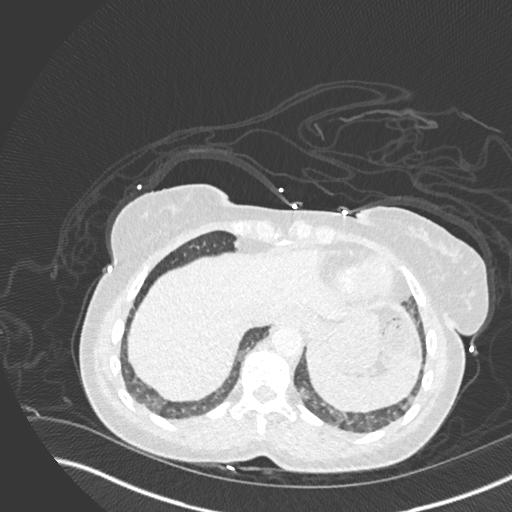
[im 176/468  mediastinal]
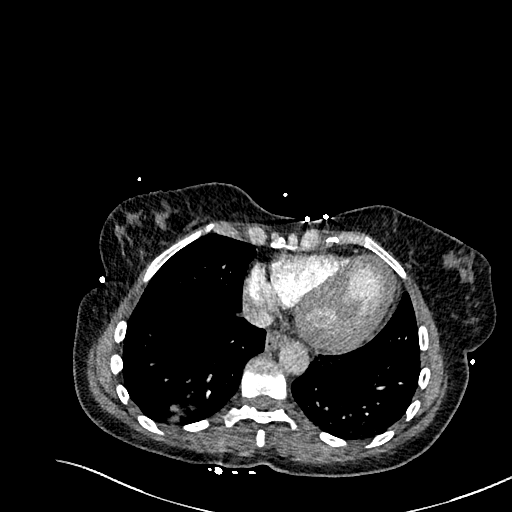
[im 205/468  lung]
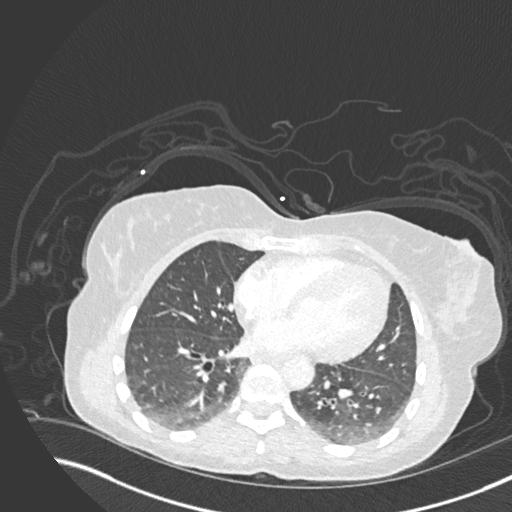
[im 234/468  mediastinal]
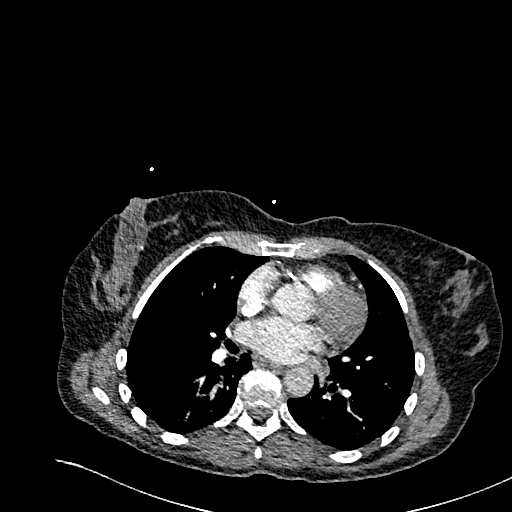
[im 263/468  lung]
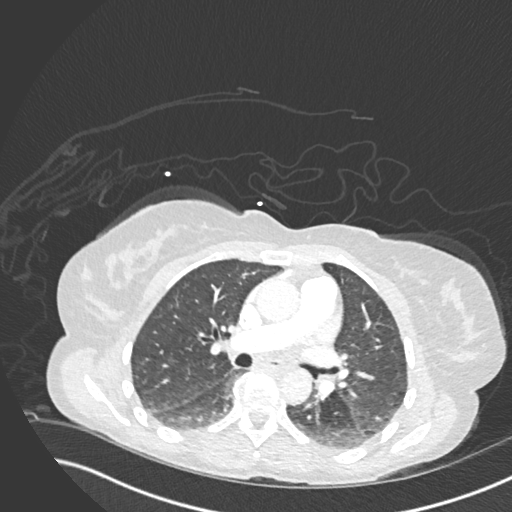
[im 292/468  mediastinal]
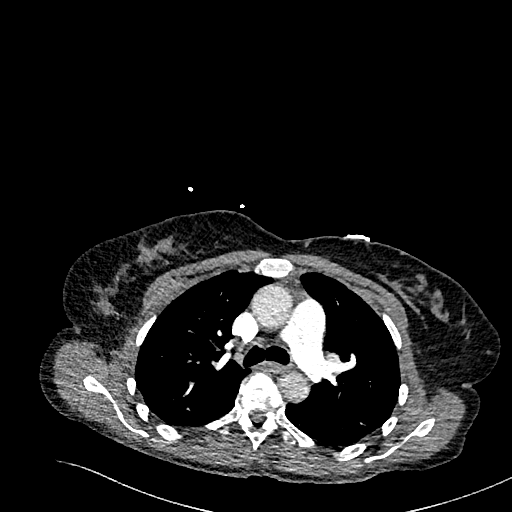
[im 322/468  lung]
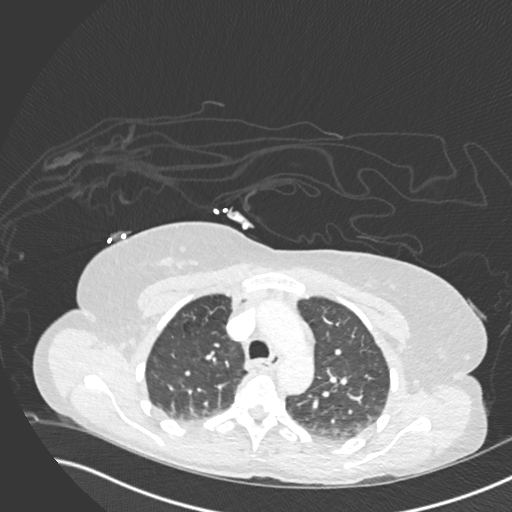
[im 351/468  mediastinal]
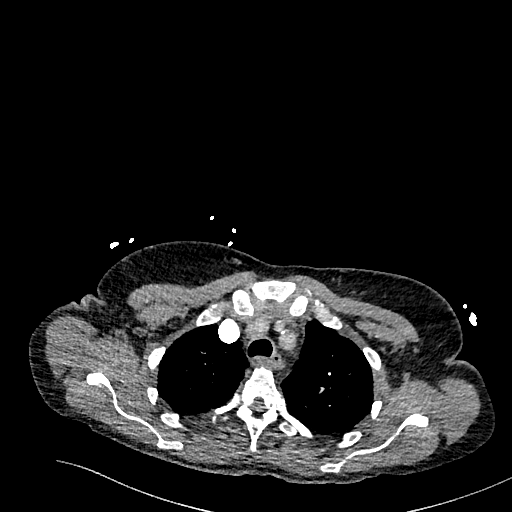
[im 380/468  lung]
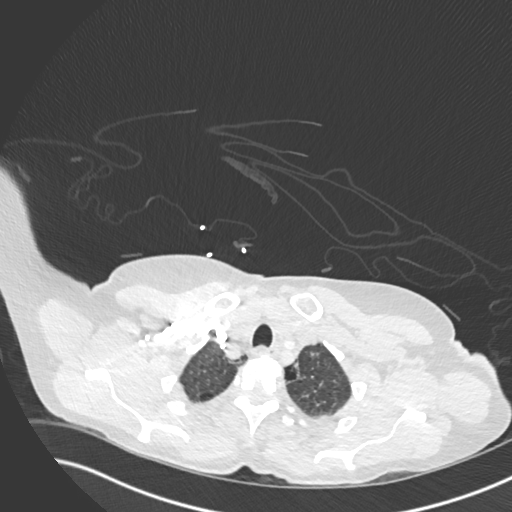
[im 409/468  mediastinal]
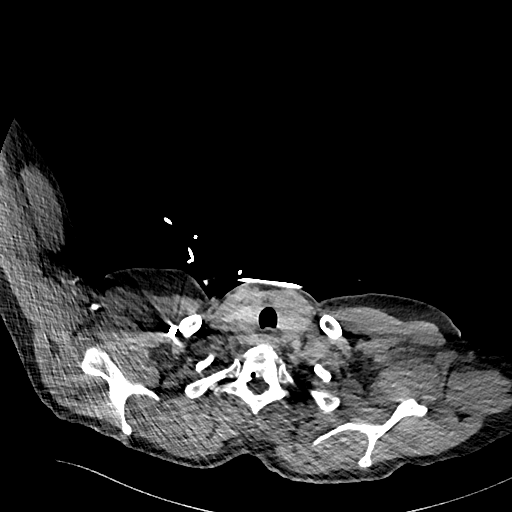
[im 438/468  lung]
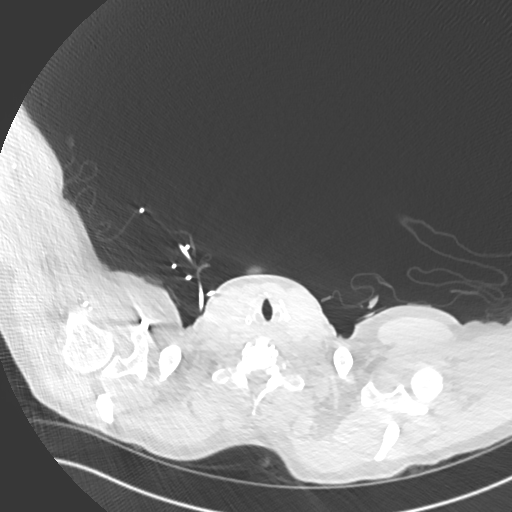

[Series 8: pe 2mm cor · coronal · 0.57mm/px · 1 of 113 slices shown]
[im 57/113  mediastinal]
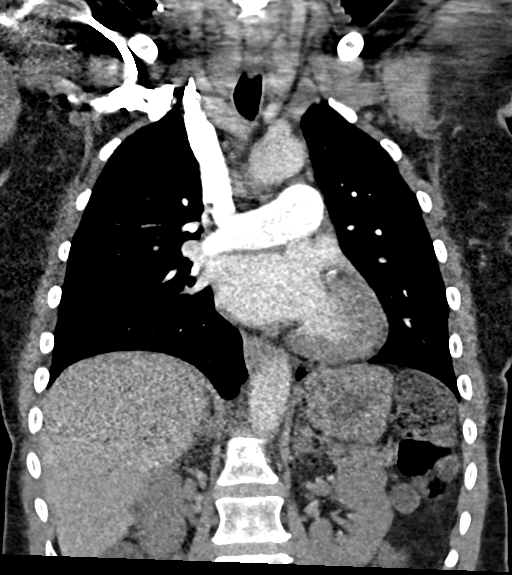

[19 of 36 positions shown; findings below may reference images not displayed]

FINDINGS: Cardiovascular: Atherosclerotic calcifications aorta, proximal great
vessels, and coronary arteries. Aorta normal caliber without
aneurysm or dissection. Heart chambers normal size. No pericardial
effusion. Pulmonary arteries adequately opacified and patent. No
evidence of pulmonary embolism.

Mediastinum/Nodes: Base of cervical region normal appearance. No
thoracic adenopathy. Esophagus unremarkable.

Lungs/Pleura: Mild dependent atelectasis in the posterior lungs.
Lungs otherwise clear. Central peribronchial thickening. No
segmental infiltrate, pleural effusion, or pneumothorax.

Upper Abdomen: No upper abdominal abnormalities

Musculoskeletal: Osseous structures unremarkable.

Review of the MIP images confirms the above findings.
IMPRESSION: No evidence of pulmonary embolism.

Bronchitic changes with dependent atelectasis in the posterior lower
lobes.

Scattered atherosclerotic calcifications including coronary
arteries.

Aortic Atherosclerosis ([U5]-[U5]).

## 2021-08-14 MED ORDER — IOHEXOL 350 MG/ML SOLN
100.0000 mL | Freq: Once | INTRAVENOUS | Status: AC | PRN
  Administered 2021-08-14: 13:00:00 100 mL via INTRAVENOUS

## 2021-08-14 MED ORDER — NICOTINE 14 MG/24HR TD PT24
14.0000 mg | MEDICATED_PATCH | Freq: Every day | TRANSDERMAL | Status: DC
  Administered 2021-08-15 – 2021-08-18 (×4): 14 mg via TRANSDERMAL
  Filled 2021-08-14 (×4): qty 1

## 2021-08-14 MED ORDER — LORAZEPAM 1 MG PO TABS
1.0000 mg | ORAL_TABLET | ORAL | Status: AC | PRN
  Administered 2021-08-16: 1 mg via ORAL
  Filled 2021-08-14 (×2): qty 1

## 2021-08-14 MED ORDER — LORAZEPAM 2 MG/ML IJ SOLN
1.0000 mg | INTRAMUSCULAR | Status: AC | PRN
  Administered 2021-08-15: 12:00:00 1 mg via INTRAVENOUS
  Filled 2021-08-14: qty 1

## 2021-08-14 MED ORDER — METOPROLOL TARTRATE 5 MG/5ML IV SOLN: 5.0000 mg | Freq: Once | INTRAVENOUS | Status: DC

## 2021-08-14 MED ORDER — THIAMINE HCL 100 MG/ML IJ SOLN: 100.0000 mg | Freq: Every day | INTRAMUSCULAR | Status: DC

## 2021-08-14 MED ORDER — LORAZEPAM 2 MG/ML IJ SOLN
1.0000 mg | Freq: Once | INTRAMUSCULAR | Status: AC
  Administered 2021-08-14: 18:00:00 1 mg via INTRAVENOUS
  Filled 2021-08-14: qty 1

## 2021-08-14 MED ORDER — HYDROMORPHONE HCL 1 MG/ML IJ SOLN
1.0000 mg | Freq: Once | INTRAMUSCULAR | Status: AC
  Administered 2021-08-14: 15:00:00 1 mg via INTRAVENOUS
  Filled 2021-08-14: qty 1

## 2021-08-14 MED ORDER — SODIUM CHLORIDE 0.9 % IV SOLN: INTRAVENOUS | Status: DC

## 2021-08-14 MED ORDER — ACETAMINOPHEN 325 MG PO TABS
650.0000 mg | ORAL_TABLET | ORAL | Status: DC | PRN
  Administered 2021-08-15 – 2021-08-16 (×2): 650 mg via ORAL
  Filled 2021-08-14 (×2): qty 2

## 2021-08-14 MED ORDER — MORPHINE SULFATE (PF) 4 MG/ML IV SOLN
4.0000 mg | Freq: Once | INTRAVENOUS | Status: AC
  Administered 2021-08-14: 12:00:00 4 mg via INTRAVENOUS
  Filled 2021-08-14: qty 1

## 2021-08-14 MED ORDER — ACETAMINOPHEN 650 MG RE SUPP: 650.0000 mg | RECTAL | Status: DC | PRN

## 2021-08-14 MED ORDER — CLOPIDOGREL BISULFATE 75 MG PO TABS
75.0000 mg | ORAL_TABLET | Freq: Every day | ORAL | Status: DC
  Administered 2021-08-15: 10:00:00 75 mg via ORAL
  Filled 2021-08-14: qty 1

## 2021-08-14 MED ORDER — ACETAMINOPHEN 160 MG/5ML PO SOLN: 650.0000 mg | ORAL | Status: DC | PRN

## 2021-08-14 MED ORDER — STROKE: EARLY STAGES OF RECOVERY BOOK
Freq: Once | Status: AC
  Administered 2021-08-14: 23:00:00 1
  Filled 2021-08-14: qty 1

## 2021-08-14 MED ORDER — LORAZEPAM 1 MG PO TABS
1.0000 mg | ORAL_TABLET | ORAL | Status: AC | PRN
  Administered 2021-08-14: 17:00:00 1 mg via ORAL
  Filled 2021-08-14: qty 1

## 2021-08-14 MED ORDER — ATORVASTATIN CALCIUM 80 MG PO TABS
80.0000 mg | ORAL_TABLET | Freq: Every day | ORAL | Status: DC
  Administered 2021-08-15 – 2021-08-18 (×4): 80 mg via ORAL
  Filled 2021-08-14 (×4): qty 1

## 2021-08-14 MED ORDER — LISINOPRIL 20 MG PO TABS
40.0000 mg | ORAL_TABLET | Freq: Every day | ORAL | Status: DC
  Administered 2021-08-14: 11:00:00 40 mg via ORAL
  Filled 2021-08-14: qty 2

## 2021-08-14 MED ORDER — AMLODIPINE BESYLATE 10 MG PO TABS
10.0000 mg | ORAL_TABLET | Freq: Every day | ORAL | Status: DC
  Administered 2021-08-15 – 2021-08-18 (×4): 10 mg via ORAL
  Filled 2021-08-14 (×4): qty 1

## 2021-08-14 MED ORDER — SENNOSIDES-DOCUSATE SODIUM 8.6-50 MG PO TABS: 1.0000 | ORAL_TABLET | Freq: Every evening | ORAL | Status: DC | PRN

## 2021-08-14 MED ORDER — HYDROCODONE-ACETAMINOPHEN 5-325 MG PO TABS
1.0000 | ORAL_TABLET | Freq: Once | ORAL | Status: AC
  Administered 2021-08-14: 11:00:00 1 via ORAL
  Filled 2021-08-14: qty 1

## 2021-08-14 MED ORDER — LOSARTAN POTASSIUM 50 MG PO TABS
50.0000 mg | ORAL_TABLET | Freq: Every day | ORAL | Status: DC
  Administered 2021-08-15 – 2021-08-18 (×4): 50 mg via ORAL
  Filled 2021-08-14 (×5): qty 1

## 2021-08-14 MED ORDER — ADULT MULTIVITAMIN W/MINERALS CH
1.0000 | ORAL_TABLET | Freq: Every day | ORAL | Status: DC
  Administered 2021-08-15 – 2021-08-18 (×4): 1 via ORAL
  Filled 2021-08-14 (×4): qty 1

## 2021-08-14 MED ORDER — FOLIC ACID 1 MG PO TABS
1.0000 mg | ORAL_TABLET | Freq: Every day | ORAL | Status: DC
  Administered 2021-08-15 – 2021-08-18 (×4): 1 mg via ORAL
  Filled 2021-08-14 (×4): qty 1

## 2021-08-14 MED ORDER — THIAMINE HCL 100 MG PO TABS
100.0000 mg | ORAL_TABLET | Freq: Every day | ORAL | Status: DC
  Administered 2021-08-15 – 2021-08-18 (×4): 100 mg via ORAL
  Filled 2021-08-14 (×4): qty 1

## 2021-08-14 MED ORDER — LABETALOL HCL 5 MG/ML IV SOLN
5.0000 mg | Freq: Once | INTRAVENOUS | Status: AC
  Administered 2021-08-14: 13:00:00 5 mg via INTRAVENOUS
  Filled 2021-08-14: qty 4

## 2021-08-14 NOTE — ED Notes (Signed)
Pt reports right sided cp that radiates in to the right arm and radiates to the back 8/10. She is coughing during reassessment, says she "just got choked up on something". Pressure is elevated in left arm, refusing to have pressure taken in the left arm.

## 2021-08-14 NOTE — ED Provider Notes (Signed)
[facility] Provider Note   CSN: [id number] Arrival date & time: 08/13/21  2258     History No chief complaint on file.   Roberta Calderon is a 50 y.o. female.  HPI  50 year old female with medical history significant for hypertension, hyperlipidemia, prior ICH and right thalamic infarction who initially presented to the emergency department with a chief complaint of headache and lightheadedness.  The patient has been out of her blood pressure medicine for a few weeks.  She previously had a hypertensive bleed in June 2022.  She presented overnight to the emergency department due to a new onset headache with associated hypertension and lightheadedness.  While waiting in the emergency department, the patient did develop right-sided chest discomfort, described as sharp, shooting, nonradiating, associated with mild shortness of breath, no nausea, vomiting or diaphoresis.  The patient stated that her headache resolved overnight while waiting in the emergency department.  She states that she has left-sided weakness at baseline from her prior strokes but since yesterday afternoon has had new right hemibody weakness.  No new numbness or facial droop.  Past Medical History:  Diagnosis Date   Hypertension     Patient Active Problem List   Diagnosis Date Noted   ICH (intracerebral hemorrhage) ([place]) 02/07/2021   Dyslipidemia    Right thalamic infarction ([place]) 04/08/2020   Prediabetes    Polysubstance abuse ([place])    Hemiparesis affecting left side as late effect of stroke ([place])    Ischemic stroke ([place]) 04/06/2020   Hypertension 04/06/2020   Tobacco use disorder 04/06/2020    History reviewed. No pertinent surgical history.   OB History   No obstetric history on file.     No family history on file.  Social History   Tobacco Use   Smoking status: Every Day    Packs/day: 0.25    Types: Cigarettes   Smokeless tobacco: Never  Substance Use  Topics   Alcohol use: Yes   Drug use: No    Home Medications Prior to Admission medications   Medication Sig Start Date End Date Taking? Authorizing Provider  amLODipine (NORVASC) 10 MG tablet Take 1 tablet (10 mg total) by mouth daily. Patient not taking: Reported on 08/14/2021 02/09/21 02/09/22  Einar Pheasant, NP  atorvastatin (LIPITOR) 80 MG tablet Take 1 tablet (80 mg total) by mouth daily. Patient not taking: Reported on 08/14/2021 02/09/21 02/09/22  Einar Pheasant, NP  lisinopril (ZESTRIL) 40 MG tablet Take 1 tablet (40 mg total) by mouth daily. Patient not taking: Reported on 08/14/2021 02/10/21   Einar Pheasant, NP    Allergies    Nsaids and Aspirin  Review of Systems   Review of Systems  Constitutional:  Negative for chills, diaphoresis and fever.  HENT:  Negative for ear pain and sore throat.   Eyes:  Negative for pain and visual disturbance.  Respiratory:  Positive for shortness of breath. Negative for cough.   Cardiovascular:  Positive for chest pain. Negative for palpitations.  Gastrointestinal:  Negative for abdominal pain and vomiting.  Genitourinary:  Negative for dysuria and hematuria.  Musculoskeletal:  Negative for arthralgias and back pain.  Skin:  Negative for color change and rash.  Neurological:  Positive for light-headedness and headaches. Negative for seizures, syncope, facial asymmetry, speech difficulty and weakness.  All other systems reviewed and are negative.  Physical Exam Updated Vital Signs BP (!) 149/111 (BP Location: Left Arm)    Pulse 92  Temp 98.4 F (36.9 C) (Oral)    Resp 14    SpO2 94%   Physical Exam Vitals and nursing note reviewed.  Constitutional:      General: She is not in acute distress.    Appearance: She is well-developed.  HENT:     Head: Normocephalic and atraumatic.  Eyes:     Conjunctiva/sclera: Conjunctivae normal.     Pupils: Pupils are equal, round, and reactive to light.  Cardiovascular:     Rate and  Rhythm: Normal rate and regular rhythm.     Heart sounds: No murmur heard. Pulmonary:     Effort: Pulmonary effort is normal. No respiratory distress.     Breath sounds: Normal breath sounds.  Chest:     Comments: Right chest wall tenderness to palpation Abdominal:     General: There is no distension.     Palpations: Abdomen is soft.     Tenderness: There is no abdominal tenderness. There is no guarding.  Musculoskeletal:        General: No swelling, deformity or signs of injury.     Cervical back: Neck supple.  Skin:    General: Skin is warm and dry.     Capillary Refill: Capillary refill takes less than 2 seconds.     Findings: No lesion or rash.  Neurological:     Mental Status: She is alert.     Comments: MENTAL STATUS EXAM:    Orientation: Alert and oriented to person, place and time.  Memory: Cooperative, follows commands well.  Language: Speech is clear and language is normal.   CRANIAL NERVES:    CN 2 (Optic): Visual fields intact to confrontation.  CN 3,4,6 (EOM): Pupils equal and reactive to light. Full extraocular eye movement without nystagmus.  CN 5 (Trigeminal): Facial sensation is normal, no weakness of masticatory muscles.  CN 7 (Facial): No facial weakness or asymmetry.  CN 8 (Auditory): Auditory acuity grossly normal.  CN 9,10 (Glossophar): The uvula is midline, the palate elevates symmetrically.  CN 11 (spinal access): Normal sternocleidomastoid and trapezius strength.  CN 12 (Hypoglossal): The tongue is midline. No atrophy or fasciculations.Marland Kitchen   MOTOR:  Muscle Strength: 4/5RUE, 5/5LUE, 4/5RLE, 5/5LLE  REFLEXES: No clonus.   COORDINATION:   Intact finger-to-nose, no tremor, no pronator drift.   SENSATION:   Intact to light touch all four extremities.  GAIT: Gait not assessed   Psychiatric:        Mood and Affect: Mood normal.    ED Results / Procedures / Treatments   Labs (all labs ordered are listed, but only abnormal results are displayed) Labs  Reviewed  CBC WITH DIFFERENTIAL/PLATELET - Abnormal; Notable for the following components:      Result Value   RBC 5.71 (*)    HCT 46.4 (*)    MCH 25.0 (*)    All other components within normal limits  COMPREHENSIVE METABOLIC PANEL - Abnormal; Notable for the following components:   Glucose, Bld 215 (*)    All other components within normal limits  PROTIME-INR  ETHANOL  APTT  RAPID URINE DRUG SCREEN, HOSP PERFORMED  TROPONIN I (HIGH SENSITIVITY)  TROPONIN I (HIGH SENSITIVITY)  TROPONIN I (HIGH SENSITIVITY)  TROPONIN I (HIGH SENSITIVITY)    EKG EKG Interpretation  Date/Time:  Monday August 14 2021 14:36:48 EST Ventricular Rate:  94 PR Interval:  157 QRS Duration: 84 QT Interval:  409 QTC Calculation: 512 R Axis:   208 Text Interpretation: Sinus rhythm Right atrial enlargement  S1,S2,S3 pattern Nonspecific T abnrm, anterolateral leads Prolonged QT interval Confirmed by Regan Lemming ([id number]) on 08/14/2021 2:44:40 PM  Radiology CT HEAD WO CONTRAST (5MM)  Result Date: 08/13/2021 CLINICAL DATA:  Dizziness, nonspecific EXAM: CT HEAD WITHOUT CONTRAST TECHNIQUE: Contiguous axial images were obtained from the base of the skull through the vertex without intravenous contrast. COMPARISON:  02/07/2021 FINDINGS: Brain: No evidence of acute infarction, hemorrhage, cerebral edema, mass, mass effect, or midline shift. No hydrocephalus or extra-axial fluid collection. Previously noted hyperdense material in the left pons is no longer seen, with mild hypodensity there, likely sequela of prior hypertensive microhemorrhage. Redemonstrated lacunar infarcts in the bilateral thalami and basal ganglia. Periventricular white matter changes, likely the sequela of chronic small vessel ischemic disease. Vascular: No hyperdense vessel. Skull: Normal. Negative for fracture or focal lesion. Sinuses/Orbits: Mild mucosal thickening in the posterior right ethmoid air cells. The orbits are unremarkable. Other: The  mastoid air cells are well aerated. IMPRESSION: IMPRESSION No acute intracranial process. Electronically Signed   By: Merilyn Baba M.D.   On: 08/13/2021 23:23   CT Angio Chest PE W and/or Wo Contrast  Result Date: 08/14/2021 CLINICAL DATA:  Dizziness, hypertensive, high clinical suspicion of pulmonary embolism EXAM: CT ANGIOGRAPHY CHEST WITH CONTRAST TECHNIQUE: Multidetector CT imaging of the chest was performed using the standard protocol during bolus administration of intravenous contrast. Multiplanar CT image reconstructions and MIPs were obtained to evaluate the vascular anatomy. CONTRAST:  149mL OMNIPAQUE IOHEXOL 350 MG/ML SOLN IV COMPARISON:  None FINDINGS: Cardiovascular: Atherosclerotic calcifications aorta, proximal great vessels, and coronary arteries. Aorta normal caliber without aneurysm or dissection. Heart chambers normal size. No pericardial effusion. Pulmonary arteries adequately opacified and patent. No evidence of pulmonary embolism. Mediastinum/Nodes: Base of cervical region normal appearance. No thoracic adenopathy. Esophagus unremarkable. Lungs/Pleura: Mild dependent atelectasis in the posterior lungs. Lungs otherwise clear. Central peribronchial thickening. No segmental infiltrate, pleural effusion, or pneumothorax. Upper Abdomen: No upper abdominal abnormalities Musculoskeletal: Osseous structures unremarkable. Review of the MIP images confirms the above findings. IMPRESSION: No evidence of pulmonary embolism. Bronchitic changes with dependent atelectasis in the posterior lower lobes. Scattered atherosclerotic calcifications including coronary arteries. Aortic Atherosclerosis (ICD10-I70.0). Electronically Signed   By: Lavonia Dana M.D.   On: 08/14/2021 13:35    Procedures Procedures   Medications Ordered in ED Medications  lisinopril (ZESTRIL) tablet 40 mg (40 mg Oral Given 08/14/21 1032)  HYDROcodone-acetaminophen (NORCO/VICODIN) 5-325 MG per tablet 1 tablet (1 tablet Oral Given  08/14/21 1032)  morphine 4 MG/ML injection 4 mg (4 mg Intravenous Given 08/14/21 1229)  labetalol (NORMODYNE) injection 5 mg (5 mg Intravenous Given 08/14/21 1320)  iohexol (OMNIPAQUE) 350 MG/ML injection 100 mL (100 mLs Intravenous Contrast Given 08/14/21 1321)  HYDROmorphone (DILAUDID) injection 1 mg (1 mg Intravenous Given 08/14/21 1435)  LORazepam (ATIVAN) tablet 1 mg (1 mg Oral Given 08/14/21 1630)  LORazepam (ATIVAN) injection 1 mg (1 mg Intravenous Given 08/14/21 1741)    ED Course  I have reviewed the triage vital signs and the nursing notes.  Pertinent labs & imaging results that were available during my care of the patient were reviewed by me and considered in my medical decision making (see chart for details).    MDM Rules/Calculators/A&P                          51 year old female with medical history significant for hypertension, hyperlipidemia, prior ICH and right thalamic infarction who initially presented to the emergency  department with a chief complaint of headache and lightheadedness.  The patient has been out of her blood pressure medicine for a few weeks.  She previously had a hypertensive bleed in June 2022.  She presented overnight to the emergency department due to a new onset headache with associated hypertension and lightheadedness.  While waiting in the emergency department, the patient did develop right-sided chest discomfort, described as sharp, shooting, nonradiating, associated with mild shortness of breath, no nausea, vomiting or diaphoresis.  The patient stated that her headache resolved overnight while waiting in the emergency department.  She states that she has left-sided weakness at baseline from her prior strokes but since yesterday afternoon has had new right hemibody weakness.  No new numbness or facial droop.  On exam, the patient had 4-5 strength in the right hemibody which she states is new compared to prior.  She outside the window for tPA, code stroke  not called.  Initial CT head performed in triage performed which was negative for acute intracranial abnormality.  The patient had negative delta troponins, EKG with nonspecific T wave abnormalities but no ST segment changes and a CTA PE study that was negative for acute abnormalities with no evidence of acute PE or dissection. No PNA or PTX. She does have reproducible chest wall tenderness on exam suggesting a component of musculoskeletal chest pain.  She has a reported allergy to NSAIDs with hives.  Pain control attempted with IV opiates.  Will obtain MRI of the brain to evaluate for possible stroke in the setting of the patient's prior strokes, hypertension and new right-sided deficits.  The patient's BP improved following pain control, home medications and 5mg  IV Labelalol. She continued to endorse chest wall discomfort with reproducible tenderness to palpation on exam. Suspect chest pain etiology likely musculoskeletal in nature. Plan to follow-up MRI of the Brain given the patient hx of CVA and new right sided neurologic deficits. No nuchal rigidity, no fever or AMS, doubt meningitis/encephalitis. Signout given to Dr. Karle Starch at [phone].   Final Clinical Impression(s) / ED Diagnoses Final diagnoses:  Chest wall pain  Lightheadedness  Weakness  Hypertension, unspecified type    Rx / DC Orders ED Discharge Orders     None        Regan Lemming, MD 08/14/21 1831

## 2021-08-14 NOTE — ED Notes (Signed)
Per Tonia Ghent, pt refused additional troponin lab draw, left ambulatory through triage doors cursing.

## 2021-08-14 NOTE — H&P (Signed)
History and Physical    Roberta Calderon TGP:[id number] DOB: 12-13-1970 DOA: 08/13/2021  PCP: Patient, No Pcp Per (Inactive)   Patient coming from: Home  Chief Complaint: Chest pain, headache, dizziness, weakness of right side  HPI: Roberta Calderon is a 50 y.o. female with medical history significant for hypertension, hyperlipidemia, prior ICH and right thalamic infarction who presented to ER with complaint of headache and dizziness. During her initial workup she then complained of chest pain. She had a complete cardiac workup that was negative.  She had negative troponins x3 and a negative CT angiography of her chest.  She then started to complain of dizziness and weakness on her right side.  She then underwent an MRI with the plan of to discharge if it was negative but it came back positive for an acute right basal ganglia infarct.  She has a history of alcohol use, cocaine use and tobacco abuse.  She states she last drank alcohol and used cocaine 2 days ago.  She reports she has not been taking her medications for her high blood pressure or cholesterol for the last few months.  Not followed up with any physician to get refills of her medications.  She reports that her headache resolved while she was in the emergency room.  She stated that she felt her right arm and leg were very weak and it was difficult for her to walk.  She states she did not fall.  She denies any head trauma.  She denies any facial numbness or change in her vision. She smokes 1/2 to 1 pack of cigarettes a day.  Drinks alcohol most days of the week she states.  Uses cocaine regularly.  ED Course: On the emergency room she has maintained elevated blood pressure levels of 1 40-190/90-118. MRI was positive for right nasal ganglia CVA.  Troponins were negative x3 in the emergency room.  Glucose was elevated at 215.  Electrolytes and LFTs were normal.  CBC was unremarkable.  INR 1.0.  UDS is pending.  Neurology was consulted by ER  physician.  Hospitalist service asked to admit for further management  Review of Systems:  General: Denies fever, chills, weight loss, night sweats. Reports dizziness. Denies change in appetite HENT: Denies head trauma, headache, denies change in hearing, tinnitus.  Denies nasal congestion or bleeding.  Denies sore throat.  Denies difficulty swallowing Eyes: Denies blurry vision, pain in eye, drainage.  Denies discoloration of eyes. Neck: Denies pain.  Denies swelling.  Denies pain with movement. Cardiovascular: Reports chest pain. Denies palpitations.  Denies edema.  Denies orthopnea Respiratory: Denies shortness of breath, cough.  Denies wheezing.  Denies sputum production Gastrointestinal: Denies abdominal pain, swelling.  Denies nausea, vomiting, diarrhea.  Denies melena.  Denies hematemesis. Musculoskeletal: Reports limitation of movement right arm and leg.  Denies deformity or swelling. Denies arthralgias or myalgias. Genitourinary: Denies pelvic pain.  Denies urinary frequency or hesitancy.  Denies dysuria.  Skin: Denies rash.  Denies petechiae, purpura, ecchymosis. Neurological: Denies syncope. Denies seizure activity. Denies slurred speech, drooping face.  Denies visual change. Has weakness of right arm and leg Psychiatric: Denies depression, anxiety. Denies hallucinations.  Past Medical History:  Diagnosis Date   Hypertension     History reviewed. No pertinent surgical history.  Social History  reports that she has been smoking cigarettes. She has been smoking an average of .25 packs per day. She has never used smokeless tobacco. She reports current alcohol use. She reports that she does not  use drugs.  Allergies  Allergen Reactions   Nsaids Nausea And Vomiting and Rash   Aspirin Hives    No family history on file.   Prior to Admission medications   Medication Sig Start Date End Date Taking? Authorizing Provider  amLODipine (NORVASC) 10 MG tablet Take 1 tablet (10 mg  total) by mouth daily. Patient not taking: Reported on 08/14/2021 02/09/21 02/09/22  Einar Pheasant, NP  atorvastatin (LIPITOR) 80 MG tablet Take 1 tablet (80 mg total) by mouth daily. Patient not taking: Reported on 08/14/2021 02/09/21 02/09/22  Einar Pheasant, NP  lisinopril (ZESTRIL) 40 MG tablet Take 1 tablet (40 mg total) by mouth daily. Patient not taking: Reported on 08/14/2021 02/10/21   Einar Pheasant, NP    Physical Exam: Vitals:   08/14/21 1430 08/14/21 1500 08/14/21 1600 08/14/21 1610  BP: (!) 148/102 (!) 159/95 (!) 140/98 (!) 149/111  Pulse: 89 93  92  Resp: 13 11 (!) 23 14  Temp:    98.4 F (36.9 C)  TempSrc:    Oral  SpO2: 98% 95%  94%    Constitutional: NAD, calm, comfortable Vitals:   08/14/21 1430 08/14/21 1500 08/14/21 1600 08/14/21 1610  BP: (!) 148/102 (!) 159/95 (!) 140/98 (!) 149/111  Pulse: 89 93  92  Resp: 13 11 (!) 23 14  Temp:    98.4 F (36.9 C)  TempSrc:    Oral  SpO2: 98% 95%  94%   General: WDWN, Alert and oriented x3.  Eyes: EOMI, PERRL, conjunctivae normal.  Sclera nonicteric HENT:  [place]/AT, external ears normal.  Nares patent without epistasis.  Mucous membranes are moist. Posterior pharynx clear of any exudate.  Tongue with normal extension.  Neck: Soft, normal range of motion, supple, no masses, no thyromegaly.  Trachea midline Respiratory: clear to auscultation bilaterally, no wheezing, no crackles. Normal respiratory effort. No accessory muscle use.  Cardiovascular: Regular rate and rhythm, no murmurs / rubs / gallops. No extremity edema. 2+ pedal pulses. No carotid bruits.  Abdomen: Soft, no tenderness, nondistended, no rebound or guarding.  No masses palpated. Bowel sounds normoactive Musculoskeletal: FROM left arm and leg. Limitied movement of right arm due to weakness. no cyanosis. No joint deformity upper and lower extremities. Normal muscle tone.  Skin: Warm, dry, intact no rashes, lesions, ulcers. No induration Neurologic: CN 2-12  grossly intact. Normal speech. Sensation intact, patella DTR +2 on left, trace on right. Strength 5/5 in left upper and lower extremities. 3/5 in right arm and leg. Drift of right arm. Tongue without deviation with extension. No facial droop.   Psychiatric: Normal mood and flat affect. Poor insight into her medical conditions   Labs on Admission: I have personally reviewed following labs and imaging studies  CBC: Recent Labs  Lab 08/13/21 2307  WBC 6.3  NEUTROABS 2.9  HGB 14.3  HCT 46.4*  MCV 81.3  PLT 809    Basic Metabolic Panel: Recent Labs  Lab 08/13/21 2307  NA 137  K 3.7  CL 104  CO2 25  GLUCOSE 215*  BUN 12  CREATININE 0.77  CALCIUM 9.0    GFR: CrCl cannot be calculated (Unknown ideal weight.).  Liver Function Tests: Recent Labs  Lab 08/13/21 2307  AST 24  ALT 14  ALKPHOS 87  BILITOT 0.5  PROT 7.0  ALBUMIN 3.6    Urine analysis:    Component Value Date/Time   COLORURINE STRAW (A) 02/07/2021 [place] 02/07/2021 1014  LABSPEC 1.011 02/07/2021 1014   PHURINE 6.0 02/07/2021 1014   GLUCOSEU 50 (A) 02/07/2021 1014   HGBUR NEGATIVE 02/07/2021 [place] 02/07/2021 1014   KETONESUR NEGATIVE 02/07/2021 1014   PROTEINUR NEGATIVE 02/07/2021 1014   NITRITE NEGATIVE 02/07/2021 1014   LEUKOCYTESUR TRACE (A) 02/07/2021 1014    Radiological Exams on Admission: CT HEAD WO CONTRAST (5MM)  Result Date: 08/13/2021 CLINICAL DATA:  Dizziness, nonspecific EXAM: CT HEAD WITHOUT CONTRAST TECHNIQUE: Contiguous axial images were obtained from the base of the skull through the vertex without intravenous contrast. COMPARISON:  02/07/2021 FINDINGS: Brain: No evidence of acute infarction, hemorrhage, cerebral edema, mass, mass effect, or midline shift. No hydrocephalus or extra-axial fluid collection. Previously noted hyperdense material in the left pons is no longer seen, with mild hypodensity there, likely sequela of prior hypertensive  microhemorrhage. Redemonstrated lacunar infarcts in the bilateral thalami and basal ganglia. Periventricular white matter changes, likely the sequela of chronic small vessel ischemic disease. Vascular: No hyperdense vessel. Skull: Normal. Negative for fracture or focal lesion. Sinuses/Orbits: Mild mucosal thickening in the posterior right ethmoid air cells. The orbits are unremarkable. Other: The mastoid air cells are well aerated. IMPRESSION: IMPRESSION No acute intracranial process. Electronically Signed   By: Merilyn Baba M.D.   On: 08/13/2021 23:23   CT Angio Chest PE W and/or Wo Contrast  Result Date: 08/14/2021 CLINICAL DATA:  Dizziness, hypertensive, high clinical suspicion of pulmonary embolism EXAM: CT ANGIOGRAPHY CHEST WITH CONTRAST TECHNIQUE: Multidetector CT imaging of the chest was performed using the standard protocol during bolus administration of intravenous contrast. Multiplanar CT image reconstructions and MIPs were obtained to evaluate the vascular anatomy. CONTRAST:  16mL OMNIPAQUE IOHEXOL 350 MG/ML SOLN IV COMPARISON:  None FINDINGS: Cardiovascular: Atherosclerotic calcifications aorta, proximal great vessels, and coronary arteries. Aorta normal caliber without aneurysm or dissection. Heart chambers normal size. No pericardial effusion. Pulmonary arteries adequately opacified and patent. No evidence of pulmonary embolism. Mediastinum/Nodes: Base of cervical region normal appearance. No thoracic adenopathy. Esophagus unremarkable. Lungs/Pleura: Mild dependent atelectasis in the posterior lungs. Lungs otherwise clear. Central peribronchial thickening. No segmental infiltrate, pleural effusion, or pneumothorax. Upper Abdomen: No upper abdominal abnormalities Musculoskeletal: Osseous structures unremarkable. Review of the MIP images confirms the above findings. IMPRESSION: No evidence of pulmonary embolism. Bronchitic changes with dependent atelectasis in the posterior lower lobes. Scattered  atherosclerotic calcifications including coronary arteries. Aortic Atherosclerosis (ICD10-I70.0). Electronically Signed   By: Lavonia Dana M.D.   On: 08/14/2021 13:35   MR BRAIN WO CONTRAST  Result Date: 08/14/2021 CLINICAL DATA:  Provided history: Neuro deficit, acute, stroke suspected. EXAM: MRI HEAD WITHOUT CONTRAST TECHNIQUE: Multiplanar, multiecho pulse sequences of the brain and surrounding structures were obtained without intravenous contrast. COMPARISON:  Noncontrast head CT 08/13/2021. CT angiogram head/neck 02/07/2021. And MRI 04/08/2020 FINDINGS: Brain: Mild-to-moderate intermittent motion degradation. Cerebral volume is normal for age. 4 mm focus of restricted diffusion within the right lentiform nucleus, compatible with acute/subacute infarct. Multiple chronic lacunar infarcts within the bilateral basal ganglia/internal capsule and thalami, as well as pons. Background moderate multifocal T2 FLAIR hyperintense signal abnormality within the cerebral white matter and pons, nonspecific but compatible with chronic small vessel ischemic disease. Chronic small vessel ischemic changes are also present within the right middle cerebellar peduncle. Small chronic infarct within the left cerebellar hemisphere. Fairly numerous supratentorial and infratentorial chronic parenchymal microhemorrhages with a central and posterior fossa were dominance, likely reflecting sequela of hypertensive microangiopathy. No evidence of an intracranial mass No extra-axial  fluid collection. No midline shift. Vascular: Maintained flow voids within the proximal large arterial vessels. Skull and upper cervical spine: No focal suspicious marrow lesion. Incompletely assessed cervical spondylosis. Sinuses/Orbits: Visualized orbits show no acute finding. Mild mucosal thickening within the paranasal sinuses, greatest within the posterior right ethmoid air cells and left maxillary sinus. IMPRESSION: Motion degraded exam. 4 mm acute/early  subacute lacunar infarct within the right basal ganglia. Multiple chronic lacunar infarcts within the bilateral basal ganglia/internal capsules and thalami, as well as pons. Background moderate chronic small vessel ischemic changes within the cerebral white matter and pons. Chronic small-vessel ischemic changes are also present within the right middle cerebellar peduncle. Small chronic infarct within the left cerebellar hemisphere. Fairly numerous supratentorial and infratentorial chronic parenchymal microhemorrhages, in a distribution suggesting sequela of chronic hypertensive microangiopathy. Mild paranasal sinus disease, as described. Electronically Signed   By: Kellie Simmering D.O.   On: 08/14/2021 18:46    EKG: Independently reviewed.  EKG shows normal sinus rhythm.  No acute ST elevation or depression.  Right atrial enlargement noted.  QTc prolonged at 512  Assessment/Plan Principal Problem:   Cerebrovascular accident (CVA) of right basal ganglia  Ms. Fleeger is admitted to medical telemetry floor for CVA of right basal ganglia.  Will evaluate carotids for large vessel occlusion/stenosis. CTA head in am to evaluate for LVO Obtain echocardiogram to evaluate for PFO, wall motion and ejection fraction. Hypertension of 220/110 will be allowed for 24 hours per stroke protocol.  After which blood pressure will be slowly reduced to goal level. Pt is almost to 24 hour period and will resume antihypertensives in am with norvasc and Cozaar.  Importance of compliance with medications to control BP and lower her risk of future CV, CAD, MI, premature death discussed with her. Antiplatelet therapy with plavix daily. Has listed allergy to aspirin of hives. Resume statin therapy.  Check lipid panel.  Neurochecks per stroke protocol  Active Problems:   Hypertensive urgency Resume antihypertensives as above.    Metabolic syndrome Check [id number]. Previous levels were 6.3 putting her in dysmetabolic syndrome  range and at risk of developing diabetes mellitus.     Right sided weakness Secondary to CVA Consult PT/OT for evaluation    Cocaine abuse  Pt last used two days ago. Will need Social work/DC planning to assess to help with rehab program options.     Alcohol abuse Last drank alcohol 2 days ago.  She reports that she drinks most days of the week.  She states she has had tremors and withdrawal symptoms if she does not drink for a few days in the past.  She is placed on CIWA protocol    Prolonged QT interval Avoid medications which could further prolong QT interval    Tobacco use disorder Nicotine patch provided to lessen withdrawls   DVT prophylaxis: SCDs for DVT prophylaxis.   Code Status:   Full Code  Family Communication:  Diagnosis and plan discussed with patient.  She verbalized understanding agrees with plan.  Further recommendations to follow as clinical indicated Disposition Plan:   Patient is from:  Home  Anticipated DC to:  Home versus rehab, to be determined  Anticipated DC date:  Anticipate 2 midnight or more stay in the hospital  Time spent on admission-75 minutes Consults called:  Neurology consulted by Er physician  Admission status:  Inpatient  Yevonne Aline Lien Lyman MD [facility]  How to contact the [facility] Attending or Consulting provider [place] or covering  provider during after hours [place], for this patient?   Check the care team in [facility] and look for a) attending/consulting [facility] provider listed and b) the [facility] team listed Log into [email or web address] and use 's universal password to access. If you do not have the password, please contact the hospital operator. Locate the [facility] provider you are looking for under [facility] and page to a number that you can be directly reached. If you still have difficulty reaching the provider, please page the [facility] (Director on Call) for the Hospitalists listed on amion for assistance.  08/14/2021, 7:55 PM

## 2021-08-14 NOTE — ED Provider Notes (Signed)
Care of the patient assumed at sign out. Complex history of HTN, microhemorrhage in June 2022, substance abuse and medication non compliance, originally here for MSK chest pain,R shoulder pain but began complaining of R sided weakness. Inconsistent exam with prior shift. Awaiting MRI.  Physical Exam  BP (!) 149/111 (BP Location: Left Arm)    Pulse 92    Temp 98.4 F (36.9 C) (Oral)    Resp 14    SpO2 94%   Physical Exam No R sided weakness on follow up exam, complaining of pain in R shoulder which limits her movement.  ED Course/Procedures     Procedures  MDM  MRI results reviewed, motion degraded, required multiple doses of ativan, but per Radiologist there is an acute/subacute lacunar infarct in R basal ganglia. This would not correlated with her reported symptoms of R sided weakness, will discuss with Neurology.   Spoke with Dr. Lorrin Goodell, Neurology, who recommends medicine admission.   Spoke with Dr. Tonie Griffith, Hospitalist, who will admit.        Truddie Hidden, MD 08/14/21 [phone]

## 2021-08-14 NOTE — Consult Note (Signed)
NEUROLOGY CONSULTATION NOTE   Date of service: August 14, 2021 Patient Name: Roberta Calderon MRN:  [id number] DOB:  [birth year] Reason for consult: "R BG stroke" Requesting Provider: Truddie Hidden, MD _ _ _   _ __   _ __ _ _  __ __   _ __   __ _  History of Present Illness  Roberta Calderon is a 50 y.o. female with PMH significant for poorly controlled HTN, strokes, lacunar strokes, and prior ?left pontine bleed vs mineralization in June 2022 (was unable to characterize as she could not tolerate MRI Brain).  She presented to the ED with headache + lightheadedness which resolved while waiting, reported R sided chest pain while waiting for which is being worked up. She has mild L sided weakness at baseline since June 2022 from prior L pontine hemorrhage vs lacunar strokes. She also reported new right hemibody weakness for which MRI Brain was obtained and demonstrated 35mm acute/early subacute Right Basal Ganglia stroke.  On my evaluation, she wants to sleep. She is tired of talking to people about the same symptoms and declined history and instead asked me to read it from the chart. Most of the history obtained from chart review.  Patient also declined a significant part of physical exam and was upset with my attempt to get an exam.  mRS: 0 tNKase/thrombectomy: not offered, stroke is likely incidental and not causing symptoms. NIHSS components Score: Comment  1a Level of Conscious 0[]  1[x]  2[]  3[]      1b LOC Questions 0[x]  1[]  2[]       1c LOC Commands 0[x]  1[]  2[]       2 Best Gaze 0[x]  1[]  2[]       3 Visual 0[x]  1[]  2[]  3[]      4 Facial Palsy 0[x]  1[]  2[]  3[]      5a Motor Arm - left 0[x]  1[]  2[]  3[]  4[]  UN[]    5b Motor Arm - Right 0[x]  1[]  2[]  3[]  4[]  UN[]    6a Motor Leg - Left 0[x]  1[]  2[]  3[]  4[]  UN[]    6b Motor Leg - Right 0[x]  1[]  2[]  3[]  4[]  UN[]    7 Limb Ataxia 0[x]  1[]  2[]  3[]  UN[]     8 Sensory 0[x]  1[]  2[]  UN[]      9 Best Language 0[x]  1[]  2[]  3[]      10 Dysarthria  0[x]  1[]  2[]  UN[]      11 Extinct. and Inattention 0[x]  1[]  2[]       TOTAL: 1     ROS   Unable to obtain as patient declined much of the history.  Past History   Past Medical History:  Diagnosis Date   Hypertension    History reviewed. No pertinent surgical history. No family history on file. Social History   Socioeconomic History   Marital status: Single    Spouse name: Not on file   Number of children: Not on file   Years of education: Not on file   Highest education level: Not on file  Occupational History   Not on file  Tobacco Use   Smoking status: Every Day    Packs/day: 0.25    Types: Cigarettes   Smokeless tobacco: Never  Substance and Sexual Activity   Alcohol use: Yes   Drug use: No   Sexual activity: Not on file  Other Topics Concern   Not on file  Social History Narrative   Not on file   Social Determinants of Health   Financial Resource Strain: Not on file  Food Insecurity: Not on file  Transportation Needs: Not on file  Physical Activity: Not on file  Stress: Not on file  Social Connections: Not on file   Allergies  Allergen Reactions   Nsaids Nausea And Vomiting and Rash   Aspirin Hives    Medications  (Not in a hospital admission)    Vitals   Vitals:   08/14/21 1430 08/14/21 1500 08/14/21 1600 08/14/21 1610  BP: (!) 148/102 (!) 159/95 (!) 140/98 (!) 149/111  Pulse: 89 93  92  Resp: 13 11 (!) 23 14  Temp:    98.4 F (36.9 C)  TempSrc:    Oral  SpO2: 98% 95%  94%     There is no height or weight on file to calculate BMI.  Physical Exam   General: Laying comfortably in bed; in no acute distress.  HENT: Normal oropharynx and mucosa. Normal external appearance of ears and nose.  Neck: Supple, no pain or tenderness  CV: No JVD. No peripheral edema.  Pulmonary: Symmetric Chest rise. Normal respiratory effort.  Abdomen: Soft to touch, non-tender.  Ext: No cyanosis, edema, or deformity  Skin: No rash. Normal  palpation of skin.   Musculoskeletal: Normal digits and nails by inspection. No clubbing.   Neurologic Examination  Mental status/Cognition: somnolent, oriented to self, place, month and year, poor attention.  Speech/language: Fluent, comprehension intact, object naming intact, repetition intact. Cranial nerves:   CN II Pupils equal and reactive to light, no VF deficits   CN III,IV,VI EOM intact, no gaze preference or deviation, no nystagmus   CN V normal sensation in V1, V2, and V3 segments bilaterally   CN VII no asymmetry, no nasolabial fold flattening   CN VIII normal hearing to speech   CN IX & X normal palatal elevation, no uvular deviation   CN XI 5/5 head turn and 5/5 shoulder shrug bilaterally   CN XII midline tongue protrusion   Motor:  Muscle bulk: normal, tone normal. Patient declined detailed motor strength testing. Noted to be moving all extremities and keeps boths arms off the bed for more than 10 secs and hold her legs off the bed for more than 5 secs.  Reflexes: Unable to obtain as patient declined  Sensation: Unable to obtain as patient declined  Coordination/Complex Motor:  - Finger to Nose intact BL - Heel to shin Unable to obtain as patient declined - Rapid alternating movement Unable to obtain as patient declined - Gait: Unable to obtain as patient declined  Labs   CBC:  Recent Labs  Lab 08/13/21 2307  WBC 6.3  NEUTROABS 2.9  HGB 14.3  HCT 46.4*  MCV 81.3  PLT 914    Basic Metabolic Panel:  Lab Results  Component Value Date   NA 137 08/13/2021   K 3.7 08/13/2021   CO2 25 08/13/2021   GLUCOSE 215 (H) 08/13/2021   BUN 12 08/13/2021   CREATININE 0.77 08/13/2021   CALCIUM 9.0 08/13/2021   GFRNONAA >60 08/13/2021   GFRAA >60 04/11/2020   Lipid Panel:  Lab Results  Component Value Date   LDLCALC 142 (H) 02/06/2021   HgbA1c:  Lab Results  Component Value Date   HGBA1C 6.3 (H) 02/06/2021   Urine Drug Screen:     Component Value  Date/Time   LABOPIA NONE DETECTED 02/07/2021 1014   COCAINSCRNUR POSITIVE (A) 02/07/2021 1014   LABBENZ NONE DETECTED 02/07/2021 1014   AMPHETMU NONE DETECTED 02/07/2021 1014   [place] DETECTED 02/07/2021 1014  LABBARB NONE DETECTED 02/07/2021 1014    Alcohol Level     Component Value Date/Time   ETH <10 08/13/2021 2308    CT Head without contrast(Personally reviewed): CTH was negative for a large hypodensity concerning for a large territory infarct or hyperdensity concerning for an [place]  CT angio Head and Neck with contrast: pending  MRI Brain(Personally reviewed): Motion degraded exam.   4 mm acute/early subacute lacunar infarct within the right basal ganglia.   Multiple chronic lacunar infarcts within the bilateral basal ganglia/internal capsules and thalami, as well as pons.   Background moderate chronic small vessel ischemic changes within the cerebral white matter and pons.   Chronic small-vessel ischemic changes are also present within the right middle cerebellar peduncle.   Small chronic infarct within the left cerebellar hemisphere.   Fairly numerous supratentorial and infratentorial chronic parenchymal microhemorrhages, in a distribution suggesting sequela of chronic hypertensive microangiopathy.   Mild paranasal sinus disease, as described.   Impression   Roberta Calderon is a 51 y.o. female with PMH significant for poorly controlled HTN, strokes, lacunar strokes, and prior ?left pontine bleed vs mineralization in June 2022 (was unable to characterize as she could not tolerate MRI Brain).  She presented to the ED with headache + lightheadedness which resolved while waiting, reported R sided chest pain while waiting for which is being worked up. She has mild L sided weakness at baseline since June 2022 from prior L pontine hemorrhage vs lacunar strokes. She also reported new right hemibody weakness for which MRI Brain was obtained and demonstrated 42mm  acute/early subacute Right Basal Ganglia stroke.  Declined to participate with full neuro exam but no focal deficit on the limited exam she agreed to participate with.  Primary Diagnosis:  Other cerebral infarction due to occlusion of stenosis of small artery.  Secondary Diagnosis: Essential (primary) hypertension  Recommendations  Plan:  - Frequent Neuro checks per stroke unit protocol - CTA head and neck is pending. - Recommend obtaining TTE - Recommend obtaining Lipid panel with LDL - Please start statin if LDL > 70 - Recommend HbA1c - Antithrombotic - Aspirin 81mg  daily. - Recommend DVT ppx - SBP goal - aim for gradual normotension. - Recommend Telemetry monitoring for arrythmia - Recommend bedside swallow screen prior to PO intake. - Stroke education booklet - Recommend PT/OT/SLP consult - Recommend Urine Tox screen.  ______________________________________________________________________   Thank you for the opportunity to take part in the care of this patient. If you have any further questions, please contact the neurology consultation attending.  Signed,  [place] Pager Number [phone] _ _ _   _ __   _ __ _ _  __ __   _ __   __ _

## 2021-08-15 ENCOUNTER — Inpatient Hospital Stay (HOSPITAL_COMMUNITY): Payer: Self-pay

## 2021-08-15 DIAGNOSIS — I6381 Other cerebral infarction due to occlusion or stenosis of small artery: Secondary | ICD-10-CM

## 2021-08-15 DIAGNOSIS — F17200 Nicotine dependence, unspecified, uncomplicated: Secondary | ICD-10-CM

## 2021-08-15 DIAGNOSIS — I6389 Other cerebral infarction: Secondary | ICD-10-CM

## 2021-08-15 DIAGNOSIS — I160 Hypertensive urgency: Secondary | ICD-10-CM

## 2021-08-15 DIAGNOSIS — F1010 Alcohol abuse, uncomplicated: Secondary | ICD-10-CM

## 2021-08-15 DIAGNOSIS — F1410 Cocaine abuse, uncomplicated: Secondary | ICD-10-CM

## 2021-08-15 LAB — HEMOGLOBIN A1C
Hgb A1c MFr Bld: 6.6 % — ABNORMAL HIGH (ref 4.8–5.6)
Mean Plasma Glucose: 142.72 mg/dL

## 2021-08-15 LAB — LIPID PANEL
Cholesterol: 192 mg/dL (ref 0–200)
HDL: 41 mg/dL (ref >40)
LDL Cholesterol: 133 mg/dL — ABNORMAL HIGH (ref 0–99)
Total CHOL/HDL Ratio: 4.7 RATIO
Triglycerides: 92 mg/dL (ref <150)
VLDL: 18 mg/dL (ref 0–40)

## 2021-08-15 LAB — RAPID URINE DRUG SCREEN, HOSP PERFORMED
Amphetamines: NOT DETECTED
Barbiturates: NOT DETECTED
Benzodiazepines: POSITIVE — AB
Cocaine: POSITIVE — AB
Opiates: POSITIVE — AB
Tetrahydrocannabinol: NOT DETECTED

## 2021-08-15 LAB — GLUCOSE, CAPILLARY: Glucose-Capillary: 112 mg/dL — ABNORMAL HIGH (ref 70–99)

## 2021-08-15 LAB — ECHOCARDIOGRAM COMPLETE
Area-P 1/2: 1.68 cm2
S' Lateral: 1.9 cm
Weight: 1978.85 oz

## 2021-08-15 IMAGING — CT CT ANGIO HEAD
4 of 15 series · 17 of 47 positions shown · IV contrast (omnipaque)
Comparison: CT head [DATE], [DATE].  MRI head [DATE]
COMPARISON: CT head [DATE], [DATE].  MRI head [DATE]

Addendum:
CLINICAL DATA: Follow-up stroke

EXAM:
CT ANGIOGRAPHY HEAD
TECHNIQUE: Multidetector CT imaging of the head was performed using the
standard protocol during bolus administration of intravenous
contrast. Multiplanar CT image reconstructions and MIPs were
obtained to evaluate the vascular anatomy.
CONTRAST:  75mL OMNIPAQUE IOHEXOL 350 MG/ML SOLN

[Series 7: headangio 2.0 hr36 3 · axial · 0.40mm/px · z∈[-70,+14]mm · 3 of 85 slices shown]
[im 22/85  brain]
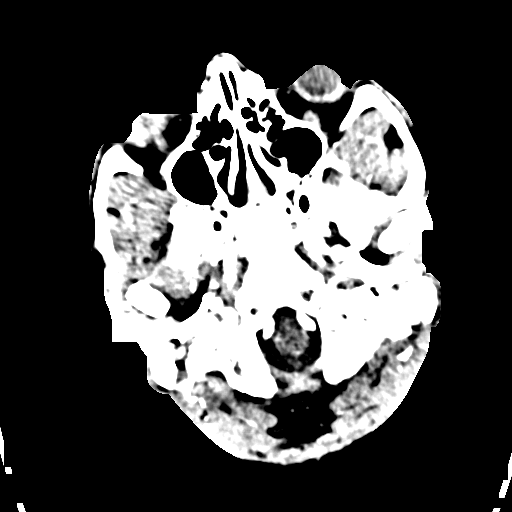
[im 43/85  brain]
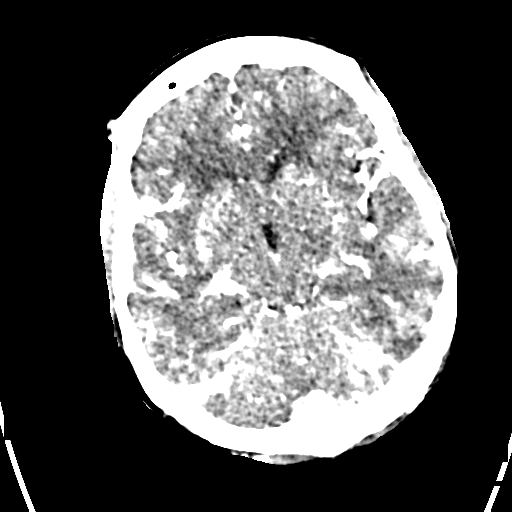
[im 64/85  brain]
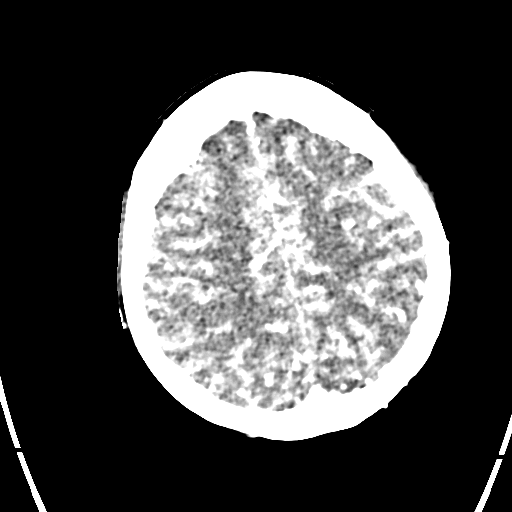

[Series 9: head bone · axial · 0.39mm/px · z∈[-64,-28]mm · 2 of 91 slices shown]
[im 19/91  bone]
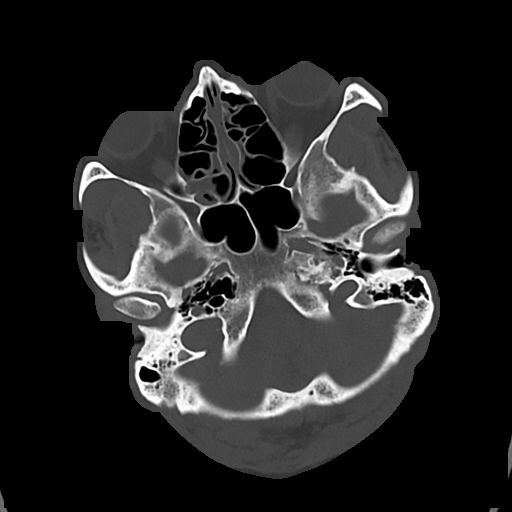
[im 37/91  bone]
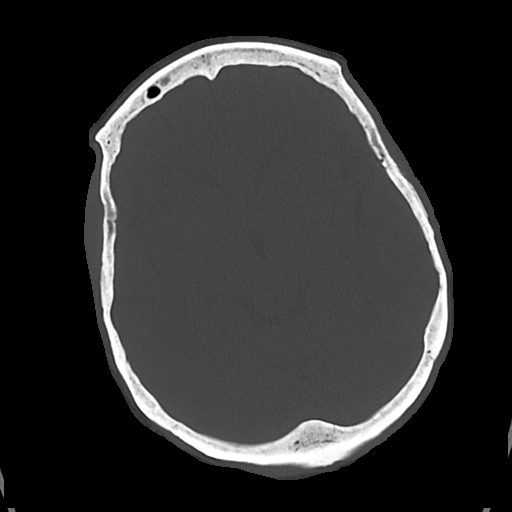

[Series 13: headangio 1.0 mpr ax · axial · 0.39mm/px · z∈[-92,+39]mm · 9 of 165 slices shown]
[im 17/165  brain]
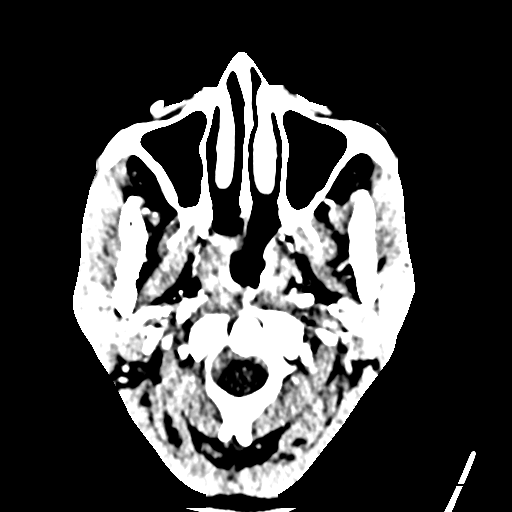
[im 33/165  bone]
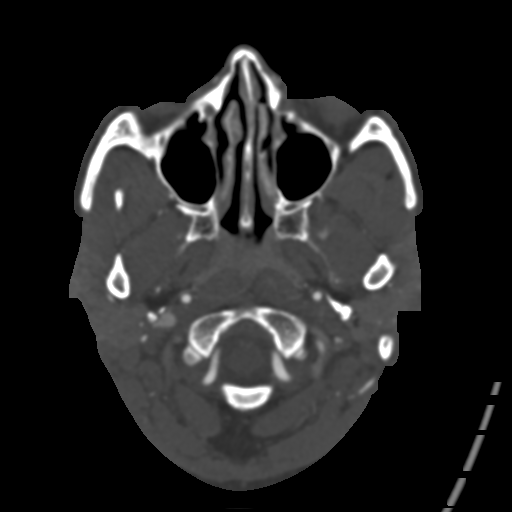
[im 50/165  brain]
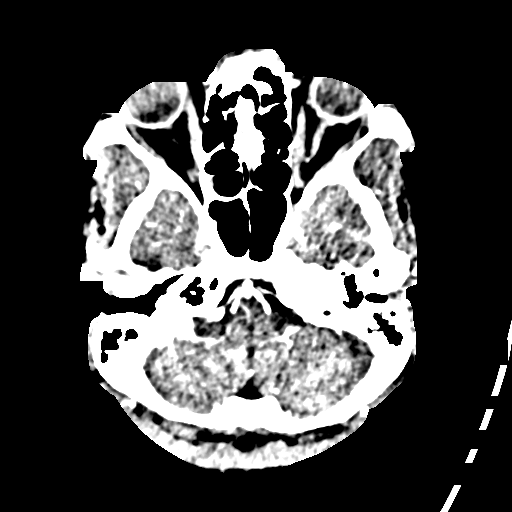
[im 66/165  bone]
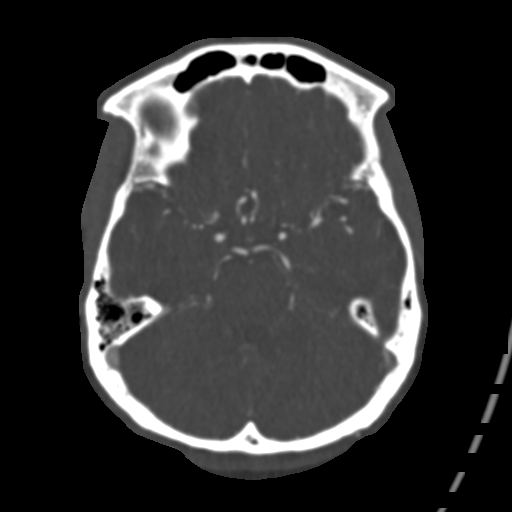
[im 83/165  brain]
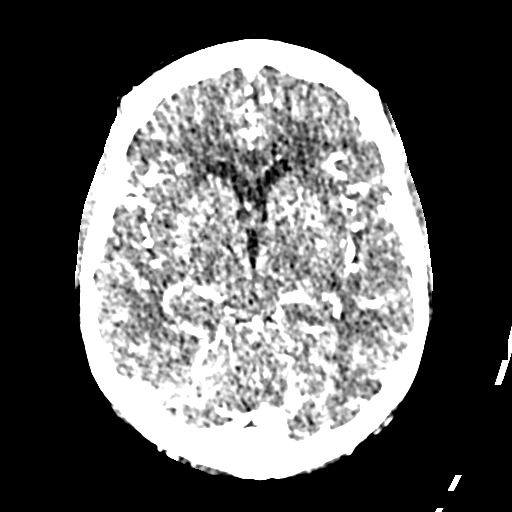
[im 99/165  bone]
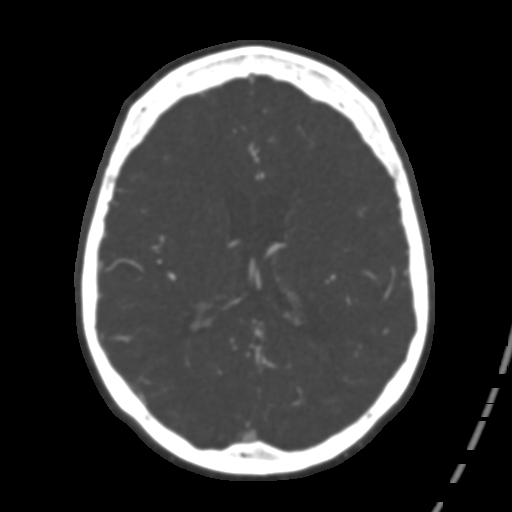
[im 115/165  brain]
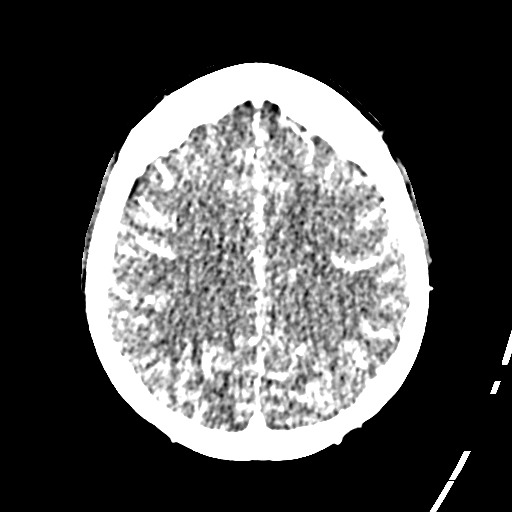
[im 132/165  bone]
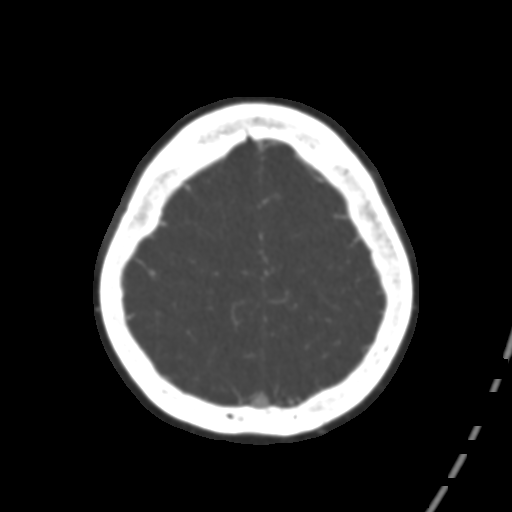
[im 148/165  brain]
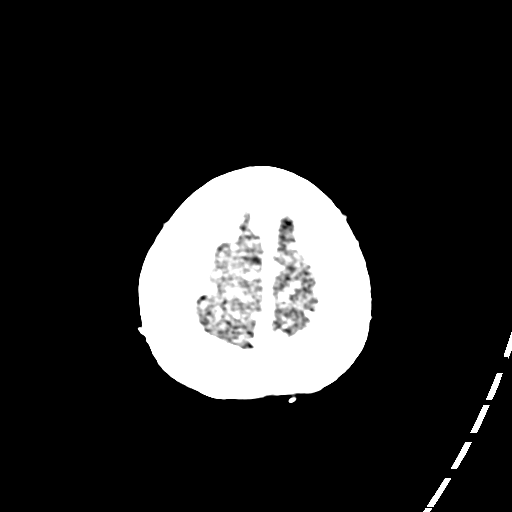

[Series 14: headangio 1.0 mpr cor · coronal · 0.37mm/px · 3 of 201 slices shown]
[im 67/201  brain]
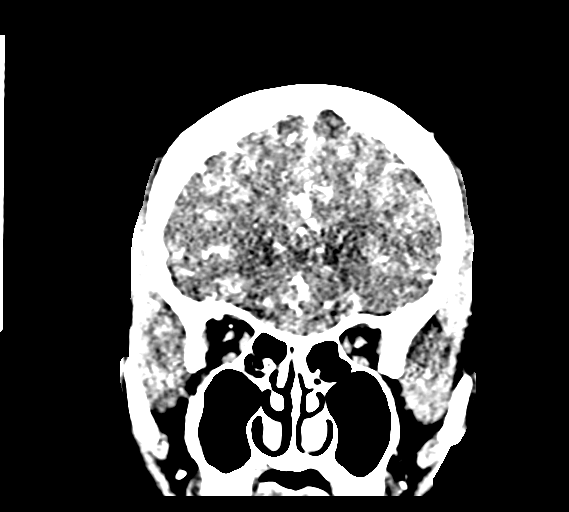
[im 101/201  brain]
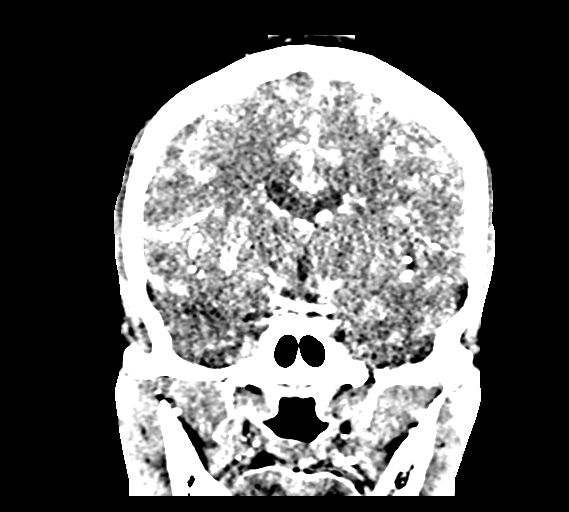
[im 134/201  brain]
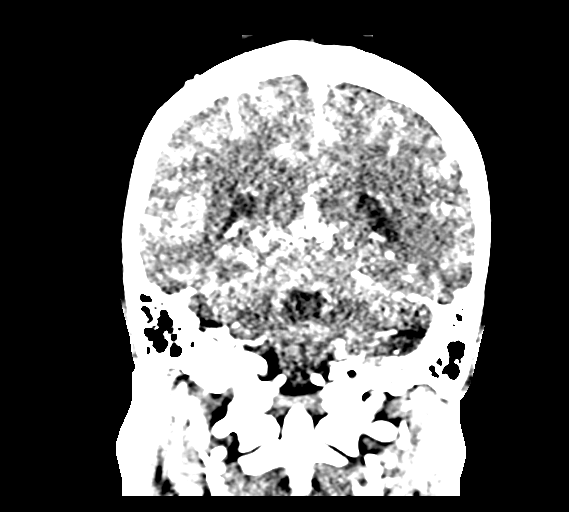

[17 of 47 positions shown; findings below may reference images not displayed]

FINDINGS: CT HEAD

Brain: 5 mm hyperdensity just above the left sylvian fissure most
compatible with recent hemorrhage. This was present on the CT 2 days
ago but was not present on the CT from [DATE]. This shows
susceptibility on MRI yesterday. No other areas of acute hemorrhage.

Moderate chronic microvascular ischemic change in the white matter,
basal ganglia and thalamus bilaterally and in the pons. Negative for
mass lesion. Ventricle size normal.

Vascular: Negative for hyperdense vessel

Skull: Negative

Sinuses: Mild mucosal edema paranasal sinuses.  Negative orbit

Other: None

CTA HEAD

Anterior circulation: Internal carotid artery widely patent through
the cavernous segment. Anterior and middle cerebral arteries normal
bilaterally. No stenosis, occlusion, or aneurysm.

Posterior circulation: Both vertebral arteries widely patent to the
basilar. AICA patent bilaterally. Basilar widely patent. Superior
cerebellar and posterior cerebral arteries normal bilaterally.

Venous sinuses: Normal venous enhancement

Anatomic variants: None

Review of the MIP images confirms the above findings.
IMPRESSION: 1. CT head demonstrates a 5 mm area of hyperdensity just above the
left sylvian fissure most compatible with acute hemorrhage. This is
unchanged from 2 days prior.
2. No acute ischemic infarct. Moderate to extensive chronic
microvascular ischemia.
3. Normal CTA head

ADDENDUM:
These results were called by telephone at the time of interpretation
on [DATE] at [DATE] to provider [REDACTED] , who verbally
acknowledged these results.

*** End of Addendum ***
FINDINGS: CT HEAD

Brain: 5 mm hyperdensity just above the left sylvian fissure most
compatible with recent hemorrhage. This was present on the CT 2 days
ago but was not present on the CT from [DATE]. This shows
susceptibility on MRI yesterday. No other areas of acute hemorrhage.

Moderate chronic microvascular ischemic change in the white matter,
basal ganglia and thalamus bilaterally and in the pons. Negative for
mass lesion. Ventricle size normal.

Vascular: Negative for hyperdense vessel

Skull: Negative

Sinuses: Mild mucosal edema paranasal sinuses.  Negative orbit

Other: None

CTA HEAD

Anterior circulation: Internal carotid artery widely patent through
the cavernous segment. Anterior and middle cerebral arteries normal
bilaterally. No stenosis, occlusion, or aneurysm.

Posterior circulation: Both vertebral arteries widely patent to the
basilar. AICA patent bilaterally. Basilar widely patent. Superior
cerebellar and posterior cerebral arteries normal bilaterally.

Venous sinuses: Normal venous enhancement

Anatomic variants: None

Review of the MIP images confirms the above findings.
IMPRESSION: 1. CT head demonstrates a 5 mm area of hyperdensity just above the
left sylvian fissure most compatible with acute hemorrhage. This is
unchanged from 2 days prior.
2. No acute ischemic infarct. Moderate to extensive chronic
microvascular ischemia.
3. Normal CTA head

## 2021-08-15 MED ORDER — INSULIN ASPART 100 UNIT/ML IJ SOLN
0.0000 [IU] | Freq: Three times a day (TID) | INTRAMUSCULAR | Status: DC
Start: 2021-08-16 — End: 2021-08-18
  Administered 2021-08-16: 18:00:00 2 [IU] via SUBCUTANEOUS
  Administered 2021-08-16: 13:00:00 3 [IU] via SUBCUTANEOUS
  Administered 2021-08-17 (×2): 1 [IU] via SUBCUTANEOUS

## 2021-08-15 MED ORDER — HYDRALAZINE HCL 20 MG/ML IJ SOLN: 10.0000 mg | Freq: Four times a day (QID) | INTRAMUSCULAR | Status: DC | PRN

## 2021-08-15 MED ORDER — IOHEXOL 350 MG/ML SOLN
75.0000 mL | Freq: Once | INTRAVENOUS | Status: AC | PRN
  Administered 2021-08-15: 12:00:00 75 mL via INTRAVENOUS

## 2021-08-15 NOTE — Progress Notes (Signed)
Carotid duplex has been completed.   Preliminary results in CV Proc.   Jinny Blossom Agastya Meister 08/15/2021 10:42 AM

## 2021-08-15 NOTE — Progress Notes (Signed)
Pt wants to sleep; RN to place new consult when pt is ready for PIV assessment/placement

## 2021-08-15 NOTE — Progress Notes (Signed)
°  Echocardiogram 2D Echocardiogram has been performed.  Darlina Sicilian M 08/15/2021, 9:43 AM

## 2021-08-15 NOTE — Progress Notes (Signed)
SLP Cancellation Note  Patient Details Name: Roberta Calderon MRN: [id number] DOB: [birth year]   Cancelled treatment:       Reason Eval/Treat Not Completed: Fatigue/lethargy limiting ability to participate. RN reports dose of ativan given about an hour ago. Despite attempts, pt unable to maintain adequate alertness for completion of speech-language eval. Will f/u.    [place] Dense, [place], [place] [facility] Office Number: [phone]  Acie Fredrickson 08/15/2021, 12:26 PM

## 2021-08-15 NOTE — TOC Initial Note (Addendum)
Transition of Care [facility]) - Initial/Assessment Note    Patient Details  Name: Roberta Calderon MRN: [id number] Date of Birth: [birth year]  [facility] CM/SW Contact:    Pollie Friar, RN Phone Number: 08/15/2021, 3:21 PM  Clinical Narrative:                 Patient is from home with her mother. She has been previously arranged with [facility] for PCP and never went to the appointment. Pt is able to use [facility] for her pharmacy needs as long as she is active with [facility]. Information is on the AVS.  Pt with recommendations for home health services. CM will see if the [place] service for this week with accept.  [place] and 3 in 1 for home ordered through [facility] and will be delivered to the room.  Medications for D/c will need to be sent to either [place] or [facility] pharmacy so pt can receive assistance with her meds.  TOC following.  1545: pt denied for charity home health services due to her current substance abuse  Expected Discharge Plan: Home/Self Care Barriers to Discharge: Continued Medical Work up, Inadequate or no insurance   Patient Goals and CMS Choice        Expected Discharge Plan and Services Expected Discharge Plan: Home/Self Care   Discharge Planning Services: CM Consult Post Acute Care Choice: Durable Medical Equipment Living arrangements for the past 2 months: Single Family Home                                      Prior Living Arrangements/Services Living arrangements for the past 2 months: Single Family Home Lives with:: Parents Patient language and need for interpreter reviewed:: (P) Yes Do you feel safe going back to the place where you live?: (P) Yes            Criminal Activity/Legal Involvement Pertinent to Current Situation/Hospitalization: (P) No - Comment as needed  Activities of Daily Living      Permission Sought/Granted                  Emotional Assessment Appearance:: (P) Appears  stated age     Orientation: : (P) Oriented to Self, Oriented to Place, Oriented to  Time, Oriented to Situation Alcohol / Substance Use: (P) Alcohol Use, Illicit Drugs Psych Involvement: (P) No (comment)  Admission diagnosis:  Lightheadedness [R42] Chest wall pain [R07.89] Weakness [R53.1] Hypertension, unspecified type [I10] Cerebrovascular accident (CVA) of right basal ganglia (HCC) [I63.81] Patient Active Problem List   Diagnosis Date Noted   Cerebrovascular accident (CVA) of right basal ganglia ([place]) 08/14/2021   Hypertensive urgency 08/14/2021   Cocaine abuse ([place]) 46/50/3546   Metabolic syndrome 56/81/2751   Right sided weakness 08/14/2021   Alcohol abuse 08/14/2021   Prolonged QT interval 08/14/2021   ICH (intracerebral hemorrhage) ([place]) 02/07/2021   Dyslipidemia    Right thalamic infarction ([place]) 04/08/2020   Prediabetes    Polysubstance abuse ([place])    Hemiparesis affecting left side as late effect of stroke ([place])    Ischemic stroke ([place]) 04/06/2020   Hypertension 04/06/2020   Tobacco use disorder 04/06/2020   PCP:  Patient, No Pcp Per (Inactive) Pharmacy:   [facility] [address] [place] [address] Phone: [phone] Fax: [phone]     Social Determinants of Health (SDOH) Interventions  Readmission Risk Interventions No flowsheet data found.

## 2021-08-15 NOTE — Evaluation (Signed)
Speech Language Pathology Evaluation Patient Details Name: Roberta Calderon MRN: [id number] DOB: 12/02/1970 Today's Date: 08/15/2021 Time: 4166-0630 SLP Time Calculation (min) (ACUTE ONLY): 21 min  Problem List:  Patient Active Problem List   Diagnosis Date Noted   Cerebrovascular accident (CVA) of right basal ganglia ([place]) 08/14/2021   Hypertensive urgency 08/14/2021   Cocaine abuse ([place]) 16/08/930   Metabolic syndrome 35/57/3220   Right sided weakness 08/14/2021   Alcohol abuse 08/14/2021   Prolonged QT interval 08/14/2021   ICH (intracerebral hemorrhage) ([place]) 02/07/2021   Dyslipidemia    Right thalamic infarction ([place]) 04/08/2020   Prediabetes    Polysubstance abuse ([place])    Hemiparesis affecting left side as late effect of stroke ([place])    Ischemic stroke ([place]) 04/06/2020   Hypertension 04/06/2020   Tobacco use disorder 04/06/2020   Past Medical History:  Past Medical History:  Diagnosis Date   Hypertension    Past Surgical History: History reviewed. No pertinent surgical history. HPI:  Roberta Calderon is a 50 y.o. female  who presented to ER with complaint of headache and dizziness. MRI brain revealed a small right basal ganglia stroke.  MRI scan also shows several areas of microhemorrhages and in retrospect CT scan also shows tiny 2 to 3 mm left frontal subcortical microhemorrhage.  Pt with medical history significant for hypertension, hyperlipidemia, prior ICH and right thalamic infarction  She has a history of alcohol use, cocaine use and tobacco abuse.   Assessment / Plan / Recommendation Clinical Impression  Pt drowsy and reluctant to participate despite encouragement.  She sat up in bed to eat her sandwich and participated in cognitive-linguistic testing briefly. Speech was clear and fluent; no obvious deficits in expressive or receptive language. No focal cranial nerve deficits.  Attention was impaired; she was oriented to place and person, but not time  (November, 2002).  She demonstrated poor effort overall; flat affect.  She eventually closed her eyes and when asked a question would shake her head left to right and decline to answer.  Difficult to get a sense of her cognitive status at this point. Recommend ongoing SLP f/u while admitted to address further cogntiive/linguistic assessment.    SLP Assessment  SLP Recommendation/Assessment: Patient needs continued Speech [facility] SLP Visit Diagnosis: Cognitive communication deficit (R41.841)    Recommendations for follow up therapy are one component of a multi-disciplinary discharge planning process, led by the attending physician.  Recommendations may be updated based on patient status, additional functional criteria and insurance authorization.    Follow Up Recommendations  Home health SLP    Assistance Recommended at Discharge  Frequent or constant Supervision/Assistance  Functional Status Assessment Patient has had a recent decline in their functional status and demonstrates the ability to make significant improvements in function in a reasonable and predictable amount of time.  Frequency and Duration min 2x/week  1 week      SLP Evaluation Cognition  Overall Cognitive Status: Difficult to assess Arousal/Alertness: Awake/alert Orientation Level: Oriented to place;Disoriented to time;Oriented to person (2003, november) Attention: Focused Focused Attention: Impaired Focused Attention Impairment: Verbal basic Memory: Impaired Memory Impairment: Storage deficit Behaviors: Restless;Poor frustration tolerance Safety/Judgment: Impaired       Comprehension  Auditory Comprehension Yes/No Questions: Not tested Commands: Within Functional Limits Reading Comprehension Reading Status: Not tested    Expression Expression Primary Mode of Expression: Verbal Verbal Expression Overall Verbal Expression: Appears within functional limits for tasks assessed Written  Expression Dominant Hand: Right  Oral / Surveyor, quantity Overall Motor Speech: Appears within functional limits for tasks assessed            Juan Quam Laurice 08/15/2021, 5:11 PM Jerek Meulemans L. Tivis Ringer, [place] Office number [phone] Pager [phone]

## 2021-08-15 NOTE — Evaluation (Signed)
Occupational Therapy Evaluation Patient Details Name: Roberta Calderon MRN: [id number] DOB: 1971/07/23 Today's Date: 08/15/2021   History of Present Illness Roberta Calderon is a 50 y.o. female  who presented to ER with complaint of headache and dizziness. During her initial workup she then complained of chest pain. She had a complete cardiac workup that was negative.  She had negative troponins x3 and a negative CT angiography of her chest.  She then started to complain of dizziness and weakness on her right side.  She then underwent an MRI with the plan of to discharge if it was negative but it came back positive for an acute right basal ganglia infarct. Pt with medical history significant for hypertension, hyperlipidemia, prior ICH and right thalamic infarction  She has a history of alcohol use, cocaine use and tobacco abuse.  She states she last drank alcohol and used cocaine 2 days ago.   Clinical Impression   Roberta Calderon was indep without use of AD PTA. She lives in a 1 level home, 3 STE with her mom who is able to assist as needed. PLOF and home set up obtained from pt's mom. Pt was resting upon arrival with restless legs and required maximum encouragement for participation. Pt impulsively transferred OOB despite verbal cues for safety, she required min A to gain her balance in standing and min A for functional ambulation within the room. Pt was more steady with RW however demonstrated poor management, and ignored verbal cues. She also required min A for balance in standing while grooming at the sink, and her LUE was mildly uncoordinated during grooming tasks. Pt declined further assessment once back to the bed. She will benefit from OT acutely. Recommend d/c to home with direct supervision for all ADLs and mobility and HHOT.    Recommendations for follow up therapy are one component of a multi-disciplinary discharge planning process, led by the attending physician.  Recommendations may be updated  based on patient status, additional functional criteria and insurance authorization.   Follow Up Recommendations  Home health OT    Assistance Recommended at Discharge Frequent or constant Supervision/Assistance  Functional Status Assessment  Patient has had a recent decline in their functional status and demonstrates the ability to make significant improvements in function in a reasonable and predictable amount of time.  Equipment Recommendations  BSC/3in1 (RW)    Recommendations for Other Services       Precautions / Restrictions Precautions Precautions: Fall Restrictions Weight Bearing Restrictions: No      Mobility Bed Mobility Overal bed mobility: Modified Independent                  Transfers Overall transfer level: Needs assistance Equipment used: Rolling walker (2 wheels) Transfers: Sit to/from Stand Sit to Stand: Min assist           General transfer comment: min A due to LOB upon inital stand      Balance Overall balance assessment: Needs assistance Sitting-balance support: Feet supported Sitting balance-Leahy Scale: Fair     Standing balance support: No upper extremity supported;During functional activity Standing balance-Leahy Scale: Poor                             ADL either performed or assessed with clinical judgement   ADL Overall ADL's : Needs assistance/impaired Eating/Feeding: Independent;Sitting   Grooming: Min guard;Standing Grooming Details (indicate cue type and reason): at the sink. poor coordination noted with  management of the toothpaste tube Upper Body Bathing: Set up;Sitting   Lower Body Bathing: Minimal assistance;Sit to/from stand   Upper Body Dressing : Set up;Sitting   Lower Body Dressing: Minimal assistance;Sit to/from stand   Toilet Transfer: Minimal assistance;Rolling walker (2 wheels);Ambulation   Toileting- Clothing Manipulation and Hygiene: Supervision/safety;Sitting/lateral lean        Functional mobility during ADLs: Minimal assistance;Rolling walker (2 wheels) General ADL Comments: session limited to ambulation to toilet & grooming due to pt refusing to participate in further assessment. Overall she was unsteady with 2x knee buckle, more stady with RW but had poor management. Impulsive.     Vision Ability to See in Adequate Light: 0 Adequate Patient Visual Report: No change from baseline Vision Assessment?: No apparent visual deficits Additional Comments: pt declines changs in her vision, attempted visual asssessment but pt just closed her eys in attempt to go to sleep.            Pertinent Vitals/Pain Pain Assessment: Faces Faces Pain Scale: No hurt Pain Intervention(s): Monitored during session     Hand Dominance Right   Extremity/Trunk Assessment Upper Extremity Assessment Upper Extremity Assessment: RUE deficits/detail;LUE deficits/detail RUE Deficits / Details: Overall WFL for funtional tasks completed this session: brushing teeth, washing hands and face while standing at the sink. Per chart, pt has residual R weakness due to prior stroke. Pt declined ROM, MMT and coordination testing LUE Deficits / Details: Observed mildly poor coordination of LUE with funcitonal tasks at the sink. pt would not participate in MMT, ROM or coordination assessment LUE Coordination: decreased fine motor   Lower Extremity Assessment Lower Extremity Assessment: Defer to PT evaluation   Cervical / Trunk Assessment Cervical / Trunk Assessment: Normal   Communication Communication Communication: No difficulties   Cognition Arousal/Alertness: Lethargic Behavior During Therapy: Impulsive Overall Cognitive Status: Difficult to assess           General Comments: pt impulsively getting OOB upon arrival despite veral cues. Cognition difficult to assess as pt was ignoring therapist throughout and minimally stating "yeah" to some PLOF/home set up questions.     General  Comments  VSS on RA     Home Living Family/patient expects to be discharged to:: Private residence Living Arrangements: Parent Available Help at Discharge: Family;Available 24 hours/day (mother) Type of Home: House Home Access: Stairs to enter [employer] of Steps: 3 Entrance Stairs-Rails: None Home Layout: One level     Bathroom Shower/Tub: Teacher, early years/pre: Standard     Home Equipment: None   Additional Comments: home set up confirmed wtih pt's mother who stated that she will be with pt 24/7 and can physcially assist as needed      Prior Functioning/Environment Prior Level of Function : Independent/Modified Independent             Mobility Comments: no AD per mother ADLs Comments: indep per mother        OT Problem List: Decreased strength;Decreased range of motion;Decreased activity tolerance;Impaired balance (sitting and/or standing);Decreased safety awareness;Decreased knowledge of use of DME or AE;Decreased knowledge of precautions      OT Treatment/Interventions: Self-care/ADL training;Therapeutic exercise;DME and/or AE instruction;Therapeutic activities;Patient/family education;Balance training    OT Goals(Current goals can be found in the care plan section) Acute Rehab OT Goals Patient Stated Goal: to get some rest OT Goal Formulation: With patient Time For Goal Achievement: 08/29/21 Potential to Achieve Goals: Good ADL Goals Pt Will Perform Grooming: Independently;standing Pt Will Perform Lower  Body Dressing: with modified independence;sit to/from stand Pt Will Transfer to Toilet: with modified independence;ambulating Pt/caregiver will Perform Home Exercise Program: Increased ROM;Increased strength;Both right and left upper extremity;With written HEP provided  OT Frequency: Min 2X/week    AM-PAC OT "6 Clicks" Daily Activity     Outcome Measure Help from another person eating meals?: None Help from another person taking  care of personal grooming?: A Little Help from another person toileting, which includes using toliet, bedpan, or urinal?: A Little Help from another person bathing (including washing, rinsing, drying)?: A Little Help from another person to put on and taking off regular upper body clothing?: None Help from another person to put on and taking off regular lower body clothing?: A Little 6 Click Score: 20   End of Session Equipment Utilized During Treatment: Gait belt;Rolling walker (2 wheels) Nurse Communication: Mobility status  Activity Tolerance: Patient tolerated treatment well Patient left: in bed;with call bell/phone within reach;with bed alarm set  OT Visit Diagnosis: Unsteadiness on feet (R26.81);Other abnormalities of gait and mobility (R26.89);Repeated falls (R29.6);Muscle weakness (generalized) (M62.81);Hemiplegia and hemiparesis Hemiplegia - Right/Left: Left Hemiplegia - dominant/non-dominant: Non-Dominant Hemiplegia - caused by: Cerebral infarction                Time: 0950-1011 OT Time Calculation (min): 21 min Charges:  OT General Charges $OT Visit: 1 Visit OT Evaluation $OT Eval Moderate Complexity: 1 Mod   Roberta Calderon 08/15/2021, 12:03 PM

## 2021-08-15 NOTE — Progress Notes (Signed)
PROGRESS NOTE   [name]  GYF:[id number]    DOB: June 05, 1971    DOA: 08/13/2021  PCP: Patient, No Pcp Per (Inactive)   I have briefly reviewed patients previous medical records in [facility].  Chief complaint: Chest pain, headache, dizziness, worsening right-sided weakness.   Brief Narrative:  50 year old female with medical history significant for poorly controlled HTN, HLD, prior ICH and ischemic strokes, polysubstance abuse (alcohol, cocaine and tobacco), medical noncompliance, initially presented to the ED with complaints of headache and dizziness.  During her initial work-up, she also complained of chest pain.  She ruled out for ACS or PE by negative high-sensitivity troponins x3 and negative CTA chest.  She then started complaining of dizziness and worsening weakness on her right side and inability to walk.  MRI brain was positive for acute right basal ganglia infarct.  Admitted for further evaluation and management.  Neurology/stroke MD consulted.  Follow-up CT head showed 5 mm acute hemorrhage.   Assessment & Plan:  Principal Problem:   Cerebrovascular accident (CVA) of right basal ganglia (HCC) Active Problems:   Tobacco use disorder   Hypertensive urgency   Cocaine abuse (Gregory)   Metabolic syndrome   Right sided weakness   Alcohol abuse   Prolonged QT interval   Acute stroke: Complicating prior history of stroke with residual right hemiparesis.  Currently has both ischemic and hemorrhagic.  Neurology/stroke MD consulted and follow-up appreciated.  Right lacunar basal ganglia infarct likely secondary to small vessel disease secondary to uncontrolled hypertension and hyperlipidemia.  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  CT head 12/18: Reported as no acute intracranial process.  MRI brain 12/19: 4 mm acute/early subacute lacunar infarct within the right basal ganglia.  Fairly numerous supratentorial and  infratentorial chronic parenchymal microhemorrhages, in a distribution suggesting sequelae of chronic hypertensive microangiopathy.  CTA head and neck 12/20: 5 mm area of hyperdensity just above the left sylvian fissure most compatible with acute hemorrhage, unchanged from 2 days prior.  Normal CTA head.  The CT was discussed personally with Dr. Carlis Abbott, radiology.  2D echo: LVEF 70-75% moderate LVH.  No intracardiac source of embolism detected on this TTE study.  Carotid ultrasound: Bilateral ICA 1-39% stenosis.  LDL 133 and A1c 6.6.  UDS positive for benzodiazepine, opiates and cocaine.  PT and OT recommend home health therapies.  SLP unable to evaluate due to mental status changes.  Not on antithrombotics prior to admission, Plavix that was initiated was discontinued due to hemorrhagic stroke.  Listed allergy to aspirin.  As per discussion with [name], stroke MD, given hemorrhage, recommends controlling BP with SBP <160 and recommends holding antiplatelets.  Hypertensive urgency/essential hypertension: Continue amlodipine 10 Mg daily and losartan 50 Mg daily.  Added as needed IV hydralazine for SBP >160.  Needing to control blood pressure better due to hemorrhage noted above.  No permissive hypertension indicated  Acute toxic encephalopathy: As per nursing, patient was agitated earlier in the day.  Did get a dose of Ativan around noon.  This could be due to withdrawal from alcohol versus cocaine versus hypertensive encephalopathy.  Somnolent during my visit.  Monitor closely.  Hyperlipidemia: LDL 133, goal <70.  Atorvastatin 80 mg daily.  Type II DM: A1c 6.6, goal <7.  Added SSI.  Polysubstance abuse (alcohol, tobacco, cocaine): cessation to be counseled PTA.  Continue CIWA protocol.  Reportedly did cocaine 2 days prior to admission, drinks alcohol most days of the week  with reports of withdrawal symptoms when she does not drink for a few days.  Medical noncompliance: Reportedly has not seen a  physician or taken meds for a while.  Needs to be counseled prior to discharge.  Atypical chest pain: Likely related to cocaine use.  Ruled out for MI, high-sensitivity troponin: 16 > 13 > 12.  CTA chest: No PE.  Bronchitic changes.  Body mass index is 22.62 kg/m.    DVT prophylaxis: SCD's Start: 08/14/21 2207     Code Status: Full Code Family Communication: None at bedside. Disposition:  Status is: Inpatient  Remains inpatient appropriate because: Severity of illness, need for close monitoring given hemorrhagic CVA and need for adequate BP control etc.        Consultants:   Neurology.  Procedures:   None.  Antimicrobials:   None.   Subjective:  When I went to see patient earlier this morning, she was in CT.  Thereby I saw her earlier this afternoon.  Patient somnolent, arousable to call, oriented to self and person only, not cooperative to exam despite waking up.  Wants to go back to sleep.  Says she has intermittent anterior chest pain but not willing to elaborate.  Also reports right-sided weakness, unclear if back to her baseline or still worse than her baseline  Objective:   Vitals:   08/15/21 0330 08/15/21 0530 08/15/21 0904 08/15/21 1304  BP: 138/89 (!) 153/95 (!) 158/90 (!) 135/91  Pulse: 89 94 100 93  Resp:   16 18  Temp: 98.8 F (37.1 C) 98.9 F (37.2 C) 99.1 F (37.3 C) 98 F (36.7 C)  TempSrc: Oral Oral Oral Oral  SpO2: 99% 100% 97% 100%  Weight:        General exam: Young female, moderately built and nourished lying comfortably supine in bed without distress. Respiratory system: Clear to auscultation. Respiratory effort normal. Cardiovascular system: S1 & S2 heard, RRR. No JVD, murmurs, rubs, gallops or clicks. No pedal edema.   Gastrointestinal system: Abdomen is nondistended, soft and nontender. No organomegaly or masses felt. Normal bowel sounds heard. Central nervous system: Mental status as noted above.  No focal neurological  deficits. Extremities: Grade 5 x 5 power in left limbs, at least grade 4+ by 5 power in right limbs but difficult to objectively assess due to lack of full cooperation from patient. Skin: No rashes, lesions or ulcers Psychiatry: Judgement and insight currently impaired. Mood & affect cannot be currently assessed.     Data Reviewed:   I have personally reviewed following labs and imaging studies   CBC: Recent Labs  Lab 08/13/21 2307  WBC 6.3  NEUTROABS 2.9  HGB 14.3  HCT 46.4*  MCV 81.3  PLT 637    Basic Metabolic Panel: Recent Labs  Lab 08/13/21 2307  NA 137  K 3.7  CL 104  CO2 25  GLUCOSE 215*  BUN 12  CREATININE 0.77  CALCIUM 9.0    Liver Function Tests: Recent Labs  Lab 08/13/21 2307  AST 24  ALT 14  ALKPHOS 87  BILITOT 0.5  PROT 7.0  ALBUMIN 3.6    CBG: No results for input(s): GLUCAP in the last 168 hours.  Microbiology Studies:   Recent Results (from the past 240 hour(s))  Resp Panel by RT-PCR (Flu A&B, Covid) Nasopharyngeal Swab     Status: None   Collection Time: 08/14/21  7:27 PM   Specimen: Nasopharyngeal Swab; Nasopharyngeal(NP) swabs in vial transport medium  Result Value Ref Range  Status   SARS Coronavirus 2 by RT PCR NEGATIVE NEGATIVE Final    Comment: (NOTE) SARS-CoV-2 target nucleic acids are NOT DETECTED.  The SARS-CoV-2 RNA is generally detectable in upper respiratory specimens during the acute phase of infection. The lowest concentration of SARS-CoV-2 viral copies this assay can detect is 138 copies/mL. A negative result does not preclude SARS-Cov-2 infection and should not be used as the sole basis for treatment or other patient management decisions. A negative result may occur with  improper specimen collection/handling, submission of specimen other than nasopharyngeal swab, presence of viral mutation(s) within the areas targeted by this assay, and inadequate number of viral copies(<138 copies/mL). A negative result must be  combined with clinical observations, patient history, and epidemiological information. The expected result is Negative.  Fact Sheet for Patients:  EntrepreneurPulse.com.au  Fact Sheet for Healthcare Providers:  IncredibleEmployment.be  This test is no t yet approved or cleared by the [place] FDA and  has been authorized for detection and/or diagnosis of SARS-CoV-2 by FDA under an Emergency Use Authorization (EUA). This EUA will remain  in effect (meaning this test can be used) for the duration of the COVID-19 declaration under Section 564(b)(1) of the Act, 21 U.S.C.section 360bbb-3(b)(1), unless the authorization is terminated  or revoked sooner.       Influenza A by PCR NEGATIVE NEGATIVE Final   Influenza B by PCR NEGATIVE NEGATIVE Final    Comment: (NOTE) The Xpert Xpress SARS-CoV-2/FLU/RSV plus assay is intended as an aid in the diagnosis of influenza from Nasopharyngeal swab specimens and should not be used as a sole basis for treatment. Nasal washings and aspirates are unacceptable for Xpert Xpress SARS-CoV-2/FLU/RSV testing.  Fact Sheet for Patients: EntrepreneurPulse.com.au  Fact Sheet for Healthcare Providers: IncredibleEmployment.be  This test is not yet approved or cleared by the [place] FDA and has been authorized for detection and/or diagnosis of SARS-CoV-2 by FDA under an Emergency Use Authorization (EUA). This EUA will remain in effect (meaning this test can be used) for the duration of the COVID-19 declaration under Section 564(b)(1) of the Act, 21 U.S.C. section 360bbb-3(b)(1), unless the authorization is terminated or revoked.  Performed at [facility], [place] [address] [place], [place] [address]     Radiology Studies:  CT ANGIO HEAD W OR WO CONTRAST  Addendum Date: 08/15/2021   ADDENDUM REPORT: 08/15/2021 14:04 ADDENDUM: These results were called  by telephone at the time of interpretation on 08/15/2021 at 1:58 pm to provider Hongarli , who verbally acknowledged these results. Electronically Signed   By: Franchot Gallo M.D.   On: 08/15/2021 14:04   Result Date: 08/15/2021 CLINICAL DATA:  Follow-up stroke EXAM: CT ANGIOGRAPHY HEAD TECHNIQUE: Multidetector CT imaging of the head was performed using the standard protocol during bolus administration of intravenous contrast. Multiplanar CT image reconstructions and MIPs were obtained to evaluate the vascular anatomy. CONTRAST:  83mL OMNIPAQUE IOHEXOL 350 MG/ML SOLN COMPARISON:  CT head 08/13/2021, 02/07/2021.  MRI head 08/14/2021 FINDINGS: CT HEAD Brain: 5 mm hyperdensity just above the left sylvian fissure most compatible with recent hemorrhage. This was present on the CT 2 days ago but was not present on the CT from June 2022. This shows susceptibility on MRI yesterday. No other areas of acute hemorrhage. Moderate chronic microvascular ischemic change in the white matter, basal ganglia and thalamus bilaterally and in the pons. Negative for mass lesion. Ventricle size normal. Vascular: Negative for hyperdense vessel Skull: Negative Sinuses: Mild mucosal edema paranasal  sinuses.  Negative orbit Other: None CTA HEAD Anterior circulation: Internal carotid artery widely patent through the cavernous segment. Anterior and middle cerebral arteries normal bilaterally. No stenosis, occlusion, or aneurysm. Posterior circulation: Both vertebral arteries widely patent to the basilar. AICA patent bilaterally. Basilar widely patent. Superior cerebellar and posterior cerebral arteries normal bilaterally. Venous sinuses: Normal venous enhancement Anatomic variants: None Review of the MIP images confirms the above findings. IMPRESSION: 1. CT head demonstrates a 5 mm area of hyperdensity just above the left sylvian fissure most compatible with acute hemorrhage. This is unchanged from 2 days prior. 2. No acute ischemic infarct.  Moderate to extensive chronic microvascular ischemia. 3. Normal CTA head Electronically Signed: By: Franchot Gallo M.D. On: 08/15/2021 13:48   CT HEAD WO CONTRAST (5MM)  Result Date: 08/13/2021 CLINICAL DATA:  Dizziness, nonspecific EXAM: CT HEAD WITHOUT CONTRAST TECHNIQUE: Contiguous axial images were obtained from the base of the skull through the vertex without intravenous contrast. COMPARISON:  02/07/2021 FINDINGS: Brain: No evidence of acute infarction, hemorrhage, cerebral edema, mass, mass effect, or midline shift. No hydrocephalus or extra-axial fluid collection. Previously noted hyperdense material in the left pons is no longer seen, with mild hypodensity there, likely sequela of prior hypertensive microhemorrhage. Redemonstrated lacunar infarcts in the bilateral thalami and basal ganglia. Periventricular white matter changes, likely the sequela of chronic small vessel ischemic disease. Vascular: No hyperdense vessel. Skull: Normal. Negative for fracture or focal lesion. Sinuses/Orbits: Mild mucosal thickening in the posterior right ethmoid air cells. The orbits are unremarkable. Other: The mastoid air cells are well aerated. IMPRESSION: IMPRESSION No acute intracranial process. Electronically Signed   By: Merilyn Baba M.D.   On: 08/13/2021 23:23   CT Angio Chest PE W and/or Wo Contrast  Result Date: 08/14/2021 CLINICAL DATA:  Dizziness, hypertensive, high clinical suspicion of pulmonary embolism EXAM: CT ANGIOGRAPHY CHEST WITH CONTRAST TECHNIQUE: Multidetector CT imaging of the chest was performed using the standard protocol during bolus administration of intravenous contrast. Multiplanar CT image reconstructions and MIPs were obtained to evaluate the vascular anatomy. CONTRAST:  129mL OMNIPAQUE IOHEXOL 350 MG/ML SOLN IV COMPARISON:  None FINDINGS: Cardiovascular: Atherosclerotic calcifications aorta, proximal great vessels, and coronary arteries. Aorta normal caliber without aneurysm or  dissection. Heart chambers normal size. No pericardial effusion. Pulmonary arteries adequately opacified and patent. No evidence of pulmonary embolism. Mediastinum/Nodes: Base of cervical region normal appearance. No thoracic adenopathy. Esophagus unremarkable. Lungs/Pleura: Mild dependent atelectasis in the posterior lungs. Lungs otherwise clear. Central peribronchial thickening. No segmental infiltrate, pleural effusion, or pneumothorax. Upper Abdomen: No upper abdominal abnormalities Musculoskeletal: Osseous structures unremarkable. Review of the MIP images confirms the above findings. IMPRESSION: No evidence of pulmonary embolism. Bronchitic changes with dependent atelectasis in the posterior lower lobes. Scattered atherosclerotic calcifications including coronary arteries. Aortic Atherosclerosis (ICD10-I70.0). Electronically Signed   By: Lavonia Dana M.D.   On: 08/14/2021 13:35   MR BRAIN WO CONTRAST  Result Date: 08/14/2021 CLINICAL DATA:  Provided history: Neuro deficit, acute, stroke suspected. EXAM: MRI HEAD WITHOUT CONTRAST TECHNIQUE: Multiplanar, multiecho pulse sequences of the brain and surrounding structures were obtained without intravenous contrast. COMPARISON:  Noncontrast head CT 08/13/2021. CT angiogram head/neck 02/07/2021. And MRI 04/08/2020 FINDINGS: Brain: Mild-to-moderate intermittent motion degradation. Cerebral volume is normal for age. 4 mm focus of restricted diffusion within the right lentiform nucleus, compatible with acute/subacute infarct. Multiple chronic lacunar infarcts within the bilateral basal ganglia/internal capsule and thalami, as well as pons. Background moderate multifocal T2 FLAIR hyperintense signal abnormality within  the cerebral white matter and pons, nonspecific but compatible with chronic small vessel ischemic disease. Chronic small vessel ischemic changes are also present within the right middle cerebellar peduncle. Small chronic infarct within the left  cerebellar hemisphere. Fairly numerous supratentorial and infratentorial chronic parenchymal microhemorrhages with a central and posterior fossa were dominance, likely reflecting sequela of hypertensive microangiopathy. No evidence of an intracranial mass No extra-axial fluid collection. No midline shift. Vascular: Maintained flow voids within the proximal large arterial vessels. Skull and upper cervical spine: No focal suspicious marrow lesion. Incompletely assessed cervical spondylosis. Sinuses/Orbits: Visualized orbits show no acute finding. Mild mucosal thickening within the paranasal sinuses, greatest within the posterior right ethmoid air cells and left maxillary sinus. IMPRESSION: Motion degraded exam. 4 mm acute/early subacute lacunar infarct within the right basal ganglia. Multiple chronic lacunar infarcts within the bilateral basal ganglia/internal capsules and thalami, as well as pons. Background moderate chronic small vessel ischemic changes within the cerebral white matter and pons. Chronic small-vessel ischemic changes are also present within the right middle cerebellar peduncle. Small chronic infarct within the left cerebellar hemisphere. Fairly numerous supratentorial and infratentorial chronic parenchymal microhemorrhages, in a distribution suggesting sequela of chronic hypertensive microangiopathy. Mild paranasal sinus disease, as described. Electronically Signed   By: Kellie Simmering D.O.   On: 08/14/2021 18:46   ECHOCARDIOGRAM COMPLETE  Result Date: 08/15/2021    ECHOCARDIOGRAM REPORT   Patient Name:   Roberta Calderon Date of Exam: 08/15/2021 Medical Rec #:  [id number]          Height:       62.0 in Accession #:    [id number]         Weight:       123.7 lb Date of Birth:  1971-02-05           BSA:          1.558 m Patient Age:    69 years           BP:           158/90 mmHg Patient Gender: F                  HR:           100 bpm. Exam Location:  Inpatient Procedure: 2D Echo, 3D Echo, Cardiac  Doppler and Color Doppler Indications:    Stroke I63.9  History:        Patient has prior history of Echocardiogram examinations, most                 recent 02/08/2021. Risk Factors:Hypertension, Dyslipidemia and                 Current Smoker. Chest pain, headache, dizziness, weakness of                 right side. Cocaine abuse.  Sonographer:    Darlina Sicilian RDCS Referring Phys: [id number] Eben Burow  Sonographer Comments: Global longitudinal strain was attempted. IMPRESSIONS  1. Left ventricular ejection fraction, by estimation, is 70 to 75%. The left ventricle has hyperdynamic function. The left ventricle has no regional wall motion abnormalities. There is moderate concentric left ventricular hypertrophy. Left ventricular diastolic parameters are indeterminate.  2. Right ventricular systolic function is normal. The right ventricular size is normal. Tricuspid regurgitation signal is inadequate for assessing PA pressure.  3. The mitral valve is normal in structure. Trivial mitral valve regurgitation. No evidence of mitral stenosis.  4. The aortic  valve is tricuspid. Aortic valve regurgitation is not visualized. No aortic stenosis is present.  5. The inferior vena cava is normal in size with greater than 50% respiratory variability, suggesting right atrial pressure of 3 mmHg. Comparison(s): Prior images reviewed side by side. Conclusion(s)/Recommendation(s): No intracardiac source of embolism detected on this transthoracic study. Consider a transesophageal echocardiogram to exclude cardiac source of embolism if clinically indicated. LV mid cavitary gradient 52mmHg. FINDINGS  Left Ventricle: Left ventricular ejection fraction, by estimation, is 70 to 75%. The left ventricle has hyperdynamic function. The left ventricle has no regional wall motion abnormalities. Global longitudinal strain performed but not reported based on interpreter judgement due to suboptimal tracking. The left ventricular internal cavity  size was small. There is moderate concentric left ventricular hypertrophy. Left ventricular diastolic parameters are indeterminate. Right Ventricle: The right ventricular size is normal. No increase in right ventricular wall thickness. Right ventricular systolic function is normal. Tricuspid regurgitation signal is inadequate for assessing PA pressure. Left Atrium: Left atrial size was normal in size. Right Atrium: Right atrial size was normal in size. Pericardium: There is no evidence of pericardial effusion. Mitral Valve: The mitral valve is normal in structure. Trivial mitral valve regurgitation. No evidence of mitral valve stenosis. Tricuspid Valve: The tricuspid valve is not well visualized. Tricuspid valve regurgitation is not demonstrated. No evidence of tricuspid stenosis. Aortic Valve: The aortic valve is tricuspid. Aortic valve regurgitation is not visualized. No aortic stenosis is present. Pulmonic Valve: The pulmonic valve was not well visualized. Pulmonic valve regurgitation is not visualized. Aorta: The aortic root, ascending aorta, aortic arch and descending aorta are all structurally normal, with no evidence of dilitation or obstruction. Venous: The inferior vena cava is normal in size with greater than 50% respiratory variability, suggesting right atrial pressure of 3 mmHg. IAS/Shunts: The atrial septum is grossly normal.  LEFT VENTRICLE PLAX 2D LVIDd:         3.50 cm   Diastology LVIDs:         1.90 cm   LV e' medial:    5.26 cm/s LV PW:         1.47 cm   LV E/e' medial:  11.5 LV IVS:        1.60 cm   LV e' lateral:   5.52 cm/s LVOT diam:     1.90 cm   LV E/e' lateral: 10.9 LV SV:         65 LV SV Index:   41 LVOT Area:     2.84 cm                           3D Volume EF:                          3D EF:        63 %                          LV EDV:       73 ml                          LV ESV:       27 ml                          LV SV:  46 ml RIGHT VENTRICLE RV S prime:     14.80 cm/s TAPSE  (M-mode): 2.3 cm LEFT ATRIUM             Index        RIGHT ATRIUM           Index LA diam:        3.00 cm 1.92 cm/m   RA Area:     11.10 cm LA Vol (A2C):   36.3 ml 23.29 ml/m  RA Volume:   24.40 ml  15.66 ml/m LA Vol (A4C):   35.4 ml 22.71 ml/m LA Biplane Vol: 36.0 ml 23.10 ml/m  AORTIC VALVE LVOT Vmax:   117.00 cm/s LVOT Vmean:  79.300 cm/s LVOT VTI:    0.228 m  AORTA Ao Root diam: 2.80 cm Ao Asc diam:  3.10 cm MITRAL VALVE MV Area (PHT): 1.68 cm    SHUNTS MV Decel Time: 452 msec    Systemic VTI:  0.23 m MV E velocity: 60.30 cm/s  Systemic Diam: 1.90 cm MV A velocity: 99.00 cm/s MV E/A ratio:  0.61 Buford Dresser MD Electronically signed by Buford Dresser MD Signature Date/Time: 08/15/2021/12:37:27 PM    Final    VAS US CAROTID (at [facility] and [facility] only)  Result Date: 08/15/2021 Carotid Arterial Duplex Study Patient Name:  LOCKIE BOTHUN  Date of Exam:   08/15/2021 Medical Rec #: [id number]           Accession #:    [id number] Date of Birth: 1971-03-04            Patient Gender: F Patient Age:   3 years Exam Location[facility]  [facility] Procedure:      VAS US CAROTID Referring Phys: Harrold Donath --------------------------------------------------------------------------------  Indications:       CVA. Risk Factors:      Hypertension. Comparison Study:  02/07/21 prior Performing Technologist: Archie Patten RVS  Examination Guidelines: A complete evaluation includes B-mode imaging, spectral Doppler, color Doppler, and power Doppler as needed of all accessible portions of each vessel. Bilateral testing is considered an integral part of a complete examination. Limited examinations for reoccurring indications may be performed as noted.  Right Carotid Findings: +----------+--------+--------+--------+------------------+--------+             PSV cm/s EDV cm/s Stenosis Plaque Description Comments  +----------+--------+--------+--------+------------------+--------+  CCA Prox   83       19                 heterogenous                 +----------+--------+--------+--------+------------------+--------+  CCA Distal 54       17                heterogenous                 +----------+--------+--------+--------+------------------+--------+  ICA Prox   75       28       1-39%    heterogenous                 +----------+--------+--------+--------+------------------+--------+  ICA Distal 98       33                                             +----------+--------+--------+--------+------------------+--------+  ECA        95  21                                             +----------+--------+--------+--------+------------------+--------+ +----------+--------+-------+--------+-------------------+             PSV cm/s EDV cms Describe Arm Pressure (mmHG)  +----------+--------+-------+--------+-------------------+  Subclavian 103                                            +----------+--------+-------+--------+-------------------+ +---------+--------+--+--------+--+---------+  Vertebral PSV cm/s 65 EDV cm/s 15 Antegrade  +---------+--------+--+--------+--+---------+  Left Carotid Findings: +----------+--------+--------+--------+------------------+--------+             PSV cm/s EDV cm/s Stenosis Plaque Description Comments  +----------+--------+--------+--------+------------------+--------+  CCA Prox   72       15                heterogenous                 +----------+--------+--------+--------+------------------+--------+  CCA Distal 59       19                heterogenous                 +----------+--------+--------+--------+------------------+--------+  ICA Prox   59       22       1-39%    heterogenous                 +----------+--------+--------+--------+------------------+--------+  ICA Distal 93       29                                             +----------+--------+--------+--------+------------------+--------+  ECA        67       15                                              +----------+--------+--------+--------+------------------+--------+ +----------+--------+--------+--------+-------------------+             PSV cm/s EDV cm/s Describe Arm Pressure (mmHG)  +----------+--------+--------+--------+-------------------+  Subclavian 125                                             +----------+--------+--------+--------+-------------------+ +---------+--------+---+--------+--+---------+  Vertebral PSV cm/s 110 EDV cm/s 29 Antegrade  +---------+--------+---+--------+--+---------+   Summary: Right Carotid: Velocities in the right ICA are consistent with a 1-39% stenosis. Left Carotid: Velocities in the left ICA are consistent with a 1-39% stenosis. Vertebrals: Bilateral vertebral arteries demonstrate antegrade flow. *See table(s) above for measurements and observations.     Preliminary     Scheduled Meds:    amLODipine  10 mg Oral Daily   atorvastatin  80 mg Oral Daily   folic acid  1 mg Oral Daily   losartan  50 mg Oral Daily   multivitamin with minerals  1 tablet Oral Daily   nicotine  14 mg Transdermal Daily   thiamine  100 mg  Oral Daily   Or   thiamine  100 mg Intravenous Daily    Continuous Infusions:    sodium chloride 75 mL/hr at 08/14/21 2251     LOS: 1 day     Vernell Leep, MD,  FACP, [facility], [facility], [facility] (Care Management Physician Certified) [place]  To contact the attending provider between 7A-7P or the covering provider during after hours 7P-7A, please log into the web site [email or web address] and access using universal [place] password for that web site. If you do not have the password, please call the hospital operator.  08/15/2021, 3:42 PM

## 2021-08-15 NOTE — Progress Notes (Addendum)
STROKE TEAM PROGRESS NOTE   INTERVAL HISTORY Patient is seen in her room with no family at the bedside.  Yesterday, she presented to the ED with headache, lightheadedness, right sided weakness and right sided chest pain.  MRI brain revealed a small right basal ganglia stroke.  MRI scan also shows several areas of microhemorrhages and in retrospect CT scan also shows tiny 2 to 3 mm left frontal subcortical microhemorrhage.  Patient has prior history of strokes and medication noncompliance.  She also history of drug abuse and urine testing was positive for cocaine NIH 0  .ICH score 0  Vitals:   08/15/21 0130 08/15/21 0330 08/15/21 0530 08/15/21 0904  BP: 134/83 138/89 (!) 153/95 (!) 158/90  Pulse: 96 89 94 100  Resp:    16  Temp: 99 F (37.2 C) 98.8 F (37.1 C) 98.9 F (37.2 C) 99.1 F (37.3 C)  TempSrc: Oral Oral Oral Oral  SpO2: 98% 99% 100% 97%  Weight:       CBC:  Recent Labs  Lab 08/13/21 2307  WBC 6.3  NEUTROABS 2.9  HGB 14.3  HCT 46.4*  MCV 81.3  PLT 299   Basic Metabolic Panel:  Recent Labs  Lab 08/13/21 2307  NA 137  K 3.7  CL 104  CO2 25  GLUCOSE 215*  BUN 12  CREATININE 0.77  CALCIUM 9.0   Lipid Panel:  Recent Labs  Lab 08/15/21 0843  CHOL 192  TRIG 92  HDL 41  CHOLHDL 4.7  VLDL 18  LDLCALC 133*   HgbA1c:  Recent Labs  Lab 08/15/21 0843  HGBA1C 6.6*   Urine Drug Screen:  Recent Labs  Lab 08/15/21 0715  LABOPIA POSITIVE*  COCAINSCRNUR POSITIVE*  LABBENZ POSITIVE*  AMPHETMU NONE DETECTED  THCU NONE DETECTED  LABBARB NONE DETECTED    Alcohol Level  Recent Labs  Lab 08/13/21 2308  ETH <10    IMAGING past 24 hours CT Angio Chest PE W and/or Wo Contrast  Result Date: 08/14/2021 CLINICAL DATA:  Dizziness, hypertensive, high clinical suspicion of pulmonary embolism EXAM: CT ANGIOGRAPHY CHEST WITH CONTRAST TECHNIQUE: Multidetector CT imaging of the chest was performed using the standard protocol during bolus administration of  intravenous contrast. Multiplanar CT image reconstructions and MIPs were obtained to evaluate the vascular anatomy. CONTRAST:  179mL OMNIPAQUE IOHEXOL 350 MG/ML SOLN IV COMPARISON:  None FINDINGS: Cardiovascular: Atherosclerotic calcifications aorta, proximal great vessels, and coronary arteries. Aorta normal caliber without aneurysm or dissection. Heart chambers normal size. No pericardial effusion. Pulmonary arteries adequately opacified and patent. No evidence of pulmonary embolism. Mediastinum/Nodes: Base of cervical region normal appearance. No thoracic adenopathy. Esophagus unremarkable. Lungs/Pleura: Mild dependent atelectasis in the posterior lungs. Lungs otherwise clear. Central peribronchial thickening. No segmental infiltrate, pleural effusion, or pneumothorax. Upper Abdomen: No upper abdominal abnormalities Musculoskeletal: Osseous structures unremarkable. Review of the MIP images confirms the above findings. IMPRESSION: No evidence of pulmonary embolism. Bronchitic changes with dependent atelectasis in the posterior lower lobes. Scattered atherosclerotic calcifications including coronary arteries. Aortic Atherosclerosis (ICD10-I70.0). Electronically Signed   By: Lavonia Dana M.D.   On: 08/14/2021 13:35   MR BRAIN WO CONTRAST  Result Date: 08/14/2021 CLINICAL DATA:  Provided history: Neuro deficit, acute, stroke suspected. EXAM: MRI HEAD WITHOUT CONTRAST TECHNIQUE: Multiplanar, multiecho pulse sequences of the brain and surrounding structures were obtained without intravenous contrast. COMPARISON:  Noncontrast head CT 08/13/2021. CT angiogram head/neck 02/07/2021. And MRI 04/08/2020 FINDINGS: Brain: Mild-to-moderate intermittent motion degradation. Cerebral volume is normal for age. 4  mm focus of restricted diffusion within the right lentiform nucleus, compatible with acute/subacute infarct. Multiple chronic lacunar infarcts within the bilateral basal ganglia/internal capsule and thalami, as well as  pons. Background moderate multifocal T2 FLAIR hyperintense signal abnormality within the cerebral white matter and pons, nonspecific but compatible with chronic small vessel ischemic disease. Chronic small vessel ischemic changes are also present within the right middle cerebellar peduncle. Small chronic infarct within the left cerebellar hemisphere. Fairly numerous supratentorial and infratentorial chronic parenchymal microhemorrhages with a central and posterior fossa were dominance, likely reflecting sequela of hypertensive microangiopathy. No evidence of an intracranial mass No extra-axial fluid collection. No midline shift. Vascular: Maintained flow voids within the proximal large arterial vessels. Skull and upper cervical spine: No focal suspicious marrow lesion. Incompletely assessed cervical spondylosis. Sinuses/Orbits: Visualized orbits show no acute finding. Mild mucosal thickening within the paranasal sinuses, greatest within the posterior right ethmoid air cells and left maxillary sinus. IMPRESSION: Motion degraded exam. 4 mm acute/early subacute lacunar infarct within the right basal ganglia. Multiple chronic lacunar infarcts within the bilateral basal ganglia/internal capsules and thalami, as well as pons. Background moderate chronic small vessel ischemic changes within the cerebral white matter and pons. Chronic small-vessel ischemic changes are also present within the right middle cerebellar peduncle. Small chronic infarct within the left cerebellar hemisphere. Fairly numerous supratentorial and infratentorial chronic parenchymal microhemorrhages, in a distribution suggesting sequela of chronic hypertensive microangiopathy. Mild paranasal sinus disease, as described. Electronically Signed   By: Kellie Simmering D.O.   On: 08/14/2021 18:46    PHYSICAL EXAM General: Drowsy, well-developed female in no acute distress.   NEURO:  Mental Status: AA&Ox3 she is slightly uncooperative for  exam Speech/Language: speech is without dysarthria or aphasia.    Cranial Nerves:  II: PERRL.  III, IV, VI: EOMI. Eyelids elevate symmetrically.  V: Sensation is intact to light touch and symmetrical to face.  VII: Smile is symmetrical.  VIII: hearing intact to voice. IX, X: Phonation is normal.  XII: tongue is midline without fasciculations. Motor: 5/5 strength to all muscle groups tested.  Sensation- Intact to light touch bilaterally.  Coordination: FTN intact bilaterally,.No drift.  Gait- deferred   ASSESSMENT/PLAN Roberta Calderon is a 50 y.o. female with history of HTN, lacunar strokes and left pontine bleed presenting with lightheadedness, headache, chest pain and right sided weakness. Yesterday, she presented to the ED with headache, lightheadedness, right sided weakness and right sided chest pain. Utox positive for cocaine, benzodiazepines and opiates. MRI brain revealed a small right basal ganglia stroke.  No right sided weakness was appreciated on exam today, and symptoms may have been caused by a left-sided TIA.  Patient has not been taking her home antihypertensives and states that this is because she cannot afford them.  TOC consult placed for assistance with this and for assistance with substance abuse issues.  Stroke:  right lacunar basal ganglia infarct likely secondary to small vessel disease secondary to uncontrolled hypertension and hyperlipidemia.  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 CT head No acute abnormality.  CT angio head pending MRI  79mm acute/early subacute lacunar infarct in right basal ganglia, chronic lacunar infarcts in bilateral basal ganglia, thalami and pons, chronic small vessel ischemic changes Carotid Doppler  pending 2D Echo pending LDL 133 HgbA1c 6.6 VTE prophylaxis - SCDs    Diet   Diet Heart Room service appropriate? Yes; Fluid consistency: Thin   No antithrombotic prior to  admission, now  on clopidogrel 75 mg daily. Not on aspirin secondary to allergy. Therapy recommendations:  pending Disposition:  pending  Hypertension Home meds:  Norvasc 10 mg daily, lisinopril 40 mg daily, patient was not taking these at home Stable Permissive hypertension (OK if < 220/120) but gradually normalize in 5-7 days Long-term BP goal normotensive  Hyperlipidemia Home meds:  Atorvastatin 80 mg daily, patient was not taking this at home LDL 133, goal < 70 Continue statin at discharge  Diabetes type II Controlled Home meds:  none HgbA1c 6.6, goal < 7.0 CBGs No results for input(s): GLUCAP in the last 72 hours.  Will advise close follow up with PCP regarding elevated A1C.  Other Stroke Risk Factors Cigarette smokeradvised to stop smoking ETOH use, alcohol level <10, advised to drink no more than 1 drink(s) a day Substance abuse - UDS:  THC NONE DETECTED, Cocaine POSITIVE. Patient advised to stop using due to stroke risk. Hx stroke/   Other Active Problems Nonadherence to home medication regimen Patient states she cannot afford her medications TOC consult placed for assistance with this  Hospital day # [place] , MSN, AGACNP-BC [facility] See Amion for schedule and pager information 08/15/2021 11:00 AM   STROKE MD NOTE : I have personally obtained history,examined this patient, reviewed notes, independently viewed imaging studies, participated in medical decision making and plan of care.ROS completed by me personally and pertinent positives fully documented  I have made any additions or clarifications directly to the above note. Agree with note above.  Patient presented with mostly chest pain and MRI scan shows a tiny frontal subcortical lacunar infarct as well as multiple remote microhemorrhages and a smaller left frontal new microhemorrhage which is likely clinically asymptomatic.  Recommend strict blood pressure control with systolic goal  of less than 160.  Hold antiplatelet agents for 3 days and repeat CT head blood is isodense may resume aspirin.  Patient counseled to be compliant with her medications and aggressive risk factor modifications and to quit using cocaine.  Long discussion patient and Dr. Algis Liming .  Greater than 50% time during this 35-minute visit was spent in counseling and coordination of care about her lacunar stroke and small microhemorrhage and discussion about stroke prevention substance abuse and answering questions  Antony Contras, MD Medical Director [place] Pager: [phone] 08/15/2021 3:09 PM   To contact Stroke Continuity provider, please refer to [email or web address] After hours, contact General Neurology

## 2021-08-15 NOTE — Evaluation (Signed)
Physical Therapy Evaluation Patient Details Name: SHAKEMIA Calderon MRN: [id number] DOB: [birth year] Today's Date: 08/15/2021  History of Present Illness  Roberta Calderon is a 50 y.o. female  who presented to ER with complaint of headache and dizziness. During her initial workup she then complained of chest pain. She had a complete cardiac workup that was negative.  She had negative troponins x3 and a negative CT angiography of her chest.  She then started to complain of dizziness and weakness on her right side.  She then underwent an MRI with the plan of to discharge if it was negative but it came back positive for an acute right basal ganglia infarct. Pt with medical history significant for hypertension, hyperlipidemia, prior ICH and right thalamic infarction  She has a history of alcohol use, cocaine use and tobacco abuse.  She states she last drank alcohol and used cocaine 2 days ago.   Clinical Impression  Pt admitted with above diagnosis. Pt currently with functional limitations due to the deficits listed below (see PT Problem List). At the time of PT eval pt was able to perform transfers with up to min assist. Pt impulsive and overall uncooperative with PT assessment. Pt ignoring therapist by end of session and refused to speak or look at therapist so session ended. Pt will benefit from skilled PT to increase their independence and safety with mobility to allow discharge to the venue listed below.          Recommendations for follow up therapy are one component of a multi-disciplinary discharge planning process, led by the attending physician.  Recommendations may be updated based on patient status, additional functional criteria and insurance authorization.  Follow Up Recommendations Home health PT    Assistance Recommended at Discharge Frequent or constant Supervision/Assistance  Functional Status Assessment Patient has had a recent decline in their functional status and demonstrates the  ability to make significant improvements in function in a reasonable and predictable amount of time.  Equipment Recommendations  Rolling walker (2 wheels);BSC/3in1    Recommendations for Other Services       Precautions / Restrictions Precautions Precautions: Fall Precaution Comments: Impulsive Restrictions Weight Bearing Restrictions: No      Mobility  Bed Mobility Overal bed mobility: Modified Independent             General bed mobility comments: No assist. Uncoordinated and increased time to manage bed linen but able to complete with mod I.    Transfers Overall transfer level: Needs assistance Equipment used: 1 person hand held assist Transfers: Sit to/from Stand;Bed to chair/wheelchair/BSC Sit to Stand: Min assist   Step pivot transfers: Min assist       General transfer comment: Min assist throughout for balance support and safety.    Ambulation/Gait               General Gait Details: Pt not agreeable to further mobility  Stairs            Wheelchair Mobility    Modified Rankin (Stroke Patients Only)       Balance Overall balance assessment: Needs assistance Sitting-balance support: Feet supported Sitting balance-Leahy Scale: Fair     Standing balance support: No upper extremity supported;During functional activity Standing balance-Leahy Scale: Poor                               Pertinent Vitals/Pain Pain Assessment: Faces Faces Pain Scale: No hurt  Pain Intervention(s): Monitored during session    Home Living Family/patient expects to be discharged to:: Private residence Living Arrangements: Parent Available Help at Discharge: Family;Available 24 hours/day (mother) Type of Home: House Home Access: Stairs to enter Entrance Stairs-Rails: None Entrance Stairs-Number of Steps: 3   Home Layout: One level Home Equipment: None Additional Comments: OT confirmed home set up wtih pt's mother who stated that she will  be with pt 24/7 and can physcially assist as needed    Prior Function Prior Level of Function : Independent/Modified Independent             Mobility Comments: no AD per mother ADLs Comments: indep per mother     Hand Dominance   Dominant Hand: Right    Extremity/Trunk Assessment   Upper Extremity Assessment Upper Extremity Assessment: Defer to OT evaluation    Lower Extremity Assessment Lower Extremity Assessment: RLE deficits/detail RLE Deficits / Details: strength appears equal grossly with the minimal MMT pt was agreeable to. Mild coordination deficits noted with OOB.    Cervical / Trunk Assessment Cervical / Trunk Assessment: Normal  Communication   Communication: No difficulties  Cognition Arousal/Alertness: Lethargic Behavior During Therapy: Impulsive Overall Cognitive Status: Difficult to assess                                 General Comments: Impulsive and not following commands for safety. Pt easily agitated with questions, assessment.        General Comments      Exercises     Assessment/Plan    PT Assessment Patient needs continued PT services  PT Problem List Decreased strength;Decreased range of motion;Decreased balance;Decreased activity tolerance;Decreased mobility;Decreased knowledge of use of DME;Decreased safety awareness;Decreased knowledge of precautions;Pain       PT Treatment Interventions DME instruction;Gait training;Stair training;Functional mobility training;Therapeutic activities;Therapeutic exercise;Neuromuscular re-education;Patient/family education;Cognitive remediation    PT Goals (Current goals can be found in the Care Plan section)  Acute Rehab PT Goals Patient Stated Goal: None stated. PT Goal Formulation: Patient unable to participate in goal setting Time For Goal Achievement: 08/29/21 Potential to Achieve Goals: Good    Frequency Min 4X/week   Barriers to discharge        Co-evaluation                AM-PAC PT "6 Clicks" Mobility  Outcome Measure Help needed turning from your back to your side while in a flat bed without using bedrails?: None Help needed moving from lying on your back to sitting on the side of a flat bed without using bedrails?: None Help needed moving to and from a bed to a chair (including a wheelchair)?: A Little Help needed standing up from a chair using your arms (e.g., wheelchair or bedside chair)?: A Little Help needed to walk in hospital room?: A Little Help needed climbing 3-5 steps with a railing? : A Little 6 Click Score: 20    End of Session Equipment Utilized During Treatment: Gait belt Activity Tolerance: Treatment limited secondary to agitation;Other (comment) (Treatment limited to cognition) Patient left: in bed;with call bell/phone within reach;with bed alarm set Nurse Communication: Mobility status PT Visit Diagnosis: Other symptoms and signs involving the nervous system (B15.176)    Time: 1607-3710 PT Time Calculation (min) (ACUTE ONLY): 19 min   Charges:   PT Evaluation $PT Eval Moderate Complexity: 1 Mod          Mickel Baas  Rich Reining, PT, DPT Acute Rehabilitation Services Pager: [phone] Office: [phone]   Thelma Comp 08/15/2021, 3:49 PM

## 2021-08-16 ENCOUNTER — Encounter (HOSPITAL_COMMUNITY): Payer: Self-pay | Admitting: Family Medicine

## 2021-08-16 LAB — GLUCOSE, CAPILLARY
Glucose-Capillary: 201 mg/dL — ABNORMAL HIGH (ref 70–99)
Glucose-Capillary: 152 mg/dL — ABNORMAL HIGH (ref 70–99)
Glucose-Capillary: 155 mg/dL — ABNORMAL HIGH (ref 70–99)

## 2021-08-16 NOTE — Progress Notes (Signed)
Physical Therapy Treatment Patient Details Name: Roberta Calderon MRN: [id number] DOB: 1971-01-09 Today's Date: 08/16/2021   History of Present Illness Roberta Calderon is a 50 y.o. female  who presented to ER with complaint of headache and dizziness. During her initial workup she then complained of chest pain. She had a complete cardiac workup that was negative.  She had negative troponins x3 and a negative CT angiography of her chest.  She then started to complain of dizziness and weakness on her right side.  She then underwent an MRI with the plan of to discharge if it was negative but it came back positive for an acute right basal ganglia infarct. Pt with medical history significant for hypertension, hyperlipidemia, prior ICH and right thalamic infarction  She has a history of alcohol use, cocaine use and tobacco abuse.  She states she last drank alcohol and used cocaine 2 days ago.    PT Comments    Pt making good progress. Able to ambulate in hall and perform stairs with min guard and no overt LOB.  Pt with flat affect and was participatory with therapy but seemed to have no further interest in questions or treatments past ambulation - just c/o dizzy and wants to sleep.  Will continue to progress as able.    Recommendations for follow up therapy are one component of a multi-disciplinary discharge planning process, led by the attending physician.  Recommendations may be updated based on patient status, additional functional criteria and insurance authorization.  Follow Up Recommendations  Home health PT (could consider outpt PT if HHPT not option)     Assistance Recommended at Discharge    Equipment Recommendations  Rolling walker (2 wheels)    Recommendations for Other Services       Precautions / Restrictions Precautions Precautions: Fall Restrictions Weight Bearing Restrictions: No     Mobility  Bed Mobility Overal bed mobility: Modified Independent              General bed mobility comments: no assit    Transfers Overall transfer level: Needs assistance Equipment used: Rolling walker (2 wheels) Transfers: Sit to/from Stand Sit to Stand: Supervision           General transfer comment: close supervision for safety; stood from bed and toilet; toileting ADLs independently    Ambulation/Gait Ambulation/Gait assistance: Min guard Gait Distance (Feet): 150 Feet Assistive device: Rolling walker (2 wheels) Gait Pattern/deviations: Step-through pattern;Decreased stride length Gait velocity: decreased     General Gait Details: min guard for safety with cues for RW; no overt LOB   Stairs Stairs: Yes Stairs assistance: Min guard Stair Management: Step to pattern;Forwards;Alternating pattern Number of Stairs: 5 General stair comments: Some step to and some alternating; did cue on up with good (left) and down with bad (right) but pt able to alternate   Wheelchair Mobility    Modified Rankin (Stroke Patients Only) Modified Rankin (Stroke Patients Only) Pre-Morbid Rankin Score: No symptoms Modified Rankin: Moderate disability     Balance Overall balance assessment: Needs assistance Sitting-balance support: Feet supported Sitting balance-Leahy Scale: Good     Standing balance support: No upper extremity supported;During functional activity;Bilateral upper extremity supported Standing balance-Leahy Scale: Fair Standing balance comment: RW to ambulate but could static stand and ADLs wtihout support                            Cognition Arousal/Alertness: Lethargic Behavior During Therapy: Flat affect  Overall Cognitive Status: Difficult to assess                                 General Comments: Pt very lethargic requiring increased time to arouse but then able to participate.  Pt with flat affect and seems to be easily aggravated with questions.  However, overall cognition seems WFL.  Pt claiming she had  not been told she had a stroke        Exercises      General Comments General comments (skin integrity, edema, etc.): VSS.  Pt overall flat affect and disinterested in therapy suggestions/education/etc.  Tried to ask questions about dizziness symptoms and offer treatments/exercises but pt just stating she is dizzy and wants to sleep.  Did say it was better in supine.      Pertinent Vitals/Pain Pain Assessment: No/denies pain    Home Living                          Prior Function            PT Goals (current goals can now be found in the care plan section) Progress towards PT goals: Progressing toward goals    Frequency    Min 4X/week      PT Plan Discharge plan needs to be updated    Co-evaluation              AM-PAC PT "6 Clicks" Mobility   Outcome Measure  Help needed turning from your back to your side while in a flat bed without using bedrails?: None Help needed moving from lying on your back to sitting on the side of a flat bed without using bedrails?: None Help needed moving to and from a bed to a chair (including a wheelchair)?: A Little Help needed standing up from a chair using your arms (e.g., wheelchair or bedside chair)?: A Little Help needed to walk in hospital room?: A Little Help needed climbing 3-5 steps with a railing? : A Little 6 Click Score: 20    End of Session Equipment Utilized During Treatment: Gait belt Activity Tolerance: Patient tolerated treatment well Patient left: in bed;with call bell/phone within reach;with bed alarm set Nurse Communication: Mobility status PT Visit Diagnosis: Other symptoms and signs involving the nervous system (R29.898)     Time: 1540-0867 PT Time Calculation (min) (ACUTE ONLY): 20 min  Charges:  $Gait Training: 8-22 mins                     Abran Richard, PT Acute Rehab Services Pager [phone] [facility] [phone]    Karlton Lemon 08/16/2021, 11:30 AM

## 2021-08-16 NOTE — TOC CAGE-AID Note (Signed)
Transition of Care [facility]) - CAGE-AID Screening   Patient Details  Name: ANTANETTE RICHWINE MRN: [id number] Date of Birth: 01/14/1971  Transition of Care [facility]) CM/SW Contact:    Jayle Solarz C Tarpley-Carter, [place] Phone Number: 08/16/2021, 12:26 PM   Clinical Narrative: Pt participated in [place].  Pt stated she does not use substance or ETOH.  Tests were positive for substance. Pt was offered resources due to substance use.  CSW will provide pt with resources for possible future use.  Coner Gibbard Tarpley-Carter, MSW, LCSW-A Pronouns:  She/Her/Hers [place] Transitions of Care Clinical Social Worker Direct Number:  [phone] [email or web address]    CAGE-AID Screening:    Have You Ever Felt You Ought to Cut Down on Your Drinking or Drug Use?: No Have People Annoyed You By [employer] Your Drinking Or Drug Use?: No Have You Felt Bad Or Guilty About Your Drinking Or Drug Use?: No Have You Ever Had a Drink or Used Drugs First Thing In The Morning to Steady Your Nerves or to Get Rid of a Hangover?: No CAGE-AID Score: 0  Substance Abuse Education Offered: Yes  Substance abuse interventions: Scientist, clinical (histocompatibility and immunogenetics)

## 2021-08-16 NOTE — Progress Notes (Signed)
PROGRESS NOTE  VASTI YAGI ERD:[id number] DOB: [birth year] DOA: 08/13/2021 PCP: Patient, No Pcp Per (Inactive)   LOS: 2 days   Brief Narrative / Interim history: 50 year old female with medical history significant for poorly controlled HTN, HLD, prior ICH and ischemic strokes, polysubstance abuse (alcohol, cocaine and tobacco), medical noncompliance, initially presented to the ED with complaints of headache and dizziness.  During her initial work-up, she also complained of chest pain.  She ruled out for ACS or PE by negative high-sensitivity troponins x3 and negative CTA chest.  She then started complaining of dizziness and worsening weakness on her right side and inability to walk.  MRI brain was positive for acute right basal ganglia infarct.  Admitted for further evaluation and management.  Neurology/stroke MD consulted.  Follow-up CT head showed 5 mm acute hemorrhage.  Subjective / 24h Interval events: Doesn't engage in conversation with me, just tells me "I am tired". Keeps her eyes closed. Does not answer my questions  Assessment & Plan: Principal Problem Acute stroke-Complicating prior history of stroke with residual right hemiparesis.  Currently has both ischemic and hemorrhagic.  Neurology/stroke MD consulted and follow-up appreciated.  Right lacunar basal ganglia infarct likely secondary to small vessel disease secondary to uncontrolled hypertension and hyperlipidemia.  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  CT head 12/18: Reported as no acute intracranial process.  MRI brain 12/19: 4 mm acute/early subacute lacunar infarct within the right basal ganglia.  Fairly numerous supratentorial and infratentorial chronic parenchymal microhemorrhages, in a distribution suggesting sequelae of chronic hypertensive microangiopathy.  CTA head and neck 12/20: 5 mm area of hyperdensity just above the left sylvian fissure most compatible with acute  hemorrhage, unchanged from 2 days prior.  Normal CTA head. 2D echo: LVEF 70-75% moderate LVH.  No intracardiac source of embolism detected on this TTE study.  Carotid ultrasound: Bilateral ICA 1-39% stenosis.  LDL 133 and A1c 6.6.  UDS positive for benzodiazepine, opiates and cocaine.  PT and OT recommend home health therapies.  SLP unable to evaluate due to mental status changes.  Not on antithrombotics prior to admission, Plavix that was initiated was discontinued due to hemorrhagic stroke.  Listed allergy to aspirin.  As per discussion with [name], stroke MD, given hemorrhage, recommends controlling BP with SBP <160 and recommends holding antiplatelets. Appreciate follow up  Active Problems Hypertensive urgency/essential hypertension-Continue amlodipine 10 Mg daily and losartan 50 Mg daily.  Added as needed IV hydralazine for SBP >160.  Needing to control blood pressure better due to hemorrhage noted above.  No permissive hypertension indicated   Acute toxic encephalopathy-This could be due to withdrawal from alcohol versus cocaine versus hypertensive encephalopathy. Somnolent during my visit, doesn't engage   Hyperlipidemia -LDL 133, goal <70.  Atorvastatin 80 mg daily.   Type II DM-A1c 6.6, goal <7.  Added SSI.   Polysubstance abuse (alcohol, tobacco, cocaine)-Continue CIWA protocol.  Reportedly did cocaine 2 days prior to admission, drinks alcohol most days of the week with reports of withdrawal symptoms when she does not drink for a few days.   Medical noncompliance-Reportedly has not seen a physician or taken meds for a while.  Needs to be counseled prior to discharge.   Atypical chest pain-Likely related to cocaine use.  Ruled out for MI, high-sensitivity troponin: 16 > 13 > 12.  CTA chest: No PE.  Bronchitic changes.  Scheduled Meds:  amLODipine  10 mg Oral Daily   atorvastatin  80 mg  Oral Daily   folic acid  1 mg Oral Daily   insulin aspart  0-9 Units Subcutaneous TID WC    losartan  50 mg Oral Daily   multivitamin with minerals  1 tablet Oral Daily   nicotine  14 mg Transdermal Daily   thiamine  100 mg Oral Daily   Or   thiamine  100 mg Intravenous Daily   Continuous Infusions:  sodium chloride 10 mL/hr at 08/15/21 1803   PRN Meds:.acetaminophen **OR** acetaminophen (TYLENOL) oral liquid 160 mg/5 mL **OR** acetaminophen, hydrALAZINE, LORazepam **OR** LORazepam, senna-docusate  Diet Orders (From admission, onward)     Start     Ordered   08/15/21 1652  Diet heart healthy/carb modified Room service appropriate? Yes; Fluid consistency: Thin  Diet effective now       Question Answer Comment  Diet-HS Snack? Nothing   Room service appropriate? Yes   Fluid consistency: Thin      08/15/21 1652            DVT prophylaxis: SCD's Start: 08/14/21 2207     Code Status: Full Code  Family Communication: no family at bedside   Status is: Inpatient  Remains inpatient appropriate because: persistent somnolence  Level of care: Telemetry Medical  Consultants:  Neurology   Procedures:  2D echo  Microbiology  none  Antimicrobials: none    Objective: Vitals:   08/16/21 0100 08/16/21 0402 08/16/21 0736 08/16/21 0911  BP: (!) 146/86 (!) 150/92 (!) 162/84 (!) 147/96  Pulse: 90 76 88 93  Resp:  20 14 18   Temp:  99 F (37.2 C) 98.3 F (36.8 C) 98.1 F (36.7 C)  TempSrc:  Oral Oral   SpO2:  100% 99% 96%  Weight:        Intake/Output Summary (Last 24 hours) at 08/16/2021 1202 Last data filed at 08/15/2021 1600 Gross per 24 hour  Intake 502.02 ml  Output --  Net 502.02 ml   Filed Weights   08/14/21 2126  Weight: 56.1 kg    Examination:  Constitutional: NAD Eyes: no scleral icterus ENMT: Mucous membranes are moist.  Neck: normal, supple Respiratory: clear to auscultation bilaterally, no wheezing, no crackles. Normal respiratory effort.  Cardiovascular: Regular rate and rhythm, no murmurs / rubs / gallops.  Abdomen: non  distended, no tenderness. Bowel sounds positive.  Musculoskeletal: no clubbing / cyanosis.  Skin: no rashes Neurologic: does not participate in exam   Data Reviewed: I have independently reviewed following labs and imaging studies   CBC: Recent Labs  Lab 08/13/21 2307  WBC 6.3  NEUTROABS 2.9  HGB 14.3  HCT 46.4*  MCV 81.3  PLT 606   Basic Metabolic Panel: Recent Labs  Lab 08/13/21 2307  NA 137  K 3.7  CL 104  CO2 25  GLUCOSE 215*  BUN 12  CREATININE 0.77  CALCIUM 9.0   Liver Function Tests: Recent Labs  Lab 08/13/21 2307  AST 24  ALT 14  ALKPHOS 87  BILITOT 0.5  PROT 7.0  ALBUMIN 3.6   Coagulation Profile: Recent Labs  Lab 08/13/21 2307  INR 1.0   HbA1C: Recent Labs    08/15/21 0843  HGBA1C 6.6*   CBG: Recent Labs  Lab 08/15/21 2124  GLUCAP 112*    Recent Results (from the past 240 hour(s))  Resp Panel by RT-PCR (Flu A&B, Covid) Nasopharyngeal Swab     Status: None   Collection Time: 08/14/21  7:27 PM   Specimen: Nasopharyngeal Swab; Nasopharyngeal(NP) swabs in  vial transport medium  Result Value Ref Range Status   SARS Coronavirus 2 by RT PCR NEGATIVE NEGATIVE Final    Comment: (NOTE) SARS-CoV-2 target nucleic acids are NOT DETECTED.  The SARS-CoV-2 RNA is generally detectable in upper respiratory specimens during the acute phase of infection. The lowest concentration of SARS-CoV-2 viral copies this assay can detect is 138 copies/mL. A negative result does not preclude SARS-Cov-2 infection and should not be used as the sole basis for treatment or other patient management decisions. A negative result may occur with  improper specimen collection/handling, submission of specimen other than nasopharyngeal swab, presence of viral mutation(s) within the areas targeted by this assay, and inadequate number of viral copies(<138 copies/mL). A negative result must be combined with clinical observations, patient history, and  epidemiological information. The expected result is Negative.  Fact Sheet for Patients:  EntrepreneurPulse.com.au  Fact Sheet for Healthcare Providers:  IncredibleEmployment.be  This test is no t yet approved or cleared by the [place] FDA and  has been authorized for detection and/or diagnosis of SARS-CoV-2 by FDA under an Emergency Use Authorization (EUA). This EUA will remain  in effect (meaning this test can be used) for the duration of the COVID-19 declaration under Section 564(b)(1) of the Act, 21 U.S.C.section 360bbb-3(b)(1), unless the authorization is terminated  or revoked sooner.       Influenza A by PCR NEGATIVE NEGATIVE Final   Influenza B by PCR NEGATIVE NEGATIVE Final    Comment: (NOTE) The Xpert Xpress SARS-CoV-2/FLU/RSV plus assay is intended as an aid in the diagnosis of influenza from Nasopharyngeal swab specimens and should not be used as a sole basis for treatment. Nasal washings and aspirates are unacceptable for Xpert Xpress SARS-CoV-2/FLU/RSV testing.  Fact Sheet for Patients: EntrepreneurPulse.com.au  Fact Sheet for Healthcare Providers: IncredibleEmployment.be  This test is not yet approved or cleared by the [place] FDA and has been authorized for detection and/or diagnosis of SARS-CoV-2 by FDA under an Emergency Use Authorization (EUA). This EUA will remain in effect (meaning this test can be used) for the duration of the COVID-19 declaration under Section 564(b)(1) of the Act, 21 U.S.C. section 360bbb-3(b)(1), unless the authorization is terminated or revoked.  Performed at [facility], [place] [address], [place], [place] [address]      Radiology Studies: CT ANGIO HEAD W OR WO CONTRAST  Addendum Date: 08/15/2021   ADDENDUM REPORT: 08/15/2021 14:04 ADDENDUM: These results were called by telephone at the time of interpretation on 08/15/2021 at  1:58 pm to provider [place] , who verbally acknowledged these results. Electronically Signed   By: Franchot Gallo M.D.   On: 08/15/2021 14:04   Result Date: 08/15/2021 CLINICAL DATA:  Follow-up stroke EXAM: CT ANGIOGRAPHY HEAD TECHNIQUE: Multidetector CT imaging of the head was performed using the standard protocol during bolus administration of intravenous contrast. Multiplanar CT image reconstructions and MIPs were obtained to evaluate the vascular anatomy. CONTRAST:  68mL OMNIPAQUE IOHEXOL 350 MG/ML SOLN COMPARISON:  CT head 08/13/2021, 02/07/2021.  MRI head 08/14/2021 FINDINGS: CT HEAD Brain: 5 mm hyperdensity just above the left sylvian fissure most compatible with recent hemorrhage. This was present on the CT 2 days ago but was not present on the CT from June 2022. This shows susceptibility on MRI yesterday. No other areas of acute hemorrhage. Moderate chronic microvascular ischemic change in the white matter, basal ganglia and thalamus bilaterally and in the pons. Negative for mass lesion. Ventricle size normal. Vascular: Negative for hyperdense  vessel Skull: Negative Sinuses: Mild mucosal edema paranasal sinuses.  Negative orbit Other: None CTA HEAD Anterior circulation: Internal carotid artery widely patent through the cavernous segment. Anterior and middle cerebral arteries normal bilaterally. No stenosis, occlusion, or aneurysm. Posterior circulation: Both vertebral arteries widely patent to the basilar. AICA patent bilaterally. Basilar widely patent. Superior cerebellar and posterior cerebral arteries normal bilaterally. Venous sinuses: Normal venous enhancement Anatomic variants: None Review of the MIP images confirms the above findings. IMPRESSION: 1. CT head demonstrates a 5 mm area of hyperdensity just above the left sylvian fissure most compatible with acute hemorrhage. This is unchanged from 2 days prior. 2. No acute ischemic infarct. Moderate to extensive chronic microvascular ischemia. 3.  Normal CTA head Electronically Signed: By: Franchot Gallo M.D. On: 08/15/2021 13:48     Marzetta Board, MD, PhD [facility]  Between 7 am - 7 pm I am available, please contact me via Amion (for emergencies) or Securechat (non urgent messages)  Between 7 pm - 7 am I am not available, please contact night coverage MD/APP via Amion

## 2021-08-16 NOTE — Progress Notes (Signed)
C/o CP but upon assessment, found the pain was directly over her right breast.  Area is tender to touch and feels slightly hard and warmer.  Roberta Calderon it has been hurting for several days but she just ignores it. Pain is worse with pressure to area.  Did note that the telemetry box is sitting over area and when removed, said it felt better.  I did page MD to notify.

## 2021-08-16 NOTE — Plan of Care (Signed)
°  Problem: Education: Goal: Knowledge of disease or condition will improve Outcome: Progressing Goal: Knowledge of secondary prevention will improve (SELECT ALL) Outcome: Progressing   Problem: Self-Care: Goal: Ability to communicate needs accurately will improve Outcome: Progressing   Problem: Nutrition: Goal: Risk of aspiration will decrease Outcome: Progressing   Problem: Ischemic Stroke/TIA Tissue Perfusion: Goal: Complications of ischemic stroke/TIA will be minimized Outcome: Progressing   Problem: Coping: Goal: Level of anxiety will decrease Outcome: Progressing

## 2021-08-16 NOTE — Progress Notes (Signed)
[place] called and asked to mark the area on the breast that is hurting. Patient unable to find the area that hurts her. Nothing warm, red or swollen. No area tender to touch.  Will notify MD.

## 2021-08-16 NOTE — Progress Notes (Signed)
STROKE TEAM PROGRESS NOTE   INTERVAL HISTORY Patient is seen in her room with no family at the bedside.   She is lying comfortably in the bed.  She has no complaints.  Therapies recommend home health PT.  Recommend repeat CT head tomorrow if hemorrhage is stable she can be discharged.  Continue to hold antiplatelets for now  Vitals:   08/16/21 0402 08/16/21 0736 08/16/21 0911 08/16/21 1212  BP: (!) 150/92 (!) 162/84 (!) 147/96 (!) 138/104  Pulse: 76 88 93 (!) 107  Resp: 20 14 18 18   Temp: 99 F (37.2 C) 98.3 F (36.8 C) 98.1 F (36.7 C) 98.3 F (36.8 C)  TempSrc: Oral Oral    SpO2: 100% 99% 96% 100%  Weight:       CBC:  Recent Labs  Lab 08/13/21 2307  WBC 6.3  NEUTROABS 2.9  HGB 14.3  HCT 46.4*  MCV 81.3  PLT 003   Basic Metabolic Panel:  Recent Labs  Lab 08/13/21 2307  NA 137  K 3.7  CL 104  CO2 25  GLUCOSE 215*  BUN 12  CREATININE 0.77  CALCIUM 9.0   Lipid Panel:  Recent Labs  Lab 08/15/21 0843  CHOL 192  TRIG 92  HDL 41  CHOLHDL 4.7  VLDL 18  LDLCALC 133*   HgbA1c:  Recent Labs  Lab 08/15/21 0843  HGBA1C 6.6*   Urine Drug Screen:  Recent Labs  Lab 08/15/21 0715  LABOPIA POSITIVE*  COCAINSCRNUR POSITIVE*  LABBENZ POSITIVE*  AMPHETMU NONE DETECTED  THCU NONE DETECTED  LABBARB NONE DETECTED    Alcohol Level  Recent Labs  Lab 08/13/21 2308  ETH <10    IMAGING past 24 hours No results found.  PHYSICAL EXAM General: Drowsy, well-developed female in no acute distress.   NEURO:  Mental Status: AA&Ox3 she is slightly uncooperative for exam Speech/Language: speech is without dysarthria or aphasia.    Cranial Nerves:  II: PERRL.  III, IV, VI: EOMI. Eyelids elevate symmetrically.  V: Sensation is intact to light touch and symmetrical to face.  VII: Smile is symmetrical.  VIII: hearing intact to voice. IX, X: Phonation is normal.  XII: tongue is midline without fasciculations. Motor: 5/5 strength to all muscle groups tested.   Sensation- Intact to light touch bilaterally.  Coordination: FTN intact bilaterally,.No drift.  Gait- deferred   ASSESSMENT/PLAN [name] is a 50 y.o. female with history of HTN, lacunar strokes and left pontine bleed presenting with lightheadedness, headache, chest pain and right sided weakness. Yesterday, she presented to the ED with headache, lightheadedness, right sided weakness and right sided chest pain. Utox positive for cocaine, benzodiazepines and opiates. MRI brain revealed a small right basal ganglia stroke.  No right sided weakness was appreciated on exam today, and symptoms may have been caused by a left-sided TIA.  Patient has not been taking her home antihypertensives and states that this is because she cannot afford them.  TOC consult placed for assistance with this and for assistance with substance abuse issues.  Stroke:  right lacunar basal ganglia infarct likely secondary to small vessel disease secondary to uncontrolled hypertension and hyperlipidemia.  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 CT head No acute abnormality.  CT angio head pending MRI  16mm acute/early subacute lacunar infarct in right basal ganglia, chronic lacunar infarcts in bilateral basal ganglia, thalami and pons, chronic small vessel ischemic changes Carotid Doppler  pending 2D Echo pending LDL 133 HgbA1c  6.6 VTE prophylaxis - SCDs    Diet   Diet heart healthy/carb modified Room service appropriate? Yes; Fluid consistency: Thin   No antithrombotic prior to admission, now on clopidogrel 75 mg daily. Not on aspirin secondary to allergy. Therapy recommendations: Home health PT  disposition: Home Hypertension Home meds:  Norvasc 10 mg daily, lisinopril 40 mg daily, patient was not taking these at home Stable Permissive hypertension (OK if < 220/120) but gradually normalize in 5-7 days Long-term BP goal  normotensive  Hyperlipidemia Home meds:  Atorvastatin 80 mg daily, patient was not taking this at home LDL 133, goal < 70 Continue statin at discharge  Diabetes type II Controlled Home meds:  none HgbA1c 6.6, goal < 7.0 CBGs Recent Labs    08/15/21 2124 08/16/21 1324  GLUCAP 112* 201*    Will advise close follow up with PCP regarding elevated A1C.  Other Stroke Risk Factors Cigarette smokeradvised to stop smoking ETOH use, alcohol level <10, advised to drink no more than 1 drink(s) a day Substance abuse - UDS:  THC NONE DETECTED, Cocaine POSITIVE. Patient advised to stop using due to stroke risk. Hx stroke/   Other Active Problems Nonadherence to home medication regimen Patient states she cannot afford her medications TOC consult placed for assistance with this  Hospital day # 2   Patient presented with mostly chest pain and MRI scan shows a tiny frontal subcortical lacunar infarct as well as multiple remote microhemorrhages and a smaller left frontal new microhemorrhage which is likely clinically asymptomatic.  Recommend strict blood pressure control with systolic goal of less than 160.  Hold antiplatelet agents for 3 days and repeat CT head tomorrow morning and if blood is isodense may resume aspirin 81 mg daily alone.  Patient counseled to be compliant with her medications and aggressive risk factor modifications and to quit using cocaine.  Long discussion patient and Dr. Cruzita Lederer .  Greater than 50% time during this 25-minute visit was spent in counseling and coordination of care about her lacunar stroke and small microhemorrhage and discussion about stroke prevention substance abuse and answering questions  Antony Contras, MD Medical Director [facility] Pager: [phone] 08/16/2021 2:23 PM   To contact Stroke Continuity provider, please refer to [email or web address] After hours, contact General Neurology

## 2021-08-17 ENCOUNTER — Inpatient Hospital Stay (HOSPITAL_COMMUNITY): Payer: Self-pay

## 2021-08-17 ENCOUNTER — Other Ambulatory Visit (HOSPITAL_COMMUNITY): Payer: Self-pay

## 2021-08-17 LAB — GLUCOSE, CAPILLARY
Glucose-Capillary: 129 mg/dL — ABNORMAL HIGH (ref 70–99)
Glucose-Capillary: 144 mg/dL — ABNORMAL HIGH (ref 70–99)
Glucose-Capillary: 155 mg/dL — ABNORMAL HIGH (ref 70–99)
Glucose-Capillary: 176 mg/dL — ABNORMAL HIGH (ref 70–99)

## 2021-08-17 IMAGING — CT CT HEAD W/O CM
4 series · 16 of 47 positions shown, 18 images · non-contrast
Comparison: Brain MRI [DATE]. CT head [DATE] and
[DATE].

CLINICAL DATA: 50-year-old female punctate right lentiform lacunar
infarct on MRI with severe underlying chronic small vessel disease.
Trace hemorrhage left sylvian fissure on CT.

EXAM:
CT HEAD WITHOUT CONTRAST
TECHNIQUE: Contiguous axial images were obtained from the base of the skull
through the vertex without intravenous contrast.

[Series 2: head wo · axial · 0.45mm/px · z∈[-165,-50]mm · 7 of 31 slices shown, 9 images]
[im 4/31  brain]
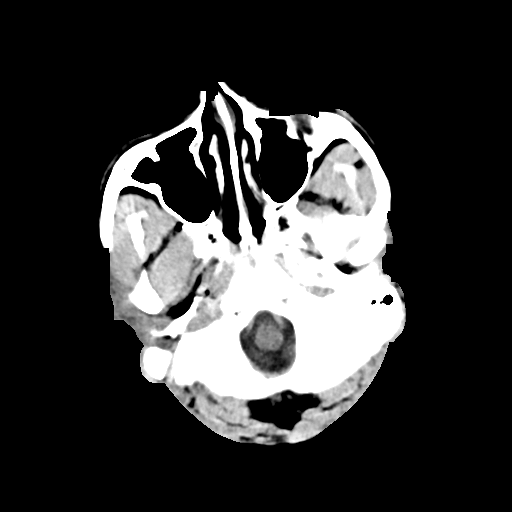
[im 4/31  bone]
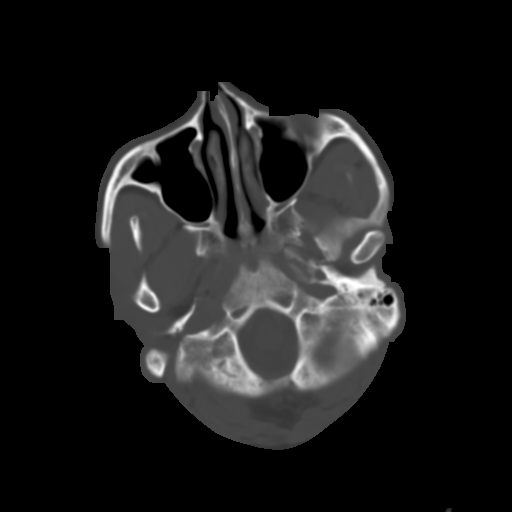
[im 8/31  brain]
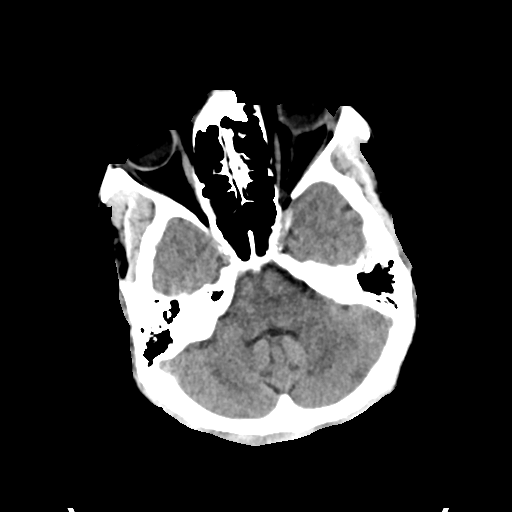
[im 12/31  brain]
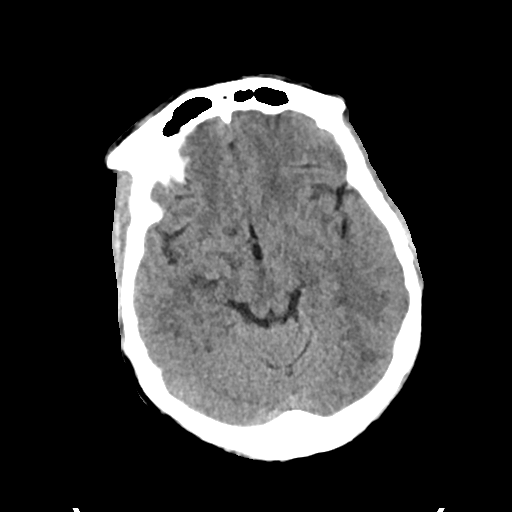
[im 16/31  brain]
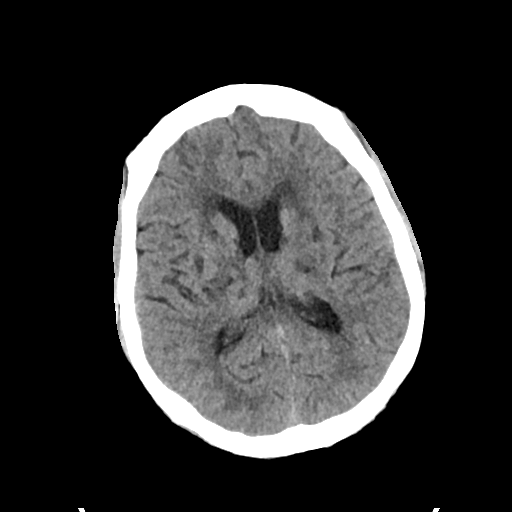
[im 19/31  brain]
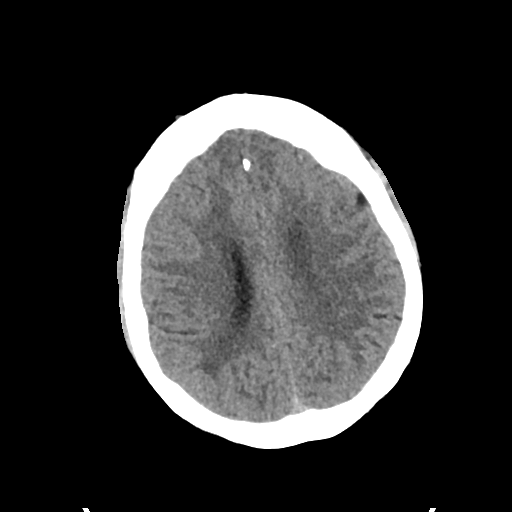
[im 19/31  bone]
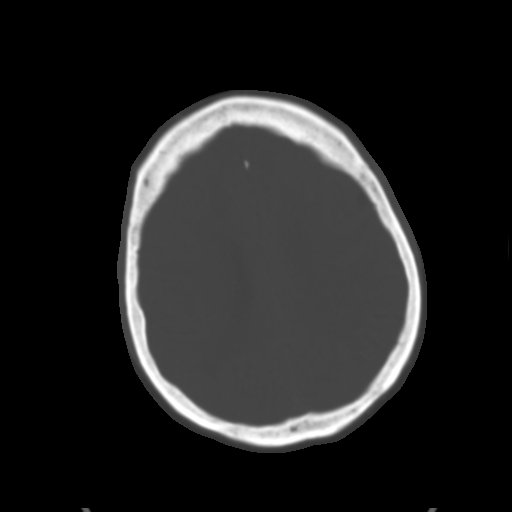
[im 23/31  brain]
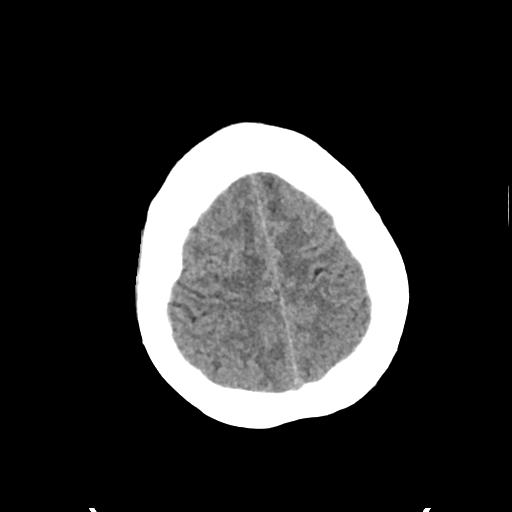
[im 27/31  brain]
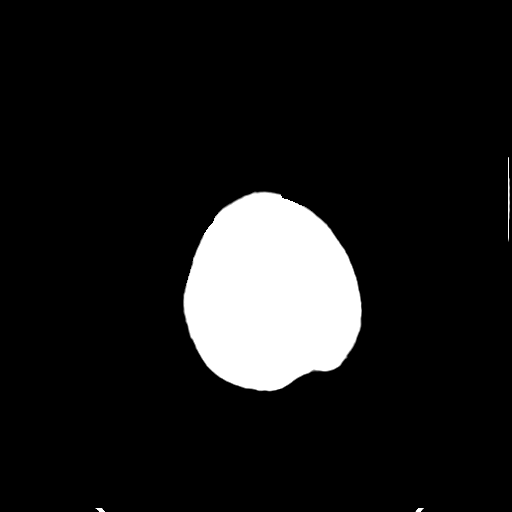

[Series 3: head bone · axial · 0.45mm/px · z∈[-166,-136]mm · 3 of 76 slices shown]
[im 8/76  bone]
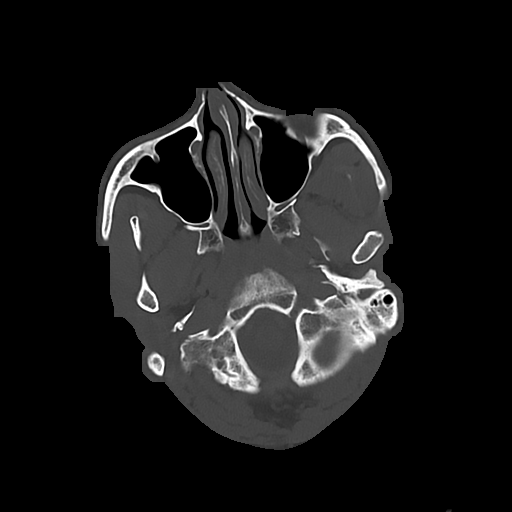
[im 16/76  bone]
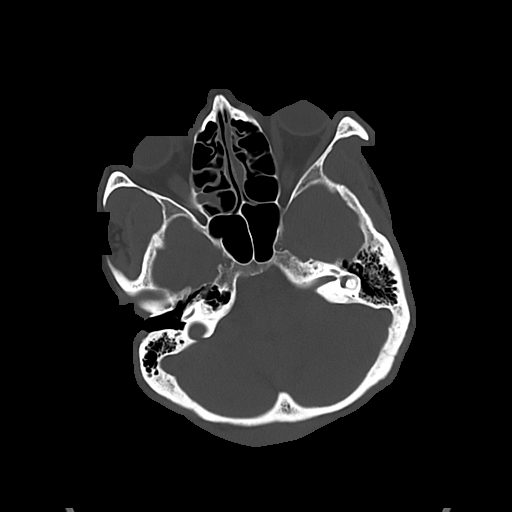
[im 23/76  bone]
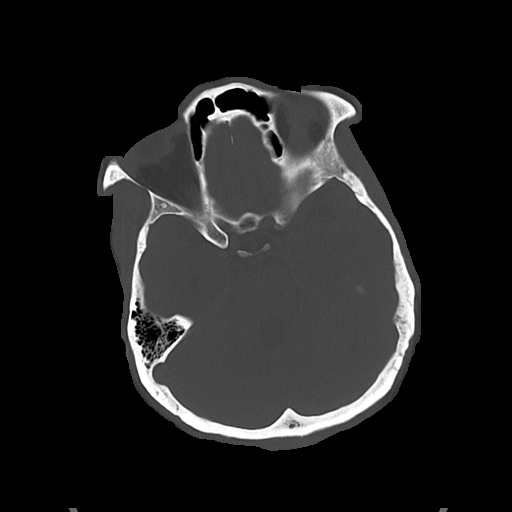

[Series 4: cor soft · coronal · 0.33mm/px · 3 of 69 slices shown]
[im 23/69  brain]
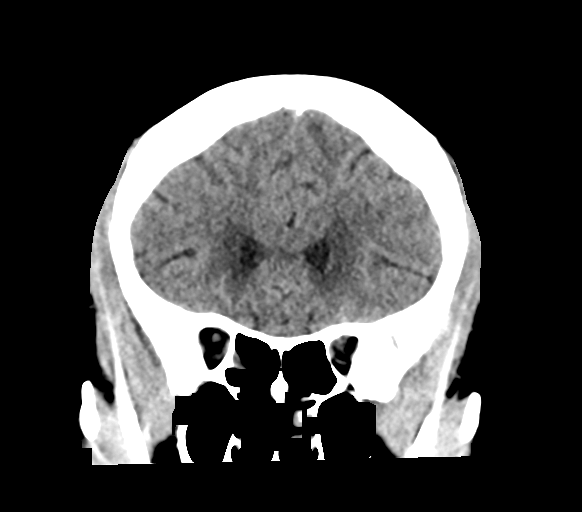
[im 31/69  brain]
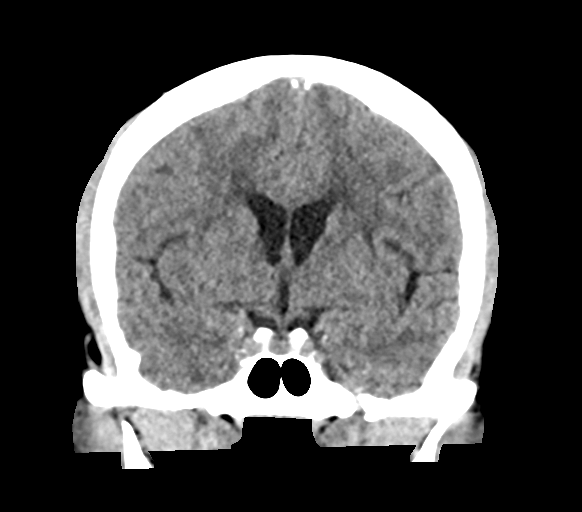
[im 38/69  brain]
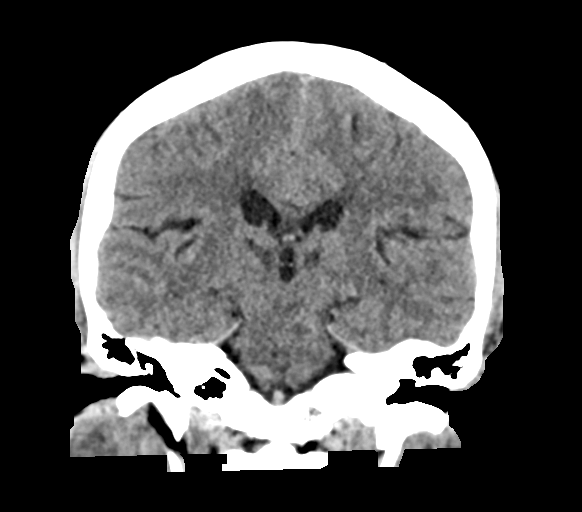

[Series 5: sag soft · sagittal · 0.33mm/px · 3 of 58 slices shown]
[im 20/58  brain]
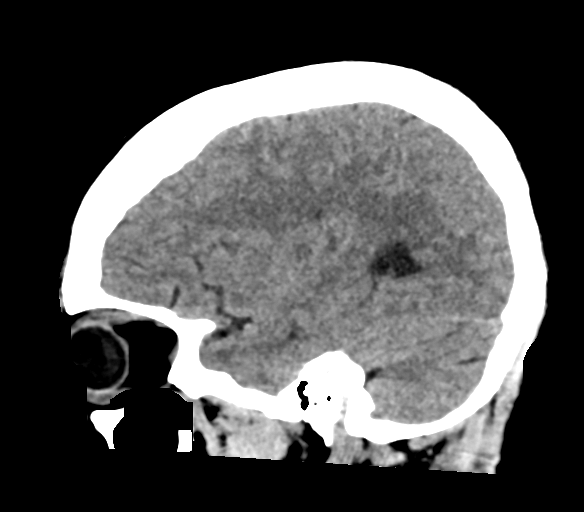
[im 29/58  brain]
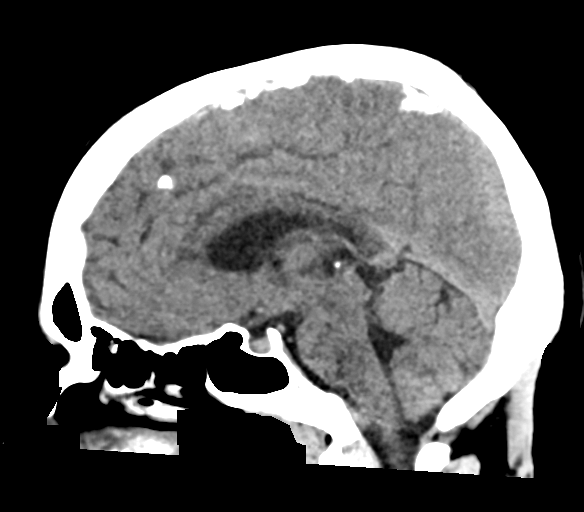
[im 39/58  brain]
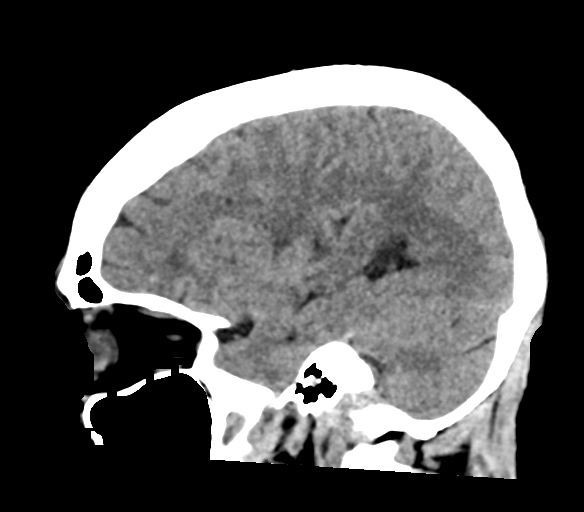

[16 of 47 positions shown; findings below may reference images not displayed]

FINDINGS: Brain: 4-5 mm hyperdense hemorrhage of the frontal operculum
adjacent to the left sylvian fissure (but appears intra-axial series
4, image 36) is more conspicuous since [DATE], but without
significant progression. No associated edema or mass effect.

Severe chronic small vessel disease with numerous chronic lacunar
infarcts in the brainstem and bilateral deep gray matter nuclei.
Punctate acute right lentiform involvement occult by CT.

No other acute intracranial hemorrhage. No cortically based acute
infarct identified. No intracranial mass effect or midline shift. No
ventriculomegaly.

Vascular: Mild Calcified atherosclerosis at the skull base. No
suspicious intracranial vascular hyperdensity.

Skull: No acute osseous abnormality identified. Hyperostosis of the
calvarium, normal variant.

Sinuses/Orbits: Visualized paranasal sinuses and mastoids are stable
and well aerated.

Other: Visualized orbits and scalp soft tissues are within normal
limits.
IMPRESSION: 1. Acute microhemorrhage of the left frontal operculum, increased in
conspicuity since [DATE] but not significantly progressed, and
with no edema or mass effect.

2. Otherwise stable CT brain with very severe small vessel disease.
No new intracranial abnormality.

## 2021-08-17 MED ORDER — CERTAVITE/ANTIOXIDANTS PO TABS
1.0000 | ORAL_TABLET | Freq: Every day | ORAL | 0 refills | Status: DC
  Filled 2021-08-17: qty 30, 30d supply, fill #0

## 2021-08-17 MED ORDER — LOSARTAN POTASSIUM 50 MG PO TABS
50.0000 mg | ORAL_TABLET | Freq: Every day | ORAL | 0 refills | Status: DC
  Filled 2021-08-17: qty 30, 30d supply, fill #0

## 2021-08-17 MED ORDER — CLOPIDOGREL BISULFATE 75 MG PO TABS
75.0000 mg | ORAL_TABLET | Freq: Every day | ORAL | 0 refills | Status: DC
  Filled 2021-08-17: qty 30, 30d supply, fill #0

## 2021-08-17 MED ORDER — AMLODIPINE BESYLATE 10 MG PO TABS
10.0000 mg | ORAL_TABLET | Freq: Every day | ORAL | 0 refills | Status: DC
  Filled 2021-08-17: qty 30, 30d supply, fill #0

## 2021-08-17 MED ORDER — ATORVASTATIN CALCIUM 80 MG PO TABS
80.0000 mg | ORAL_TABLET | Freq: Every day | ORAL | 0 refills | Status: DC
  Filled 2021-08-17: qty 30, 30d supply, fill #0

## 2021-08-17 NOTE — Progress Notes (Signed)
Speech Language Pathology Treatment: Cognitive-Linquistic  Patient Details Name: Roberta Calderon MRN: [id number] DOB: 1971/08/05 Today's Date: 08/17/2021 Time: 3009-2330 SLP Time Calculation (min) (ACUTE ONLY): 12 min  Assessment / Plan / Recommendation Clinical Impression  Brief session spent with Roberta Calderon, who is difficult to persuade to participate.  Her communication/speech is [employer].  She demonstrated improved functional use of call bell and phone; was able to problem-solve through reading menu and placing an order, but declined to make decision now about dinner because she might change her mind.  She requested time to sleep.  Affect continues to be flat and disinterest in further cognitive assessment is evident.  There are no overt communication/cognitive issues that are revealing themselves.  SLP service will sign off given these circumstances.      HPI HPI: Roberta Calderon is a 50 y.o. female  who presented to ER with complaint of headache and dizziness. MRI brain revealed a small right basal ganglia stroke.  MRI scan also shows several areas of microhemorrhages and in retrospect CT scan also shows tiny 2 to 3 mm left frontal subcortical microhemorrhage.  Pt with medical history significant for hypertension, hyperlipidemia, prior ICH and right thalamic infarction  She has a history of alcohol use, cocaine use and tobacco abuse.      SLP Plan  Education completed with pt. No SLP f/u      Recommendations for follow up therapy are one component of a multi-disciplinary discharge planning process, led by the attending physician.  Recommendations may be updated based on patient status, additional functional criteria and insurance authorization.    Recommendations                   Follow Up Recommendations: No SLP follow up Assistance recommended at discharge: PRN SLP Visit Diagnosis: Cognitive communication deficit (Q76.226) Plan: All goals met         Devonne Kitchen L.  Tivis Ringer, [place] CCC/SLP Acute Rehabilitation Services Office number [phone] Pager [phone]   Juan Quam Laurice  08/17/2021, 2:12 PM

## 2021-08-17 NOTE — Progress Notes (Signed)
Physical Therapy Treatment Patient Details Name: Roberta Calderon MRN: [id number] DOB: [birth year] Today's Date: 08/17/2021   History of Present Illness Roberta Calderon is a 50 y.o. female  who presented to ER with complaint of headache and dizziness. MRI brain revealed a small right basal ganglia stroke.  MRI scan also shows several areas of microhemorrhages and in retrospect CT scan also shows tiny 2 to 3 mm left frontal subcortical microhemorrhage. PMH: hypertension, hyperlipidemia, prior ICH and right thalamic infarction, She has a history of alcohol use, cocaine use and tobacco abuse.    PT Comments    Pt received seated EOB with bed alarm sounding, eager to get OOB and ambulate. Pt with good participation in community distance gait trial using RW needing min guard for safety due to impulsivity and decreased insight into deficits. Pt denies dizziness but did need to sit in hallway prior to returning to room. Pt also performed stair trial with Supervision and bilateral rails, no LOB. Pt continues to benefit from PT services to progress toward functional mobility goals.   Recommendations for follow up therapy are one component of a multi-disciplinary discharge planning process, led by the attending physician.  Recommendations may be updated based on patient status, additional functional criteria and insurance authorization.  Follow Up Recommendations  Home health PT (OPPT if HHPT not an option)     Assistance Recommended at Discharge Frequent or constant Supervision/Assistance  Equipment Recommendations  Rolling walker (2 wheels)    Recommendations for Other Services       Precautions / Restrictions Precautions Precautions: Fall Precaution Comments: Impulsive Restrictions Weight Bearing Restrictions: No     Mobility  Bed Mobility Overal bed mobility: Modified Independent             General bed mobility comments: pt seated EOB upon PTA arrival to room     Transfers Overall transfer level: Needs assistance Equipment used: Rolling walker (2 wheels) Transfers: Sit to/from Stand Sit to Stand: Supervision           General transfer comment: close supervision for safety; stood from bed and toilet; toileting ADLs independently    Ambulation/Gait Ambulation/Gait assistance: Min guard Gait Distance (Feet): 200 Feet (242ft, seated break, 170ft) Assistive device: Rolling walker (2 wheels) Gait Pattern/deviations: Step-through pattern;Decreased stride length Gait velocity: decreased     General Gait Details: min guard for safety with cues for RW; no overt LOB, pt c/o fatigue but not agreeable to sit until gait quality decreased; VSS per chart review, pt defer BP after gait trial   Stairs   Stairs assistance: Supervision Stair Management: Forwards;Alternating pattern;Two rails Number of Stairs: 7 (steps in PT gym) General stair comments: reciprocal pattern, reliant on B rails, no buckling or LOB   Wheelchair Mobility    Modified Rankin (Stroke Patients Only) Modified Rankin (Stroke Patients Only) Pre-Morbid Rankin Score: No symptoms Modified Rankin: Moderate disability     Balance Overall balance assessment: Needs assistance Sitting-balance support: Feet supported Sitting balance-Leahy Scale: Good     Standing balance support: No upper extremity supported;During functional activity;Bilateral upper extremity supported Standing balance-Leahy Scale: Fair Standing balance comment: RW to ambulate but could static stand and ADLs wtihout support            Cognition Arousal/Alertness: Awake/alert Behavior During Therapy: Flat affect Overall Cognitive Status: No family/caregiver present to determine baseline cognitive functioning         General Comments: Pt less flat today and does well with goal-directed therapy session,  pt c/o fatigue after stairs but could not be convinced to sit down and rest, then later agreeable to  sit on computer chair in hallway with encouragement due to apparent fatigue; poor insight into deficits.        Exercises      General Comments General comments (skin integrity, edema, etc.): pt self-directed and wants things done a particular way.      Pertinent Vitals/Pain Pain Assessment: Faces Pain Intervention(s): Monitored during session;Repositioned       Prior Function    PT Goals (current goals can now be found in the care plan section) Acute Rehab PT Goals Patient Stated Goal: to walk more PT Goal Formulation: Patient unable to participate in goal setting Time For Goal Achievement: 08/29/21 Progress towards PT goals: Progressing toward goals    Frequency    Min 4X/week      PT Plan Current plan remains appropriate    Co-evaluation              AM-PAC PT "6 Clicks" Mobility   Outcome Measure  Help needed turning from your back to your side while in a flat bed without using bedrails?: None Help needed moving from lying on your back to sitting on the side of a flat bed without using bedrails?: None Help needed moving to and from a bed to a chair (including a wheelchair)?: A Little Help needed standing up from a chair using your arms (e.g., wheelchair or bedside chair)?: A Little Help needed to walk in hospital room?: A Little Help needed climbing 3-5 steps with a railing? : A Little 6 Click Score: 20    End of Session Equipment Utilized During Treatment: Gait belt Activity Tolerance: Patient tolerated treatment well Patient left: in chair;with call bell/phone within reach;with chair alarm set Nurse Communication: Mobility status PT Visit Diagnosis: Other symptoms and signs involving the nervous system (R29.898)     Time: 6168-3729 PT Time Calculation (min) (ACUTE ONLY): 23 min  Charges:  $Gait Training: 23-37 mins                     Nakeita Styles P., PTA Acute Rehabilitation Services Pager: [phone] Office: [place] 08/17/2021, 5:34 PM

## 2021-08-17 NOTE — Progress Notes (Signed)
PT Cancellation Note  Patient Details Name: Roberta Calderon MRN: [id number] DOB: 30-Oct-1970   Cancelled Treatment:    Reason Eval/Treat Not Completed: (P) Other (comment) (pt defer PT session due to fatigue, pt appreciative of staff attempting later in day if time. Plan to try again after 2 or 3pm.) Will continue efforts per PT plan of care as schedule permits.   Kara Pacer Aladdin Kollmann 08/17/2021, 12:34 PM

## 2021-08-17 NOTE — Progress Notes (Signed)
PROGRESS NOTE  Roberta Calderon AVW:[id number] DOB: July 23, 1971 DOA: 08/13/2021 PCP: Patient, No Pcp Per (Inactive)   LOS: 3 days   Brief Narrative / Interim history: 50 year old female with medical history significant for poorly controlled HTN, HLD, prior ICH and ischemic strokes, polysubstance abuse (alcohol, cocaine and tobacco), medical noncompliance, initially presented to the ED with complaints of headache and dizziness.  During her initial work-up, she also complained of chest pain.  She ruled out for ACS or PE by negative high-sensitivity troponins x3 and negative CTA chest.  She then started complaining of dizziness and worsening weakness on her right side and inability to walk.  MRI brain was positive for acute right basal ganglia infarct.  Admitted for further evaluation and management.  Neurology/stroke MD consulted.  Follow-up CT head showed 5 mm acute hemorrhage.  Subjective / 24h Interval events: Does not talk much.  Persistent telling me that she is tired and wants a sleeping pill at 10:00 this morning.  She does tell me that she is homeless and was living with her foster mom up until current hospitalization however cannot go back.  Assessment & Plan: Principal Problem Acute stroke-Complicating prior history of stroke with residual right hemiparesis.  Currently has both ischemic and hemorrhagic.  Neurology/stroke MD consulted and follow-up appreciated.  Right lacunar basal ganglia infarct likely secondary to small vessel disease secondary to uncontrolled hypertension and hyperlipidemia.  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  CT head 12/18: Reported as no acute intracranial process.  MRI brain 12/19: 4 mm acute/early subacute lacunar infarct within the right basal ganglia.  Fairly numerous supratentorial and infratentorial chronic parenchymal microhemorrhages, in a distribution suggesting sequelae of chronic hypertensive  microangiopathy.  CTA head and neck 12/20: 5 mm area of hyperdensity just above the left sylvian fissure most compatible with acute hemorrhage, unchanged from 2 days prior.  Normal CTA head. 2D echo: LVEF 70-75% moderate LVH.  No intracardiac source of embolism detected on this TTE study.  Carotid ultrasound: Bilateral ICA 1-39% stenosis.  LDL 133 and A1c 6.6.  UDS positive for benzodiazepine, opiates and cocaine.  PT and OT recommend home health therapies.  SLP unable to evaluate due to mental status changes.  Not on antithrombotics prior to admission, Plavix that was initiated was discontinued due to hemorrhagic stroke.  Listed allergy to aspirin.  Control blood pressure less than 829 systolic, hold antiplatelets.  Repeat CT scan this morning stable.  Active Problems Hypertensive urgency/essential hypertension-Continue amlodipine 10 Mg daily and losartan 50 Mg daily.  Added as needed IV hydralazine for SBP >160.  Needing to control blood pressure better due to hemorrhage noted above.  No permissive hypertension indicated  Acute toxic encephalopathy-This could be due to withdrawal from alcohol versus cocaine versus hypertensive encephalopathy.  She is somnolent but engages more today and seems to be more alert.   Hyperlipidemia -LDL 133, goal <70.  Atorvastatin 80 mg daily.   Type II DM-A1c 6.6, goal <7.  Added SSI.   Polysubstance abuse (alcohol, tobacco, cocaine)-Continue CIWA protocol.  Reportedly did cocaine 2 days prior to admission, drinks alcohol most days of the week with reports of withdrawal symptoms when she does not drink for a few days.   Medical noncompliance-Reportedly has not seen a physician or taken meds for a while.  Needs to be counseled prior to discharge.   Atypical chest pain-Likely related to cocaine use.  Ruled out for MI, high-sensitivity troponin: 16 > 13 > 12.  CTA chest: No PE.  Bronchitic changes.  Homelessness-currently has no place to go.  Given substance abuse not  sure whether she will be accepted to a shelter.  TOC involved.  Scheduled Meds:  amLODipine  10 mg Oral Daily   atorvastatin  80 mg Oral Daily   folic acid  1 mg Oral Daily   insulin aspart  0-9 Units Subcutaneous TID WC   losartan  50 mg Oral Daily   multivitamin with minerals  1 tablet Oral Daily   nicotine  14 mg Transdermal Daily   thiamine  100 mg Oral Daily   Or   thiamine  100 mg Intravenous Daily   Continuous Infusions:  sodium chloride 10 mL/hr at 08/15/21 1803   PRN Meds:.acetaminophen **OR** acetaminophen (TYLENOL) oral liquid 160 mg/5 mL **OR** acetaminophen, hydrALAZINE, LORazepam **OR** LORazepam, senna-docusate  Diet Orders (From admission, onward)     Start     Ordered   08/15/21 1652  Diet heart healthy/carb modified Room service appropriate? Yes; Fluid consistency: Thin  Diet effective now       Question Answer Comment  Diet-HS Snack? Nothing   Room service appropriate? Yes   Fluid consistency: Thin      08/15/21 1652            DVT prophylaxis: SCD's Start: 08/14/21 2207     Code Status: Full Code  Family Communication: no family at bedside   Status is: Inpatient  Remains inpatient appropriate because: Homelessness  Level of care: Telemetry Medical  Consultants:  Neurology   Procedures:  2D echo  Microbiology  none  Antimicrobials: none    Objective: Vitals:   08/16/21 1922 08/17/21 0017 08/17/21 0333 08/17/21 0826  BP: (!) 153/93 (!) 154/93 (!) 155/92 (!) 147/81  Pulse: 95 95 88 (!) 106  Resp: 18 18 16    Temp: 98.4 F (36.9 C) 98.6 F (37 C) 99 F (37.2 C) 98.3 F (36.8 C)  TempSrc: Oral Oral    SpO2: 98% 94% 99% 96%  Weight:        Intake/Output Summary (Last 24 hours) at 08/17/2021 1014 Last data filed at 08/17/2021 0900 Gross per 24 hour  Intake 120 ml  Output --  Net 120 ml    Filed Weights   08/14/21 2126  Weight: 56.1 kg    Examination:  Constitutional: No distress Eyes: Anicteric ENMT: mmm Neck:  normal, supple Respiratory: CTA bilaterally, no wheezing, no crackles, normal respiratory effort Cardiovascular: Regular rate and rhythm, no murmurs, Abdomen: Soft, NT, ND, bowel sounds positive Musculoskeletal: no clubbing / cyanosis.  Skin: No rashes seen Neurologic: Nonfocal   Data Reviewed: I have independently reviewed following labs and imaging studies   CBC: Recent Labs  Lab 08/13/21 2307  WBC 6.3  NEUTROABS 2.9  HGB 14.3  HCT 46.4*  MCV 81.3  PLT 269    Basic Metabolic Panel: Recent Labs  Lab 08/13/21 2307  NA 137  K 3.7  CL 104  CO2 25  GLUCOSE 215*  BUN 12  CREATININE 0.77  CALCIUM 9.0    Liver Function Tests: Recent Labs  Lab 08/13/21 2307  AST 24  ALT 14  ALKPHOS 87  BILITOT 0.5  PROT 7.0  ALBUMIN 3.6    Coagulation Profile: Recent Labs  Lab 08/13/21 2307  INR 1.0    HbA1C: Recent Labs    08/15/21 0843  HGBA1C 6.6*    CBG: Recent Labs  Lab 08/15/21 2124 08/16/21 1324 08/16/21 1745 08/16/21 2201 08/17/21  0615  GLUCAP 112* 201* 152* 155* 129*     Recent Results (from the past 240 hour(s))  Resp Panel by RT-PCR (Flu A&B, Covid) Nasopharyngeal Swab     Status: None   Collection Time: 08/14/21  7:27 PM   Specimen: Nasopharyngeal Swab; Nasopharyngeal(NP) swabs in vial transport medium  Result Value Ref Range Status   SARS Coronavirus 2 by RT PCR NEGATIVE NEGATIVE Final    Comment: (NOTE) SARS-CoV-2 target nucleic acids are NOT DETECTED.  The SARS-CoV-2 RNA is generally detectable in upper respiratory specimens during the acute phase of infection. The lowest concentration of SARS-CoV-2 viral copies this assay can detect is 138 copies/mL. A negative result does not preclude SARS-Cov-2 infection and should not be used as the sole basis for treatment or other patient management decisions. A negative result may occur with  improper specimen collection/handling, submission of specimen other than nasopharyngeal swab, presence  of viral mutation(s) within the areas targeted by this assay, and inadequate number of viral copies(<138 copies/mL). A negative result must be combined with clinical observations, patient history, and epidemiological information. The expected result is Negative.  Fact Sheet for Patients:  EntrepreneurPulse.com.au  Fact Sheet for Healthcare Providers:  IncredibleEmployment.be  This test is no t yet approved or cleared by the [place] FDA and  has been authorized for detection and/or diagnosis of SARS-CoV-2 by FDA under an Emergency Use Authorization (EUA). This EUA will remain  in effect (meaning this test can be used) for the duration of the COVID-19 declaration under Section 564(b)(1) of the Act, 21 U.S.C.section 360bbb-3(b)(1), unless the authorization is terminated  or revoked sooner.       Influenza A by PCR NEGATIVE NEGATIVE Final   Influenza B by PCR NEGATIVE NEGATIVE Final    Comment: (NOTE) The Xpert Xpress SARS-CoV-2/FLU/RSV plus assay is intended as an aid in the diagnosis of influenza from Nasopharyngeal swab specimens and should not be used as a sole basis for treatment. Nasal washings and aspirates are unacceptable for Xpert Xpress SARS-CoV-2/FLU/RSV testing.  Fact Sheet for Patients: EntrepreneurPulse.com.au  Fact Sheet for Healthcare Providers: IncredibleEmployment.be  This test is not yet approved or cleared by the [place] FDA and has been authorized for detection and/or diagnosis of SARS-CoV-2 by FDA under an Emergency Use Authorization (EUA). This EUA will remain in effect (meaning this test can be used) for the duration of the COVID-19 declaration under Section 564(b)(1) of the Act, 21 U.S.C. section 360bbb-3(b)(1), unless the authorization is terminated or revoked.  Performed at [facility], [place] [address] [place], [place] [address]        Radiology Studies: CT HEAD WO CONTRAST (5MM)  Result Date: 08/17/2021 CLINICAL DATA:  50 year old female punctate right lentiform lacunar infarct on MRI with severe underlying chronic small vessel disease. Trace hemorrhage left sylvian fissure on CT. EXAM: CT HEAD WITHOUT CONTRAST TECHNIQUE: Contiguous axial images were obtained from the base of the skull through the vertex without intravenous contrast. COMPARISON:  Brain MRI 08/14/2021. CT head 02/06/2021 and 08/13/2021. FINDINGS: Brain: 4-5 mm hyperdense hemorrhage of the frontal operculum adjacent to the left sylvian fissure (but appears intra-axial series 4, image 36) is more conspicuous since 08/13/2021, but without significant progression. No associated edema or mass effect. Severe chronic small vessel disease with numerous chronic lacunar infarcts in the brainstem and bilateral deep gray matter nuclei. Punctate acute right lentiform involvement occult by CT. No other acute intracranial hemorrhage. No cortically based acute infarct identified. No intracranial mass  effect or midline shift. No ventriculomegaly. Vascular: Mild Calcified atherosclerosis at the skull base. No suspicious intracranial vascular hyperdensity. Skull: No acute osseous abnormality identified. Hyperostosis of the calvarium, normal variant. Sinuses/Orbits: Visualized paranasal sinuses and mastoids are stable and well aerated. Other: Visualized orbits and scalp soft tissues are within normal limits. IMPRESSION: 1. Acute microhemorrhage of the left frontal operculum, increased in conspicuity since 08/13/2021 but not significantly progressed, and with no edema or mass effect. 2. Otherwise stable CT brain with very severe small vessel disease. No new intracranial abnormality. Electronically Signed   By: Genevie Ann M.D.   On: 08/17/2021 07:21     Marzetta Board, MD, PhD [facility]  Between 7 am - 7 pm I am available, please contact me via Amion (for emergencies) or Securechat  (non urgent messages)  Between 7 pm - 7 am I am not available, please contact night coverage MD/APP via Amion

## 2021-08-17 NOTE — Discharge Summary (Addendum)
Physician Discharge Summary  Roberta Calderon TMA:[id number] DOB: Dec 07, 1970 DOA: 08/13/2021  PCP: Patient, No Pcp Per (Inactive)  Admit date: 08/13/2021 Discharge date: 08/18/2021  Admitted From: home Disposition:  home (shelter)  Recommendations for Outpatient Follow-up:  Follow up with PCP in 1-2 weeks Follow up with Neurology in 4 weeks  Home Health: PT Equipment/Devices: none  Discharge Condition: stable CODE STATUS: Full code Diet recommendation: heart healthy  HPI: Per admitting MD, Roberta Calderon is a 50 y.o. female with medical history significant for hypertension, hyperlipidemia, prior ICH and right thalamic infarction who presented to ER with complaint of headache and dizziness. During her initial workup she then complained of chest pain. She had a complete cardiac workup that was negative.  She had negative troponins x3 and a negative CT angiography of her chest.  She then started to complain of dizziness and weakness on her right side.  She then underwent an MRI with the plan of to discharge if it was negative but it came back positive for an acute right basal ganglia infarct.  She has a history of alcohol use, cocaine use and tobacco abuse.  She states she last drank alcohol and used cocaine 2 days ago.  She reports she has not been taking her medications for her high blood pressure or cholesterol for the last few months.  Not followed up with any physician to get refills of her medications.  She reports that her headache resolved while she was in the emergency room.  She stated that she felt her right arm and leg were very weak and it was difficult for her to walk.  She states she did not fall.  She denies any head trauma.  She denies any facial numbness or change in her vision. She smokes 1/2 to 1 pack of cigarettes a day.  Drinks alcohol most days of the week she states.  Uses cocaine regularly.  Hospital Course / Discharge diagnoses: Principal Problem Acute  stroke-Complicating prior history of stroke with residual right hemiparesis.  Currently has both ischemic and hemorrhagic.  Neurology/stroke MD consulted and follow-up appreciated.  Right lacunar basal ganglia infarct likely secondary to small vessel disease secondary to uncontrolled hypertension and hyperlipidemia.  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  CT head 12/18: Reported as no acute intracranial process.  MRI brain 12/19: 4 mm acute/early subacute lacunar infarct within the right basal ganglia.  Fairly numerous supratentorial and infratentorial chronic parenchymal microhemorrhages, in a distribution suggesting sequelae of chronic hypertensive microangiopathy.  CTA head and neck 12/20: 5 mm area of hyperdensity just above the left sylvian fissure most compatible with acute hemorrhage, unchanged from 2 days prior.  Normal CTA head. 2D echo: LVEF 70-75% moderate LVH.  No intracardiac source of embolism detected on this TTE study.  Carotid ultrasound: Bilateral ICA 1-39% stenosis.  LDL 133 and A1c 6.6.  UDS positive for benzodiazepine, opiates and cocaine.  PT and OT recommend home health therapies.   Not on antithrombotics prior to admission, Plavix that was initiated was discontinued due to hemorrhagic stroke.  Listed allergy to aspirin.  Repeat  CT scan was stable, discussed with [name] with neurology, resume Plavix   Active Problems Hypertensive urgency/essential hypertension-Continue amlodipine 10 Mg daily and losartan 50 Mg daily.  Blood pressure less than 256 systolic.  Counseled compliance Acute toxic encephalopathy-This could be due to withdrawal from alcohol versus cocaine versus hypertensive encephalopathy.  This is resolved  Hyperlipidemia -LDL 133,  goal <70.  Atorvastatin 80 mg daily. Type II DM-A1c 6.6, goal <7.  Polysubstance abuse (alcohol, tobacco, cocaine)- Reportedly did cocaine 2 days prior to admission, drinks alcohol most days  of the week with reports of withdrawal symptoms when she does not drink for a few days.  No significant withdrawal seizure  Medical noncompliance-Reportedly has not seen a physician or taken meds for a while.  Counseled Atypical chest pain-Likely related to cocaine use.  Ruled out for MI, high-sensitivity troponin: 16 > 13 > 12.  CTA chest: No PE.  Bronchitic changes. Homelessness-will live in a shelter  Sepsis ruled out   Discharge Instructions   Allergies as of 08/18/2021       Reactions   Nsaids Nausea And Vomiting, Rash   Aspirin Hives        Medication List     STOP taking these medications    lisinopril 40 MG tablet Commonly known as: ZESTRIL       TAKE these medications    amLODipine 10 MG tablet Commonly known as: NORVASC Take 1 tablet (10 mg total) by mouth daily.   atorvastatin 80 MG tablet Commonly known as: Lipitor Take 1 tablet (80 mg total) by mouth daily.   CertaVite/Antioxidants Tabs Take 1 tablet by mouth daily.   clopidogrel 75 MG tablet Commonly known as: Plavix Take 1 tablet (75 mg total) by mouth daily.   losartan 50 MG tablet Commonly known as: COZAAR Take 1 tablet (50 mg total) by mouth daily.               Durable Medical Equipment  (From admission, onward)           Start     Ordered   08/15/21 1411  For home use only DME 3 n 1  Once        08/15/21 1410   08/15/21 1411  For home use only DME Walker rolling  Once       Question Answer Comment  Walker: With [place] Wheels   Patient needs a walker to treat with the following condition Stroke ([place])      08/15/21 1410            Follow-up Information     [facility] Follow up on 09/12/2021.   Specialty: Family Medicine Why: Your appointment is at 9:30 am. please arrive early and bring a picture ID and your current medications. Contact information: [place] [address] [facility].   Why: please use this location for your pharmacy needs. Contact information: [address] [place] [address] [phone]                Consultations: Neurology   Procedures/Studies:  CT ANGIO HEAD W OR WO CONTRAST  Addendum Date: 08/15/2021   ADDENDUM REPORT: 08/15/2021 14:04 ADDENDUM: These results were called by telephone at the time of interpretation on 08/15/2021 at 1:58 pm to provider Hongarli , who verbally acknowledged these results. Electronically Signed   By: Franchot Gallo M.D.   On: 08/15/2021 14:04   Result Date: 08/15/2021 CLINICAL DATA:  Follow-up stroke EXAM: CT ANGIOGRAPHY HEAD TECHNIQUE: Multidetector CT imaging of the head was performed using the standard protocol during bolus administration of intravenous contrast. Multiplanar CT image reconstructions and MIPs were obtained to evaluate the vascular anatomy. CONTRAST:  47mL OMNIPAQUE IOHEXOL 350 MG/ML SOLN  COMPARISON:  CT head 08/13/2021, 02/07/2021.  MRI head 08/14/2021 FINDINGS: CT HEAD Brain: 5 mm hyperdensity just above the left sylvian fissure most compatible with recent hemorrhage. This was present on the CT 2 days ago but was not present on the CT from June 2022. This shows susceptibility on MRI yesterday. No other areas of acute hemorrhage. Moderate chronic microvascular ischemic change in the white matter, basal ganglia and thalamus bilaterally and in the pons. Negative for mass lesion. Ventricle size normal. Vascular: Negative for hyperdense vessel Skull: Negative Sinuses: Mild mucosal edema paranasal sinuses.  Negative orbit Other: None CTA HEAD Anterior circulation: Internal carotid artery widely patent through the cavernous segment. Anterior and middle cerebral arteries normal bilaterally. No stenosis, occlusion, or aneurysm. Posterior circulation: Both vertebral arteries widely patent to the basilar. AICA patent bilaterally. Basilar  widely patent. Superior cerebellar and posterior cerebral arteries normal bilaterally. Venous sinuses: Normal venous enhancement Anatomic variants: None Review of the MIP images confirms the above findings. IMPRESSION: 1. CT head demonstrates a 5 mm area of hyperdensity just above the left sylvian fissure most compatible with acute hemorrhage. This is unchanged from 2 days prior. 2. No acute ischemic infarct. Moderate to extensive chronic microvascular ischemia. 3. Normal CTA head Electronically Signed: By: Franchot Gallo M.D. On: 08/15/2021 13:48   CT HEAD WO CONTRAST (5MM)  Result Date: 08/17/2021 CLINICAL DATA:  49 year old female punctate right lentiform lacunar infarct on MRI with severe underlying chronic small vessel disease. Trace hemorrhage left sylvian fissure on CT. EXAM: CT HEAD WITHOUT CONTRAST TECHNIQUE: Contiguous axial images were obtained from the base of the skull through the vertex without intravenous contrast. COMPARISON:  Brain MRI 08/14/2021. CT head 02/06/2021 and 08/13/2021. FINDINGS: Brain: 4-5 mm hyperdense hemorrhage of the frontal operculum adjacent to the left sylvian fissure (but appears intra-axial series 4, image 36) is more conspicuous since 08/13/2021, but without significant progression. No associated edema or mass effect. Severe chronic small vessel disease with numerous chronic lacunar infarcts in the brainstem and bilateral deep gray matter nuclei. Punctate acute right lentiform involvement occult by CT. No other acute intracranial hemorrhage. No cortically based acute infarct identified. No intracranial mass effect or midline shift. No ventriculomegaly. Vascular: Mild Calcified atherosclerosis at the skull base. No suspicious intracranial vascular hyperdensity. Skull: No acute osseous abnormality identified. Hyperostosis of the calvarium, normal variant. Sinuses/Orbits: Visualized paranasal sinuses and mastoids are stable and well aerated. Other: Visualized orbits and  scalp soft tissues are within normal limits. IMPRESSION: 1. Acute microhemorrhage of the left frontal operculum, increased in conspicuity since 08/13/2021 but not significantly progressed, and with no edema or mass effect. 2. Otherwise stable CT brain with very severe small vessel disease. No new intracranial abnormality. Electronically Signed   By: Genevie Ann M.D.   On: 08/17/2021 07:21   CT HEAD WO CONTRAST (5MM)  Result Date: 08/13/2021 CLINICAL DATA:  Dizziness, nonspecific EXAM: CT HEAD WITHOUT CONTRAST TECHNIQUE: Contiguous axial images were obtained from the base of the skull through the vertex without intravenous contrast. COMPARISON:  02/07/2021 FINDINGS: Brain: No evidence of acute infarction, hemorrhage, cerebral edema, mass, mass effect, or midline shift. No hydrocephalus or extra-axial fluid collection. Previously noted hyperdense material in the left pons is no longer seen, with mild hypodensity there, likely sequela of prior hypertensive microhemorrhage. Redemonstrated lacunar infarcts in the bilateral thalami and basal ganglia. Periventricular white matter changes, likely the sequela of chronic small vessel ischemic disease. Vascular: No hyperdense vessel. Skull: Normal. Negative for fracture or focal  lesion. Sinuses/Orbits: Mild mucosal thickening in the posterior right ethmoid air cells. The orbits are unremarkable. Other: The mastoid air cells are well aerated. IMPRESSION: IMPRESSION No acute intracranial process. Electronically Signed   By: Merilyn Baba M.D.   On: 08/13/2021 23:23   CT Angio Chest PE W and/or Wo Contrast  Result Date: 08/14/2021 CLINICAL DATA:  Dizziness, hypertensive, high clinical suspicion of pulmonary embolism EXAM: CT ANGIOGRAPHY CHEST WITH CONTRAST TECHNIQUE: Multidetector CT imaging of the chest was performed using the standard protocol during bolus administration of intravenous contrast. Multiplanar CT image reconstructions and MIPs were obtained to evaluate the  vascular anatomy. CONTRAST:  157mL OMNIPAQUE IOHEXOL 350 MG/ML SOLN IV COMPARISON:  None FINDINGS: Cardiovascular: Atherosclerotic calcifications aorta, proximal great vessels, and coronary arteries. Aorta normal caliber without aneurysm or dissection. Heart chambers normal size. No pericardial effusion. Pulmonary arteries adequately opacified and patent. No evidence of pulmonary embolism. Mediastinum/Nodes: Base of cervical region normal appearance. No thoracic adenopathy. Esophagus unremarkable. Lungs/Pleura: Mild dependent atelectasis in the posterior lungs. Lungs otherwise clear. Central peribronchial thickening. No segmental infiltrate, pleural effusion, or pneumothorax. Upper Abdomen: No upper abdominal abnormalities Musculoskeletal: Osseous structures unremarkable. Review of the MIP images confirms the above findings. IMPRESSION: No evidence of pulmonary embolism. Bronchitic changes with dependent atelectasis in the posterior lower lobes. Scattered atherosclerotic calcifications including coronary arteries. Aortic Atherosclerosis (ICD10-I70.0). Electronically Signed   By: Lavonia Dana M.D.   On: 08/14/2021 13:35   MR BRAIN WO CONTRAST  Result Date: 08/14/2021 CLINICAL DATA:  Provided history: Neuro deficit, acute, stroke suspected. EXAM: MRI HEAD WITHOUT CONTRAST TECHNIQUE: Multiplanar, multiecho pulse sequences of the brain and surrounding structures were obtained without intravenous contrast. COMPARISON:  Noncontrast head CT 08/13/2021. CT angiogram head/neck 02/07/2021. And MRI 04/08/2020 FINDINGS: Brain: Mild-to-moderate intermittent motion degradation. Cerebral volume is normal for age. 4 mm focus of restricted diffusion within the right lentiform nucleus, compatible with acute/subacute infarct. Multiple chronic lacunar infarcts within the bilateral basal ganglia/internal capsule and thalami, as well as pons. Background moderate multifocal T2 FLAIR hyperintense signal abnormality within the cerebral  white matter and pons, nonspecific but compatible with chronic small vessel ischemic disease. Chronic small vessel ischemic changes are also present within the right middle cerebellar peduncle. Small chronic infarct within the left cerebellar hemisphere. Fairly numerous supratentorial and infratentorial chronic parenchymal microhemorrhages with a central and posterior fossa were dominance, likely reflecting sequela of hypertensive microangiopathy. No evidence of an intracranial mass No extra-axial fluid collection. No midline shift. Vascular: Maintained flow voids within the proximal large arterial vessels. Skull and upper cervical spine: No focal suspicious marrow lesion. Incompletely assessed cervical spondylosis. Sinuses/Orbits: Visualized orbits show no acute finding. Mild mucosal thickening within the paranasal sinuses, greatest within the posterior right ethmoid air cells and left maxillary sinus. IMPRESSION: Motion degraded exam. 4 mm acute/early subacute lacunar infarct within the right basal ganglia. Multiple chronic lacunar infarcts within the bilateral basal ganglia/internal capsules and thalami, as well as pons. Background moderate chronic small vessel ischemic changes within the cerebral white matter and pons. Chronic small-vessel ischemic changes are also present within the right middle cerebellar peduncle. Small chronic infarct within the left cerebellar hemisphere. Fairly numerous supratentorial and infratentorial chronic parenchymal microhemorrhages, in a distribution suggesting sequela of chronic hypertensive microangiopathy. Mild paranasal sinus disease, as described. Electronically Signed   By: Kellie Simmering D.O.   On: 08/14/2021 18:46   ECHOCARDIOGRAM COMPLETE  Result Date: 08/15/2021    ECHOCARDIOGRAM REPORT   Patient Name:   Eliezer Lofts  Pettigrew Date of Exam: 08/15/2021 Medical Rec #:  [id number]          Height:       62.0 in Accession #:    [id number]         Weight:       123.7 lb Date of  Birth:  03/02/1971           BSA:          1.558 m Patient Age:    50 years           BP:           158/90 mmHg Patient Gender: F                  HR:           100 bpm. Exam Location:  Inpatient Procedure: 2D Echo, 3D Echo, Cardiac Doppler and Color Doppler Indications:    Stroke I63.9  History:        Patient has prior history of Echocardiogram examinations, most                 recent 02/08/2021. Risk Factors:Hypertension, Dyslipidemia and                 Current Smoker. Chest pain, headache, dizziness, weakness of                 right side. Cocaine abuse.  Sonographer:    Darlina Sicilian RDCS Referring Phys: [id number] Eben Burow  Sonographer Comments: Global longitudinal strain was attempted. IMPRESSIONS  1. Left ventricular ejection fraction, by estimation, is 70 to 75%. The left ventricle has hyperdynamic function. The left ventricle has no regional wall motion abnormalities. There is moderate concentric left ventricular hypertrophy. Left ventricular diastolic parameters are indeterminate.  2. Right ventricular systolic function is normal. The right ventricular size is normal. Tricuspid regurgitation signal is inadequate for assessing PA pressure.  3. The mitral valve is normal in structure. Trivial mitral valve regurgitation. No evidence of mitral stenosis.  4. The aortic valve is tricuspid. Aortic valve regurgitation is not visualized. No aortic stenosis is present.  5. The inferior vena cava is normal in size with greater than 50% respiratory variability, suggesting right atrial pressure of 3 mmHg. Comparison(s): Prior images reviewed side by side. Conclusion(s)/Recommendation(s): No intracardiac source of embolism detected on this transthoracic study. Consider a transesophageal echocardiogram to exclude cardiac source of embolism if clinically indicated. LV mid cavitary gradient 70mmHg. FINDINGS  Left Ventricle: Left ventricular ejection fraction, by estimation, is 70 to 75%. The left ventricle has  hyperdynamic function. The left ventricle has no regional wall motion abnormalities. Global longitudinal strain performed but not reported based on interpreter judgement due to suboptimal tracking. The left ventricular internal cavity size was small. There is moderate concentric left ventricular hypertrophy. Left ventricular diastolic parameters are indeterminate. Right Ventricle: The right ventricular size is normal. No increase in right ventricular wall thickness. Right ventricular systolic function is normal. Tricuspid regurgitation signal is inadequate for assessing PA pressure. Left Atrium: Left atrial size was normal in size. Right Atrium: Right atrial size was normal in size. Pericardium: There is no evidence of pericardial effusion. Mitral Valve: The mitral valve is normal in structure. Trivial mitral valve regurgitation. No evidence of mitral valve stenosis. Tricuspid Valve: The tricuspid valve is not well visualized. Tricuspid valve regurgitation is not demonstrated. No evidence of tricuspid stenosis. Aortic Valve: The aortic valve is tricuspid. Aortic valve regurgitation is not visualized.  No aortic stenosis is present. Pulmonic Valve: The pulmonic valve was not well visualized. Pulmonic valve regurgitation is not visualized. Aorta: The aortic root, ascending aorta, aortic arch and descending aorta are all structurally normal, with no evidence of dilitation or obstruction. Venous: The inferior vena cava is normal in size with greater than 50% respiratory variability, suggesting right atrial pressure of 3 mmHg. IAS/Shunts: The atrial septum is grossly normal.  LEFT VENTRICLE PLAX 2D LVIDd:         3.50 cm   Diastology LVIDs:         1.90 cm   LV e' medial:    5.26 cm/s LV PW:         1.47 cm   LV E/e' medial:  11.5 LV IVS:        1.60 cm   LV e' lateral:   5.52 cm/s LVOT diam:     1.90 cm   LV E/e' lateral: 10.9 LV SV:         65 LV SV Index:   41 LVOT Area:     2.84 cm                           3D Volume  EF:                          3D EF:        63 %                          LV EDV:       73 ml                          LV ESV:       27 ml                          LV SV:        46 ml RIGHT VENTRICLE RV S prime:     14.80 cm/s TAPSE (M-mode): 2.3 cm LEFT ATRIUM             Index        RIGHT ATRIUM           Index LA diam:        3.00 cm 1.92 cm/m   RA Area:     11.10 cm LA Vol (A2C):   36.3 ml 23.29 ml/m  RA Volume:   24.40 ml  15.66 ml/m LA Vol (A4C):   35.4 ml 22.71 ml/m LA Biplane Vol: 36.0 ml 23.10 ml/m  AORTIC VALVE LVOT Vmax:   117.00 cm/s LVOT Vmean:  79.300 cm/s LVOT VTI:    0.228 m  AORTA Ao Root diam: 2.80 cm Ao Asc diam:  3.10 cm MITRAL VALVE MV Area (PHT): 1.68 cm    SHUNTS MV Decel Time: 452 msec    Systemic VTI:  0.23 m MV E velocity: 60.30 cm/s  Systemic Diam: 1.90 cm MV A velocity: 99.00 cm/s MV E/A ratio:  0.61 Buford Dresser MD Electronically signed by Buford Dresser MD Signature Date/Time: 08/15/2021/12:37:27 PM    Final    VAS US CAROTID (at [facility] and [facility] only)  Result Date: 08/17/2021 Carotid Arterial Duplex Study Patient Name:  ARTIS BUECHELE  Date of Exam:   08/15/2021 Medical Rec #: [id number]  Accession #:    [id number] Date of Birth: 13-Jan-1971            Patient Gender: F Patient Age:   85 years Exam Location:  [facility] Procedure:      VAS US CAROTID Referring Phys: Harrold Donath --------------------------------------------------------------------------------  Indications:       CVA. Risk Factors:      Hypertension. Comparison Study:  02/07/21 prior Performing Technologist: Archie Patten RVS  Examination Guidelines: A complete evaluation includes B-mode imaging, spectral Doppler, color Doppler, and power Doppler as needed of all accessible portions of each vessel. Bilateral testing is considered an integral part of a complete examination. Limited examinations for reoccurring indications may be performed as noted.  Right Carotid Findings:  +----------+--------+--------+--------+------------------+--------+             PSV cm/s EDV cm/s Stenosis Plaque Description Comments  +----------+--------+--------+--------+------------------+--------+  CCA Prox   83       19                heterogenous                 +----------+--------+--------+--------+------------------+--------+  CCA Distal 54       17                heterogenous                 +----------+--------+--------+--------+------------------+--------+  ICA Prox   75       28       1-39%    heterogenous                 +----------+--------+--------+--------+------------------+--------+  ICA Distal 98       33                                             +----------+--------+--------+--------+------------------+--------+  ECA        95       21                                             +----------+--------+--------+--------+------------------+--------+ +----------+--------+-------+--------+-------------------+             PSV cm/s EDV cms Describe Arm Pressure (mmHG)  +----------+--------+-------+--------+-------------------+  Subclavian 103                                            +----------+--------+-------+--------+-------------------+ +---------+--------+--+--------+--+---------+  Vertebral PSV cm/s 65 EDV cm/s 15 Antegrade  +---------+--------+--+--------+--+---------+  Left Carotid Findings: +----------+--------+--------+--------+------------------+--------+             PSV cm/s EDV cm/s Stenosis Plaque Description Comments  +----------+--------+--------+--------+------------------+--------+  CCA Prox   72       15                heterogenous                 +----------+--------+--------+--------+------------------+--------+  CCA Distal 59       19                heterogenous                 +----------+--------+--------+--------+------------------+--------+  ICA Prox   59       22       1-39%    heterogenous                  +----------+--------+--------+--------+------------------+--------+  ICA Distal 93       29                                             +----------+--------+--------+--------+------------------+--------+  ECA        67       15                                             +----------+--------+--------+--------+------------------+--------+ +----------+--------+--------+--------+-------------------+             PSV cm/s EDV cm/s Describe Arm Pressure (mmHG)  +----------+--------+--------+--------+-------------------+  Subclavian 125                                             +----------+--------+--------+--------+-------------------+ +---------+--------+---+--------+--+---------+  Vertebral PSV cm/s 110 EDV cm/s 29 Antegrade  +---------+--------+---+--------+--+---------+   Summary: Right Carotid: Velocities in the right ICA are consistent with a 1-39% stenosis. Left Carotid: Velocities in the left ICA are consistent with a 1-39% stenosis. Vertebrals: Bilateral vertebral arteries demonstrate antegrade flow. *See table(s) above for measurements and observations.  Electronically signed by Antony Contras MD on 08/17/2021 at 12:42:59 PM.    Final      Subjective: - no chest pain, shortness of breath, no abdominal pain, nausea or vomiting.   Discharge Exam: BP (!) 124/104 (BP Location: Right Arm)    Pulse 77    Temp 98.4 F (36.9 C) (Oral)    Resp 18    Wt 56.1 kg    SpO2 97%    BMI 22.62 kg/m   General: Pt is alert, awake, not in acute distress Cardiovascular: RRR, S1/S2 +, no rubs, no gallops Respiratory: CTA bilaterally, no wheezing, no rhonchi Abdominal: Soft, NT, ND, bowel sounds + Extremities: no edema, no cyanosis  The results of significant diagnostics from this hospitalization (including imaging, microbiology, ancillary and laboratory) are listed below for reference.     Microbiology: Recent Results (from the past 240 hour(s))  Resp Panel by RT-PCR (Flu A&B, Covid) Nasopharyngeal Swab      Status: None   Collection Time: 08/14/21  7:27 PM   Specimen: Nasopharyngeal Swab; Nasopharyngeal(NP) swabs in vial transport medium  Result Value Ref Range Status   SARS Coronavirus 2 by RT PCR NEGATIVE NEGATIVE Final    Comment: (NOTE) SARS-CoV-2 target nucleic acids are NOT DETECTED.  The SARS-CoV-2 RNA is generally detectable in upper respiratory specimens during the acute phase of infection. The lowest concentration of SARS-CoV-2 viral copies this assay can detect is 138 copies/mL. A negative result does not preclude SARS-Cov-2 infection and should not be used as the sole basis for treatment or other patient management decisions. A negative result may occur with  improper specimen collection/handling, submission of specimen other than nasopharyngeal swab, presence of viral mutation(s) within the areas targeted by this assay, and inadequate number of viral copies(<138 copies/mL). A negative result must be combined with clinical  observations, patient history, and epidemiological information. The expected result is Negative.  Fact Sheet for Patients:  EntrepreneurPulse.com.au  Fact Sheet for Healthcare Providers:  IncredibleEmployment.be  This test is no t yet approved or cleared by the [place] FDA and  has been authorized for detection and/or diagnosis of SARS-CoV-2 by FDA under an Emergency Use Authorization (EUA). This EUA will remain  in effect (meaning this test can be used) for the duration of the COVID-19 declaration under Section 564(b)(1) of the Act, 21 U.S.C.section 360bbb-3(b)(1), unless the authorization is terminated  or revoked sooner.       Influenza A by PCR NEGATIVE NEGATIVE Final   Influenza B by PCR NEGATIVE NEGATIVE Final    Comment: (NOTE) The Xpert Xpress SARS-CoV-2/FLU/RSV plus assay is intended as an aid in the diagnosis of influenza from Nasopharyngeal swab specimens and should not be used as a sole basis  for treatment. Nasal washings and aspirates are unacceptable for Xpert Xpress SARS-CoV-2/FLU/RSV testing.  Fact Sheet for Patients: EntrepreneurPulse.com.au  Fact Sheet for Healthcare Providers: IncredibleEmployment.be  This test is not yet approved or cleared by the [place] FDA and has been authorized for detection and/or diagnosis of SARS-CoV-2 by FDA under an Emergency Use Authorization (EUA). This EUA will remain in effect (meaning this test can be used) for the duration of the COVID-19 declaration under Section 564(b)(1) of the Act, 21 U.S.C. section 360bbb-3(b)(1), unless the authorization is terminated or revoked.  Performed at [facility], [place] [address] [place], [place] [address]      Labs: Basic Metabolic Panel: Recent Labs  Lab 08/13/21 2307 08/18/21 0852  NA 137 135  K 3.7 3.6  CL 104 104  CO2 25 23  GLUCOSE 215* 165*  BUN 12 11  CREATININE 0.77 0.91  CALCIUM 9.0 9.7   Liver Function Tests: Recent Labs  Lab 08/13/21 2307 08/18/21 0852  AST 24 20  ALT 14 12  ALKPHOS 87 78  BILITOT 0.5 0.5  PROT 7.0 6.9  ALBUMIN 3.6 3.6   CBC: Recent Labs  Lab 08/13/21 2307 08/18/21 0852  WBC 6.3 5.3  NEUTROABS 2.9  --   HGB 14.3 14.3  HCT 46.4* 45.7  MCV 81.3 80.5  PLT 306 329   CBG: Recent Labs  Lab 08/17/21 1226 08/17/21 1816 08/17/21 2200 08/18/21 0643 08/18/21 1316  GLUCAP 144* 155* 176* 134* 116*   Hgb A1c No results for input(s): HGBA1C in the last 72 hours.  Lipid Profile No results for input(s): CHOL, HDL, LDLCALC, TRIG, CHOLHDL, LDLDIRECT in the last 72 hours.  Thyroid function studies No results for input(s): TSH, T4TOTAL, T3FREE, THYROIDAB in the last 72 hours.  Invalid input(s): FREET3 Urinalysis    Component Value Date/Time   COLORURINE STRAW (A) 02/07/2021 1014   APPEARANCEUR CLEAR 02/07/2021 1014   LABSPEC 1.011 02/07/2021 1014   PHURINE 6.0 02/07/2021 1014    GLUCOSEU 50 (A) 02/07/2021 1014   HGBUR NEGATIVE 02/07/2021 1014   BILIRUBINUR NEGATIVE 02/07/2021 1014   KETONESUR NEGATIVE 02/07/2021 1014   PROTEINUR NEGATIVE 02/07/2021 1014   NITRITE NEGATIVE 02/07/2021 1014   LEUKOCYTESUR TRACE (A) 02/07/2021 1014    FURTHER DISCHARGE INSTRUCTIONS:   Get Medicines reviewed and adjusted: Please take all your medications with you for your next visit with your Primary MD   Laboratory/radiological data: Please request your Primary MD to go over all hospital tests and procedure/radiological results at the follow up, please ask your Primary MD to get all Hospital records  sent to his/her office.   In some cases, they will be blood work, cultures and biopsy results pending at the time of your discharge. Please request that your primary care M.D. goes through all the records of your hospital data and follows up on these results.   Also Note the following: If you experience worsening of your admission symptoms, develop shortness of breath, life threatening emergency, suicidal or homicidal thoughts you must seek medical attention immediately by calling 911 or calling your MD immediately  if symptoms less severe.   You must read complete instructions/literature along with all the possible adverse reactions/side effects for all the Medicines you take and that have been prescribed to you. Take any new Medicines after you have completely understood and accpet all the possible adverse reactions/side effects.    Do not drive when taking Pain medications or sleeping medications (Benzodaizepines)   Do not take more than prescribed Pain, Sleep and Anxiety Medications. It is not advisable to combine anxiety,sleep and pain medications without talking with your primary care practitioner   Special Instructions: If you have smoked or chewed Tobacco  in the last 2 yrs please stop smoking, stop any regular Alcohol  and or any Recreational drug use.   Wear Seat belts while  driving.   Please note: You were cared for by a hospitalist during your hospital stay. Once you are discharged, your primary care physician will handle any further medical issues. Please note that NO REFILLS for any discharge medications will be authorized once you are discharged, as it is imperative that you return to your primary care physician (or establish a relationship with a primary care physician if you do not have one) for your post hospital discharge needs so that they can reassess your need for medications and monitor your lab values.  Time coordinating discharge: 40 minutes  SIGNED:  Marzetta Board, MD, PhD 08/18/2021, 1:39 PM

## 2021-08-17 NOTE — Progress Notes (Signed)
STROKE TEAM PROGRESS NOTE   INTERVAL HISTORY Patient is seen in her room with no family at the bedside.   She is lying comfortably in the bed.  She has no complaints.  Repeat CT of the head shows stable appearance of the punctate hemorrhage no new findings.  Patient is likely going to be discharged to a shelter home.  She is allergic to aspirin and will need to go back on Plavix.  Blood pressure adequately controlled. Vitals:   08/16/21 1922 08/17/21 0017 08/17/21 0333 08/17/21 0826  BP: (!) 153/93 (!) 154/93 (!) 155/92 (!) 147/81  Pulse: 95 95 88 (!) 106  Resp: 18 18 16    Temp: 98.4 F (36.9 C) 98.6 F (37 C) 99 F (37.2 C) 98.3 F (36.8 C)  TempSrc: Oral Oral    SpO2: 98% 94% 99% 96%  Weight:       CBC:  Recent Labs  Lab 08/13/21 2307  WBC 6.3  NEUTROABS 2.9  HGB 14.3  HCT 46.4*  MCV 81.3  PLT 007   Basic Metabolic Panel:  Recent Labs  Lab 08/13/21 2307  NA 137  K 3.7  CL 104  CO2 25  GLUCOSE 215*  BUN 12  CREATININE 0.77  CALCIUM 9.0   Lipid Panel:  Recent Labs  Lab 08/15/21 0843  CHOL 192  TRIG 92  HDL 41  CHOLHDL 4.7  VLDL 18  LDLCALC 133*   HgbA1c:  Recent Labs  Lab 08/15/21 0843  HGBA1C 6.6*   Urine Drug Screen:  Recent Labs  Lab 08/15/21 0715  LABOPIA POSITIVE*  COCAINSCRNUR POSITIVE*  LABBENZ POSITIVE*  AMPHETMU NONE DETECTED  THCU NONE DETECTED  LABBARB NONE DETECTED    Alcohol Level  Recent Labs  Lab 08/13/21 2308  ETH <10    IMAGING past 24 hours CT HEAD WO CONTRAST (5MM)  Result Date: 08/17/2021 CLINICAL DATA:  50 year old female punctate right lentiform lacunar infarct on MRI with severe underlying chronic small vessel disease. Trace hemorrhage left sylvian fissure on CT. EXAM: CT HEAD WITHOUT CONTRAST TECHNIQUE: Contiguous axial images were obtained from the base of the skull through the vertex without intravenous contrast. COMPARISON:  Brain MRI 08/14/2021. CT head 02/06/2021 and 08/13/2021. FINDINGS: Brain: 4-5 mm  hyperdense hemorrhage of the frontal operculum adjacent to the left sylvian fissure (but appears intra-axial series 4, image 36) is more conspicuous since 08/13/2021, but without significant progression. No associated edema or mass effect. Severe chronic small vessel disease with numerous chronic lacunar infarcts in the brainstem and bilateral deep gray matter nuclei. Punctate acute right lentiform involvement occult by CT. No other acute intracranial hemorrhage. No cortically based acute infarct identified. No intracranial mass effect or midline shift. No ventriculomegaly. Vascular: Mild Calcified atherosclerosis at the skull base. No suspicious intracranial vascular hyperdensity. Skull: No acute osseous abnormality identified. Hyperostosis of the calvarium, normal variant. Sinuses/Orbits: Visualized paranasal sinuses and mastoids are stable and well aerated. Other: Visualized orbits and scalp soft tissues are within normal limits. IMPRESSION: 1. Acute microhemorrhage of the left frontal operculum, increased in conspicuity since 08/13/2021 but not significantly progressed, and with no edema or mass effect. 2. Otherwise stable CT brain with very severe small vessel disease. No new intracranial abnormality. Electronically Signed   By: Genevie Ann M.D.   On: 08/17/2021 07:21    PHYSICAL EXAM General: Drowsy, well-developed female in no acute distress.   NEURO:  Mental Status: AA&Ox3 she is slightly uncooperative for exam Speech/Language: speech is without dysarthria or aphasia.  Cranial Nerves:  II: PERRL.  III, IV, VI: EOMI. Eyelids elevate symmetrically.  V: Sensation is intact to light touch and symmetrical to face.  VII: Smile is symmetrical.  VIII: hearing intact to voice. IX, X: Phonation is normal.  XII: tongue is midline without fasciculations. Motor: 5/5 strength to all muscle groups tested.  Sensation- Intact to light touch bilaterally.  Coordination: FTN intact bilaterally,.No drift.   Gait- deferred   ASSESSMENT/PLAN Roberta Calderon is a 50 y.o. female with history of HTN, lacunar strokes and left pontine bleed presenting with lightheadedness, headache, chest pain and right sided weakness. Yesterday, she presented to the ED with headache, lightheadedness, right sided weakness and right sided chest pain. Utox positive for cocaine, benzodiazepines and opiates. MRI brain revealed a small right basal ganglia stroke.  No right sided weakness was appreciated on exam today, and symptoms may have been caused by a left-sided TIA.  Patient has not been taking her home antihypertensives and states that this is because she cannot afford them.  TOC consult placed for assistance with this and for assistance with substance abuse issues.  Stroke:  right lacunar basal ganglia infarct likely secondary to small vessel disease secondary to uncontrolled hypertension and hyperlipidemia.  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 CT head No acute abnormality.  CT angio head pending MRI  44mm acute/early subacute lacunar infarct in right basal ganglia, chronic lacunar infarcts in bilateral basal ganglia, thalami and pons, chronic small vessel ischemic changes Carotid Doppler  pending 2D Echo pending LDL 133 HgbA1c 6.6 VTE prophylaxis - SCDs    Diet   Diet heart healthy/carb modified Room service appropriate? Yes; Fluid consistency: Thin   No antithrombotic prior to admission, now on clopidogrel 75 mg daily. Not on aspirin secondary to allergy. Therapy recommendations: Home health PT  disposition: Home Hypertension Home meds:  Norvasc 10 mg daily, lisinopril 40 mg daily, patient was not taking these at home Stable Permissive hypertension (OK if < 220/120) but gradually normalize in 5-7 days Long-term BP goal normotensive  Hyperlipidemia Home meds:  Atorvastatin 80 mg daily, patient was not taking this at home LDL 133, goal <  70 Continue statin at discharge  Diabetes type II Controlled Home meds:  none HgbA1c 6.6, goal < 7.0 CBGs Recent Labs    08/16/21 2201 08/17/21 0615 08/17/21 1226  GLUCAP 155* 129* 144*    Will advise close follow up with PCP regarding elevated A1C.  Other Stroke Risk Factors Cigarette smokeradvised to stop smoking ETOH use, alcohol level <10, advised to drink no more than 1 drink(s) a day Substance abuse - UDS:  THC NONE DETECTED, Cocaine POSITIVE. Patient advised to stop using due to stroke risk. Hx stroke/   Other Active Problems Nonadherence to home medication regimen Patient states she cannot afford her medications TOC consult placed for assistance with this  Hospital day # 3  Patient presented with mostly chest pain and MRI scan shows a tiny frontal subcortical lacunar infarct as well as multiple remote microhemorrhages and a smaller left frontal new microhemorrhage which is likely clinically asymptomatic.  Recommend strict blood pressure control with systolic goal of less than 160.  Since patient is allergic to aspirin will resume Plavix 75 mg daily as she is at risk for ischemic stroke as well despite her having a small punctate brain hemorrhage which is now several days old and stable.  Patient counseled to be compliant with her medications and aggressive risk factor  modifications and to quit using cocaine.  Long discussion patient and Dr. Cruzita Lederer .  Stroke team will sign off.  Kindly call for questions.  Greater than 50% time during this 25-minute visit was spent in counseling and coordination of care about her lacunar stroke and small microhemorrhage and discussion about stroke prevention substance abuse and answering questions  Antony Contras, MD Medical Director [place] Pager: [phone] 08/17/2021 1:38 PM   To contact Stroke Continuity provider, please refer to [email or web address] After hours, contact General Neurology

## 2021-08-17 NOTE — Plan of Care (Signed)
°  Problem: Coping: Goal: Will verbalize positive feelings about self Outcome: Progressing Goal: Will identify appropriate support needs Outcome: Progressing   Problem: Nutrition: Goal: Risk of aspiration will decrease Outcome: Progressing   Problem: Ischemic Stroke/TIA Tissue Perfusion: Goal: Complications of ischemic stroke/TIA will be minimized Outcome: Progressing   Problem: Coping: Goal: Level of anxiety will decrease Outcome: Progressing

## 2021-08-17 NOTE — TOC Transition Note (Signed)
Transition of Care [facility] - CM/SW Discharge Note   Patient Details  Name: Roberta Calderon MRN: [id number] Date of Birth: Nov 20, 1970  Transition of Care [facility]) CM/SW Contact:  Pollie Friar, RN Phone Number: 08/17/2021, 1:16 PM   Clinical Narrative:    Per patient she is unable to return to her adoptive mother's home at discharge. She requested a shelter list yesterday that CM provided. Patient has not attempted to find space at a shelter. CM called today and pt is to call the [facility] back at 5 pm and see about a space at the shelter. CM will also provide this information to the bedside RN .  Patients discharge medications provided to the bedside RN. Cab voucher also provided to bedside RN in case pt is able to d/c to shelter tonight.  Pt has DME at the bedside.    Final next level of care: Homeless Shelter Barriers to Discharge: No Barriers Identified   Patient Goals and CMS Choice     Choice offered to / list presented to : Patient  Discharge Placement                       Discharge Plan and Services   Discharge Planning Services: CM Consult Post Acute Care Choice: Durable Medical Equipment          DME Arranged: Gilford Rile rolling, 3-N-1 DME Agency: [employer] Date DME Agency Contacted: 08/16/21   Representative spoke with at DME Agency: [place] ([place]) Interventions     Readmission Risk Interventions No flowsheet data found.

## 2021-08-18 LAB — CBC
HCT: 45.7 % (ref 36.0–46.0)
Hemoglobin: 14.3 g/dL (ref 12.0–15.0)
MCH: 25.2 pg — ABNORMAL LOW (ref 26.0–34.0)
MCHC: 31.3 g/dL (ref 30.0–36.0)
MCV: 80.5 fL (ref 80.0–100.0)
Platelets: 329 10*3/uL (ref 150–400)
RBC: 5.68 MIL/uL — ABNORMAL HIGH (ref 3.87–5.11)
RDW: 13.5 % (ref 11.5–15.5)
WBC: 5.3 10*3/uL (ref 4.0–10.5)
nRBC: 0 % (ref 0.0–0.2)

## 2021-08-18 LAB — COMPREHENSIVE METABOLIC PANEL
ALT: 12 U/L (ref 0–44)
AST: 20 U/L (ref 15–41)
Albumin: 3.6 g/dL (ref 3.5–5.0)
Alkaline Phosphatase: 78 U/L (ref 38–126)
Anion gap: 8 (ref 5–15)
BUN: 11 mg/dL (ref 6–20)
CO2: 23 mmol/L (ref 22–32)
Calcium: 9.7 mg/dL (ref 8.9–10.3)
Chloride: 104 mmol/L (ref 98–111)
Creatinine, Ser: 0.91 mg/dL (ref 0.44–1.00)
GFR, Estimated: >60 mL/min (ref >60)
Glucose, Bld: 165 mg/dL — ABNORMAL HIGH (ref 70–99)
Potassium: 3.6 mmol/L (ref 3.5–5.1)
Sodium: 135 mmol/L (ref 135–145)
Total Bilirubin: 0.5 mg/dL (ref 0.3–1.2)
Total Protein: 6.9 g/dL (ref 6.5–8.1)

## 2021-08-18 LAB — GLUCOSE, CAPILLARY
Glucose-Capillary: 134 mg/dL — ABNORMAL HIGH (ref 70–99)
Glucose-Capillary: 116 mg/dL — ABNORMAL HIGH (ref 70–99)

## 2021-08-18 MED ORDER — ONDANSETRON HCL 4 MG/2ML IJ SOLN
4.0000 mg | Freq: Four times a day (QID) | INTRAMUSCULAR | Status: DC | PRN
  Administered 2021-08-18: 10:00:00 4 mg via INTRAVENOUS
  Filled 2021-08-18: qty 2

## 2021-08-18 MED ORDER — ZOLPIDEM TARTRATE 5 MG PO TABS
5.0000 mg | ORAL_TABLET | Freq: Every evening | ORAL | Status: DC | PRN
  Administered 2021-08-18: 01:00:00 5 mg via ORAL
  Filled 2021-08-18: qty 1

## 2021-08-18 NOTE — Plan of Care (Signed)
  Problem: Activity: Goal: Risk for activity intolerance will decrease Outcome: Progressing   Problem: Nutrition: Goal: Adequate nutrition will be maintained Outcome: Progressing   Problem: Coping: Goal: Level of anxiety will decrease Outcome: Progressing   

## 2021-08-18 NOTE — TOC Transition Note (Signed)
Transition of Care [facility]) - CM/SW Discharge Note   Patient Details  Name: Roberta Calderon MRN: [id number] Date of Birth: [birth year]  Transition of Care [facility]) CM/SW Contact:  Pollie Friar, RN Phone Number: 08/18/2021, 2:01 PM   Clinical Narrative:    Pt did not make it to the [place] last night. CM met with her today and she prefers to discharge to the [facility] today. CM has provided the bedside RN with cab voucher and she has the patients medications for discharge.  Bedside RN to call cab when patient is ready.    Final next level of care: Homeless Shelter Barriers to Discharge: Inadequate or no insurance, Barriers Unresolved (comment)   Patient Goals and CMS Choice     Choice offered to / list presented to : Patient  Discharge Placement                       Discharge Plan and Services   Discharge Planning Services: CM Consult Post Acute Care Choice: Durable Medical Equipment          DME Arranged: Gilford Rile rolling, 3-N-1 DME Agency: [employer] Date DME Agency Contacted: 08/16/21   Representative spoke with at DME Agency: [place] ([place]) Interventions     Readmission Risk Interventions No flowsheet data found.

## 2021-08-19 ENCOUNTER — Ambulatory Visit (HOSPITAL_COMMUNITY)
Admission: EM | Admit: 2021-08-19 | Discharge: 2021-08-19 | Disposition: A | Discharge status: Home / Self Care | Payer: No Payment, Other | Attending: Psychiatry | Admitting: Psychiatry

## 2021-08-19 DIAGNOSIS — Z8673 Personal history of transient ischemic attack (TIA), and cerebral infarction without residual deficits: Secondary | ICD-10-CM | POA: Insufficient documentation

## 2021-08-19 DIAGNOSIS — Z9151 Personal history of suicidal behavior: Secondary | ICD-10-CM | POA: Insufficient documentation

## 2021-08-19 DIAGNOSIS — F1911 Other psychoactive substance abuse, in remission: Secondary | ICD-10-CM | POA: Insufficient documentation

## 2021-08-19 DIAGNOSIS — F1190 Opioid use, unspecified, uncomplicated: Secondary | ICD-10-CM | POA: Insufficient documentation

## 2021-08-19 DIAGNOSIS — I10 Essential (primary) hypertension: Secondary | ICD-10-CM | POA: Insufficient documentation

## 2021-08-19 DIAGNOSIS — F1410 Cocaine abuse, uncomplicated: Secondary | ICD-10-CM | POA: Insufficient documentation

## 2021-08-19 DIAGNOSIS — F32A Depression, unspecified: Secondary | ICD-10-CM | POA: Insufficient documentation

## 2021-08-19 DIAGNOSIS — E785 Hyperlipidemia, unspecified: Secondary | ICD-10-CM | POA: Insufficient documentation

## 2021-08-19 DIAGNOSIS — Z5900 Homelessness unspecified: Secondary | ICD-10-CM | POA: Insufficient documentation

## 2021-08-19 DIAGNOSIS — F1910 Other psychoactive substance abuse, uncomplicated: Secondary | ICD-10-CM

## 2021-08-19 DIAGNOSIS — R45851 Suicidal ideations: Secondary | ICD-10-CM | POA: Insufficient documentation

## 2021-08-19 NOTE — BH Assessment (Signed)
Patient is a 50 year old female that presents this date requesting assistance with ongoing SA issues. Patient denies any S/I, H/I or AVH at the time of triage. Patient is unclear of what services she is seeking and when asked patient stated "what do you have?" This writer attempted to explain that there isn't a detox for cocaine since the withdrawal symptoms to not meet medical necessity. Patient is vague in reference to use patterns, amounts used and time frame. Patient reports "using a lot" earlier this date. Patient denies any mental health issues. Patient renders limited history and reports she is currently homeless. This Probation officer discussed with patient OP programs that were available in the area and also long term treatment facilities along with sober living communities. Patient is routine and will be seen by a provider to assist with disposition.

## 2021-08-19 NOTE — Discharge Instructions (Addendum)

## 2021-08-19 NOTE — ED Notes (Signed)
Discharge instructions reviewed with Roberta Calderon and resource guide given and questions entertained . Pt verbalized understanding.
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
## 2021-08-30 ENCOUNTER — Other Ambulatory Visit (HOSPITAL_COMMUNITY): Payer: Self-pay

## 2021-08-30 ENCOUNTER — Telehealth (HOSPITAL_COMMUNITY): Payer: Self-pay

## 2021-08-30 ENCOUNTER — Telehealth (HOSPITAL_COMMUNITY): Payer: Self-pay | Admitting: Emergency Medicine

## 2021-08-30 NOTE — BH Assessment (Signed)
Care Management - [facility]UC Follow Up Discharges  ° °Writer attempted to make contact with patient today and was unsuccessful.  Writer left a HIPPA compliant voice message.  ° °Per chart review, patient was provided with outpatient resources. ° °

## 2021-08-30 NOTE — Telephone Encounter (Signed)
Transitions of Care Pharmacy   Call attempted for a pharmacy transitions of care follow-up. Spoke with daughter and her mother was unavailable. Gave patient's daughter the phone number for [facility] and told her to call us with any questions about her medications. Patient has no refills to transfer out at this time.

## 2021-09-12 ENCOUNTER — Encounter (INDEPENDENT_AMBULATORY_CARE_PROVIDER_SITE_OTHER): Payer: Self-pay | Admitting: Primary Care

## 2021-09-12 ENCOUNTER — Ambulatory Visit (INDEPENDENT_AMBULATORY_CARE_PROVIDER_SITE_OTHER): Payer: Medicaid Other | Admitting: Primary Care

## 2021-09-12 ENCOUNTER — Other Ambulatory Visit: Payer: Self-pay

## 2021-09-12 VITALS — BP 135/94 | HR 86 | Temp 97.3°F | Ht 62.0 in | Wt 129.4 lb

## 2021-09-12 DIAGNOSIS — I10 Essential (primary) hypertension: Secondary | ICD-10-CM

## 2021-09-12 DIAGNOSIS — F1910 Other psychoactive substance abuse, uncomplicated: Secondary | ICD-10-CM

## 2021-09-12 DIAGNOSIS — Z7689 Persons encountering health services in other specified circumstances: Secondary | ICD-10-CM

## 2021-09-12 DIAGNOSIS — F323 Major depressive disorder, single episode, severe with psychotic features: Secondary | ICD-10-CM

## 2021-09-12 DIAGNOSIS — E040 Nontoxic diffuse goiter: Secondary | ICD-10-CM | POA: Diagnosis not present

## 2021-09-12 MED ORDER — LOSARTAN POTASSIUM 50 MG PO TABS: 50.0000 mg | ORAL_TABLET | Freq: Every day | ORAL | 0 refills | Status: DC

## 2021-09-12 MED ORDER — ATORVASTATIN CALCIUM 80 MG PO TABS: 80.0000 mg | ORAL_TABLET | Freq: Every day | ORAL | 1 refills | Status: DC

## 2021-09-12 MED ORDER — AMLODIPINE BESYLATE 10 MG PO TABS: 10.0000 mg | ORAL_TABLET | Freq: Every day | ORAL | 1 refills | Status: DC

## 2021-09-12 NOTE — Patient Instructions (Signed)
Community Resources  Advocacy/Legal Legal Aid [place]:  1-866-219-5262  /  336-272-0148  Family Justice Center:  336-641-7233  [facility] [place] 24-hr Crisis line:  336-273-7273  Women's Resource Center, [place]:  336-275-6090  Court Watch (custody):  336-275-2346  [place] [facility]:   336-279-9299    Baby & Breastfeeding Car Seat Inspection @ Various [place] Fire Depts.- call 336-373-2177  [place] [facility]act[facility]  336-832-6860  [facility] [place] [facility] 336-878-6712  WIC: 336-641-3663 ([place]);  336-641-7571 ([place])  La Leche League:  1-877-452-5321   Childcare [place] [facility]: 336-369-5097 ([place]) / 336-887-8224 ([place])  - Child Care Resources/ Referrals/ Scholarships  - Head Start/ Early Head Start (call or apply online)  [place] DHHS: [place] [facility] :  1-800-859-0829 / 336-274-5437   Employment / Job Search [facility] [place]: 336-275-6090 / [address]  [place] [employer] (JobLink): 336-373-5922 ([place]) / 336-882-4141 ([place])  [facility] [employer] Community Resource/ Career Center: 336-275-9801 / 336-282-7307  [place] [employer] Job & Career Center: 336-373-3764  DHHS Work First: 336-641-3447 ([place]) / 336-641-3447 ([place])  StepUp [facility] [place]:  336-676-5871   Financial Assistance [place] [facility]:  336-553-2657  Salvation Army: 336-235-0368  [employer] Network (furniture):  336-370-4002  [place] Hel[facility] Hands: 336-373-4264  Low Income Energy Assistance  336-641-3000   Food Assistance DHHS- SNAP/ Food Stamps: 336-641-4588  WIC: [place]- 336-641-3663 ;  [place] 336-641-7571  Little Green Book- Free Meals  Little Blue Book- Free Food Pantries  During the summer, text "FOOD" to 877877   General Health / Clinics (Adults) Orange Card (for Adults) through [place] [facility]: (336) 895-4900  [facility]:   336-832-8035  [facility]:   336-832-4444  Health Department:  336-641-3245  [place]  [facility]:  336-415-3877 / 336-641-2100  Planned Parenthood of [place]:   336-373-0678  [facility]:   336-334-4822 x 50251   Housing [place] Housing Coalition:   336-691-9521  [place] Housing Authority:  336-275-8501  Affordable Housing Managemnt:  336-273-0568   Immigrant/ Refugee Center for [place]ians (UNCG):  336-256-1065  Faith Action [facility]:  336-379-0037  [employer]rivals Institute:  336-937-4701  Church World Services:  336-617-0381  African Services Coalition:  336-574-2677   LGBTQ YouthSAFE  www.youthsafe[place].org  PFLAG  336-541-6754 / info@[place].org  The Trevor Project:  1-866-488-7386   Mental Health/ Substance Use [facility] [place]  336-387-6161  [facility]:  336-832-9700 or 1-800-711-2635  [place]'s Circle of Care:  336-271-5888  [facility]:  336-294-1349  [place] Care Services:  336-542-2884  [facility] (walk-ins)  336-676-6840 / [address]  [place]:  800-449-1287  Alcoholics Anonymous:  336-854-4278  Narcotics Anonymous:  800-365-1036  Quit Smoking Hotline:  800-QUIT-NOW (800-784-8669)   Parenting Children's Home Society:  800-632-1400  [place]: Education Center & Support Groups:  336-832-6682  YWCA: 336-273-3461  UNCG: Bringing Out the Best:  336-334-3120               Thriving at Three (Hispanic families): 336-256-1066  Healthy Start ([facility] [place]):  336-387-6161 x2288  Parents as Teachers:  336-691-0024  [place] [facility]- Learning Together (Immigrants): 336-369-5001   Poison Control 800-222-1222  Sports & Recreation [employer] Open Doors Application: ymcanwnc.org/join/open-doors-financial-assistance/  City of [place] Recreation Centers: http://www.[place].gov/index.aspx?page=3615   Special Needs Family Support Network:  336-832-6507  Autism Society of [place]:   336-333-0197 x1402 or x1412 /  800-785-1035  TEACCH [facility] [place]:  336-334-5773     ARC of [place]:  336-373-1076  Children's Developmental Service Agency (CDSA):  336-334-5601  CC4C (Care Coordination for Children):  336-641-7641   Transportation Medicaid Transportation: 336-641-4848 to apply  [place] Transit Authority: 336-335-6499 (reduced-fare bus ID to Medicaid/ Medicare/ Orange Card)  SCAT Paratransit services: Eligible riders only, call 336-333-6589 for application   Tutoring/Mentoring Black Child Development Institute: 336-230-2138  Big Brothers/ Big Sisters: 336-378-9100 ([place])  336-882-4167 ([place])  ACES through child's school: 336-370-2321  [employer] Achievers: contact your local Y  SHIELD Mentor Program: 336-337-2771   

## 2021-09-12 NOTE — Progress Notes (Signed)
[facility]   Subjective:   Roberta Calderon is a 51 y.o. female presents for hospital follow up and establish care. Admit date to the hospital was 08/19/21, patient was discharged from the hospital on 08/19/21, patient was admitted for: polysubstance abuse and trans for tx. Today she denies shortness of breath, headaches, chest pain or lower extremity edema . Any drug or alcohol use since d/c Past Medical History:  Diagnosis Date   Hypertension      Allergies  Allergen Reactions   Nsaids Nausea And Vomiting and Rash   Aspirin Hives      Current Outpatient Medications on File Prior to Visit  Medication Sig Dispense Refill   amLODipine (NORVASC) 10 MG tablet Take 1 tablet (10 mg total) by mouth daily. 30 tablet 0   atorvastatin (LIPITOR) 80 MG tablet Take 1 tablet (80 mg total) by mouth daily. 30 tablet 0   clopidogrel (PLAVIX) 75 MG tablet Take 1 tablet (75 mg total) by mouth daily. 30 tablet 0   losartan (COZAAR) 50 MG tablet Take 1 tablet (50 mg total) by mouth daily. 30 tablet 0   Multiple Vitamins-Minerals (CERTAVITE/ANTIOXIDANTS) TABS Take 1 tablet by mouth daily. 30 tablet 0   No current facility-administered medications on file prior to visit.     Review of System: Comprehensive ROS Pertinent positive and negative noted in HPI    Objective:  BP (!) 146/91 (BP Location: Right Arm, Patient Position: Sitting, Cuff Size: Normal)    Pulse 91    Temp (!) 97.3 F (36.3 C) (Temporal)    Ht 5\' 2"  (1.575 m)    Wt 129 lb 6.4 oz (58.7 kg)    SpO2 98%    BMI 23.67 kg/m   Filed Weights   09/12/21 0927  Weight: 129 lb 6.4 oz (58.7 kg)    Physical Exam: General Appearance: Well nourished, in no apparent distress. Eyes: PERRLA, EOMs, conjunctiva no swelling or erythema Sinuses: No Frontal/maxillary tenderness ENT/Mouth: Ext aud canals clear, TMs without erythema, bulging. Hearing normal.  Neck: Supple, thyroid normal.  Respiratory: Respiratory effort normal,  BS equal bilaterally without rales, rhonchi, wheezing or stridor.  Cardio: RRR with no MRGs. Brisk peripheral pulses without edema.  Abdomen: Soft, + BS.  Non tender, no guarding, rebound, hernias, masses. Lymphatics: Non tender without lymphadenopathy.  Musculoskeletal: Full ROM, 5/5 strength, normal gait.  Skin: Warm, dry without rashes, lesions, ecchymosis.  Neuro: Cranial nerves intact. Normal muscle tone, no cerebellar symptoms. Sensation intact.  Psych: Awake and oriented X 3, normal affect, Insight and Judgment appropriate.    Assessment:  Roberta Calderon was seen today for hospitalization follow-up.  Diagnoses and all orders for this visit:  Encounter to establish care Establish care with PCP  Goiter diffuse -     TSH + free T4; Future  Polysubstance abuse ([place]) Currently stopped per patient  Current severe episode of major depressive disorder with psychotic features, unspecified whether recurrent ([place]) -     Ambulatory referral to Psychiatry  Hypertension, unspecified type Counseled on blood pressure goal of less than 130/80, low-sodium, DASH diet, medication compliance, 150 minutes of moderate intensity exercise per week. Discussed medication compliance, adverse effects.   Other orders -     losartan (COZAAR) 50 MG tablet; Take 1 tablet (50 mg total) by mouth daily. -     atorvastatin (LIPITOR) 80 MG tablet; Take 1 tablet (80 mg total) by mouth daily. -     amLODipine (NORVASC) 10 MG tablet; Take 1  tablet (10 mg total) by mouth daily.  This note has been created with Surveyor, quantity. Any transcriptional errors are unintentional.   Kerin Perna, NP 09/12/2021, 9:34 AM

## 2021-12-11 ENCOUNTER — Encounter (INDEPENDENT_AMBULATORY_CARE_PROVIDER_SITE_OTHER): Payer: Self-pay | Admitting: Primary Care

## 2021-12-11 ENCOUNTER — Ambulatory Visit (INDEPENDENT_AMBULATORY_CARE_PROVIDER_SITE_OTHER): Payer: Medicaid Other | Admitting: Primary Care

## 2021-12-11 VITALS — BP 162/91 | HR 77 | Temp 97.8°F | Ht 63.0 in | Wt 127.0 lb

## 2021-12-11 DIAGNOSIS — I10 Essential (primary) hypertension: Secondary | ICD-10-CM

## 2021-12-11 DIAGNOSIS — E040 Nontoxic diffuse goiter: Secondary | ICD-10-CM

## 2021-12-11 DIAGNOSIS — R197 Diarrhea, unspecified: Secondary | ICD-10-CM | POA: Diagnosis not present

## 2021-12-11 DIAGNOSIS — I639 Cerebral infarction, unspecified: Secondary | ICD-10-CM | POA: Diagnosis not present

## 2021-12-11 MED ORDER — LOSARTAN POTASSIUM 50 MG PO TABS: 50.0000 mg | ORAL_TABLET | Freq: Every day | ORAL | 1 refills | Status: DC

## 2021-12-11 MED ORDER — AMLODIPINE BESYLATE 10 MG PO TABS: 10.0000 mg | ORAL_TABLET | Freq: Every day | ORAL | 1 refills | Status: DC

## 2021-12-11 NOTE — Progress Notes (Signed)
?[place] ? ? ?Roberta Calderon is a 51 y.o. female presents for hypertension evaluation, Denies shortness of breath, headaches, chest pain or lower extremity edema, sudden onset, vision changes, unilateral weakness, dizziness, paresthesias. She states has had diarrhea for a week. She is not having any problems eating or keeping food down Patient denies adherence with medications. Bp medication out. Hx of CVA - Asked if continuing to use cocaine or drink stated no. ? ?Dietary habits include: eating potato chips non compliant with DASH diet ?Exercise habits include:walks  ?Family / Social history: none  ? ? ?Past Medical History:  ?Diagnosis Date  ? Hypertension   ? ?No past surgical history on file. ?Allergies  ?Allergen Reactions  ? Nsaids Nausea And Vomiting and Rash  ? Aspirin Hives  ? ?Current Outpatient Medications on File Prior to Visit  ?Medication Sig Dispense Refill  ? amLODipine (NORVASC) 10 MG tablet Take 1 tablet (10 mg total) by mouth daily. 90 tablet 1  ? atorvastatin (LIPITOR) 80 MG tablet Take 1 tablet (80 mg total) by mouth daily. 90 tablet 1  ? clopidogrel (PLAVIX) 75 MG tablet Take 1 tablet (75 mg total) by mouth daily. 30 tablet 0  ? losartan (COZAAR) 50 MG tablet Take 1 tablet (50 mg total) by mouth daily. 30 tablet 0  ? Multiple Vitamins-Minerals (CERTAVITE/ANTIOXIDANTS) TABS Take 1 tablet by mouth daily. 30 tablet 0  ? ?No current facility-administered medications on file prior to visit.  ? ?Social History  ? ?Socioeconomic History  ? Marital status: Single  ?  Spouse name: Not on file  ? Number of children: Not on file  ? Years of education: Not on file  ? Highest education level: Not on file  ?Occupational History  ? Not on file  ?Tobacco Use  ? Smoking status: Every Day  ?  Packs/day: 0.25  ?  Types: Cigarettes  ? Smokeless tobacco: Never  ?Substance and Sexual Activity  ? Alcohol use: Yes  ? Drug use: No  ? Sexual activity: Not on file  ?Other Topics Concern  ? Not on  file  ?Social History Narrative  ? Not on file  ? ?Social Determinants of Health  ? ?Financial Resource Strain: Not on file  ?Food Insecurity: Not on file  ?Transportation Needs: Not on file  ?Physical Activity: Not on file  ?Stress: Not on file  ?Social Connections: Not on file  ?Intimate Partner Violence: Not on file  ? ?No family history on file. ? ? ?OBJECTIVE: ? ?Vitals:  ? 12/11/21 1004 12/11/21 1020  ?BP: (!) 176/92 (!) 162/91  ?Pulse: 77 77  ?Temp: 97.8 ?F (36.6 ?C)   ?TempSrc: Oral   ?SpO2: 95%   ?Weight: 127 lb (57.6 kg)   ?Height: '5\' 3"'$  (1.6 m)   ? ? ?Physical Exam ?Vitals reviewed.  ?Constitutional:   ?   Appearance: Normal appearance.  ?HENT:  ?   Head: Normocephalic.  ?   Right Ear: Tympanic membrane normal.  ?   Left Ear: Tympanic membrane and external ear normal.  ?   Nose: Nose normal.  ?Eyes:  ?   Extraocular Movements: Extraocular movements intact.  ?   Pupils: Pupils are equal, round, and reactive to light.  ?Cardiovascular:  ?   Rate and Rhythm: Normal rate and regular rhythm.  ?Pulmonary:  ?   Effort: Pulmonary effort is normal.  ?   Breath sounds: Normal breath sounds.  ?Abdominal:  ?   General: Abdomen is flat. Bowel sounds  are normal.  ?   Palpations: Abdomen is soft.  ?Musculoskeletal:     ?   General: Normal range of motion.  ?   Cervical back: Normal range of motion.  ?Skin: ?   General: Skin is warm and dry.  ?Neurological:  ?   Mental Status: She is alert and oriented to person, place, and time.  ?Psychiatric:     ?   Mood and Affect: Mood normal.     ?   Behavior: Behavior normal.     ?   Thought Content: Thought content normal.     ?   Judgment: Judgment normal.  ? ? ?ROS ?Comprehensive ROS Pertinent positive and negative noted in HPI  ? ?Last 3 Office BP readings: ?BP Readings from Last 3 Encounters:  ?12/11/21 (!) 162/91  ?09/12/21 (!) 135/94  ?08/18/21 (!) 124/104  ? ? ?BMET ?   ?Component Value Date/Time  ? NA 135 08/18/2021 0852  ? K 3.6 08/18/2021 0852  ? CL 104 08/18/2021 0852   ? CO2 23 08/18/2021 0852  ? GLUCOSE 165 (H) 08/18/2021 6222  ? BUN 11 08/18/2021 0852  ? CREATININE 0.91 08/18/2021 0852  ? CALCIUM 9.7 08/18/2021 0852  ? GFRNONAA >60 08/18/2021 0852  ? GFRAA >60 04/11/2020 0810  ? ? ?Renal function: ?CrCl cannot be calculated (Patient's most recent lab result is older than the maximum 21 days allowed.). ? ?Clinical ASCVD: No  ?The ASCVD Risk score (Arnett DK, et al., 2019) failed to calculate for the following reasons: ?  The patient has a prior MI or stroke diagnosis ? ?ASCVD risk factors include- [place] ? ? ?ASSESSMENT & PLAN: ?Roberta Calderon was seen today for hypertension and thyroid problem. ? ?Diagnoses and all orders for this visit: ? ?Goiter diffuse ?THS/T4  ? ?Ischemic stroke ([place]) ?She was suppose to schedule appt with [facility] (Neurology) for  stroke clinic no notes indicating appt ? ?Diarrhea, unspecified type ?Unknown etiology 3-4 loose stool daily - recommended BRAT diet and hydration water and pedilyte  ? ?Other orders ?-     losartan (COZAAR) 50 MG tablet; Take 1 tablet (50 mg total) by mouth daily. ? ?  ?Hypertension, unspecified type ?-Counseled on lifestyle modifications for blood pressure control including reduced dietary sodium, increased exercise, weight reduction and adequate sleep. Also, educated patient about the risk for cardiovascular events, stroke and heart attack. Also counseled patient about the importance of medication adherence. If you participate in smoking, it is important to stop using tobacco as this will increase the risks associated with uncontrolled blood pressure.  ? ?Goal BP:  ?For patients younger than 60: Goal BP < 130/80. ?For patients 60 and older: Goal BP < 140/90. ?For patients with diabetes: Goal BP < 130/80. ?Your most recent BP: 162/91 ? ?Minimize salt intake. ?Minimize alcohol intake ? ? ?This note has been created with Surveyor, quantity. Any transcriptional errors are  unintentional.  ? ?Kerin Perna, NP ?12/11/2021, 10:21 AM ?  ?

## 2021-12-11 NOTE — Patient Instructions (Signed)
Diarrhea, Adult Diarrhea is frequent loose and watery bowel movements. Diarrhea can make you feel weak and cause you to become dehydrated. Dehydration can make you tired and thirsty, cause you to have a dry mouth, and decrease how often you urinate. Diarrhea typically lasts 2-3 days. However, it can last longer if it is a sign of something more serious. It is important to treat your diarrhea as told by your health care provider. Follow these instructions at home: Eating and drinking     Follow these recommendations as told by your health care provider: Take an oral rehydration solution (ORS). This is an over-the-counter medicine that helps return your body to its normal balance of nutrients and water. It is found at pharmacies and retail stores. Drink plenty of fluids, such as water, ice chips, diluted fruit juice, and low-calorie sports drinks. You can drink milk also, if desired. Avoid drinking fluids that contain a lot of sugar or caffeine, such as energy drinks, sports drinks, and soda. Eat bland, easy-to-digest foods in small amounts as you are able. These foods include bananas, applesauce, rice, lean meats, toast, and crackers. Avoid alcohol. Avoid spicy or fatty foods.  Medicines Take over-the-counter and prescription medicines only as told by your health care provider. If you were prescribed an antibiotic medicine, take it as told by your health care provider. Do not stop using the antibiotic even if you start to feel better. General instructions  Wash your hands often using soap and water. If soap and water are not available, use a hand sanitizer. Others in the household should wash their hands as well. Hands should be washed: After using the toilet or changing a diaper. Before preparing, cooking, or serving food. While caring for a sick person or while visiting someone in a hospital. Drink enough fluid to keep your urine pale yellow. Rest at home while you recover. Watch your  condition for any changes. Take a warm bath to relieve any burning or pain from frequent diarrhea episodes. Keep all follow-up visits as told by your health care provider. This is important. Contact a health care provider if: You have a fever. Your diarrhea gets worse. You have new symptoms. You cannot keep fluids down. You feel light-headed or dizzy. You have a headache. You have muscle cramps. Get help right away if: You have chest pain. You feel extremely weak or you faint. You have bloody or black stools or stools that look like tar. You have severe pain, cramping, or bloating in your abdomen. You have trouble breathing or you are breathing very quickly. Your heart is beating very quickly. Your skin feels cold and clammy. You feel confused. You have signs of dehydration, such as: Dark urine, very little urine, or no urine. Cracked lips. Dry mouth. Sunken eyes. Sleepiness. Weakness. Summary Diarrhea is frequent loose and sometimes watery bowel movements. Diarrhea can make you feel weak and cause you to become dehydrated. Drink enough fluids to keep your urine pale yellow. Make sure that you wash your hands after using the toilet. If soap and water are not available, use hand sanitizer. Contact a health care provider if your diarrhea gets worse or you have new symptoms. Get help right away if you have signs of dehydration. This information is not intended to replace advice given to you by your health care provider. Make sure you discuss any questions you have with your health care provider. Document Revised: 02/22/2021 Document Reviewed: 02/22/2021 Elsevier Patient Education  2023 Elsevier Inc.  

## 2021-12-12 LAB — TSH+FREE T4
Free T4: 1.12 ng/dL (ref 0.82–1.77)
TSH: 0.672 u[IU]/mL (ref 0.450–4.500)

## 2021-12-15 ENCOUNTER — Telehealth (INDEPENDENT_AMBULATORY_CARE_PROVIDER_SITE_OTHER): Payer: Self-pay

## 2021-12-15 NOTE — Telephone Encounter (Signed)
-----   Message from Kerin Perna, NP sent at 12/12/2021  9:23 AM EDT ----- ?Thyroid  levels are normal no treatment needed ?

## 2021-12-15 NOTE — Telephone Encounter (Signed)
Patient was unavailable. Spoke with her Mother per DPR. Mom is aware that patient thyroid levels are normal. No treatment needed. She verbalized understanding and will inform patient of results. Nat Christen, CMA  ?

## 2022-01-09 ENCOUNTER — Emergency Department (HOSPITAL_COMMUNITY)
Admission: EM | Admit: 2022-01-09 | Discharge: 2022-01-09 | Disposition: A | Discharge status: Home / Self Care | Payer: Medicaid Other | Attending: Emergency Medicine | Admitting: Emergency Medicine

## 2022-01-09 DIAGNOSIS — Z5321 Procedure and treatment not carried out due to patient leaving prior to being seen by health care provider: Secondary | ICD-10-CM | POA: Insufficient documentation

## 2022-01-09 DIAGNOSIS — R202 Paresthesia of skin: Secondary | ICD-10-CM | POA: Insufficient documentation

## 2022-01-09 NOTE — ED Notes (Signed)
Pt called for VS, no response.  °

## 2022-01-09 NOTE — ED Triage Notes (Addendum)
Pt. Stated, My left hand fingers are locking up and I need a splint. This has been going on for a month ?

## 2022-01-10 ENCOUNTER — Ambulatory Visit (INDEPENDENT_AMBULATORY_CARE_PROVIDER_SITE_OTHER): Payer: Medicaid Other | Admitting: Primary Care

## 2022-01-10 ENCOUNTER — Encounter (INDEPENDENT_AMBULATORY_CARE_PROVIDER_SITE_OTHER): Payer: Self-pay | Admitting: Primary Care

## 2022-01-10 VITALS — BP 163/90 | HR 85 | Temp 98.5°F | Ht 63.0 in | Wt 131.2 lb

## 2022-01-10 DIAGNOSIS — I6381 Other cerebral infarction due to occlusion or stenosis of small artery: Secondary | ICD-10-CM

## 2022-01-10 DIAGNOSIS — M25642 Stiffness of left hand, not elsewhere classified: Secondary | ICD-10-CM | POA: Diagnosis not present

## 2022-01-10 DIAGNOSIS — I10 Essential (primary) hypertension: Secondary | ICD-10-CM

## 2022-01-10 DIAGNOSIS — Z760 Encounter for issue of repeat prescription: Secondary | ICD-10-CM | POA: Diagnosis not present

## 2022-01-10 MED ORDER — AMLODIPINE BESYLATE 10 MG PO TABS: 10.0000 mg | ORAL_TABLET | Freq: Every day | ORAL | 1 refills | Status: DC

## 2022-01-10 NOTE — Progress Notes (Signed)
Patient states she having a hard time walking would like Rx for a cane  She complains that her fingers are locking up she would like Rx for hand splint

## 2022-01-10 NOTE — Patient Instructions (Signed)
Hypertension, Adult High blood pressure (hypertension) is when the force of blood pumping through the arteries is too strong. The arteries are the blood vessels that carry blood from the heart throughout the body. Hypertension forces the heart to work harder to pump blood and may cause arteries to become narrow or stiff. Untreated or uncontrolled hypertension can lead to a heart attack, heart failure, a stroke, kidney disease, and other problems. A blood pressure reading consists of a higher number over a lower number. Ideally, your blood pressure should be below 120/80. The first ("top") number is called the systolic pressure. It is a measure of the pressure in your arteries as your heart beats. The second ("bottom") number is called the diastolic pressure. It is a measure of the pressure in your arteries as the heart relaxes. What are the causes? The exact cause of this condition is not known. There are some conditions that result in high blood pressure. What increases the risk? Certain factors may make you more likely to develop high blood pressure. Some of these risk factors are under your control, including: Smoking. Not getting enough exercise or physical activity. Being overweight. Having too much fat, sugar, calories, or salt (sodium) in your diet. Drinking too much alcohol. Other risk factors include: Having a personal history of heart disease, diabetes, high cholesterol, or kidney disease. Stress. Having a family history of high blood pressure and high cholesterol. Having obstructive sleep apnea. Age. The risk increases with age. What are the signs or symptoms? High blood pressure may not cause symptoms. Very high blood pressure (hypertensive crisis) may cause: Headache. Fast or irregular heartbeats (palpitations). Shortness of breath. Nosebleed. Nausea and vomiting. Vision changes. Severe chest pain, dizziness, and seizures. How is this diagnosed? This condition is diagnosed by  measuring your blood pressure while you are seated, with your arm resting on a flat surface, your legs uncrossed, and your feet flat on the floor. The cuff of the blood pressure monitor will be placed directly against the skin of your upper arm at the level of your heart. Blood pressure should be measured at least twice using the same arm. Certain conditions can cause a difference in blood pressure between your right and left arms. If you have a high blood pressure reading during one visit or you have normal blood pressure with other risk factors, you may be asked to: Return on a different day to have your blood pressure checked again. Monitor your blood pressure at home for 1 week or longer. If you are diagnosed with hypertension, you may have other blood or imaging tests to help your health care provider understand your overall risk for other conditions. How is this treated? This condition is treated by making healthy lifestyle changes, such as eating healthy foods, exercising more, and reducing your alcohol intake. You may be referred for counseling on a healthy diet and physical activity. Your health care provider may prescribe medicine if lifestyle changes are not enough to get your blood pressure under control and if: Your systolic blood pressure is above 130. Your diastolic blood pressure is above 80. Your personal target blood pressure may vary depending on your medical conditions, your age, and other factors. Follow these instructions at home: Eating and drinking  Eat a diet that is high in fiber and potassium, and low in sodium, added sugar, and fat. An example of this eating plan is called the DASH diet. DASH stands for Dietary Approaches to Stop Hypertension. To eat this way: Eat   plenty of fresh fruits and vegetables. Try to fill one half of your plate at each meal with fruits and vegetables. Eat whole grains, such as whole-wheat pasta, brown rice, or whole-grain bread. Fill about one  fourth of your plate with whole grains. Eat or drink low-fat dairy products, such as skim milk or low-fat yogurt. Avoid fatty cuts of meat, processed or cured meats, and poultry with skin. Fill about one fourth of your plate with lean proteins, such as fish, chicken without skin, beans, eggs, or tofu. Avoid pre-made and processed foods. These tend to be higher in sodium, added sugar, and fat. Reduce your daily sodium intake. Many people with hypertension should eat less than 1,500 mg of sodium a day. Do not drink alcohol if: Your health care provider tells you not to drink. You are pregnant, may be pregnant, or are planning to become pregnant. If you drink alcohol: Limit how much you have to: 0-1 drink a day for women. 0-2 drinks a day for men. Know how much alcohol is in your drink. In the [place] one drink equals one 12 oz bottle of beer (355 mL), one 5 oz glass of wine (148 mL), or one 1 oz glass of hard liquor (44 mL). Lifestyle  Work with your health care provider to maintain a healthy body weight or to lose weight. Ask what an ideal weight is for you. Get at least 30 minutes of exercise that causes your heart to beat faster (aerobic exercise) most days of the week. Activities may include walking, swimming, or biking. Include exercise to strengthen your muscles (resistance exercise), such as Pilates or lifting weights, as part of your weekly exercise routine. Try to do these types of exercises for 30 minutes at least 3 days a week. Do not use any products that contain nicotine or tobacco. These products include cigarettes, chewing tobacco, and vaping devices, such as e-cigarettes. If you need help quitting, ask your health care provider. Monitor your blood pressure at home as told by your health care provider. Keep all follow-up visits. This is important. Medicines Take over-the-counter and prescription medicines only as told by your health care provider. Follow directions carefully. Blood  pressure medicines must be taken as prescribed. Do not skip doses of blood pressure medicine. Doing this puts you at risk for problems and can make the medicine less effective. Ask your health care provider about side effects or reactions to medicines that you should watch for. Contact a health care provider if you: Think you are having a reaction to a medicine you are taking. Have headaches that keep coming back (recurring). Feel dizzy. Have swelling in your ankles. Have trouble with your vision. Get help right away if you: Develop a severe headache or confusion. Have unusual weakness or numbness. Feel faint. Have severe pain in your chest or abdomen. Vomit repeatedly. Have trouble breathing. These symptoms may be an emergency. Get help right away. Call 911. Do not wait to see if the symptoms will go away. Do not drive yourself to the hospital. Summary Hypertension is when the force of blood pumping through your arteries is too strong. If this condition is not controlled, it may put you at risk for serious complications. Your personal target blood pressure may vary depending on your medical conditions, your age, and other factors. For most people, a normal blood pressure is less than 120/80. Hypertension is treated with lifestyle changes, medicines, or a combination of both. Lifestyle changes include losing weight, eating a healthy,   low-sodium diet, exercising more, and limiting alcohol. This information is not intended to replace advice given to you by your health care provider. Make sure you discuss any questions you have with your health care provider. Document Revised: 06/20/2021 Document Reviewed: 06/20/2021 Elsevier Patient Education  2023 Elsevier Inc.  

## 2022-01-12 ENCOUNTER — Telehealth (INDEPENDENT_AMBULATORY_CARE_PROVIDER_SITE_OTHER): Payer: Self-pay | Admitting: Primary Care

## 2022-01-12 NOTE — Telephone Encounter (Signed)
Copied from [place] [id number]. Topic: General - Other >> Jan 11, 2022  4:37 PM Yvette Rack wrote: Reason for CRM: Pt stated she called [phone] to schedule an appt for physical therapy but she was told that the referral request had to be faxed to them. Pt stated she does not have the fax#.

## 2022-01-12 NOTE — Telephone Encounter (Signed)
Pt is calling back stating that [facility] needs refferal faxed to them. Please advise  [phone]

## 2022-01-12 NOTE — Telephone Encounter (Signed)
Spoke with patient and tried to determine which location is stating they need a referral faxed. After going back and forth and not getting any clarity patient hung up. Both referrals have sent.

## 2022-01-12 NOTE — Telephone Encounter (Signed)
Attempted to reach patient, voicemail full. She is already scheduled for an appt with PT on 5/24.

## 2022-01-15 ENCOUNTER — Telehealth (INDEPENDENT_AMBULATORY_CARE_PROVIDER_SITE_OTHER): Payer: Self-pay | Admitting: Primary Care

## 2022-01-15 NOTE — Telephone Encounter (Signed)
Copied from [place] [phone]. Topic: General - Inquiry >> Jan 15, 2022  1:57 PM Oneta Rack wrote: Requesting a new referral for  OT for stiffness of finger of left hand is the dx. OT referral and not PT referral.

## 2022-01-15 NOTE — Telephone Encounter (Signed)
Routed to PCP to contact.

## 2022-01-17 ENCOUNTER — Ambulatory Visit: Payer: Medicaid Other | Admitting: Orthopaedic Surgery

## 2022-01-17 ENCOUNTER — Other Ambulatory Visit (INDEPENDENT_AMBULATORY_CARE_PROVIDER_SITE_OTHER): Payer: Self-pay | Admitting: Primary Care

## 2022-01-17 DIAGNOSIS — M79645 Pain in left finger(s): Secondary | ICD-10-CM

## 2022-01-21 NOTE — Progress Notes (Signed)
[place]  Lacreshia Bondarenko, is a 51 y.o. female  SAY:[id number]  ATF:[id number]  DOB - 07/23/1971  Chief Complaint  Patient presents with   Blood Pressure Check    Per patient she is taking medications       Subjective:  Ms. Zikeria Keough is a 51 y.o. female here today for a follow up visit for hypertension, medication refill and stiffness locking up finger on left hand and requesting a  Rx for hand splint.hand Patient a RX for a cane having increase abnormal gait and difficulty  walking . Patient has No headache, No chest pain, No abdominal pain - No Nausea, No new weakness tingling or numbness, No Cough - shortness of breath  No problems updated. Allergies  Allergen Reactions   Nsaids Nausea And Vomiting and Rash   Aspirin Hives    Past Medical History:  Diagnosis Date   Hypertension     Current Outpatient Medications on File Prior to Visit  Medication Sig Dispense Refill   atorvastatin (LIPITOR) 80 MG tablet Take 1 tablet (80 mg total) by mouth daily. 90 tablet 1   clopidogrel (PLAVIX) 75 MG tablet Take 1 tablet (75 mg total) by mouth daily. 30 tablet 0   losartan (COZAAR) 50 MG tablet Take 1 tablet (50 mg total) by mouth daily. 90 tablet 1   Multiple Vitamins-Minerals (CERTAVITE/ANTIOXIDANTS) TABS Take 1 tablet by mouth daily. 30 tablet 0   No current facility-administered medications on file prior to visit.  Comprehensive ROS Pertinent positive and negative noted in HPI   Objective:   Vitals:   01/10/22 1108 01/10/22 1121  BP: (!) 157/94 (!) 163/90  Pulse: 88 85  Temp: 98.5 F (36.9 C)   TempSrc: Oral   SpO2: 96%   Weight: 131 lb 3.2 oz (59.5 kg)   Height: '5\' 3"'$  (1.6 m)     Exam General appearance : Awake, alert, not in any distress. Speech Clear. Not toxic looking HEENT: Atraumatic and Normocephalic, pupils equally reactive to light and accomodation Neck: Supple, no JVD. No cervical lymphadenopathy.  Chest: Good air entry  bilaterally, no added sounds  CVS: S1 S2 regular, no murmurs.  Abdomen: Bowel sounds present, Non tender and not distended with no gaurding, rigidity or rebound. Extremities: B/L Lower Ext shows no edema, both legs are warm to touch Neurology: Awake alert, and oriented X 3,Non focal Skin: No Rash  Data Review Lab Results  Component Value Date   HGBA1C 6.6 (H) 08/15/2021   HGBA1C 6.3 (H) 02/06/2021   HGBA1C 6.3 (H) 04/05/2020    Assessment & Plan   1. Hypertension, unspecified type Counseled on blood pressure goal of less than 130/80, hx of CVA increasing risk for another one, with drug abuse (drug of choice cocaine /alcohol) low-sodium, DASH diet, medication compliance, 150 minutes of moderate intensity exercise per week. Discussed medication compliance, adverse effects.  - amLODipine (NORVASC) 10 MG tablet; Take 1 tablet (10 mg total) by mouth daily.  Dispense: 90 tablet; Refill: 1  2. Stiffness of finger joint of left hand - Ambulatory referral to Physical Therapy  3. Cerebrovascular accident (CVA) of right basal ganglia (HCC) - AMB referral to orthopedics - Ambulatory referral to Physical Therapy  4. Medication refill - amLODipine (NORVASC) 10 MG tablet; Take 1 tablet (10 mg total) by mouth daily.  Dispense: 90 tablet; Refill: 1  Patient have been counseled extensively about nutrition and exercise. Other issues discussed during this visit include: low cholesterol diet, weight control and daily  exercise, foot care, annual eye examinations at Ophthalmology, importance of adherence with medications and regular follow-up. We also discussed long term complications of uncontrolled diabetes and hypertension.   Return in about 2 weeks (around 01/24/2022) for Bp.  The patient was given clear instructions to go to ER or return to medical center if symptoms don't improve, worsen or new problems develop. The patient verbalized understanding. The patient was told to call to get lab results  if they haven't heard anything in the next week.   This note has been created with Surveyor, quantity. Any transcriptional errors are unintentional.   Kerin Perna, NP 01/21/2022, 8:59 PM

## 2022-01-26 ENCOUNTER — Ambulatory Visit: Payer: Medicaid Other | Attending: Primary Care

## 2022-01-26 DIAGNOSIS — R2689 Other abnormalities of gait and mobility: Secondary | ICD-10-CM | POA: Insufficient documentation

## 2022-01-26 DIAGNOSIS — M25642 Stiffness of left hand, not elsewhere classified: Secondary | ICD-10-CM | POA: Diagnosis not present

## 2022-01-26 DIAGNOSIS — M6281 Muscle weakness (generalized): Secondary | ICD-10-CM | POA: Insufficient documentation

## 2022-01-26 DIAGNOSIS — I6381 Other cerebral infarction due to occlusion or stenosis of small artery: Secondary | ICD-10-CM | POA: Diagnosis present

## 2022-01-26 DIAGNOSIS — R278 Other lack of coordination: Secondary | ICD-10-CM | POA: Insufficient documentation

## 2022-01-26 DIAGNOSIS — R41842 Visuospatial deficit: Secondary | ICD-10-CM | POA: Insufficient documentation

## 2022-01-26 DIAGNOSIS — R41844 Frontal lobe and executive function deficit: Secondary | ICD-10-CM | POA: Diagnosis present

## 2022-01-26 DIAGNOSIS — M79642 Pain in left hand: Secondary | ICD-10-CM | POA: Diagnosis present

## 2022-01-26 DIAGNOSIS — R41840 Attention and concentration deficit: Secondary | ICD-10-CM | POA: Insufficient documentation

## 2022-01-26 NOTE — Therapy (Signed)
OUTPATIENT PHYSICAL THERAPY NEURO EVALUATION   Patient Name: Roberta Calderon MRN: [id number] DOB:[birth year], 51 y.o., female Today's Date: 01/26/2022   PCP: Genice Rouge REFERRING PROVIDER: Juluis Mire, NP   PT End of Session - 01/26/22 0947     Visit Number 1    Number of Visits 17    Date for PT Re-Evaluation 03/23/22    Authorization Type Self pay    Authorization Time Period Eval 01/26/22    PT Start Time 0845    PT Stop Time 0930    PT Time Calculation (min) 45 min    Equipment Utilized During Treatment Gait belt    Activity Tolerance Patient tolerated treatment well    Behavior During Therapy WFL for tasks assessed/performed             Past Medical History:  Diagnosis Date   Hypertension    History reviewed. No pertinent surgical history. Patient Active Problem List   Diagnosis Date Noted   Cerebrovascular accident (CVA) of right basal ganglia ([place]) 08/14/2021   Hypertensive urgency 08/14/2021   Cocaine abuse ([place]) 25/12/3974   Metabolic syndrome 73/41/9379   Right sided weakness 08/14/2021   Alcohol abuse 08/14/2021   Prolonged QT interval 08/14/2021   ICH (intracerebral hemorrhage) ([place]) 02/07/2021   Dyslipidemia    Right thalamic infarction ([place]) 04/08/2020   Prediabetes    Polysubstance abuse ([place])    Hemiparesis affecting left side as late effect of stroke ([place])    Ischemic stroke ([place]) 04/06/2020   Hypertension 04/06/2020   Tobacco use disorder 04/06/2020    ONSET DATE: 08/18/21  REFERRING DIAG: I63.81 (ICD-10-CM) - Cerebrovascular accident (CVA) of right basal ganglia (HCC)   THERAPY DIAG:  Other abnormalities of gait and mobility  Muscle weakness (generalized)  Rationale for Evaluation and Treatment Rehabilitation  SUBJECTIVE:                                                                                                                                                                                               SUBJECTIVE STATEMENT: Pt reports of 2 strokes. They were in June and December of 2001. Pt reports of weakness in L UE and LE since her last stroke. She falls multiple times at home. She drops things from her left hand. Pt is right handed. Pt reports mutlple skin bruises from falls but no fractures. Pt accompanied by: self  PERTINENT HISTORY: substance abuse, HTN, hx of strokes  PAIN:  Are you having pain? Yes: NPRS scale: 6/10 Pain location: L hand Pain description: burning sensation Aggravating factors: activity Relieving factors: bracing  PRECAUTIONS: Fall  WEIGHT  BEARING RESTRICTIONS No  FALLS: Has patient fallen in last 6 months? Yes. Number of falls too many per patient  LIVING ENVIRONMENT: Lives with:  with mother and foster kids Lives in: House/apartment Stairs: Yes: Internal: 2 steps; none and External: 2 steps; bilateral but cannot reach both Has following equipment at home: [place] - 2 wheeled  PLOF: Needs assistance with homemaking and Needs assistance with gait  PATIENT GOALS walk better  OBJECTIVE:   COGNITION: Overall cognitive status: Within functional limits for tasks assessed   SENSATION: Not tested   LOWER EXTREMITY MMT:    MMT Right Eval Left Eval  Hip flexion 4/5 2+/5  Hip extension    Hip abduction 4/5 3+/5  Hip adduction    Hip internal rotation    Hip external rotation    Knee flexion 5/5 3-/5  Knee extension 5/5 3-5  Ankle dorsiflexion 5/5 3+/5  Ankle plantarflexion    Ankle inversion    Ankle eversion    (Blank rows = not tested)  BED MOBILITY: Independent   TRANSFERS: Assistive device utilized: None  Sit to stand: SBA Stand to sit: SBA Chair to chair: SBA   STAIRS:  Level of Assistance: SBA  Stair Negotiation Technique: Alternating Pattern  with Bilateral Rails  Number of Stairs: 4   Height of Stairs: 6"  Comments: increased UE use  GAIT: Gait pattern:  decreased cadence, decreased arm swing- Right, decreased  stride length, wide BOS, poor foot clearance- Right, and poor foot clearance- Left Distance walked: 230' Assistive device utilized: None Level of assistance: SBA   FUNCTIONAL TESTs:  5 times sit to stand: 49 s Berg Balance Scale: 47/56 moderate risk of fall Functional gait assessment: 11/30 without AD, high risk for fall    TODAY'S TREATMENT:  Discussed goals and POC with patient. Discussed importance of compliance with keeping therapy appts and HEP to demonstrate significant progress.   PATIENT EDUCATION: Education details: See above Person educated: Patient Education method: Explanation Education comprehension: verbalized understanding   HOME EXERCISE PROGRAM: TBD    GOALS: Goals reviewed with patient? Yes  SHORT TERM GOALS: Target date: 02/23/2022  Pt will demo >50/56 on BBS to improve standing balance Baseline:47/56 (01/26/22) Goal status: INITIAL  2.  Pt will be consistent at using her RW at home and in community at least 75% of the time to improve safety and reduce risk of fall where she reports she is falling <2x/week with near falls or falls. Baseline: only using walker intermittently at night, experiences near falls/falss multiple times during the week Goal status: INITIAL  3.  Pt will report 50% compliance with HEP to improve indepdenet management. Baseline: TBD Goal status: INITIAL   LONG TERM GOALS: Target date: 03/23/2022  Pt will demo >18/30 on FGA to improve functional balance with ambulation and reduce fall risk Baseline: 11/30(01/26/22) Goal status: INITIAL  2.  Pt will demo 5x sit to stand <30 sec to improve functional strength without use of UE support from standard chair Baseline: 49 sec without HHA Goal status: INITIAL  3.  Pt will report <2 falls/near falls/per month to improve safety awareness and reduce fall risk Baseline: multiple falls in a month (01/26/22) Goal status: INITIAL  4.  Pt will be able to ambulate on grass 200 feet to improve  commnity access with LRAD to improve safety. Baseline: not tried. Goal status: INITIAL   ASSESSMENT:  CLINICAL IMPRESSION: Patient is a 51 y.o. female who was seen today for physical therapy evaluation and  treatment for gait and mobility disorder due to hx of multiple strokes..    OBJECTIVE IMPAIRMENTS Abnormal gait, decreased activity tolerance, decreased balance, decreased endurance, decreased mobility, difficulty walking, decreased strength, decreased safety awareness, impaired flexibility, impaired UE functional use, improper body mechanics, postural dysfunction, and pain.   ACTIVITY LIMITATIONS carrying, lifting, bending, sitting, standing, squatting, stairs, and transfers  PARTICIPATION LIMITATIONS: meal prep, cleaning, laundry, shopping, and community activity  PERSONAL FACTORS Age, Past/current experiences, Time since onset of injury/illness/exacerbation, and 1-2 comorbidities: hx of multiple strokes, hx of substance abuse  are also affecting patient's functional outcome.   REHAB POTENTIAL: Fair chronicity of strokes  CLINICAL DECISION MAKING: Stable/uncomplicated  EVALUATION COMPLEXITY: Low  PLAN: PT FREQUENCY: 2x/week  PT DURATION: 8 weeks  PLANNED INTERVENTIONS: Therapeutic exercises, Therapeutic activity, Neuromuscular re-education, Balance training, Gait training, Patient/Family education, Joint mobilization, Stair training, Orthotic/Fit training, Electrical stimulation, Spinal mobilization, Cryotherapy, Moist heat, Manual therapy, and Re-evaluation  PLAN FOR NEXT SESSION: Is pt using walker consisitently at home, Issue HEP (sit to stand, SLS at countertop), Check on OT eval and order from MD   Kerrie Pleasure, PT 01/26/2022, 9:48 AM

## 2022-01-31 ENCOUNTER — Ambulatory Visit: Payer: Medicaid Other

## 2022-01-31 ENCOUNTER — Telehealth: Payer: Self-pay

## 2022-01-31 DIAGNOSIS — I6381 Other cerebral infarction due to occlusion or stenosis of small artery: Secondary | ICD-10-CM

## 2022-01-31 DIAGNOSIS — R2689 Other abnormalities of gait and mobility: Secondary | ICD-10-CM

## 2022-01-31 DIAGNOSIS — M6281 Muscle weakness (generalized): Secondary | ICD-10-CM

## 2022-01-31 NOTE — Telephone Encounter (Signed)
Roberta Mire, NP,   Margaretha Sheffield is being treated by physical therapy for gait abnormalities.  They will benefit from use of standard 2-wheeled rolling walker in order to improve safety with functional mobility.   Also, you see this patient tomorrow  02/01/2022. Can you please state the need for this rolling walker in your note to allow for insurance coverage. We are unable to obtain her a rolling walker if medical necessity is not documented.   If you agree, please place an order in [facility] in [facility] or fax the order to [phone].  Please note, her blood pressure was significantly elevated throughout our session. It is documented in our last therapy note.   Thank you, Debbora Dus, PT, DPT, [facility] [address] [place], [place]  [address] Phone:  [phone] Fax:  [phone]

## 2022-01-31 NOTE — Therapy (Addendum)
OUTPATIENT PHYSICAL THERAPY TREATMENT NOTE   Patient Name: Roberta Calderon MRN: [id number] DOB:1971-08-12, 51 y.o., female Today's Date: 01/31/2022  PCP: Juluis Mire, NP REFERRING PROVIDER: Juluis Mire, NP  END OF SESSION:   PT End of Session - 01/31/22 0845     Visit Number 2    Number of Visits 17    Date for PT Re-Evaluation 03/23/22    Authorization Type Self pay    Authorization Time Period Eval 01/26/22    PT Start Time 0845    PT Stop Time 0920    PT Time Calculation (min) 35 min    Equipment Utilized During Treatment Other (comment)   RW   Activity Tolerance Treatment limited secondary to medical complications (Comment)   increased BP   Behavior During Therapy Flat affect             Past Medical History:  Diagnosis Date   Hypertension    History reviewed. No pertinent surgical history. Patient Active Problem List   Diagnosis Date Noted   Cerebrovascular accident (CVA) of right basal ganglia ([place]) 08/14/2021   Hypertensive urgency 08/14/2021   Cocaine abuse ([place]) 57/84/6962   Metabolic syndrome 95/28/4132   Right sided weakness 08/14/2021   Alcohol abuse 08/14/2021   Prolonged QT interval 08/14/2021   ICH (intracerebral hemorrhage) ([place]) 02/07/2021   Dyslipidemia    Right thalamic infarction ([place]) 04/08/2020   Prediabetes    Polysubstance abuse ([place])    Hemiparesis affecting left side as late effect of stroke ([place])    Ischemic stroke ([place]) 04/06/2020   Hypertension 04/06/2020   Tobacco use disorder 04/06/2020    REFERRING DIAG: REFERRING DIAG: I63.81 (ICD-10-CM) - Cerebrovascular accident (CVA) of right basal ganglia ([place])   THERAPY DIAG:  Other abnormalities of gait and mobility  Muscle weakness (generalized)  Right thalamic infarction ([place])  Rationale for Evaluation and Treatment Rehabilitation  PERTINENT HISTORY: substance abuse, HTN, hx of strokes  PRECAUTIONS: fall  SUBJECTIVE: Patient reports doing well, but did have a fall  over the weekend. She says that she lost her balance and "just went over," mother was present to assist with getting up off the floor. She denies hitting her head. She does report trouble sleeping since last month. Plans to mention it to the dr tomorrow.   PAIN:  Are you having pain? No  VITAL SIGNS:  6/7: BP 173/109   TODAY'S TREATMENT:  -Gait  -115'x2 with RW and SBA decreased postural sway; patient reporting that she feels steadier when using RW as well   -BP after gait: 150/106 (patient reports not taking her BP rx this AM as she "forgot")   -BP after 2' rest: 168/99  -BP after 2' rest: 159/91 -Theract  -education on use of RW, rx compliance, fall precaution, STS form using RW.   -Blocked practice STS x4 with verbal cues for proper form   -patient reporting feeling lightheaded: BP 153/87 -Therex:  -2x10 marches at counter  -2x10 heel raises   -BP 173/109     PATIENT EDUCATION: Education details: use of RW, compliance with Rx, home safety regarding LOB and risk for falling.  Person educated: Patient Education method: Explanation Education comprehension: verbalized understanding; requires further education /reinforcement     HOME EXERCISE PROGRAM: Access Code: [email or web address] URL: [email or web address] Date: 01/31/2022 Prepared by: Estevan Ryder  Exercises - Sit to Stand with Armchair  - 1 x daily - 7 x weekly - 3 sets - 10 reps - Standing March with Counter Support  -  1 x daily - 7 x weekly - 3 sets - 10 reps - Heel Raises with Counter Support  - 1 x daily - 7 x weekly - 3 sets - 10 reps    GOALS: Goals reviewed with patient? Yes   SHORT TERM GOALS: Target date: 02/23/2022   Pt will demo >50/56 on BBS to improve standing balance Baseline:47/56 (01/26/22) Goal status: INITIAL   2.  Pt will be consistent at using her RW at home and in community at least 75% of the time to improve safety and reduce risk of fall where she reports she is falling <2x/week with  near falls or falls. Baseline: only using walker intermittently at night, experiences near falls/falss multiple times during the week Goal status: INITIAL   3.  Pt will report 50% compliance with HEP to improve indepdenet management. Baseline: TBD Goal status: INITIAL     LONG TERM GOALS: Target date: 03/23/2022   Pt will demo >18/30 on FGA to improve functional balance with ambulation and reduce fall risk Baseline: 11/30(01/26/22) Goal status: INITIAL   2.  Pt will demo 5x sit to stand <30 sec to improve functional strength without use of UE support from standard chair Baseline: 49 sec without HHA Goal status: INITIAL   3.  Pt will report <2 falls/near falls/per month to improve safety awareness and reduce fall risk Baseline: multiple falls in a month (01/26/22) Goal status: INITIAL   4.  Pt will be able to ambulate on grass 200 feet to improve commnity access with LRAD to improve safety. Baseline: not tried. Goal status: INITIAL     ASSESSMENT:   CLINICAL IMPRESSION: Patient seen for skilled PT session with emphasis on safety education, balance re-training and gait training with RW. Patient reporting fall this weekend, denies hitting head or injury. PT advising patient to inform MD of fall when she has a visit tomorrow. BP elevated after short distance gait. After rest, BP returning with diastolic below 416. Extensive education provided regarding rx compliance and risk for additional stroke. Patient appearing minimally receptive to this education. PT providing patient with brief HEP. After going through HEP, BP elevated again. PT deciding to terminate session due to consistent increase in BP with very minimal activity. PT educating patient again on importance of rx compliance.      OBJECTIVE IMPAIRMENTS Abnormal gait, decreased activity tolerance, decreased balance, decreased endurance, decreased mobility, difficulty walking, decreased strength, decreased safety awareness, impaired  flexibility, impaired UE functional use, improper body mechanics, postural dysfunction, and pain.    ACTIVITY LIMITATIONS carrying, lifting, bending, sitting, standing, squatting, stairs, and transfers   PARTICIPATION LIMITATIONS: meal prep, cleaning, laundry, shopping, and community activity   PERSONAL FACTORS Age, Past/current experiences, Time since onset of injury/illness/exacerbation, and 1-2 comorbidities: hx of multiple strokes, hx of substance abuse  are also affecting patient's functional outcome.    REHAB POTENTIAL: Fair chronicity of strokes   CLINICAL DECISION MAKING: Stable/uncomplicated   EVALUATION COMPLEXITY: Low   PLAN: PT FREQUENCY: 2x/week   PT DURATION: 8 weeks   PLANNED INTERVENTIONS: Therapeutic exercises, Therapeutic activity, Neuromuscular re-education, Balance training, Gait training, Patient/Family education, Joint mobilization, Stair training, Orthotic/Fit training, Electrical stimulation, Spinal mobilization, Cryotherapy, Moist heat, Manual therapy, and Re-evaluation   PLAN FOR NEXT SESSION: Is pt using walker consisitently at home, how's BP and rx compliance?, dynamic gait balance, endurance tx  Debbora Dus, PT Debbora Dus, PT, DPT, CBIS  01/31/2022, 9:29 AM

## 2022-02-01 ENCOUNTER — Ambulatory Visit (INDEPENDENT_AMBULATORY_CARE_PROVIDER_SITE_OTHER): Payer: Medicaid Other | Admitting: Primary Care

## 2022-02-01 ENCOUNTER — Encounter (INDEPENDENT_AMBULATORY_CARE_PROVIDER_SITE_OTHER): Payer: Self-pay | Admitting: Primary Care

## 2022-02-01 VITALS — BP 165/94 | HR 65 | Temp 97.8°F | Ht 63.0 in | Wt 132.6 lb

## 2022-02-01 DIAGNOSIS — I10 Essential (primary) hypertension: Secondary | ICD-10-CM

## 2022-02-01 DIAGNOSIS — R7303 Prediabetes: Secondary | ICD-10-CM

## 2022-02-01 DIAGNOSIS — I6381 Other cerebral infarction due to occlusion or stenosis of small artery: Secondary | ICD-10-CM | POA: Diagnosis not present

## 2022-02-01 DIAGNOSIS — E119 Type 2 diabetes mellitus without complications: Secondary | ICD-10-CM

## 2022-02-01 LAB — POCT GLYCOSYLATED HEMOGLOBIN (HGB A1C): Hemoglobin A1C: 6.7 % — AB (ref 4.0–5.6)

## 2022-02-01 MED ORDER — HYDROCHLOROTHIAZIDE 25 MG PO TABS: 25.0000 mg | ORAL_TABLET | Freq: Every day | ORAL | 3 refills | Status: DC

## 2022-02-01 MED ORDER — METFORMIN HCL ER 500 MG PO TB24: 500.0000 mg | ORAL_TABLET | Freq: Every day | ORAL | 1 refills | Status: DC

## 2022-02-01 NOTE — Progress Notes (Signed)
physical therapy for gait abnormalities.  They will benefit from use of standard 2-wheeled rolling walker in order to improve safety with functional mobility.

## 2022-02-01 NOTE — Patient Instructions (Signed)

## 2022-02-02 ENCOUNTER — Ambulatory Visit: Payer: Medicaid Other

## 2022-02-02 DIAGNOSIS — R2689 Other abnormalities of gait and mobility: Secondary | ICD-10-CM

## 2022-02-02 DIAGNOSIS — M6281 Muscle weakness (generalized): Secondary | ICD-10-CM

## 2022-02-02 DIAGNOSIS — I6381 Other cerebral infarction due to occlusion or stenosis of small artery: Secondary | ICD-10-CM

## 2022-02-02 NOTE — Therapy (Signed)
Patient Name: Roberta Calderon MRN: [id number] DOB:03-01-1971, 51 y.o., female Today's Date: 02/02/2022  02/02/22 Pt reports she almost fell at [place] yesterday. She saw MD and they increased her BP medications. She was also diagnosed with DM for which she has to start taking medication for. BP: 170/101 Pt has not started taking her increased BP medications yet. Pt educated that above BP is too high for therapy as with exercises BP will increase Pt educated on start taking her medication today to help decrease her BP to within safe ranges for theapy. Pt educated on keeping a BP diary where she measures BP before taking her meds, 2-3 hours after taking meds and third time in the evening. Pt does not have BP machine at home. Pt was educated to inquire at pharmacy to see if Medicaid reimburses for BP machine for home use and then call her doctor to send over prescription. Pt given resources on getting a [facility] from [place] and educated her to bring it in so we can adjust it for her next session. NO charge for today's session   Next session: [place], PT 02/02/2022, 10:22 AM

## 2022-02-04 ENCOUNTER — Encounter (HOSPITAL_COMMUNITY): Payer: Self-pay

## 2022-02-04 ENCOUNTER — Emergency Department (HOSPITAL_COMMUNITY): Payer: Medicaid Other

## 2022-02-04 ENCOUNTER — Observation Stay (HOSPITAL_COMMUNITY)
Admission: EM | Admit: 2022-02-04 | Discharge: 2022-02-06 | Disposition: A | Discharge status: Home / Self Care | Payer: Medicaid Other | Attending: Internal Medicine | Admitting: Internal Medicine

## 2022-02-04 ENCOUNTER — Other Ambulatory Visit: Payer: Self-pay

## 2022-02-04 ENCOUNTER — Other Ambulatory Visit (INDEPENDENT_AMBULATORY_CARE_PROVIDER_SITE_OTHER): Payer: Self-pay | Admitting: Primary Care

## 2022-02-04 DIAGNOSIS — I639 Cerebral infarction, unspecified: Secondary | ICD-10-CM | POA: Diagnosis not present

## 2022-02-04 DIAGNOSIS — Z7984 Long term (current) use of oral hypoglycemic drugs: Secondary | ICD-10-CM | POA: Insufficient documentation

## 2022-02-04 DIAGNOSIS — E119 Type 2 diabetes mellitus without complications: Secondary | ICD-10-CM | POA: Insufficient documentation

## 2022-02-04 DIAGNOSIS — F17210 Nicotine dependence, cigarettes, uncomplicated: Secondary | ICD-10-CM | POA: Diagnosis not present

## 2022-02-04 DIAGNOSIS — Z8673 Personal history of transient ischemic attack (TIA), and cerebral infarction without residual deficits: Secondary | ICD-10-CM | POA: Insufficient documentation

## 2022-02-04 DIAGNOSIS — I10 Essential (primary) hypertension: Secondary | ICD-10-CM | POA: Insufficient documentation

## 2022-02-04 DIAGNOSIS — Z79899 Other long term (current) drug therapy: Secondary | ICD-10-CM | POA: Insufficient documentation

## 2022-02-04 DIAGNOSIS — I69354 Hemiplegia and hemiparesis following cerebral infarction affecting left non-dominant side: Secondary | ICD-10-CM

## 2022-02-04 DIAGNOSIS — R200 Anesthesia of skin: Secondary | ICD-10-CM | POA: Diagnosis present

## 2022-02-04 DIAGNOSIS — R7303 Prediabetes: Secondary | ICD-10-CM

## 2022-02-04 DIAGNOSIS — Z20822 Contact with and (suspected) exposure to covid-19: Secondary | ICD-10-CM | POA: Diagnosis not present

## 2022-02-04 DIAGNOSIS — Z7902 Long term (current) use of antithrombotics/antiplatelets: Secondary | ICD-10-CM | POA: Diagnosis not present

## 2022-02-04 LAB — TROPONIN I (HIGH SENSITIVITY): Troponin I (High Sensitivity): 6 ng/L (ref <18)

## 2022-02-04 LAB — COMPREHENSIVE METABOLIC PANEL
ALT: 22 U/L (ref 0–44)
AST: 21 U/L (ref 15–41)
Albumin: 4.1 g/dL (ref 3.5–5.0)
Alkaline Phosphatase: 95 U/L (ref 38–126)
Anion gap: 10 (ref 5–15)
BUN: 16 mg/dL (ref 6–20)
CO2: 25 mmol/L (ref 22–32)
Calcium: 10.2 mg/dL (ref 8.9–10.3)
Chloride: 104 mmol/L (ref 98–111)
Creatinine, Ser: 0.87 mg/dL (ref 0.44–1.00)
GFR, Estimated: >60 mL/min (ref >60)
Glucose, Bld: 99 mg/dL (ref 70–99)
Potassium: 3.5 mmol/L (ref 3.5–5.1)
Sodium: 139 mmol/L (ref 135–145)
Total Bilirubin: 0.8 mg/dL (ref 0.3–1.2)
Total Protein: 7.7 g/dL (ref 6.5–8.1)

## 2022-02-04 LAB — CBC WITH DIFFERENTIAL/PLATELET
Abs Immature Granulocytes: 0.01 10*3/uL (ref 0.00–0.07)
Basophils Absolute: 0 10*3/uL (ref 0.0–0.1)
Basophils Relative: 1 %
Eosinophils Absolute: 0.2 10*3/uL (ref 0.0–0.5)
Eosinophils Relative: 3 %
HCT: 45.2 % (ref 36.0–46.0)
Hemoglobin: 14 g/dL (ref 12.0–15.0)
Immature Granulocytes: 0 %
Lymphocytes Relative: 53 %
Lymphs Abs: 3.1 10*3/uL (ref 0.7–4.0)
MCH: 24.5 pg — ABNORMAL LOW (ref 26.0–34.0)
MCHC: 31 g/dL (ref 30.0–36.0)
MCV: 79.2 fL — ABNORMAL LOW (ref 80.0–100.0)
Monocytes Absolute: 0.4 10*3/uL (ref 0.1–1.0)
Monocytes Relative: 7 %
Neutro Abs: 2.1 10*3/uL (ref 1.7–7.7)
Neutrophils Relative %: 36 %
Platelets: 339 10*3/uL (ref 150–400)
RBC: 5.71 MIL/uL — ABNORMAL HIGH (ref 3.87–5.11)
RDW: 13.1 % (ref 11.5–15.5)
WBC: 5.8 10*3/uL (ref 4.0–10.5)
nRBC: 0 % (ref 0.0–0.2)

## 2022-02-04 LAB — RESP PANEL BY RT-PCR (FLU A&B, COVID) ARPGX2
Influenza A by PCR: NEGATIVE
Influenza B by PCR: NEGATIVE
SARS Coronavirus 2 by RT PCR: NEGATIVE

## 2022-02-04 LAB — PROTIME-INR
INR: 1 (ref 0.8–1.2)
Prothrombin Time: 12.7 seconds (ref 11.4–15.2)

## 2022-02-04 LAB — APTT: aPTT: 31 seconds (ref 24–36)

## 2022-02-04 LAB — ETHANOL: Alcohol, Ethyl (B): <10 mg/dL (ref <10)

## 2022-02-04 IMAGING — CT CT HEAD W/O CM
2 of 4 series · 14 of 47 positions shown, 17 images · non-contrast
Comparison: [DATE]

CLINICAL DATA: TIA



[Series 5: head 3.0 mpr cor · coronal · 0.29mm/px · 3 of 67 slices shown]
[im 23/67  brain]
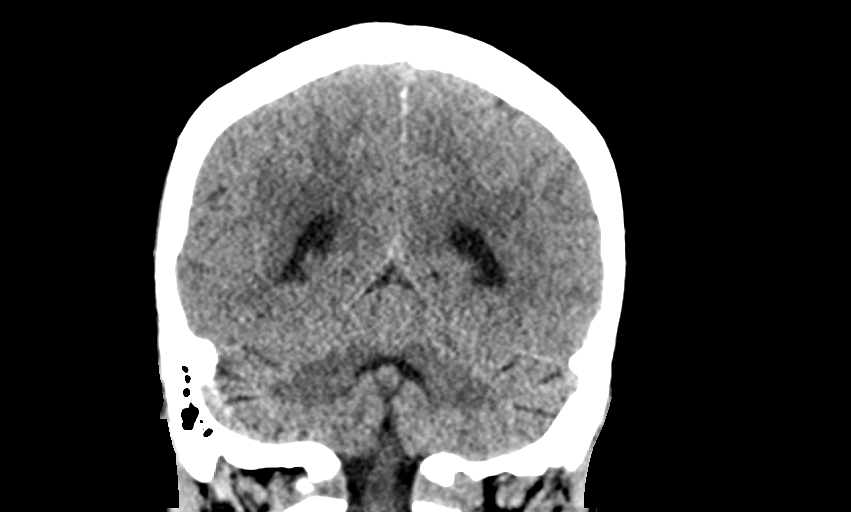
[im 30/67  brain]
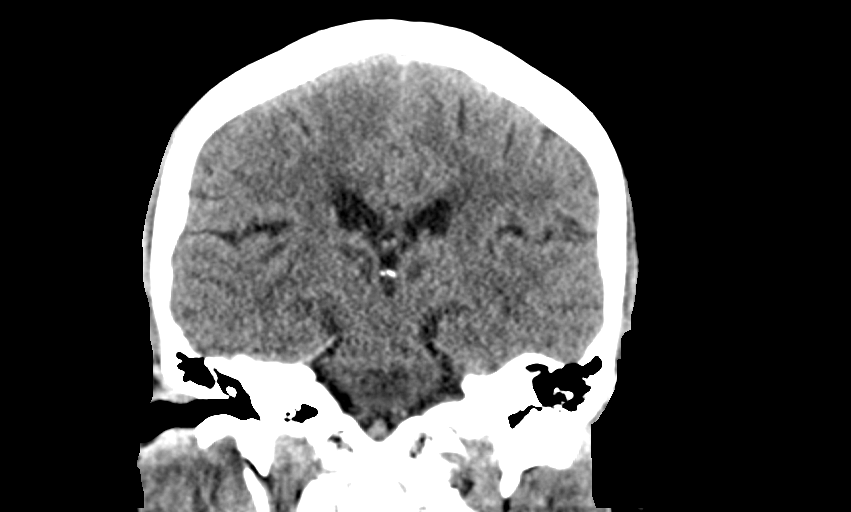
[im 37/67  brain]
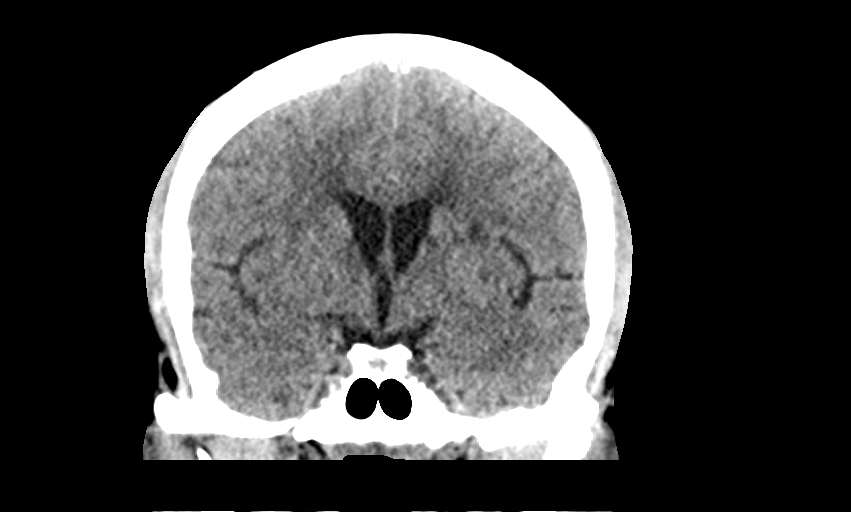

[Series 7: head 5.0 h30s · axial · 0.42mm/px · z∈[-197,-77]mm · 11 of 30 slices shown, 14 images]
[im 3/30  brain]
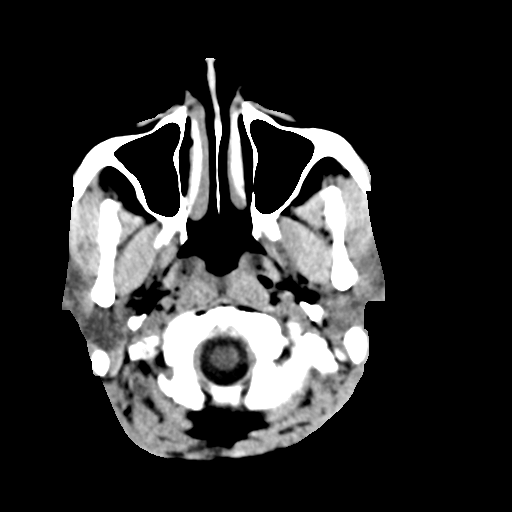
[im 3/30  bone]
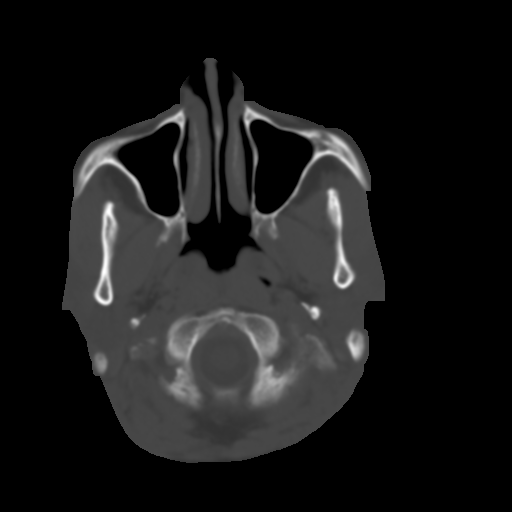
[im 5/30  brain]
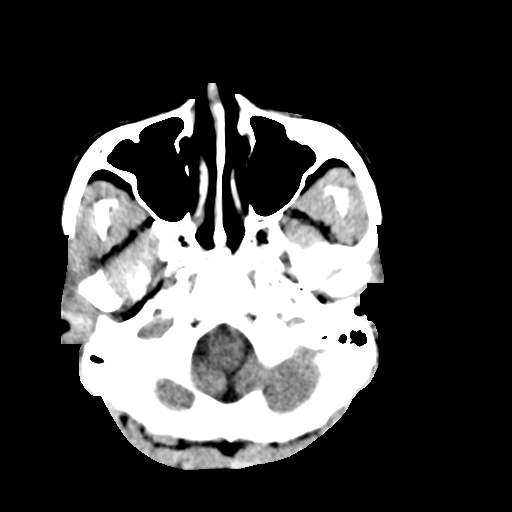
[im 7/30  brain]
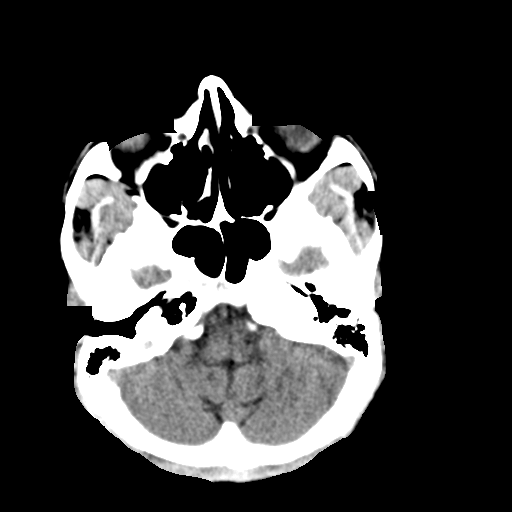
[im 11/30  brain]
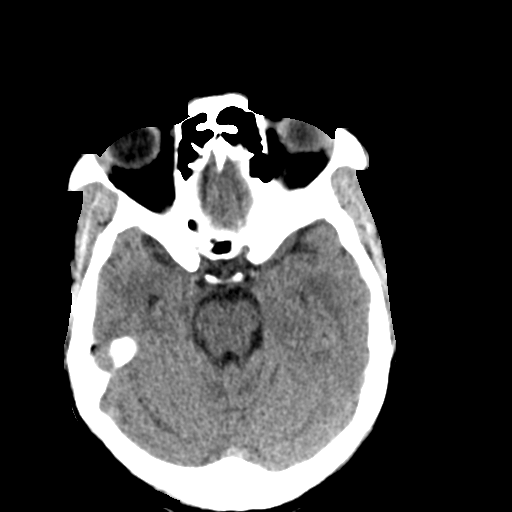
[im 13/30  brain]
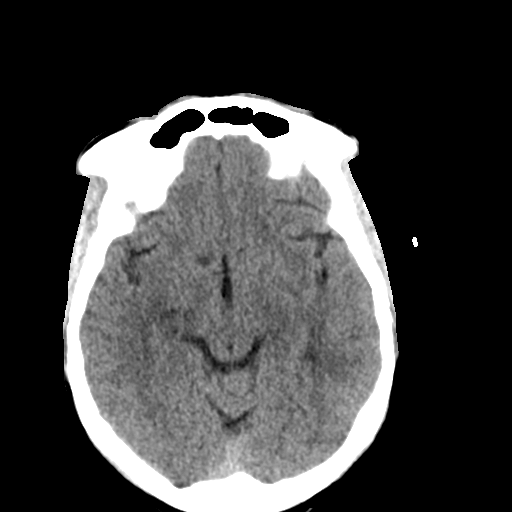
[im 13/30  bone]
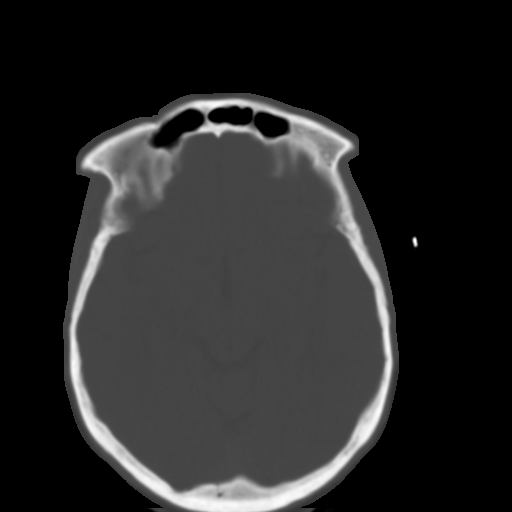
[im 15/30  brain]
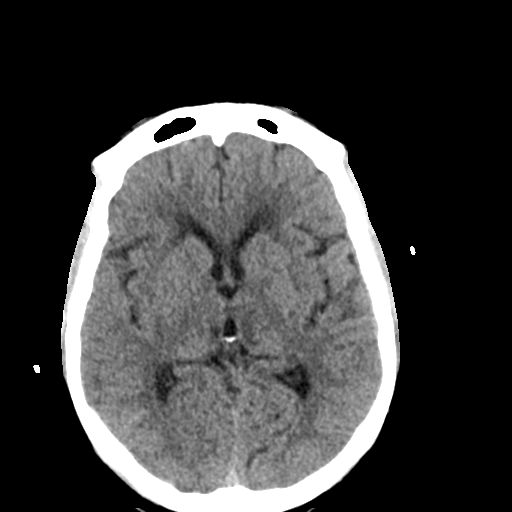
[im 17/30  brain]
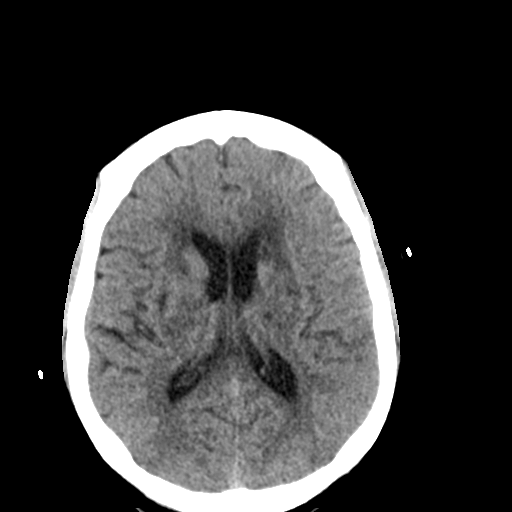
[im 19/30  brain]
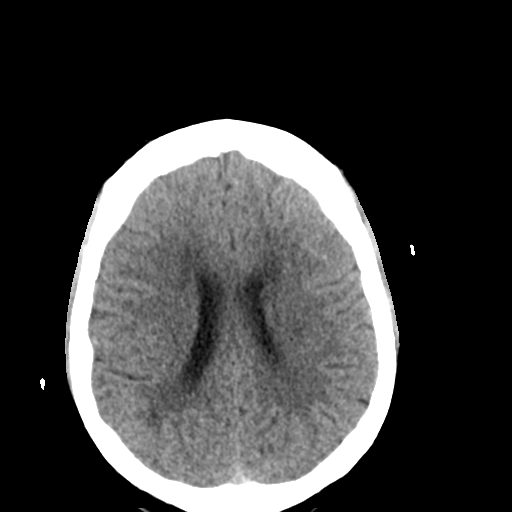
[im 23/30  brain]
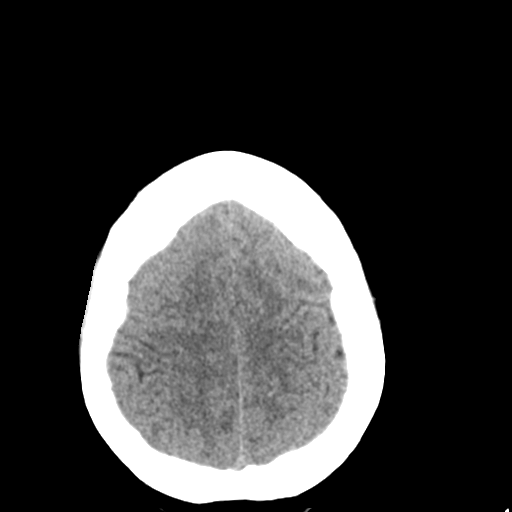
[im 23/30  bone]
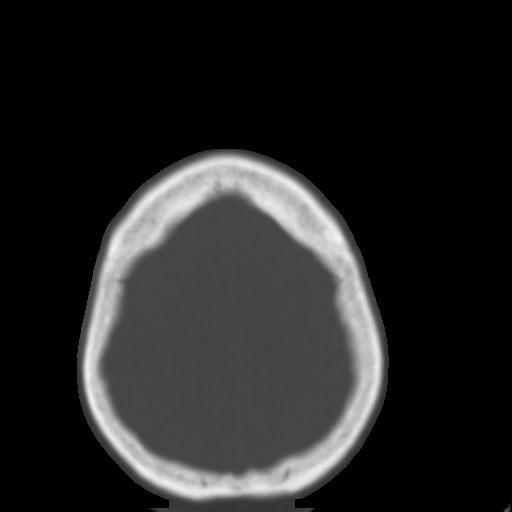
[im 25/30  brain]
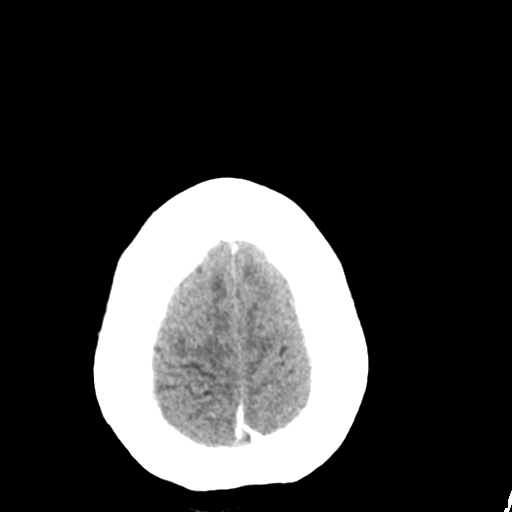
[im 27/30  brain]
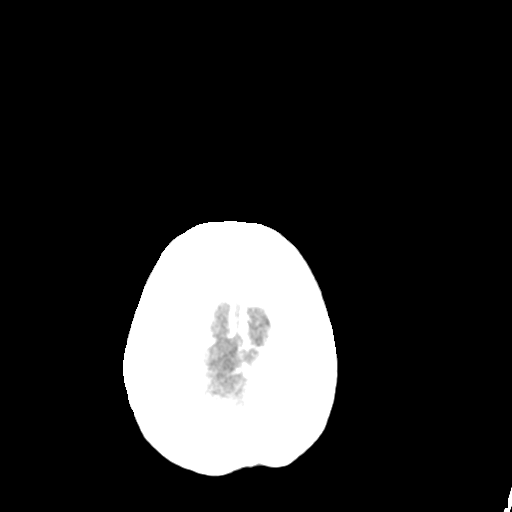

[14 of 47 positions shown; findings below may reference images not displayed]

FINDINGS: Brain: No acute intracranial findings are seen. There is interval
resolution of small focus bleeding in the left frontal lobe adjacent
to the posterior aspect of left sylvian fissure since [DATE]. In
the current study, there are no signs of bleeding within the
cranium. There is decreased density in the periventricular and
subcortical white matter suggesting small-vessel disease. Small old
lacunar infarcts are seen in the basal ganglia with no significant
change.

Vascular: Unremarkable.

Skull: Unremarkable.

Sinuses/Orbits: Unremarkable.

Other: None.
IMPRESSION: No acute intracranial findings are seen in the noncontrast CT brain.
Atrophy. Small-vessel disease. Small old lacunar infarcts in the
basal ganglia appear stable.

## 2022-02-04 IMAGING — DX DG CHEST 2V
2 series · 2 of 2 positions shown · non-contrast
Comparison: [DATE]

CLINICAL DATA: Chest pain

EXAM:
CHEST - 2 VIEW

[chest lat]
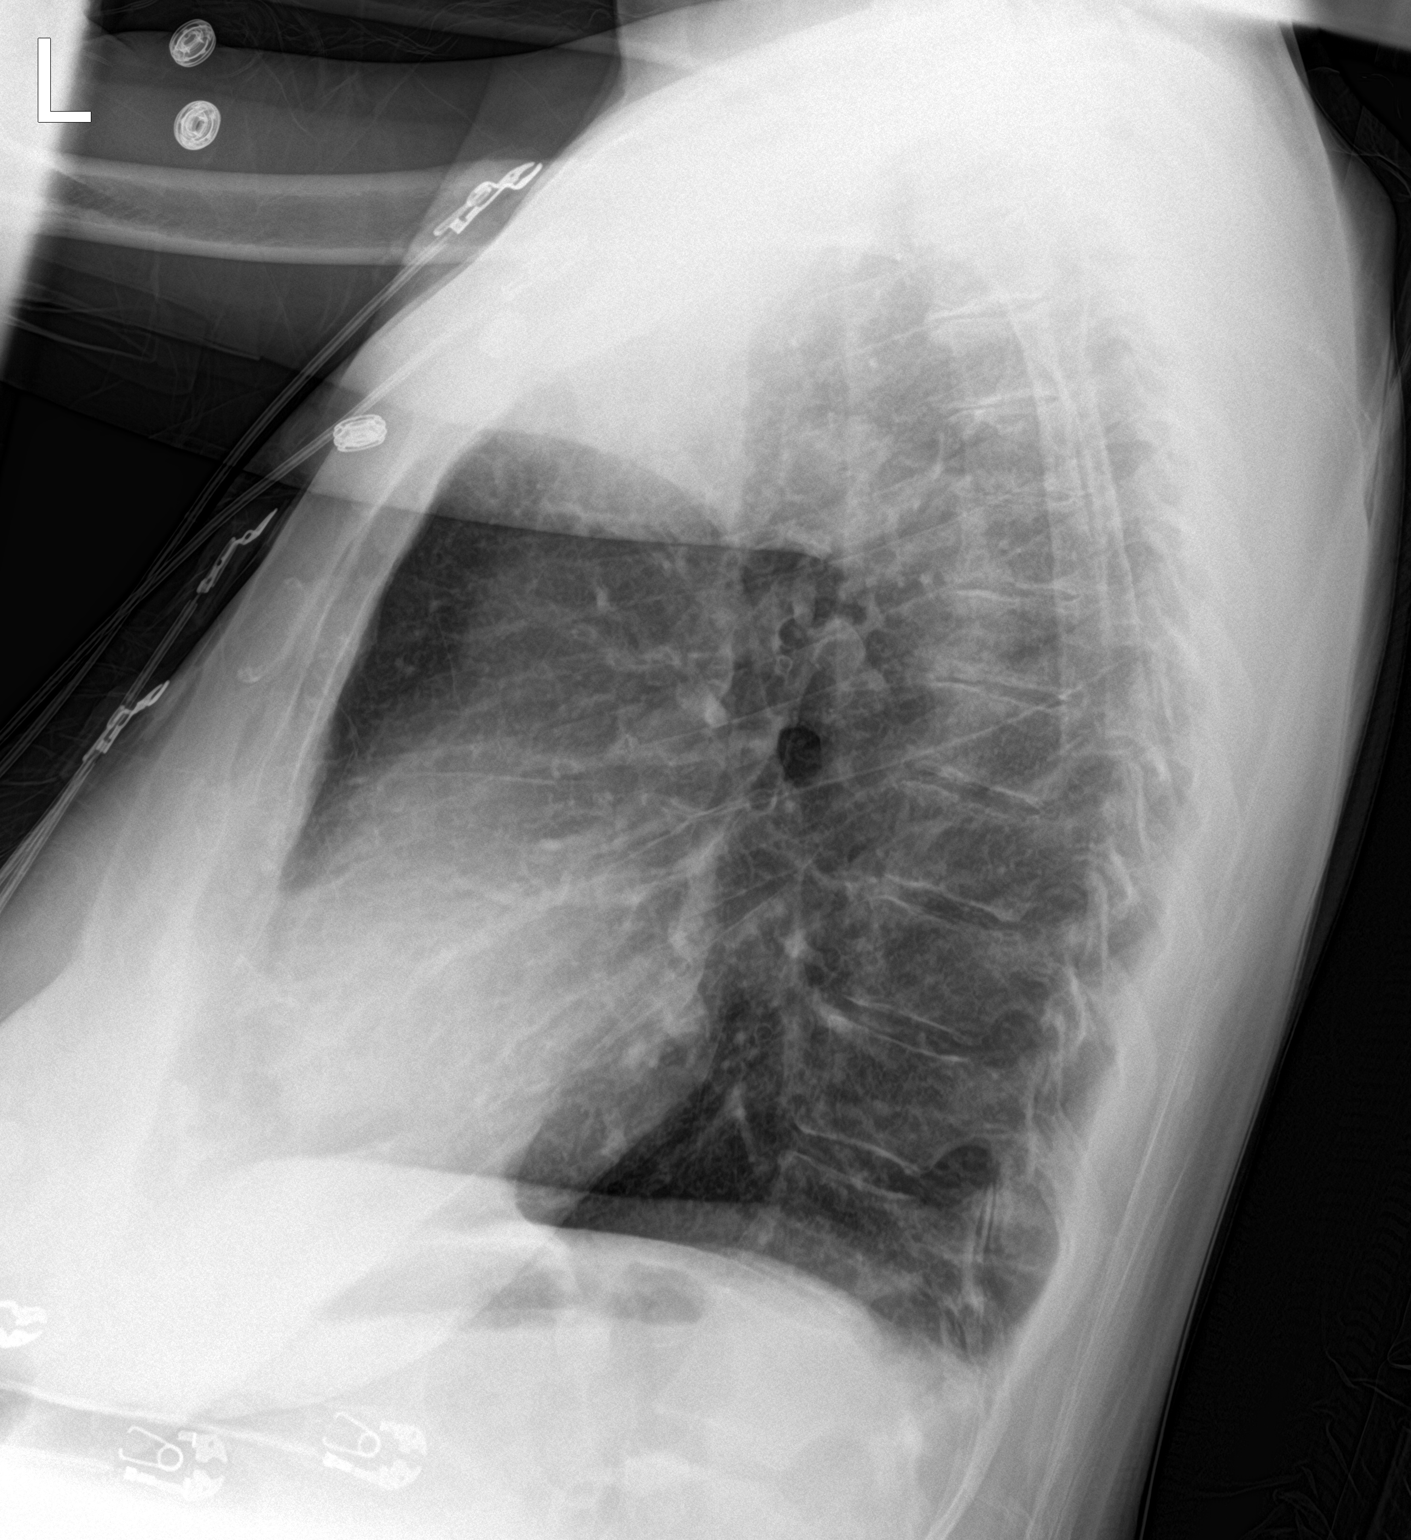

[chest ap]
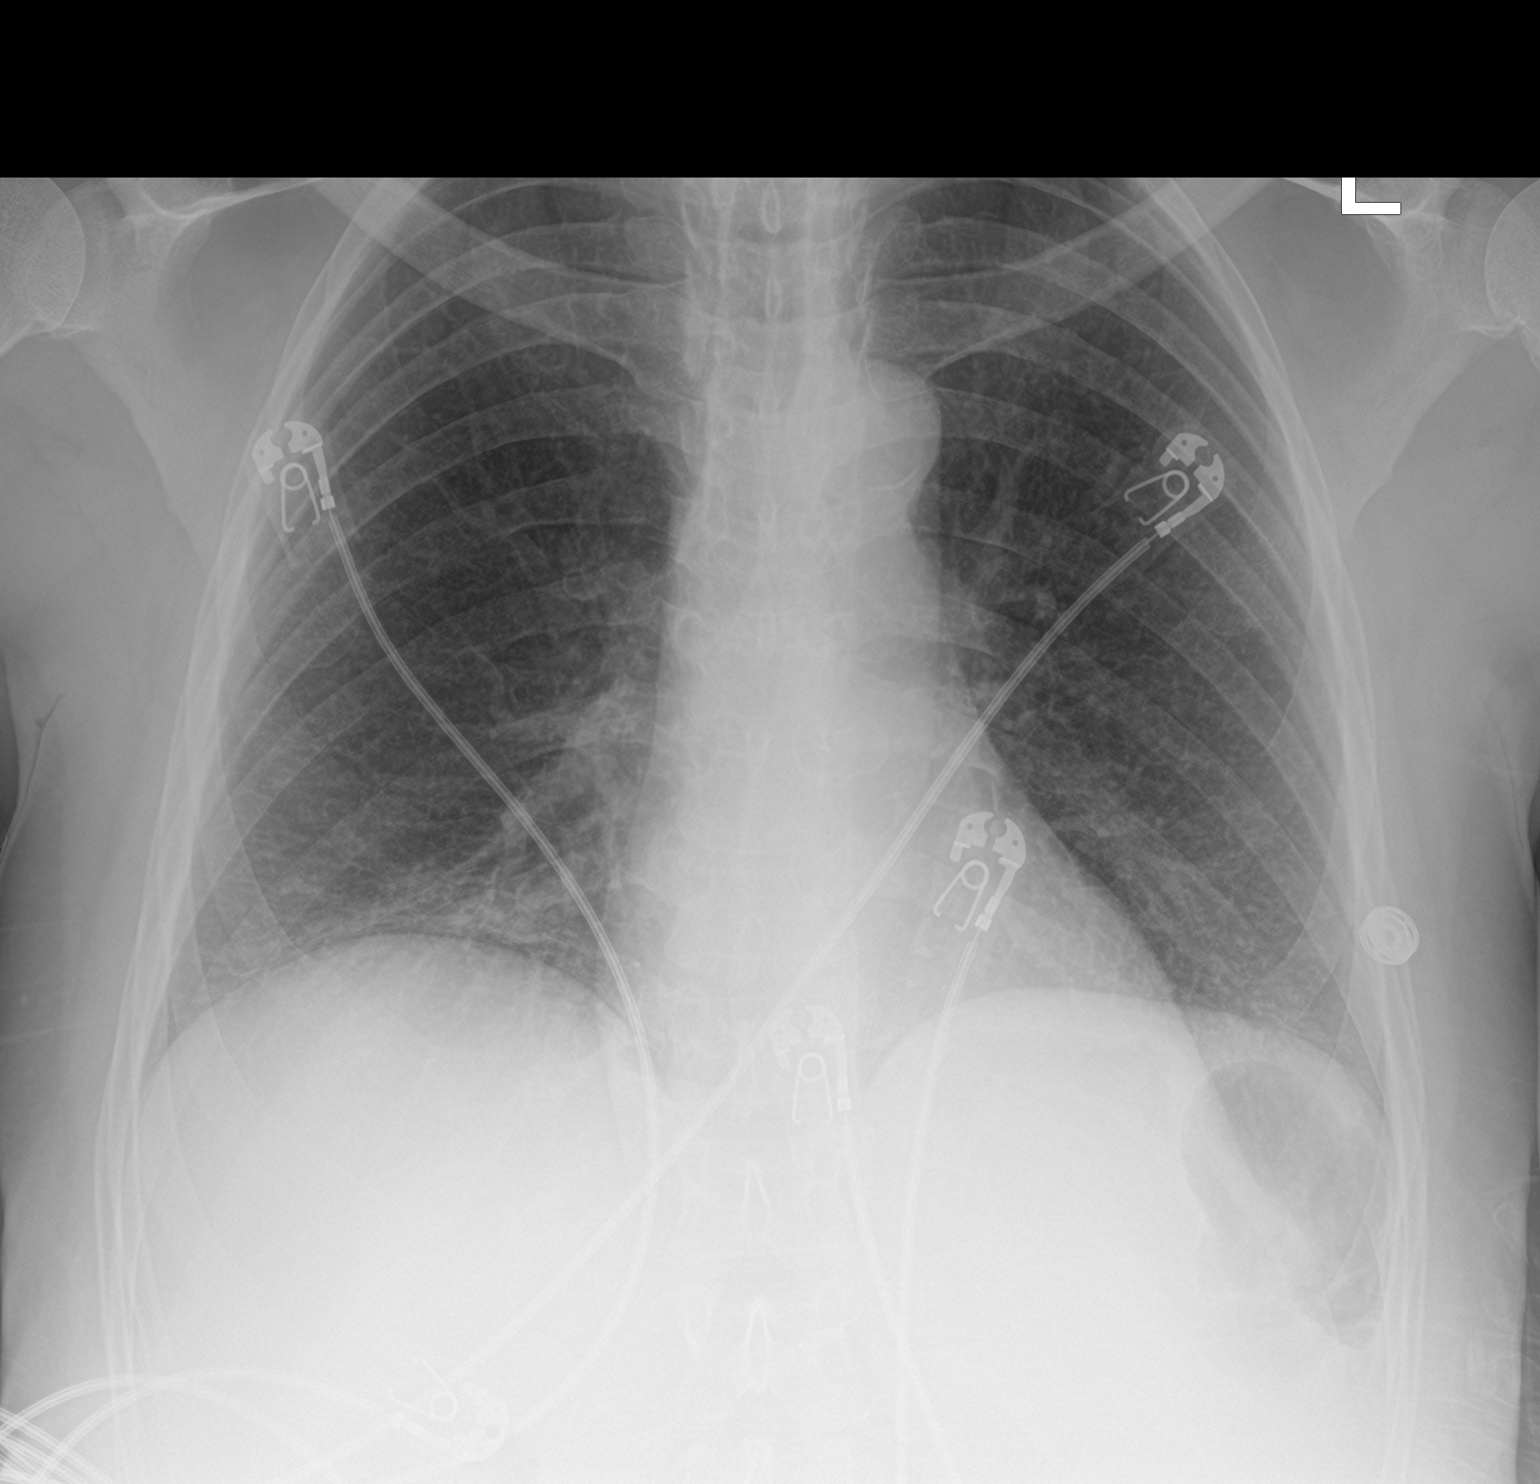

[2 of 2 positions shown; findings below may reference images not displayed]

FINDINGS: Right basilar atelectasis or scarring. Left lung clear. Heart is
normal size. No effusions or acute bony abnormality.
IMPRESSION: Right base atelectasis or scarring.  No active disease.

## 2022-02-04 IMAGING — MR MR HEAD W/O CM
10 of 11 series · 42 of 48 positions shown · non-contrast
Comparison: None Available.

CLINICAL DATA: Left arm weakness

EXAM:
MRI HEAD WITHOUT CONTRAST
TECHNIQUE: Multiplanar, multiecho pulse sequences of the brain and surrounding
structures were obtained without intravenous contrast.

[Series 9: DWI · axial · 3.0mm · 0.88mm/px · z∈[-88,+45]mm · 9 of 95 slices shown (1 of 4)]
[im 1/95]
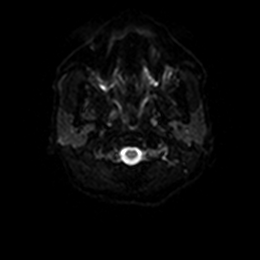
[im 12/95]
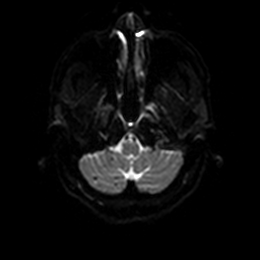
[im 24/95]
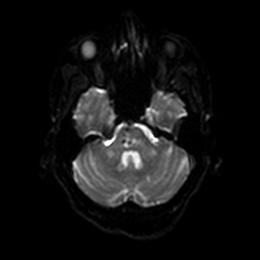
[im 36/95]
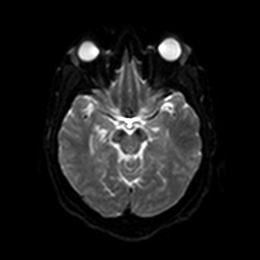
[im 48/95]
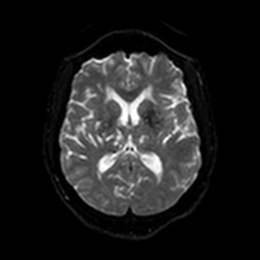
[im 59/95]
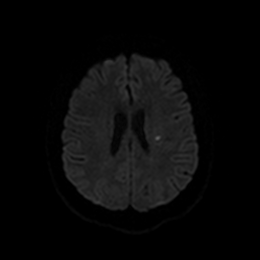
[im 71/95]
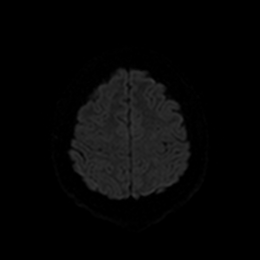
[im 83/95]
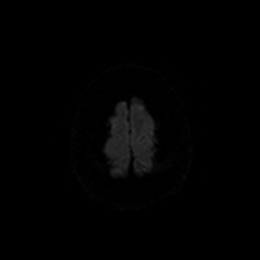
[im 95/95]
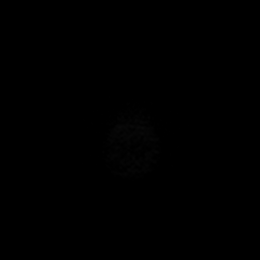

[Series 10: DWI · axial · 3.0mm · 0.88mm/px · z∈[-88,+45]mm · 4 of 48 slices shown (2 of 4)]
[im 1/48]
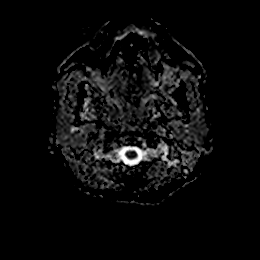
[im 16/48]
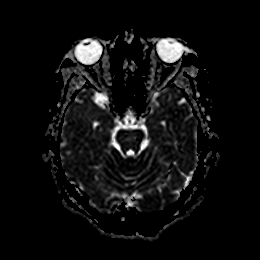
[im 32/48]
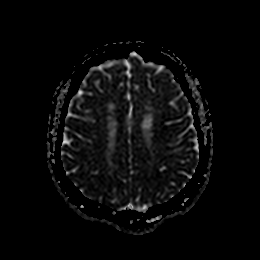
[im 48/48]
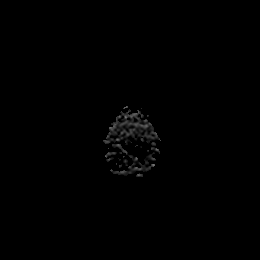

[Series 11: DWI · coronal · 4.0mm · 0.88mm/px · 6 of 64 slices shown (3 of 4)]
[im 1/64]
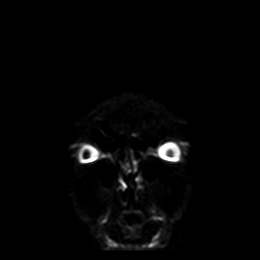
[im 13/64]
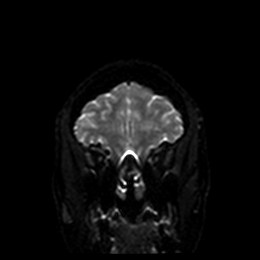
[im 26/64]
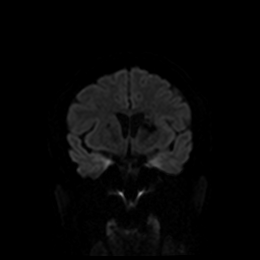
[im 38/64]
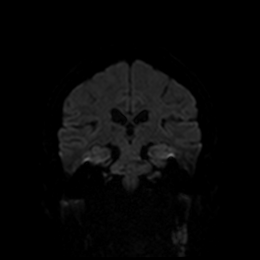
[im 51/64]
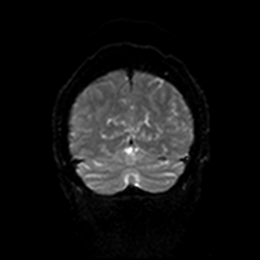
[im 64/64]
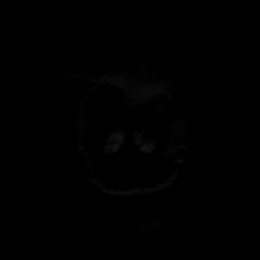

[Series 12: DWI · coronal · 4.0mm · 0.88mm/px · 3 of 32 slices shown (4 of 4)]
[im 1/32]
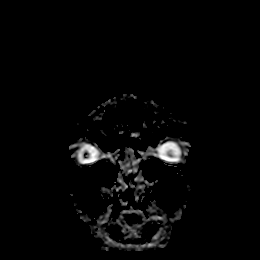
[im 16/32]
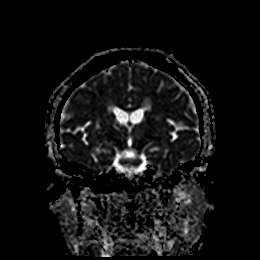
[im 32/32]
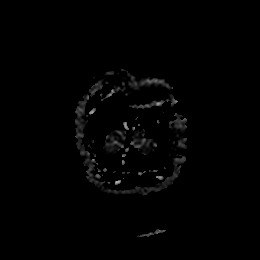

[Series 13: T1 · sagittal · 5.0mm · 0.75mm/px · 2 of 23 slices shown]
[im 1/23]
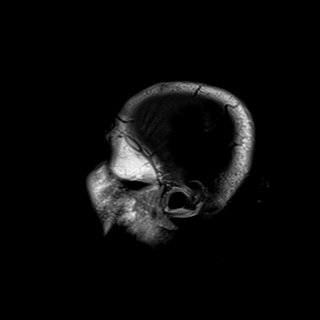
[im 23/23]
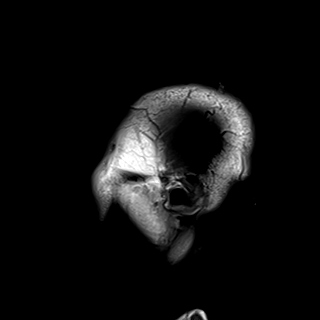

[Series 14: T2 · axial · 5.0mm · 0.72mm/px · z∈[-89,+46]mm · 2 of 25 slices shown (1 of 2)]
[im 1/25]
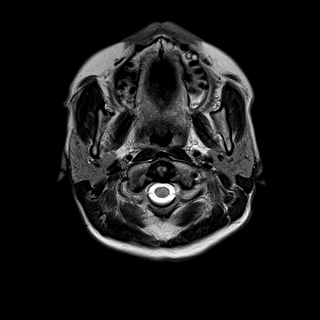
[im 25/25]
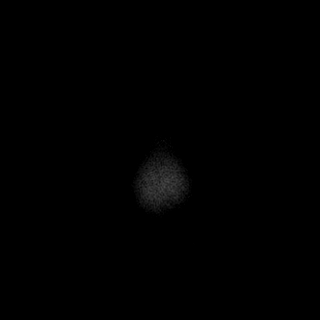

[Series 15: FLAIR · axial · 5.0mm · 0.45mm/px · z∈[-89,+46]mm · 2 of 25 slices shown]
[im 1/25]
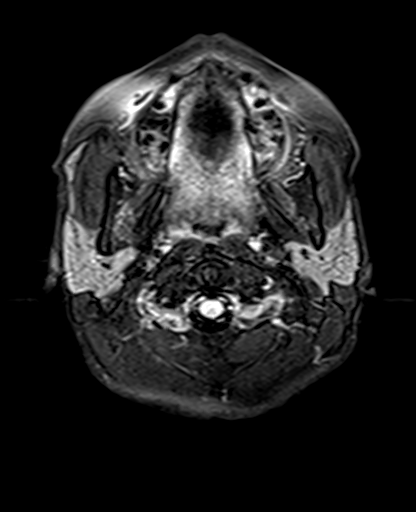
[im 25/25]
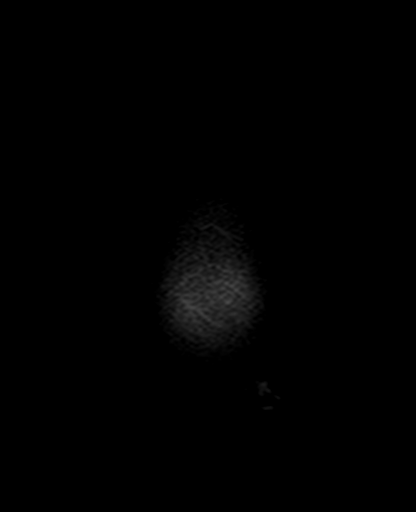

[Series 17: pha_images · axial · 3.0mm · 0.90mm/px · z∈[-104,+48]mm · 5 of 54 slices shown]
[im 1/54]
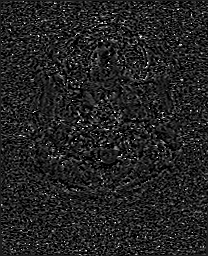
[im 14/54]
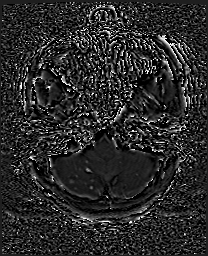
[im 27/54]
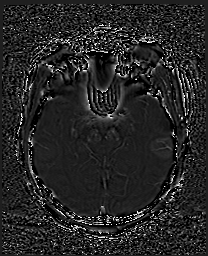
[im 40/54]
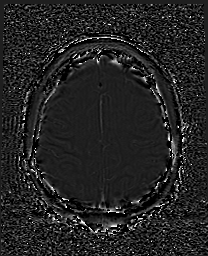
[im 54/54]
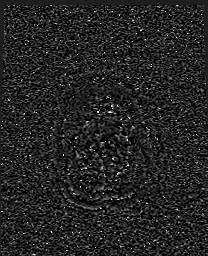

[Series 18: swi_images · axial · 3.0mm · 0.90mm/px · z∈[-104,+62]mm · 6 of 60 slices shown]
[im 1/60]
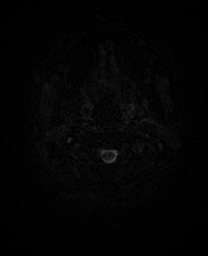
[im 12/60]
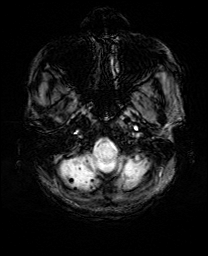
[im 24/60]
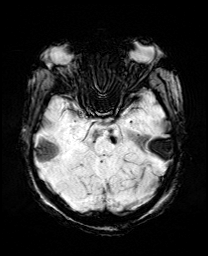
[im 36/60]
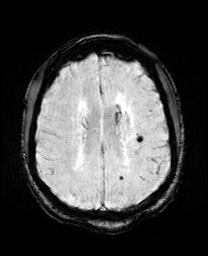
[im 48/60]
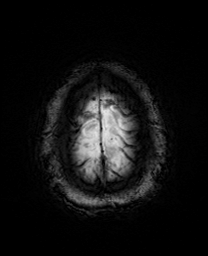
[im 60/60]
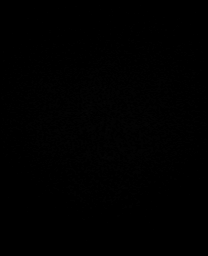

[Series 21: T2 · coronal · 5.0mm · 0.34mm/px · 3 of 29 slices shown (2 of 2)]
[im 1/29]
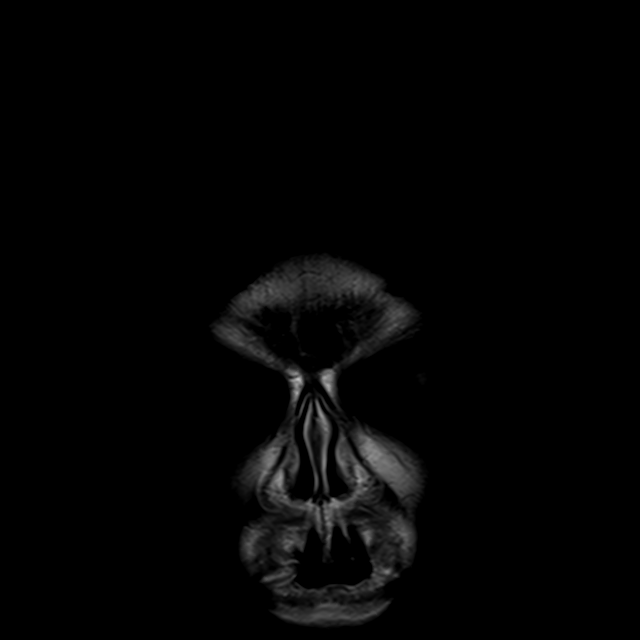
[im 15/29]
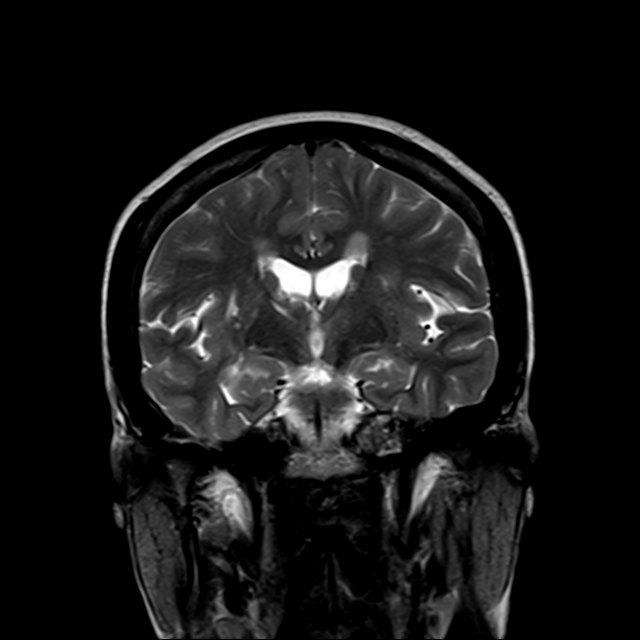
[im 29/29]
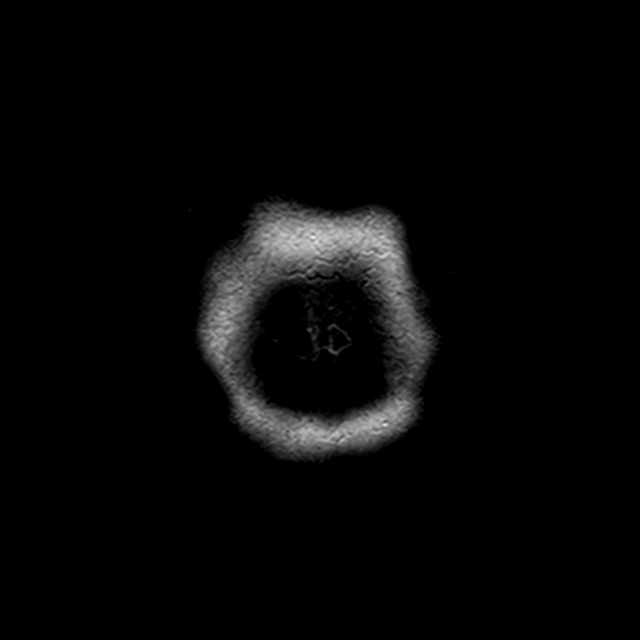

[42 of 48 positions shown; findings below may reference images not displayed]

FINDINGS: Brain: There are 2 small areas of acute ischemia within the left
basal ganglia and centrum semiovale. Multiple chronic
microhemorrhages in a predominantly central distribution. There is
multifocal hyperintense T2-weighted signal within the white matter.
Generalized volume loss. Multiple old small vessel infarcts of the
deep gray nuclei, brainstem and cerebellum.

Vascular: Major flow voids are preserved.

Skull and upper cervical spine: Normal calvarium and skull base.
Visualized upper cervical spine and soft tissues are normal.

Sinuses/Orbits:No paranasal sinus fluid levels or advanced mucosal
thickening. No mastoid or middle ear effusion. Normal orbits.
IMPRESSION: 1. Two small areas of acute ischemia within the left basal ganglia
and centrum semiovale. No acute hemorrhage or mass effect.
2. Multiple old small vessel infarcts and findings of chronic
ischemic microangiopathy.
3. Numerous chronic microhemorrhages in the central distribution
consistent with chronic hypertensive angiopathy.

## 2022-02-04 MED ORDER — LORAZEPAM 2 MG/ML IJ SOLN
1.0000 mg | Freq: Once | INTRAMUSCULAR | Status: AC | PRN
  Administered 2022-02-04: 1 mg via INTRAVENOUS
  Filled 2022-02-04: qty 1

## 2022-02-04 NOTE — ED Notes (Signed)
Patient transported to MRI 

## 2022-02-04 NOTE — ED Provider Notes (Signed)
[facility] EMERGENCY DEPARTMENT Provider Note   CSN: [id number] Arrival date & time: 02/04/22  1349     History  No chief complaint on file.   Roberta Calderon is a 51 y.o. female.  Roberta Calderon is a 51 y.o. female with prior history of hypertension and stroke, who presents to the ED for evaluation of left arm and left lower extremity weakness.  Last known normal was 9 PM yesterday before going to bed. Patient reports history of some chronic lower extremity weakness from prior stroke that she is doing physical therapy for.  Patient also reports that she has felt some numbness and decreased sensation in the left arm.  Reports mild headache.  Denies vision or speech changes.  No dizziness or syncope.  No seizures.  Patient denies any chest pain or shortness of breath.  Does report that she was recently diagnosed with diabetes.  No other aggravating or alleviating factors.  The history is provided by the patient and medical records.       Home Medications Prior to Admission medications   Medication Sig Start Date End Date Taking? Authorizing Provider  amLODipine (NORVASC) 10 MG tablet Take 1 tablet (10 mg total) by mouth daily. 01/10/22 01/10/23  Kerin Perna, NP  atorvastatin (LIPITOR) 80 MG tablet Take 1 tablet (80 mg total) by mouth daily. 09/12/21 09/12/22  Kerin Perna, NP  clopidogrel (PLAVIX) 75 MG tablet Take 1 tablet (75 mg total) by mouth daily. 08/17/21 08/17/22  Caren Griffins, MD  hydrochlorothiazide (HYDRODIURIL) 25 MG tablet Take 1 tablet (25 mg total) by mouth daily. 02/01/22   Kerin Perna, NP  losartan (COZAAR) 50 MG tablet Take 1 tablet (50 mg total) by mouth daily. 12/11/21   Kerin Perna, NP  metFORMIN (GLUCOPHAGE-XR) 500 MG 24 hr tablet Take 1 tablet (500 mg total) by mouth daily with breakfast. 02/01/22   Kerin Perna, NP  Multiple Vitamins-Minerals (CERTAVITE/ANTIOXIDANTS) TABS Take 1 tablet by mouth daily.  08/18/21   Caren Griffins, MD      Allergies    Nsaids and Aspirin    Review of Systems   Review of Systems  Constitutional:  Negative for chills and fever.  Eyes:  Negative for visual disturbance.  Respiratory:  Negative for shortness of breath.   Cardiovascular:  Negative for chest pain.  Neurological:  Positive for weakness, numbness and headaches. Negative for dizziness, seizures, syncope, facial asymmetry and speech difficulty.    Physical Exam Updated Vital Signs BP (!) 163/119   Pulse 81   Temp (!) 97.5 F (36.4 C) (Oral)   Resp 14   SpO2 94%  Physical Exam Vitals and nursing note reviewed.  Constitutional:      General: She is not in acute distress.    Appearance: Normal appearance. She is well-developed. She is not ill-appearing or diaphoretic.  HENT:     Head: Normocephalic and atraumatic.     Mouth/Throat:     Mouth: Mucous membranes are moist.     Pharynx: Oropharynx is clear.  Eyes:     General:        Right eye: No discharge.        Left eye: No discharge.     Pupils: Pupils are equal, round, and reactive to light.  Cardiovascular:     Rate and Rhythm: Normal rate and regular rhythm.     Pulses: Normal pulses.     Heart sounds: Normal heart sounds.  Pulmonary:     Effort: Pulmonary effort is normal. No respiratory distress.     Breath sounds: Normal breath sounds. No wheezing or rales.     Comments: Respirations equal and unlabored, patient able to speak in full sentences, lungs clear to auscultation bilaterally  Abdominal:     General: Bowel sounds are normal. There is no distension.     Palpations: Abdomen is soft. There is no mass.     Tenderness: There is no abdominal tenderness. There is no guarding.     Comments: Abdomen soft, nondistended, nontender to palpation in all quadrants without guarding or peritoneal signs  Musculoskeletal:        General: No swelling, tenderness or deformity.     Cervical back: Neck supple.  Skin:    General:  Skin is warm and dry.     Capillary Refill: Capillary refill takes less than 2 seconds.  Neurological:     Mental Status: She is alert and oriented to person, place, and time.     Coordination: Coordination normal.     Comments: Speech is clear, able to follow commands CN III-XII intact 3/5 strength noted in the left lower extremity and 4/5 strength noted in the left upper extremity, 5/5 strength on the right Patient reports sensation intact in the left upper extremity but decreased compared to others, normal sensation to light touch noted in the right upper extremity and bilateral lower extremities Moves extremities without ataxia, coordination intact  Psychiatric:        Mood and Affect: Mood normal.        Behavior: Behavior normal.     ED Results / Procedures / Treatments   Labs (all labs ordered are listed, but only abnormal results are displayed) Labs Reviewed  CBC WITH DIFFERENTIAL/PLATELET - Abnormal; Notable for the following components:      Result Value   RBC 5.71 (*)    MCV 79.2 (*)    MCH 24.5 (*)    All other components within normal limits  RESP PANEL BY RT-PCR (FLU A&B, COVID) ARPGX2  ETHANOL  PROTIME-INR  APTT  COMPREHENSIVE METABOLIC PANEL  CBC  DIFFERENTIAL  RAPID URINE DRUG SCREEN, HOSP PERFORMED  URINALYSIS, ROUTINE W REFLEX MICROSCOPIC    EKG None  Radiology  MR BRAIN WO CONTRAST  Result Date: 02/04/2022 CLINICAL DATA:  Left arm weakness EXAM: MRI HEAD WITHOUT CONTRAST TECHNIQUE: Multiplanar, multiecho pulse sequences of the brain and surrounding structures were obtained without intravenous contrast. COMPARISON:  None Available. FINDINGS: Brain: There are 2 small areas of acute ischemia within the left basal ganglia and centrum semiovale. Multiple chronic microhemorrhages in a predominantly central distribution. There is multifocal hyperintense T2-weighted signal within the white matter. Generalized volume loss. Multiple old small vessel infarcts of  the deep gray nuclei, brainstem and cerebellum. Vascular: Major flow voids are preserved. Skull and upper cervical spine: Normal calvarium and skull base. Visualized upper cervical spine and soft tissues are normal. Sinuses/Orbits:No paranasal sinus fluid levels or advanced mucosal thickening. No mastoid or middle ear effusion. Normal orbits. IMPRESSION: 1. Two small areas of acute ischemia within the left basal ganglia and centrum semiovale. No acute hemorrhage or mass effect. 2. Multiple old small vessel infarcts and findings of chronic ischemic microangiopathy. 3. Numerous chronic microhemorrhages in the central distribution consistent with chronic hypertensive angiopathy. Electronically Signed   By: Ulyses Jarred M.D.   On: 02/04/2022 23:42   DG Chest 2 View  Result Date: 02/04/2022 CLINICAL DATA:  Chest pain  EXAM: CHEST - 2 VIEW COMPARISON:  02/07/2021 FINDINGS: Right basilar atelectasis or scarring. Left lung clear. Heart is normal size. No effusions or acute bony abnormality. IMPRESSION: Right base atelectasis or scarring.  No active disease. Electronically Signed   By: Rolm Baptise M.D.   On: 02/04/2022 19:18   CT HEAD WO CONTRAST  Result Date: 02/04/2022 CLINICAL DATA:  TIA EXAM: CT HEAD WITHOUT CONTRAST TECHNIQUE: Contiguous axial images were obtained from the base of the skull through the vertex without intravenous contrast. RADIATION DOSE REDUCTION: This exam was performed according to the departmental dose-optimization program which includes automated exposure control, adjustment of the mA and/or kV according to patient size and/or use of iterative reconstruction technique. COMPARISON:  08/17/2021 FINDINGS: Brain: No acute intracranial findings are seen. There is interval resolution of small focus bleeding in the left frontal lobe adjacent to the posterior aspect of left sylvian fissure since 08/17/2021. In the current study, there are no signs of bleeding within the cranium. There is decreased  density in the periventricular and subcortical white matter suggesting small-vessel disease. Small old lacunar infarcts are seen in the basal ganglia with no significant change. Vascular: Unremarkable. Skull: Unremarkable. Sinuses/Orbits: Unremarkable. Other: None. IMPRESSION: No acute intracranial findings are seen in the noncontrast CT brain. Atrophy. Small-vessel disease. Small old lacunar infarcts in the basal ganglia appear stable. Electronically Signed   By: Elmer Picker M.D.   On: 02/04/2022 14:30     Procedures Procedures    Medications Ordered in ED Medications - No data to display  ED Course/ Medical Decision Making/ A&P                           Medical Decision Making Amount and/or Complexity of Data Reviewed Radiology: ordered.  Risk Prescription drug management. Decision regarding hospitalization.   51 y.o. female presents to the ED with complaints of left upper extremity weakness and numbness, this involves an extensive number of treatment options, and is a complaint that carries with it a high risk of complications and morbidity.  The differential diagnosis includes stroke, recrudescence of deficits from previous stroke, peripheral neuropathy, hypoglycemia, electrolyte derangement, infection (Ddx)  On arrival pt is nontoxic, vitals significant for hypertension with systolic in the 810F, otherwise unremarkable. Exam significant for weakness in the left upper and lower extremity  Additional history obtained from chart review. Previous records obtained and reviewed prior evaluations for stroke including prior MRI  Lab Tests:  I Ordered, reviewed, and interpreted labs, which included: No leukocytosis, stable hemoglobin, no significant electrolyte derangements, normal renal and liver function, negative tox screen  Imaging Studies ordered:  I ordered imaging studies which included CT head, chest x-ray, MRI brain, I independently visualized and interpreted imaging  which showed CT head with multiple prior strokes noted, no acute findings, chest x-ray with no active cardiopulmonary disease.  MRI with 2 small areas of acute ischemia in the left basal ganglia  ED Course:   Imaging results with new acute stroke despite current medication management.  Discussed results with patient and will consult neurology  I consulted neurology and discussed lab and imaging findings, Dr. Erlinda Hong recommends medicine admission for further stroke evaluation.  He will see patient in consult.  I requested consultation with the hospitalist for admission and discussed pertinent labs, imaging and plan with Dr. Hal Hope who will see and admit the patient.    Portions of this note were generated with Lobby[employer]. Dictation errors  may occur despite best attempts at proofreading.         Final Clinical Impression(s) / ED Diagnoses Final diagnoses:  Infarction of left basal ganglia [facility])    Rx / DC Orders ED Discharge Orders     None         Janet Berlin 02/26/22 2118    Valarie Merino, MD 02/27/22 1159

## 2022-02-04 NOTE — ED Notes (Signed)
Pt in lobby eating a sandwich bought from downstairs. Moving both extremities fine.

## 2022-02-04 NOTE — ED Notes (Signed)
Called pt x2 for vitals, no response. °

## 2022-02-04 NOTE — ED Notes (Signed)
This RN went into pt's room to update pt about being able to eat or not. At this time pt was using profanity towards staff and expressing anger about pt's situation. This RN attempted to deescalate, which at the time was unsuccessful. EDP notified.

## 2022-02-04 NOTE — ED Notes (Signed)
Patient came triage yelling and cussing that she is hurting and "we are going to wait until some fucking dies out here".  Explained about labs and orders have been done and waiting on a room.  VSS patient continues to cuss and state " you need to watch your fucking mouth"\. Advised for patient to sitting in lobby until she is called.

## 2022-02-04 NOTE — ED Triage Notes (Signed)
Patient arrived by [facility] with complaint of left arm pain since awakening this am. Patient reports that she went to bed last known at 930pm. Speech clear and denies pain. NAD

## 2022-02-04 NOTE — ED Provider Triage Note (Signed)
Emergency Medicine Provider Triage Evaluation Note  Roberta Calderon , a 51 y.o. female  was evaluated in triage.  Pt complains of left arm weakness last normal last night prior to going to bed around 9 PM.  Recently diagnosed with diabetes on Thursday.  Does have a prior history of CVA, multiple medications per patient.  Reports she did not have any prior deficit from previous CVAs..  Review of Systems  Positive: Left arm weakness Negative: Headache, vision changes  Physical Exam  There were no vitals taken for this visit. Gen:   Awake, no distress   Resp:  Normal effort  MSK:   Moves extremities without difficulty  Other:  Decreased strength in left upper extremity with flexion and extension.  Medical Decision Making  Medically screening exam initiated at 1:53 PM.  Appropriate orders placed.  Roberta Calderon was informed that the remainder of the evaluation will be completed by another provider, this initial triage assessment does not replace that evaluation, and the importance of remaining in the ED until their evaluation is complete.     Janeece Fitting, PA-C 02/04/22 1356

## 2022-02-04 NOTE — ED Notes (Signed)
Pt to radiology via stretcher.  

## 2022-02-05 ENCOUNTER — Observation Stay (HOSPITAL_BASED_OUTPATIENT_CLINIC_OR_DEPARTMENT_OTHER): Payer: Medicaid Other

## 2022-02-05 ENCOUNTER — Encounter (HOSPITAL_COMMUNITY): Payer: Self-pay | Admitting: Internal Medicine

## 2022-02-05 ENCOUNTER — Observation Stay (HOSPITAL_COMMUNITY): Payer: Medicaid Other

## 2022-02-05 DIAGNOSIS — I639 Cerebral infarction, unspecified: Secondary | ICD-10-CM

## 2022-02-05 DIAGNOSIS — I6389 Other cerebral infarction: Secondary | ICD-10-CM

## 2022-02-05 DIAGNOSIS — I10 Essential (primary) hypertension: Secondary | ICD-10-CM

## 2022-02-05 LAB — CBC
HCT: 43.8 % (ref 36.0–46.0)
Hemoglobin: 13.6 g/dL (ref 12.0–15.0)
MCH: 24.4 pg — ABNORMAL LOW (ref 26.0–34.0)
MCHC: 31.1 g/dL (ref 30.0–36.0)
MCV: 78.5 fL — ABNORMAL LOW (ref 80.0–100.0)
Platelets: 315 10*3/uL (ref 150–400)
RBC: 5.58 MIL/uL — ABNORMAL HIGH (ref 3.87–5.11)
RDW: 13 % (ref 11.5–15.5)
WBC: 5.8 10*3/uL (ref 4.0–10.5)
nRBC: 0 % (ref 0.0–0.2)

## 2022-02-05 LAB — LIPID PANEL
Cholesterol: 227 mg/dL — ABNORMAL HIGH (ref 0–200)
HDL: 58 mg/dL (ref >40)
LDL Cholesterol: 155 mg/dL — ABNORMAL HIGH (ref 0–99)
Total CHOL/HDL Ratio: 3.9 RATIO
Triglycerides: 71 mg/dL (ref <150)
VLDL: 14 mg/dL (ref 0–40)

## 2022-02-05 LAB — COMPREHENSIVE METABOLIC PANEL
ALT: 19 U/L (ref 0–44)
AST: 19 U/L (ref 15–41)
Albumin: 3.7 g/dL (ref 3.5–5.0)
Alkaline Phosphatase: 87 U/L (ref 38–126)
Anion gap: 8 (ref 5–15)
BUN: 14 mg/dL (ref 6–20)
CO2: 26 mmol/L (ref 22–32)
Calcium: 9.8 mg/dL (ref 8.9–10.3)
Chloride: 103 mmol/L (ref 98–111)
Creatinine, Ser: 0.87 mg/dL (ref 0.44–1.00)
GFR, Estimated: >60 mL/min (ref >60)
Glucose, Bld: 100 mg/dL — ABNORMAL HIGH (ref 70–99)
Potassium: 3.5 mmol/L (ref 3.5–5.1)
Sodium: 137 mmol/L (ref 135–145)
Total Bilirubin: 0.6 mg/dL (ref 0.3–1.2)
Total Protein: 7.3 g/dL (ref 6.5–8.1)

## 2022-02-05 LAB — GLUCOSE, CAPILLARY
Glucose-Capillary: 121 mg/dL — ABNORMAL HIGH (ref 70–99)
Glucose-Capillary: 134 mg/dL — ABNORMAL HIGH (ref 70–99)

## 2022-02-05 LAB — RAPID URINE DRUG SCREEN, HOSP PERFORMED
Amphetamines: NOT DETECTED
Barbiturates: NOT DETECTED
Benzodiazepines: NOT DETECTED
Cocaine: NOT DETECTED
Opiates: NOT DETECTED
Tetrahydrocannabinol: NOT DETECTED

## 2022-02-05 LAB — CBG MONITORING, ED
Glucose-Capillary: 134 mg/dL — ABNORMAL HIGH (ref 70–99)
Glucose-Capillary: 87 mg/dL (ref 70–99)

## 2022-02-05 LAB — ECHOCARDIOGRAM COMPLETE
AR max vel: 1.9 cm2
AV Area VTI: 1.94 cm2
AV Area mean vel: 1.93 cm2
AV Mean grad: 5 mmHg
AV Peak grad: 7.6 mmHg
Ao pk vel: 1.38 m/s
Area-P 1/2: 5.27 cm2
Height: 62 in
S' Lateral: 2 cm
Weight: 2160 oz

## 2022-02-05 LAB — HIV ANTIBODY (ROUTINE TESTING W REFLEX): HIV Screen 4th Generation wRfx: NONREACTIVE

## 2022-02-05 LAB — TROPONIN I (HIGH SENSITIVITY): Troponin I (High Sensitivity): 6 ng/L (ref <18)

## 2022-02-05 IMAGING — CT CT ANGIO HEAD-NECK (W OR W/O PERF)
1 of 11 series · 5 of 35 positions shown · non-contrast
Comparison: None Available.

CLINICAL DATA: Stroke

EXAM:
CT ANGIOGRAPHY HEAD AND NECK
TECHNIQUE: Multidetector CT imaging of the head and neck was performed using
the standard protocol during bolus administration of intravenous
contrast. Multiplanar CT image reconstructions and MIPs were
obtained to evaluate the vascular anatomy. Carotid stenosis
measurements (when applicable) are obtained utilizing NASCET
criteria, using the distal internal carotid diameter as the
denominator.

[Series 11: cta neck axial · axial · 0.39mm/px · z∈[-285,-68]mm · 5 of 328 slices shown]
[im 55/328  soft-tissue]
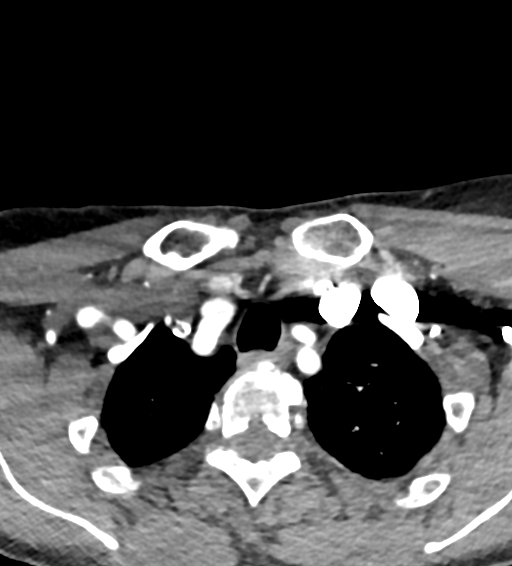
[im 110/328  bone]
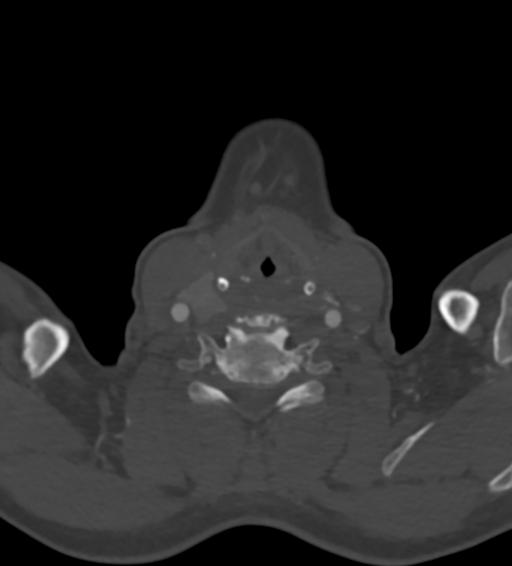
[im 164/328  soft-tissue]
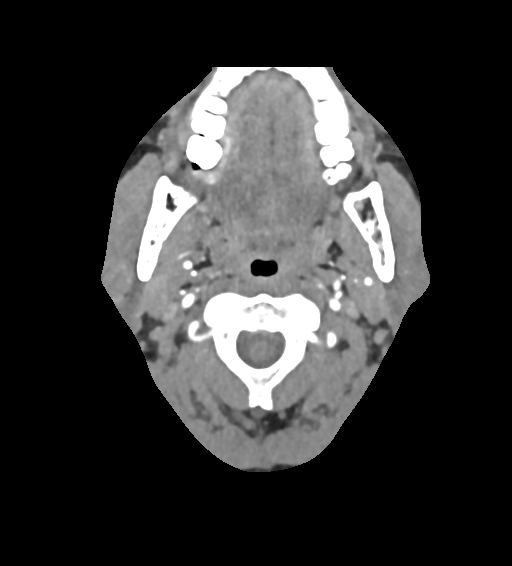
[im 219/328  bone]
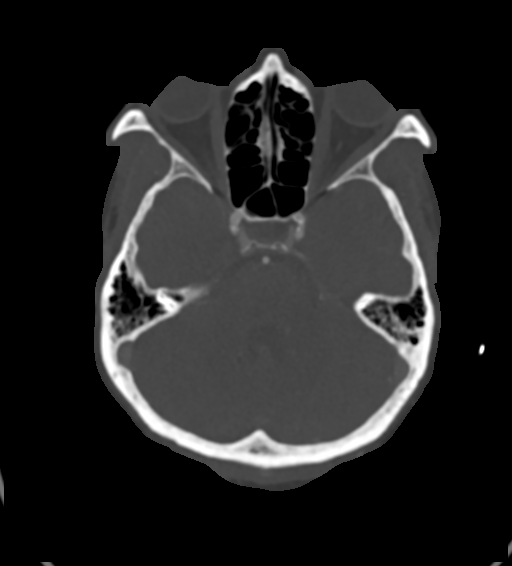
[im 273/328  soft-tissue]
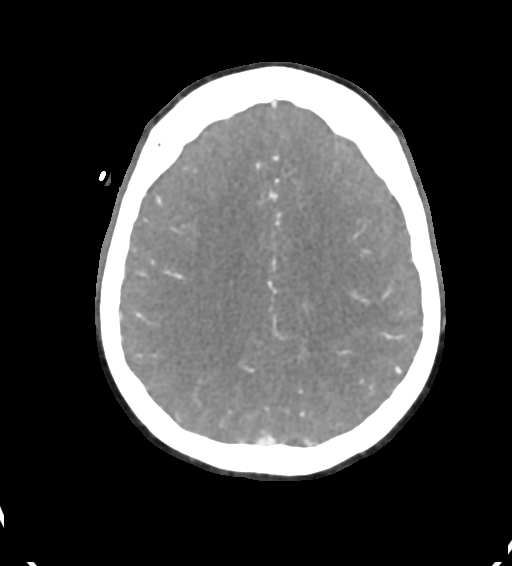

[5 of 35 positions shown; findings below may reference images not displayed]

RADIATION DOSE REDUCTION: This exam was performed according to the
departmental dose-optimization program which includes automated
exposure control, adjustment of the mA and/or kV according to
patient size and/or use of iterative reconstruction technique.

CONTRAST:  80mL OMNIPAQUE IOHEXOL 350 MG/ML SOLN
FINDINGS: CT HEAD FINDINGS

Brain: There is no mass, hemorrhage or extra-axial collection. The
size and configuration of the ventricles and extra-axial CSF spaces
are normal. There is no acute or chronic infarction. The brain
parenchyma is normal.

Skull: The visualized skull base, calvarium and extracranial soft
tissues are normal.

Sinuses/Orbits: No fluid levels or advanced mucosal thickening of
the visualized paranasal sinuses. No mastoid or middle ear effusion.
The orbits are normal.

CTA NECK FINDINGS

SKELETON: There is no bony spinal canal stenosis. No lytic or
blastic lesion.

OTHER NECK: Normal pharynx, larynx and major salivary glands. No
cervical lymphadenopathy. Unremarkable thyroid gland.

UPPER CHEST: No pneumothorax or pleural effusion. No nodules or
masses.

AORTIC ARCH:

There is calcific atherosclerosis of the aortic arch. There is no
aneurysm, dissection or hemodynamically significant stenosis of the
visualized portion of the aorta. Conventional 3 vessel aortic
branching pattern. The visualized proximal subclavian arteries are
widely patent.

RIGHT CAROTID SYSTEM: No dissection, occlusion or aneurysm. Mild
atherosclerotic calcification at the carotid bifurcation without
hemodynamically significant stenosis.

LEFT CAROTID SYSTEM: No dissection, occlusion or aneurysm. Mild
atherosclerotic calcification at the carotid bifurcation without
hemodynamically significant stenosis.

VERTEBRAL ARTERIES: Left dominant configuration. Both origins are
clearly patent. There is no dissection, occlusion or flow-limiting
stenosis to the skull base (V1-V3 segments).

CTA HEAD FINDINGS

POSTERIOR CIRCULATION:

--Vertebral arteries: Normal V4 segments.

--Inferior cerebellar arteries: Normal.

--Basilar artery: Normal.

--Superior cerebellar arteries: Normal.

--Posterior cerebral arteries (PCA): Normal.

ANTERIOR CIRCULATION:

--Intracranial internal carotid arteries: Atherosclerotic
calcification of the internal carotid arteries at the skull base
without hemodynamically significant stenosis.

--Anterior cerebral arteries (ACA): Normal. Both A1 segments are
present. Patent anterior communicating artery (a-comm).

--Middle cerebral arteries (MCA): Normal.

VENOUS SINUSES: As permitted by contrast timing, patent.

ANATOMIC VARIANTS: None

Review of the MIP images confirms the above findings.
IMPRESSION: 1. No emergent large vessel occlusion or hemodynamically significant
stenosis of the head or neck.
2. Mild bilateral carotid bifurcation atherosclerosis without
hemodynamically significant stenosis.
3. Aortic Atherosclerosis ([8K]-[8K]).

## 2022-02-05 MED ORDER — STROKE: EARLY STAGES OF RECOVERY BOOK
Freq: Once | Status: AC
  Administered 2022-02-05: 1
  Filled 2022-02-05: qty 1

## 2022-02-05 MED ORDER — HYDRALAZINE HCL 20 MG/ML IJ SOLN: 10.0000 mg | INTRAMUSCULAR | Status: DC | PRN

## 2022-02-05 MED ORDER — ACETAMINOPHEN 325 MG PO TABS
650.0000 mg | ORAL_TABLET | ORAL | Status: DC | PRN
  Administered 2022-02-05: 650 mg via ORAL
  Filled 2022-02-05: qty 2

## 2022-02-05 MED ORDER — SODIUM CHLORIDE 0.9 % IV SOLN: INTRAVENOUS | Status: DC

## 2022-02-05 MED ORDER — LOSARTAN POTASSIUM 50 MG PO TABS: 50.0000 mg | ORAL_TABLET | Freq: Every day | ORAL | Status: DC

## 2022-02-05 MED ORDER — AMLODIPINE BESYLATE 5 MG PO TABS: 10.0000 mg | ORAL_TABLET | Freq: Every day | ORAL | Status: DC

## 2022-02-05 MED ORDER — CLOPIDOGREL BISULFATE 75 MG PO TABS
75.0000 mg | ORAL_TABLET | Freq: Every day | ORAL | Status: DC
  Administered 2022-02-05 – 2022-02-06 (×2): 75 mg via ORAL
  Filled 2022-02-05 (×2): qty 1

## 2022-02-05 MED ORDER — ACETAMINOPHEN 160 MG/5ML PO SOLN: 650.0000 mg | ORAL | Status: DC | PRN

## 2022-02-05 MED ORDER — ACETAMINOPHEN 650 MG RE SUPP: 650.0000 mg | RECTAL | Status: DC | PRN

## 2022-02-05 MED ORDER — INSULIN ASPART 100 UNIT/ML IJ SOLN
0.0000 [IU] | Freq: Three times a day (TID) | INTRAMUSCULAR | Status: DC
  Administered 2022-02-06: 1 [IU] via SUBCUTANEOUS

## 2022-02-05 MED ORDER — ATORVASTATIN CALCIUM 80 MG PO TABS
80.0000 mg | ORAL_TABLET | Freq: Every day | ORAL | Status: DC
  Administered 2022-02-05 – 2022-02-06 (×2): 80 mg via ORAL
  Filled 2022-02-05: qty 2
  Filled 2022-02-05: qty 1

## 2022-02-05 MED ORDER — ENOXAPARIN SODIUM 40 MG/0.4ML IJ SOSY
40.0000 mg | PREFILLED_SYRINGE | INTRAMUSCULAR | Status: DC
  Administered 2022-02-05: 40 mg via SUBCUTANEOUS
  Filled 2022-02-05: qty 0.4

## 2022-02-05 MED ORDER — IOHEXOL 350 MG/ML SOLN
80.0000 mL | Freq: Once | INTRAVENOUS | Status: AC | PRN
  Administered 2022-02-05: 80 mL via INTRAVENOUS

## 2022-02-05 NOTE — ED Notes (Signed)
Pt requesting walker. Previous RN states that the pt has walked to the restroom 3x without walker and did fine. This Rn looked for walker and could not find one.

## 2022-02-05 NOTE — ED Notes (Signed)
Pt in room A&O X4. Ambulatory to the restroom with a a steady gait. Pt refused insulin and wanted to take home meds. Educated pt in the importance of glucose monitoring in the hospital. Pt not receptive. Updated on plan of care. Denies any needs at this time.

## 2022-02-05 NOTE — Consult Note (Signed)
Stroke Neurology Consultation Note  Consult Requested by: Benedetto Goad, PA-C  Reason for Consult: stroke  Consult Date: 02/05/22   The history was obtained from the pt and PA.  During history and examination, all items were able to obtain unless otherwise noted.  History of Present Illness:  Roberta Calderon is a 51 y.o. African American female with PMH of hypertension, smoker, alcohol abuse, substance abuse, stroke presented to ED for left hand and foot numbness weakness, imbalance for 2 days.  She stated that 2 days ago she woke up with left hand and foot numbness.  She also felt weakness on the left leg which made her difficulty walking, imbalance and almost fell.  She came to ER for evaluation.  CT no acute abnormality but MRI showed 2 small areas of acute stroke at left BG and CR.  Neurology consulted for stroke.  Admitted in 03/2020 for left-sided weakness, blurry vision for 1 week.  CT possible right internal capsule infarct with chronic bilateral BG infarcts.  CTA head and neck unremarkable.  MRI confirmed right inner capsule lacunar infarct.  EF 65 to 70%.  UDS positive for cocaine and benzo.  LDL 173, A1c 6.3, discharged on Plavix cannot tolerate aspirin due to itching) and statin.  Admitted in 01/2021 for difficulty walking, imbalance and left-sided weakness.  CT head concerning for left pontine small ICH versus mineralization.  CT head and neck no LVO.  Patient refused MRI.  LDL 142, A1c 6.3, UDS positive for cocaine.  Antiplatelet was on hold at discharge.  Admitted again in 07/2021 for lightheadedness, headache, chest pain and right-sided weakness. Utox positive for cocaine, benzodiazepines and opiates. MRI brain revealed a small right basal ganglia stroke.  LDL 133, A1c 6.6.  Discharged with Plavix and Lipitor 80.  LSN: 2 days ago tPA Given: No: Outside window  Past Medical History:  Diagnosis Date   Hypertension     History reviewed. No pertinent surgical history.  No  family history on file.  Social History:  reports that she has been smoking cigarettes. She has been smoking an average of .25 packs per day. She has never used smokeless tobacco. She reports current alcohol use. She reports that she does not use drugs.  Allergies:  Allergies  Allergen Reactions   Nsaids Nausea And Vomiting and Rash   Aspirin Hives    No current facility-administered medications on file prior to encounter.   Current Outpatient Medications on File Prior to Encounter  Medication Sig Dispense Refill   amLODipine (NORVASC) 10 MG tablet Take 1 tablet (10 mg total) by mouth daily. 90 tablet 1   atorvastatin (LIPITOR) 80 MG tablet Take 1 tablet (80 mg total) by mouth daily. 90 tablet 1   clopidogrel (PLAVIX) 75 MG tablet Take 1 tablet (75 mg total) by mouth daily. 30 tablet 0   hydrochlorothiazide (HYDRODIURIL) 25 MG tablet Take 1 tablet (25 mg total) by mouth daily. 90 tablet 3   losartan (COZAAR) 50 MG tablet Take 1 tablet (50 mg total) by mouth daily. 90 tablet 1   metFORMIN (GLUCOPHAGE-XR) 500 MG 24 hr tablet Take 1 tablet (500 mg total) by mouth daily with breakfast. 90 tablet 1   Multiple Vitamins-Minerals (CERTAVITE/ANTIOXIDANTS) TABS Take 1 tablet by mouth daily. 30 tablet 0    Review of Systems: A full ROS was attempted today and was able to be performed.  Systems assessed include - Constitutional, Eyes, HENT, Respiratory, Cardiovascular, Gastrointestinal, Genitourinary, Integument/breast, Hematologic/lymphatic, Musculoskeletal, Neurological, Behavioral/Psych, Endocrine, Allergic/Immunologic -  with pertinent responses as per HPI.  Physical Examination: Temp:  [97.5 F (36.4 C)] 97.5 F (36.4 C) (06/11 1522) Pulse Rate:  [70-99] 74 (06/12 0045) Resp:  [13-24] 16 (06/12 0045) BP: (134-163)/(80-138) 134/80 (06/12 0045) SpO2:  [94 %-100 %] 100 % (06/12 0045)  General - well nourished, well developed, in no apparent distress.    Ophthalmologic - fundi not visualized  due to noncooperation.    Cardiovascular - regular rhythm and rate  Mental Status -  Level of arousal and orientation to time, place, and person were intact. Language including expression, naming, repetition, comprehension was assessed and found intact. Fund of Knowledge was assessed and was intact.  Cranial Nerves II - XII - II - Vision intact OU. III, IV, VI - Extraocular movements intact. V - Facial sensation intact bilaterally. VII - Facial movement intact bilaterally. VIII - Hearing & vestibular intact bilaterally. X - Palate elevates symmetrically. XI - Chin turning & shoulder shrug intact bilaterally. XII - Tongue protrusion intact.  Motor Strength - The patient's strength was normal in right UE and LE, left UE no drift but left hand grip 4/5, left LE 3-/5 proximal and 4+/5 distal ankle DF and PF.    Motor Tone & Bulk - Muscle tone was assessed at the neck and appendages and was normal.  Bulk was normal and fasciculations were absent.   Reflexes - The patient's reflexes were normal in all extremities and she had no pathological reflexes.  Sensory - Light touch, temperature/pinprick were assessed and were symmetrical except decreased on the left hand.     Coordination - The patient had normal movements in the hands with no ataxia or dysmetria, although slow in action bilaterally.  Tremor was absent.  Gait and Station - deferred  Data Reviewed: MR BRAIN WO CONTRAST  Result Date: 02/04/2022 CLINICAL DATA:  Left arm weakness EXAM: MRI HEAD WITHOUT CONTRAST TECHNIQUE: Multiplanar, multiecho pulse sequences of the brain and surrounding structures were obtained without intravenous contrast. COMPARISON:  None Available. FINDINGS: Brain: There are 2 small areas of acute ischemia within the left basal ganglia and centrum semiovale. Multiple chronic microhemorrhages in a predominantly central distribution. There is multifocal hyperintense T2-weighted signal within the white matter.  Generalized volume loss. Multiple old small vessel infarcts of the deep gray nuclei, brainstem and cerebellum. Vascular: Major flow voids are preserved. Skull and upper cervical spine: Normal calvarium and skull base. Visualized upper cervical spine and soft tissues are normal. Sinuses/Orbits:No paranasal sinus fluid levels or advanced mucosal thickening. No mastoid or middle ear effusion. Normal orbits. IMPRESSION: 1. Two small areas of acute ischemia within the left basal ganglia and centrum semiovale. No acute hemorrhage or mass effect. 2. Multiple old small vessel infarcts and findings of chronic ischemic microangiopathy. 3. Numerous chronic microhemorrhages in the central distribution consistent with chronic hypertensive angiopathy. Electronically Signed   By: Ulyses Jarred M.D.   On: 02/04/2022 23:42   DG Chest 2 View  Result Date: 02/04/2022 CLINICAL DATA:  Chest pain EXAM: CHEST - 2 VIEW COMPARISON:  02/07/2021 FINDINGS: Right basilar atelectasis or scarring. Left lung clear. Heart is normal size. No effusions or acute bony abnormality. IMPRESSION: Right base atelectasis or scarring.  No active disease. Electronically Signed   By: Rolm Baptise M.D.   On: 02/04/2022 19:18   CT HEAD WO CONTRAST  Result Date: 02/04/2022 CLINICAL DATA:  TIA EXAM: CT HEAD WITHOUT CONTRAST TECHNIQUE: Contiguous axial images were obtained from the base of the skull through  the vertex without intravenous contrast. RADIATION DOSE REDUCTION: This exam was performed according to the departmental dose-optimization program which includes automated exposure control, adjustment of the mA and/or kV according to patient size and/or use of iterative reconstruction technique. COMPARISON:  08/17/2021 FINDINGS: Brain: No acute intracranial findings are seen. There is interval resolution of small focus bleeding in the left frontal lobe adjacent to the posterior aspect of left sylvian fissure since 08/17/2021. In the current study, there  are no signs of bleeding within the cranium. There is decreased density in the periventricular and subcortical white matter suggesting small-vessel disease. Small old lacunar infarcts are seen in the basal ganglia with no significant change. Vascular: Unremarkable. Skull: Unremarkable. Sinuses/Orbits: Unremarkable. Other: None. IMPRESSION: No acute intracranial findings are seen in the noncontrast CT brain. Atrophy. Small-vessel disease. Small old lacunar infarcts in the basal ganglia appear stable. Electronically Signed   By: Elmer Picker M.D.   On: 02/04/2022 14:30    Assessment: 51 y.o. female with PMH of hypertension, smoker, alcohol abuse, substance abuse, stroke presented to ED for left hand and foot numbness weakness, imbalance for 2 days. CT no acute abnormality but MRI showed 2 small areas of acute stroke at left BG and CR.  Neurology consulted for stroke.  However, pt left side stroke on MRI seems not able to explain her left sided numbness, weakness. Her symptoms may be due to DWI negative infarct on the right or recrudescence from prior strokes. Will continue stroke work up and PT/OT/speech. Continue plavix and statin.   Stroke Risk Factors - hyperlipidemia, hypertension, smoking, and cocaine use  Plan: Continue further stroke work up  Frequent neuro checks Telemetry monitoring CTA head and neck Echocardiogram  UDS, fasting lipid panel and HgbA1C PT/OT/speech consult Continue home Plavix and statin  Discussed with ED PA We will follow   Thank you for this consultation and allowing [place] to participate in the care of this patient.   Rosalin Hawking, MD PhD Stroke Neurology 02/05/2022 2:09 AM

## 2022-02-05 NOTE — Progress Notes (Addendum)
STROKE TEAM PROGRESS NOTE   INTERVAL HISTORY Patient is sleeping on the stretcher, awakens easily. No family at bedside.   Vitals:   02/05/22 0445 02/05/22 0519 02/05/22 0630 02/05/22 0741  BP: (!) 155/87 (!) 144/98 127/79 (!) 133/103  Pulse:  96  89  Resp:  17  16  Temp:    98.1 F (36.7 C)  TempSrc:    Oral  SpO2:  100%  98%  Weight:    61.2 kg  Height:    '5\' 2"'$  (1.575 m)   IMAGING past 24 hours CT ANGIO HEAD NECK W WO CM  Result Date: 02/05/2022 CLINICAL DATA:  Stroke EXAM: CT ANGIOGRAPHY HEAD AND NECK TECHNIQUE: Multidetector CT imaging of the head and neck was performed using the standard protocol during bolus administration of intravenous contrast. Multiplanar CT image reconstructions and MIPs were obtained to evaluate the vascular anatomy. Carotid stenosis measurements (when applicable) are obtained utilizing NASCET criteria, using the distal internal carotid diameter as the denominator. RADIATION DOSE REDUCTION: This exam was performed according to the departmental dose-optimization program which includes automated exposure control, adjustment of the mA and/or kV according to patient size and/or use of iterative reconstruction technique. CONTRAST:  54m OMNIPAQUE IOHEXOL 350 MG/ML SOLN COMPARISON:  None Available. FINDINGS: CT HEAD FINDINGS Brain: There is no mass, hemorrhage or extra-axial collection. The size and configuration of the ventricles and extra-axial CSF spaces are normal. There is no acute or chronic infarction. The brain parenchyma is normal. Skull: The visualized skull base, calvarium and extracranial soft tissues are normal. Sinuses/Orbits: No fluid levels or advanced mucosal thickening of the visualized paranasal sinuses. No mastoid or middle ear effusion. The orbits are normal. CTA NECK FINDINGS SKELETON: There is no bony spinal canal stenosis. No lytic or blastic lesion. OTHER NECK: Normal pharynx, larynx and major salivary glands. No cervical lymphadenopathy.  Unremarkable thyroid gland. UPPER CHEST: No pneumothorax or pleural effusion. No nodules or masses. AORTIC ARCH: There is calcific atherosclerosis of the aortic arch. There is no aneurysm, dissection or hemodynamically significant stenosis of the visualized portion of the aorta. Conventional 3 vessel aortic branching pattern. The visualized proximal subclavian arteries are widely patent. RIGHT CAROTID SYSTEM: No dissection, occlusion or aneurysm. Mild atherosclerotic calcification at the carotid bifurcation without hemodynamically significant stenosis. LEFT CAROTID SYSTEM: No dissection, occlusion or aneurysm. Mild atherosclerotic calcification at the carotid bifurcation without hemodynamically significant stenosis. VERTEBRAL ARTERIES: Left dominant configuration. Both origins are clearly patent. There is no dissection, occlusion or flow-limiting stenosis to the skull base (V1-V3 segments). CTA HEAD FINDINGS POSTERIOR CIRCULATION: --Vertebral arteries: Normal V4 segments. --Inferior cerebellar arteries: Normal. --Basilar artery: Normal. --Superior cerebellar arteries: Normal. --Posterior cerebral arteries (PCA): Normal. ANTERIOR CIRCULATION: --Intracranial internal carotid arteries: Atherosclerotic calcification of the internal carotid arteries at the skull base without hemodynamically significant stenosis. --Anterior cerebral arteries (ACA): Normal. Both A1 segments are present. Patent anterior communicating artery (a-comm). --Middle cerebral arteries (MCA): Normal. VENOUS SINUSES: As permitted by contrast timing, patent. ANATOMIC VARIANTS: None Review of the MIP images confirms the above findings. IMPRESSION: 1. No emergent large vessel occlusion or hemodynamically significant stenosis of the head or neck. 2. Mild bilateral carotid bifurcation atherosclerosis without hemodynamically significant stenosis. 3. Aortic Atherosclerosis (ICD10-I70.0). Electronically Signed   By: KUlyses JarredM.D.   On: 02/05/2022 03:22    MR BRAIN WO CONTRAST  Result Date: 02/04/2022 CLINICAL DATA:  Left arm weakness EXAM: MRI HEAD WITHOUT CONTRAST TECHNIQUE: Multiplanar, multiecho pulse sequences of the brain and surrounding structures were  obtained without intravenous contrast. COMPARISON:  None Available. FINDINGS: Brain: There are 2 small areas of acute ischemia within the left basal ganglia and centrum semiovale. Multiple chronic microhemorrhages in a predominantly central distribution. There is multifocal hyperintense T2-weighted signal within the white matter. Generalized volume loss. Multiple old small vessel infarcts of the deep gray nuclei, brainstem and cerebellum. Vascular: Major flow voids are preserved. Skull and upper cervical spine: Normal calvarium and skull base. Visualized upper cervical spine and soft tissues are normal. Sinuses/Orbits:No paranasal sinus fluid levels or advanced mucosal thickening. No mastoid or middle ear effusion. Normal orbits. IMPRESSION: 1. Two small areas of acute ischemia within the left basal ganglia and centrum semiovale. No acute hemorrhage or mass effect. 2. Multiple old small vessel infarcts and findings of chronic ischemic microangiopathy. 3. Numerous chronic microhemorrhages in the central distribution consistent with chronic hypertensive angiopathy. Electronically Signed   By: Ulyses Jarred M.D.   On: 02/04/2022 23:42   DG Chest 2 View  Result Date: 02/04/2022 CLINICAL DATA:  Chest pain EXAM: CHEST - 2 VIEW COMPARISON:  02/07/2021 FINDINGS: Right basilar atelectasis or scarring. Left lung clear. Heart is normal size. No effusions or acute bony abnormality. IMPRESSION: Right base atelectasis or scarring.  No active disease. Electronically Signed   By: Rolm Baptise M.D.   On: 02/04/2022 19:18   CT HEAD WO CONTRAST  Result Date: 02/04/2022 CLINICAL DATA:  TIA EXAM: CT HEAD WITHOUT CONTRAST TECHNIQUE: Contiguous axial images were obtained from the base of the skull through the vertex  without intravenous contrast. RADIATION DOSE REDUCTION: This exam was performed according to the departmental dose-optimization program which includes automated exposure control, adjustment of the mA and/or kV according to patient size and/or use of iterative reconstruction technique. COMPARISON:  08/17/2021 FINDINGS: Brain: No acute intracranial findings are seen. There is interval resolution of small focus bleeding in the left frontal lobe adjacent to the posterior aspect of left sylvian fissure since 08/17/2021. In the current study, there are no signs of bleeding within the cranium. There is decreased density in the periventricular and subcortical white matter suggesting small-vessel disease. Small old lacunar infarcts are seen in the basal ganglia with no significant change. Vascular: Unremarkable. Skull: Unremarkable. Sinuses/Orbits: Unremarkable. Other: None. IMPRESSION: No acute intracranial findings are seen in the noncontrast CT brain. Atrophy. Small-vessel disease. Small old lacunar infarcts in the basal ganglia appear stable. Electronically Signed   By: Elmer Picker M.D.   On: 02/04/2022 14:30    PHYSICAL EXAM  Temp:  [98.1 F (36.7 C)-98.3 F (36.8 C)] 98.3 F (36.8 C) (06/12 1549) Pulse Rate:  [70-96] 87 (06/12 1545) Resp:  [11-24] 14 (06/12 1545) BP: (122-163)/(71-138) 122/92 (06/12 1545) SpO2:  [94 %-100 %] 100 % (06/12 1545) Weight:  [61.2 kg] 61.2 kg (06/12 0741)  General - Well nourished, well developed, in no apparent distress.  Left arm and left leg drifts, weaker than right. Left hand grip is weaker than right. Able to name objects. Left side sensation deficit in left hand and foot. Right facial droop. Gait is normal. .  Cardiovascular - Regular rhythm and rate.  Mental Status -  Level of arousal and orientation to time, place, and person were intact. Language including expression, naming, repetition, comprehension was assessed and found intact. Right facial  droop.. Attention span and concentration were normal. Recent and remote memory were intact. Fund of Knowledge was assessed and was intact.  Motor Strength - Left arm and left leg drifts, weaker than right.  Left hand grip is weaker than right. Exam was variable with poor effort at times.  She was able to get up and walk to the bathroom independently. Bulk was normal and fasciculations were absent.   Motor Tone - Muscle tone was assessed at the neck and appendages and was normal.  Sensory -  Left side sensation deficit in left hand and foot.  Coordination - The patient had normal movements in the hands and feet with no ataxia or dysmetria.  Tremor was absent.  Gait and Station - normal   ASSESSMENT/PLAN [name] is a 51 y.o. female with history of  prior strokes, hypertension patient states she was recently diagnosed with diabetes and placed on metformin presents to the ER with complaints of left hand and left foot numbness ongoing for the last 1 week.  Patient usually uses crutches to walk.  Has had multiple episodes of stroke previously.  Small acute ischemic infarct in the left basal ganglia and centrum semiovale   CT head No acute abnormality. No acute intracranial findings are seen in the noncontrast CT brain. Atrophy. Small-vessel disease. Small old lacunar infarcts in the basal ganglia appear stable.  CTA head & neck 1. No emergent large vessel occlusion or hemodynamically significant stenosis of the head or neck. 2. Mild bilateral carotid bifurcation atherosclerosis without hemodynamically significant stenosis. 3. Aortic Atherosclerosis  MRI   1. Two small areas of acute ischemia within the left basal ganglia and centrum semiovale. No acute hemorrhage or mass effect. 2. Multiple old small vessel infarcts and findings of chronic ischemic microangiopathy. 3. Numerous chronic microhemorrhages in the central distribution consistent with chronic hypertensive  angiopathy.  2D Echo EF 60-65% EKG SR UDS negative  LDL 155 HgbA1c 6.7 VTE prophylaxis - Lovenox/ SCDs    Diet   Diet heart healthy/carb modified Room service appropriate? Yes; Fluid consistency: Thin   clopidogrel 75 mg daily prior to admission, now on clopidogrel 75 mg daily.  Therapy recommendations:  outpt PT Disposition:  pending  Hypertension Home meds:  norvasc '10mg'$  losartan '50mg'$  daily  Stable Permissive hypertension (OK if < 220/120) but gradually normalize in 5-7 days Long-term BP goal normotensive  Hyperlipidemia Home meds:  atorvastatin '80mg'$ , resumed in hospital LDL 155, goal < 70 Continue statin at discharge  Diabetes type II Controlled Home meds:  metformin HgbA1c 6.7, goal < 7.0 CBGs Recent Labs    02/05/22 0813  GLUCAP 134*    SSI  Other Stroke Risk Factors Advanced Age >/= 79  Cigarette smoker advised to stop smoking Substance abuse -  Patient advised to stop using due to stroke risk. Hx stroke/TIA  Other Active Problems None   Hospital day # 0  Beulah Gandy DNP, ACNPC-AG   ATTENDING ATTESTATION:  Stroke workup complete. PT recommends home PT. Con't plavix. Allergies to aspirin.   Neurology will sign off. Follow up in stroke clinic.  Dr. Reeves Forth evaluated pt independently, reviewed imaging, chart, labs. Discussed and formulated plan with the APP. Please see APP note above for details.     No charge for same day note.   Darcee Dekker,MD   To contact Stroke Continuity provider, please refer to [email or web address] After hours, contact General Neurology

## 2022-02-05 NOTE — ED Notes (Signed)
Tech came to check CBG while pt was eating and was told pt does not want to be disturbed while eating.

## 2022-02-05 NOTE — Evaluation (Signed)
Physical Therapy Evaluation Patient Details Name: Roberta Calderon MRN: [id number] DOB: March 10, 1971 Today's Date: 02/05/2022  History of Present Illness  Pt is a 51 y/o female admitted secondary to L hand and foot numbness. 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 PMH includes HTN, CVA, and DM.  Clinical Impression  Pt admitted secondary to problem above with deficits below. Pt requiring min guard A for mobility tasks. Demonstrated one LOB when turning and required min A. Pt requesting RW to have for home. Recommend continuing outpatient PT at d/c. Will continue to follow acutely.        Recommendations for follow up therapy are one component of a multi-disciplinary discharge planning process, led by the attending physician.  Recommendations may be updated based on patient status, additional functional criteria and insurance authorization.  Follow Up Recommendations Outpatient PT (continue outpatient PT)    Assistance Recommended at Discharge Intermittent Supervision/Assistance  Patient can return home with the following  Assistance with cooking/housework;Assist for transportation;Help with stairs or ramp for entrance    Equipment Recommendations Rolling walker (2 wheels)  Recommendations for Other Services       Functional Status Assessment Patient has had a recent decline in their functional status and demonstrates the ability to make significant improvements in function in a reasonable and predictable amount of time.     Precautions / Restrictions Precautions Precautions: Fall Restrictions Weight Bearing Restrictions: No      Mobility  Bed Mobility Overal bed mobility: Modified Independent                  Transfers Overall transfer level: Needs assistance Equipment used: None Transfers: Sit to/from Stand Sit to Stand: Min guard           General transfer comment: Min guard for safety to stand from stretcher.     Ambulation/Gait Ambulation/Gait assistance: Min guard, Min assist Gait Distance (Feet): 125 Feet Assistive device: None Gait Pattern/deviations: Step-through pattern Gait velocity: Decreased     General Gait Details: One mild LOB when turning and required min A for steadying. Otherwise requiring min guard A. Required safety cues throughout.  Stairs            Wheelchair Mobility    Modified Rankin (Stroke Patients Only)       Balance Overall balance assessment: Mild deficits observed, not formally tested                                           Pertinent Vitals/Pain Pain Assessment Pain Assessment: No/denies pain    Home Living Family/patient expects to be discharged to:: Private residence Living Arrangements: Parent Available Help at Discharge: Family;Available 24 hours/day Type of Home: House Home Access: Stairs to enter Entrance Stairs-Rails: None Entrance Stairs-Number of Steps: 3   Home Layout: One level Home Equipment: None      Prior Function Prior Level of Function : Independent/Modified Independent             Mobility Comments: Pt reports she was not using AD.       Hand Dominance        Extremity/Trunk Assessment   Upper Extremity Assessment Upper Extremity Assessment: Defer to OT evaluation    Lower Extremity Assessment Lower Extremity Assessment: Generalized weakness;LLE deficits/detail LLE Deficits / Details: Reports numbness in L foot    Cervical / Trunk Assessment Cervical /  Trunk Assessment: Normal  Communication   Communication: No difficulties  Cognition Arousal/Alertness: Awake/alert Behavior During Therapy: Impulsive Overall Cognitive Status: No family/caregiver present to determine baseline cognitive functioning                                 General Comments: Impulsive and with decreased safety awareness        General Comments      Exercises     Assessment/Plan     PT Assessment Patient needs continued PT services  PT Problem List Decreased strength;Decreased balance;Decreased mobility;Decreased cognition;Decreased safety awareness;Decreased knowledge of precautions       PT Treatment Interventions DME instruction;Gait training;Stair training;Functional mobility training;Therapeutic activities;Therapeutic exercise;Cognitive remediation    PT Goals (Current goals can be found in the Care Plan section)  Acute Rehab PT Goals Patient Stated Goal: to get a RW PT Goal Formulation: With patient Time For Goal Achievement: 02/19/22 Potential to Achieve Goals: Good    Frequency Min 3X/week     Co-evaluation               AM-PAC PT "6 Clicks" Mobility  Outcome Measure Help needed turning from your back to your side while in a flat bed without using bedrails?: None Help needed moving from lying on your back to sitting on the side of a flat bed without using bedrails?: A Little Help needed moving to and from a bed to a chair (including a wheelchair)?: A Little Help needed standing up from a chair using your arms (e.g., wheelchair or bedside chair)?: A Little Help needed to walk in hospital room?: A Little Help needed climbing 3-5 steps with a railing? : A Little 6 Click Score: 19    End of Session   Activity Tolerance: Patient tolerated treatment well Patient left: in bed;with call bell/phone within reach (on stretcher in ED) Nurse Communication: Mobility status PT Visit Diagnosis: Unsteadiness on feet (R26.81);Muscle weakness (generalized) (M62.81)    Time: 4967-5916 PT Time Calculation (min) (ACUTE ONLY): 17 min   Charges:   PT Evaluation $PT Eval Moderate Complexity: 1 Mod          Reuel Derby, PT, DPT  Acute Rehabilitation Services  Office: [phone]   Rudean Hitt 02/05/2022, 4:09 PM

## 2022-02-05 NOTE — Progress Notes (Signed)
Patient admitted after midnight, please see H&P.  Here with Left hand and feet numbness.  Acute CVA  -MRI brain shows acute CVA  - CT angiogram of the head and neck does not show any large vessel obstruction.   - lipid panel: LDL 155 -hemoglobin A1c : 6.7 -2D echo pending -neurology consult: pt left side stroke on MRI seems not able to explain her left sided numbness, weakness. Her symptoms may be due to DWI negative infarct on the right or recrudescence from prior strokes. Will continue stroke work up and PT/OT/speech. Continue plavix and statin.    recently diagnosed with diabetes mellitus  - hemoglobin A1c: 6.7 -SSI  Hypertension  -hold hydrochlorothiazide s - On amlodipine losartan which will be held for now allowing for permissive hypertension.  Hyperlipidemia  -on statins.  Prior history of polysubstance abuse.  Eulogio Bear DO

## 2022-02-05 NOTE — H&P (Signed)
History and Physical    Roberta Calderon XTK:[id number] DOB: 08-26-1971 DOA: 02/04/2022  PCP: Kerin Perna, NP  Patient coming from: Home.  Chief Complaint: Left hand and feet numbness.  HPI: Roberta Calderon is a 51 y.o. female with history of prior strokes, hypertension patient states she was recently diagnosed with diabetes and placed on metformin presents to the ER with complaints of left hand and left foot numbness ongoing for the last 1 week.  Patient usually uses crutches to walk.  Has had multiple episodes of stroke previously.  ED Course: In the ER on exam patient was able to move all extremities.  MRI of the brain does show acute stroke.  Neurologist on-call was consulted patient admitted for further work-up of stroke.  EKG shows normal sinus rhythm.  Review of Systems: As per HPI, rest all negative.   Past Medical History:  Diagnosis Date   Hypertension     History reviewed. No pertinent surgical history.   reports that she has been smoking cigarettes. She has been smoking an average of .25 packs per day. She has never used smokeless tobacco. She reports current alcohol use. She reports that she does not use drugs.  Allergies  Allergen Reactions   Nsaids Nausea And Vomiting and Rash   Aspirin Hives    Family History  Family history unknown: Yes    Prior to Admission medications   Medication Sig Start Date End Date Taking? Authorizing Provider  amLODipine (NORVASC) 10 MG tablet Take 1 tablet (10 mg total) by mouth daily. 01/10/22 01/10/23  Kerin Perna, NP  atorvastatin (LIPITOR) 80 MG tablet Take 1 tablet (80 mg total) by mouth daily. 09/12/21 09/12/22  Kerin Perna, NP  clopidogrel (PLAVIX) 75 MG tablet Take 1 tablet (75 mg total) by mouth daily. 08/17/21 08/17/22  Caren Griffins, MD  hydrochlorothiazide (HYDRODIURIL) 25 MG tablet Take 1 tablet (25 mg total) by mouth daily. 02/01/22   Kerin Perna, NP  losartan (COZAAR) 50 MG tablet  Take 1 tablet (50 mg total) by mouth daily. 12/11/21   Kerin Perna, NP  metFORMIN (GLUCOPHAGE-XR) 500 MG 24 hr tablet Take 1 tablet (500 mg total) by mouth daily with breakfast. 02/01/22   Kerin Perna, NP  Multiple Vitamins-Minerals (CERTAVITE/ANTIOXIDANTS) TABS Take 1 tablet by mouth daily. 08/18/21   Caren Griffins, MD    Physical Exam: Constitutional: Moderately built and nourished. Vitals:   02/04/22 2230 02/05/22 0000 02/05/22 0045 02/05/22 0242  BP: (!) 140/95 134/85 134/80 137/90  Pulse: 80 80 74 77  Resp: '16 16 16 16  '$ Temp:      TempSrc:      SpO2: 99% 100% 100% 100%   Eyes: Anicteric no pallor. ENMT: No discharge from the ears eyes nose and mouth: Neck: No mass felt.  No neck rigidity. Respiratory: No rhonchi or crepitations. Cardiovascular: S1-S2 heard. Abdomen: Soft nontender bowel sound present. Musculoskeletal: No edema. Skin: No rash. Neurologic: Alert awake oriented time place and person.  Moving all extremities 5 x 5.  Left Hand grip was 4 x 5.  Left lower extremity proximal was 4 x 5.  No facial asymmetry midline pupils equal reactive to light. Psychiatric: Appears normal per normal affect.   Labs on Admission: I have personally reviewed following labs and imaging studies  CBC: Recent Labs  Lab 02/04/22 1358  WBC 5.8  NEUTROABS 2.1  HGB 14.0  HCT 45.2  MCV 79.2*  PLT 339   Basic  Metabolic Panel: Recent Labs  Lab 02/04/22 1358  NA 139  K 3.5  CL 104  CO2 25  GLUCOSE 99  BUN 16  CREATININE 0.87  CALCIUM 10.2   GFR: Estimated Creatinine Clearance: 63.3 mL/min (by C-G formula based on SCr of 0.87 mg/dL). Liver Function Tests: Recent Labs  Lab 02/04/22 1358  AST 21  ALT 22  ALKPHOS 95  BILITOT 0.8  PROT 7.7  ALBUMIN 4.1   No results for input(s): "LIPASE", "AMYLASE" in the last 168 hours. No results for input(s): "AMMONIA" in the last 168 hours. Coagulation Profile: Recent Labs  Lab 02/04/22 1358  INR 1.0   Cardiac  Enzymes: No results for input(s): "CKTOTAL", "CKMB", "CKMBINDEX", "TROPONINI" in the last 168 hours. BNP (last 3 results) No results for input(s): "PROBNP" in the last 8760 hours. HbA1C: No results for input(s): "HGBA1C" in the last 72 hours. CBG: No results for input(s): "GLUCAP" in the last 168 hours. Lipid Profile: No results for input(s): "CHOL", "HDL", "LDLCALC", "TRIG", "CHOLHDL", "LDLDIRECT" in the last 72 hours. Thyroid Function Tests: No results for input(s): "TSH", "T4TOTAL", "FREET4", "T3FREE", "THYROIDAB" in the last 72 hours. Anemia Panel: No results for input(s): "VITAMINB12", "FOLATE", "FERRITIN", "TIBC", "IRON", "RETICCTPCT" in the last 72 hours. Urine analysis:    Component Value Date/Time   COLORURINE STRAW (A) 02/07/2021 1014   APPEARANCEUR CLEAR 02/07/2021 1014   LABSPEC 1.011 02/07/2021 1014   PHURINE 6.0 02/07/2021 1014   GLUCOSEU 50 (A) 02/07/2021 1014   HGBUR NEGATIVE 02/07/2021 1014   BILIRUBINUR NEGATIVE 02/07/2021 1014   KETONESUR NEGATIVE 02/07/2021 1014   PROTEINUR NEGATIVE 02/07/2021 1014   NITRITE NEGATIVE 02/07/2021 1014   LEUKOCYTESUR TRACE (A) 02/07/2021 1014   Sepsis Labs: '@LABRCNTIP'$ (procalcitonin:4,lacticidven:4) ) Recent Results (from the past 240 hour(s))  Resp Panel by RT-PCR (Flu A&B, Covid) Anterior Nasal Swab     Status: None   Collection Time: 02/04/22  8:39 PM   Specimen: Anterior Nasal Swab  Result Value Ref Range Status   SARS Coronavirus 2 by RT PCR NEGATIVE NEGATIVE Final    Comment: (NOTE) SARS-CoV-2 target nucleic acids are NOT DETECTED.  The SARS-CoV-2 RNA is generally detectable in upper respiratory specimens during the acute phase of infection. The lowest concentration of SARS-CoV-2 viral copies this assay can detect is 138 copies/mL. A negative result does not preclude SARS-Cov-2 infection and should not be used as the sole basis for treatment or other patient management decisions. A negative result may occur with   improper specimen collection/handling, submission of specimen other than nasopharyngeal swab, presence of viral mutation(s) within the areas targeted by this assay, and inadequate number of viral copies(<138 copies/mL). A negative result must be combined with clinical observations, patient history, and epidemiological information. The expected result is Negative.  Fact Sheet for Patients:  EntrepreneurPulse.com.au  Fact Sheet for Healthcare Providers:  IncredibleEmployment.be  This test is no t yet approved or cleared by the [place] FDA and  has been authorized for detection and/or diagnosis of SARS-CoV-2 by FDA under an Emergency Use Authorization (EUA). This EUA will remain  in effect (meaning this test can be used) for the duration of the COVID-19 declaration under Section 564(b)(1) of the Act, 21 U.S.C.section 360bbb-3(b)(1), unless the authorization is terminated  or revoked sooner.       Influenza A by PCR NEGATIVE NEGATIVE Final   Influenza B by PCR NEGATIVE NEGATIVE Final    Comment: (NOTE) The Xpert Xpress SARS-CoV-2/FLU/RSV plus assay is intended as an aid  in the diagnosis of influenza from Nasopharyngeal swab specimens and should not be used as a sole basis for treatment. Nasal washings and aspirates are unacceptable for Xpert Xpress SARS-CoV-2/FLU/RSV testing.  Fact Sheet for Patients: EntrepreneurPulse.com.au  Fact Sheet for Healthcare Providers: IncredibleEmployment.be  This test is not yet approved or cleared by the [place] FDA and has been authorized for detection and/or diagnosis of SARS-CoV-2 by FDA under an Emergency Use Authorization (EUA). This EUA will remain in effect (meaning this test can be used) for the duration of the COVID-19 declaration under Section 564(b)(1) of the Act, 21 U.S.C. section 360bbb-3(b)(1), unless the authorization is terminated  or revoked.  Performed at [facility], [place] [address], [place], [place] [address]      Radiological Exams on Admission: CT ANGIO HEAD NECK W WO CM  Result Date: 02/05/2022 CLINICAL DATA:  Stroke EXAM: CT ANGIOGRAPHY HEAD AND NECK TECHNIQUE: Multidetector CT imaging of the head and neck was performed using the standard protocol during bolus administration of intravenous contrast. Multiplanar CT image reconstructions and MIPs were obtained to evaluate the vascular anatomy. Carotid stenosis measurements (when applicable) are obtained utilizing NASCET criteria, using the distal internal carotid diameter as the denominator. RADIATION DOSE REDUCTION: This exam was performed according to the departmental dose-optimization program which includes automated exposure control, adjustment of the mA and/or kV according to patient size and/or use of iterative reconstruction technique. CONTRAST:  23m OMNIPAQUE IOHEXOL 350 MG/ML SOLN COMPARISON:  None Available. FINDINGS: CT HEAD FINDINGS Brain: There is no mass, hemorrhage or extra-axial collection. The size and configuration of the ventricles and extra-axial CSF spaces are normal. There is no acute or chronic infarction. The brain parenchyma is normal. Skull: The visualized skull base, calvarium and extracranial soft tissues are normal. Sinuses/Orbits: No fluid levels or advanced mucosal thickening of the visualized paranasal sinuses. No mastoid or middle ear effusion. The orbits are normal. CTA NECK FINDINGS SKELETON: There is no bony spinal canal stenosis. No lytic or blastic lesion. OTHER NECK: Normal pharynx, larynx and major salivary glands. No cervical lymphadenopathy. Unremarkable thyroid gland. UPPER CHEST: No pneumothorax or pleural effusion. No nodules or masses. AORTIC ARCH: There is calcific atherosclerosis of the aortic arch. There is no aneurysm, dissection or hemodynamically significant stenosis of the visualized portion of the aorta. Conventional  3 vessel aortic branching pattern. The visualized proximal subclavian arteries are widely patent. RIGHT CAROTID SYSTEM: No dissection, occlusion or aneurysm. Mild atherosclerotic calcification at the carotid bifurcation without hemodynamically significant stenosis. LEFT CAROTID SYSTEM: No dissection, occlusion or aneurysm. Mild atherosclerotic calcification at the carotid bifurcation without hemodynamically significant stenosis. VERTEBRAL ARTERIES: Left dominant configuration. Both origins are clearly patent. There is no dissection, occlusion or flow-limiting stenosis to the skull base (V1-V3 segments). CTA HEAD FINDINGS POSTERIOR CIRCULATION: --Vertebral arteries: Normal V4 segments. --Inferior cerebellar arteries: Normal. --Basilar artery: Normal. --Superior cerebellar arteries: Normal. --Posterior cerebral arteries (PCA): Normal. ANTERIOR CIRCULATION: --Intracranial internal carotid arteries: Atherosclerotic calcification of the internal carotid arteries at the skull base without hemodynamically significant stenosis. --Anterior cerebral arteries (ACA): Normal. Both A1 segments are present. Patent anterior communicating artery (a-comm). --Middle cerebral arteries (MCA): Normal. VENOUS SINUSES: As permitted by contrast timing, patent. ANATOMIC VARIANTS: None Review of the MIP images confirms the above findings. IMPRESSION: 1. No emergent large vessel occlusion or hemodynamically significant stenosis of the head or neck. 2. Mild bilateral carotid bifurcation atherosclerosis without hemodynamically significant stenosis. 3. Aortic Atherosclerosis (ICD10-I70.0). Electronically Signed   By: KCletus GashD.  On: 02/05/2022 03:22   MR BRAIN WO CONTRAST  Result Date: 02/04/2022 CLINICAL DATA:  Left arm weakness EXAM: MRI HEAD WITHOUT CONTRAST TECHNIQUE: Multiplanar, multiecho pulse sequences of the brain and surrounding structures were obtained without intravenous contrast. COMPARISON:  None Available. FINDINGS:  Brain: There are 2 small areas of acute ischemia within the left basal ganglia and centrum semiovale. Multiple chronic microhemorrhages in a predominantly central distribution. There is multifocal hyperintense T2-weighted signal within the white matter. Generalized volume loss. Multiple old small vessel infarcts of the deep gray nuclei, brainstem and cerebellum. Vascular: Major flow voids are preserved. Skull and upper cervical spine: Normal calvarium and skull base. Visualized upper cervical spine and soft tissues are normal. Sinuses/Orbits:No paranasal sinus fluid levels or advanced mucosal thickening. No mastoid or middle ear effusion. Normal orbits. IMPRESSION: 1. Two small areas of acute ischemia within the left basal ganglia and centrum semiovale. No acute hemorrhage or mass effect. 2. Multiple old small vessel infarcts and findings of chronic ischemic microangiopathy. 3. Numerous chronic microhemorrhages in the central distribution consistent with chronic hypertensive angiopathy. Electronically Signed   By: Ulyses Jarred M.D.   On: 02/04/2022 23:42   DG Chest 2 View  Result Date: 02/04/2022 CLINICAL DATA:  Chest pain EXAM: CHEST - 2 VIEW COMPARISON:  02/07/2021 FINDINGS: Right basilar atelectasis or scarring. Left lung clear. Heart is normal size. No effusions or acute bony abnormality. IMPRESSION: Right base atelectasis or scarring.  No active disease. Electronically Signed   By: Rolm Baptise M.D.   On: 02/04/2022 19:18   CT HEAD WO CONTRAST  Result Date: 02/04/2022 CLINICAL DATA:  TIA EXAM: CT HEAD WITHOUT CONTRAST TECHNIQUE: Contiguous axial images were obtained from the base of the skull through the vertex without intravenous contrast. RADIATION DOSE REDUCTION: This exam was performed according to the departmental dose-optimization program which includes automated exposure control, adjustment of the mA and/or kV according to patient size and/or use of iterative reconstruction technique.  COMPARISON:  08/17/2021 FINDINGS: Brain: No acute intracranial findings are seen. There is interval resolution of small focus bleeding in the left frontal lobe adjacent to the posterior aspect of left sylvian fissure since 08/17/2021. In the current study, there are no signs of bleeding within the cranium. There is decreased density in the periventricular and subcortical white matter suggesting small-vessel disease. Small old lacunar infarcts are seen in the basal ganglia with no significant change. Vascular: Unremarkable. Skull: Unremarkable. Sinuses/Orbits: Unremarkable. Other: None. IMPRESSION: No acute intracranial findings are seen in the noncontrast CT brain. Atrophy. Small-vessel disease. Small old lacunar infarcts in the basal ganglia appear stable. Electronically Signed   By: Elmer Picker M.D.   On: 02/04/2022 14:30    EKG: Independently reviewed.  Normal sinus rhythm.  Assessment/Plan Principal Problem:   Acute CVA (cerebrovascular accident) [facility]) Active Problems:   Hypertension    Acute CVA -MRI brain shows acute CVA with CT angiogram of the head and neck does not show any large vessel obstruction.  Patient did pass stroke swallow.  Check lipid panel hemoglobin A1c 2D echo patient is on Plavix statins. Patient states she was recently diagnosed with diabetes mellitus we will check hemoglobin A1c last 1 6 months ago was around 6.6.  On sliding scale coverage. Hypertension hold hydrochlorothiazide since patient is receiving fluids.  On amlodipine losartan which will be held for now allowing for permissive hypertension.  IV hydralazine. Hyperlipidemia on statins. Prior history of polysubstance abuse.   DVT prophylaxis: Lovenox. Code Status: Full code.  Family Communication: Discussed with patient. Disposition Plan: Home. Consults called: Neurology. Admission status: Observation.   Rise Patience MD [facility] Pager [phone].  If 7PM-7AM, please contact  night-coverage [email or web address] Password [facility]  02/05/2022, 3:41 AM

## 2022-02-05 NOTE — Progress Notes (Signed)
  Echocardiogram 2D Echocardiogram has been performed.  Joette Catching 02/05/2022, 10:08 AM

## 2022-02-06 DIAGNOSIS — I10 Essential (primary) hypertension: Secondary | ICD-10-CM | POA: Diagnosis not present

## 2022-02-06 LAB — GLUCOSE, CAPILLARY: Glucose-Capillary: 125 mg/dL — ABNORMAL HIGH (ref 70–99)

## 2022-02-06 MED ORDER — ATORVASTATIN CALCIUM 80 MG PO TABS: 80.0000 mg | ORAL_TABLET | Freq: Every day | ORAL | 1 refills | Status: DC

## 2022-02-06 MED ORDER — METFORMIN HCL ER 500 MG PO TB24: 500.0000 mg | ORAL_TABLET | Freq: Two times a day (BID) | ORAL | 3 refills | Status: DC

## 2022-02-06 MED ORDER — LOSARTAN POTASSIUM 50 MG PO TABS: 50.0000 mg | ORAL_TABLET | Freq: Every day | ORAL | 1 refills | Status: DC

## 2022-02-06 MED ORDER — CLOPIDOGREL BISULFATE 75 MG PO TABS: 75.0000 mg | ORAL_TABLET | Freq: Every day | ORAL | 3 refills | Status: DC

## 2022-02-06 NOTE — TOC Transition Note (Addendum)
Transition of Care [facility]) - CM/SW Discharge Note   Patient Details  Name: Roberta Calderon MRN: [id number] Date of Birth: 09-05-1970  [facility] CM/SW Contact:  Pollie Friar, RN Phone Number: 02/06/2022, 10:58 AM   Clinical Narrative:    Patient discharging home with continuation of outpatient therapy through [facility]. Orders in [place] and information on the AVS.  Walker for home to be delivered to the room per [employer].  SCAT application provided to the patient. CM also sent information to financial counseling for regular medicaid as pt states hers is only family planning.  CM provided her with number for DSS so she can talk with DSS worker about medicaid transportation. Pt states she has no transport home and no money for transport. CM has provided the bedside RN with cab voucher as pt needs walker for ambulation.   1130: pt receive a walker in 2022 so is not eligible for another walker. Cm has updated the patient and she states it was [facility] at [facility]. She does have her grandmothers walker at home she can use. CM has updated the bedside RN.   Final next level of care: OP Rehab Barriers to Discharge: No Barriers Identified   Patient Goals and CMS Choice     Choice offered to / list presented to : Patient  Discharge Placement                       Discharge Plan and Services                DME Arranged: Walker rolling DME Agency: [employer] Date DME Agency Contacted: 02/06/22   Representative spoke with at DME Agency: [place] Interventions     Readmission Risk Interventions     No data to display

## 2022-02-06 NOTE — Evaluation (Signed)
Occupational Therapy Evaluation Patient Details Name: Roberta Calderon MRN: [id number] DOB: Sep 05, 1970 Today's Date: 02/06/2022   History of Present Illness Pt is a 51 y/o female admitted secondary to L hand and foot numbness. 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 PMH includes HTN, CVA, and DM.   Clinical Impression   PTA patient reports living with her mom (who works during the day), completing ADLs and mobility with independence and using public transportation.  She was admitted for above and presents with problem list below, including impaired safety awareness/problem solving, decreased activity tolerance, impaired balance, and L UE pain.  Patient currently requires min guard for transfers and mobility using RW, min guard for ADLs.  Assessment limited by pt participation, self limiting at times and initially declining OOB due to L UE pain.  When agreeable pt able to use L UE without difficulty.  Functionally she has difficulty maneuvering in hallway, running into 90% of items on L side with poor awareness and correction without cueing.  Patient will benefit from further visual and cognitive assessment, pending pt participation and agreeableness. At this time recommend assist with medication, cooking and navigating public transportation. Based of performance today, believe she will benefit from further OT services acutely and after dc to optimize independence, safety and return to PLOF.      Recommendations for follow up therapy are one component of a multi-disciplinary discharge planning process, led by the attending physician.  Recommendations may be updated based on patient status, additional functional criteria and insurance authorization.   Follow Up Recommendations  Outpatient OT (vs HH pending transportation)    Assistance Recommended at Discharge Frequent or constant Supervision/Assistance  Patient can return home with the following A little help with walking and/or  transfers;A little help with bathing/dressing/bathroom;Assistance with cooking/housework;Direct supervision/assist for medications management;Direct supervision/assist for financial management;Assist for transportation;Help with stairs or ramp for entrance    Functional Status Assessment  Patient has had a recent decline in their functional status and demonstrates the ability to make significant improvements in function in a reasonable and predictable amount of time.  Equipment Recommendations  Tub/shower seat    Recommendations for Other Services       Precautions / Restrictions Precautions Precautions: Fall Restrictions Weight Bearing Restrictions: No      Mobility Bed Mobility Overal bed mobility: Modified Independent                  Transfers                          Balance Overall balance assessment: Mild deficits observed, not formally tested                                         ADL either performed or assessed with clinical judgement   ADL Overall ADL's : Needs assistance/impaired     Grooming: Min guard;Standing           Upper Body Dressing : Set up;Sitting   Lower Body Dressing: Min guard;Sit to/from stand   Toilet Transfer: Min guard;Ambulation;Rolling walker (2 wheels)           Functional mobility during ADLs: Min guard;Rolling walker (2 wheels) General ADL Comments: pt tends to run into objects on L side using RW, improving awareness during session but does not avoid them  Vision   Additional Comments: did not test due to pt participation, but tends to run into objects on L side.  She denies visual changes. REcommend further assessment as able.     Perception     Praxis      Pertinent Vitals/Pain Pain Assessment Pain Assessment: 0-10 Pain Score: 6  Pain Location: L arm Pain Descriptors / Indicators:  (just "hurts") Pain Intervention(s): Monitored during session     Hand Dominance Right    Extremity/Trunk Assessment Upper Extremity Assessment Upper Extremity Assessment: LUE deficits/detail;Generalized weakness LUE Deficits / Details: initally limited by pain reporting unable to lift or make fist, pt functionally able to don socks and hold onto walker. LUE: Unable to fully assess due to pain LUE Coordination: decreased fine motor;decreased gross motor   Lower Extremity Assessment Lower Extremity Assessment: Defer to PT evaluation   Cervical / Trunk Assessment Cervical / Trunk Assessment: Normal   Communication Communication Communication: No difficulties   Cognition Arousal/Alertness: Awake/alert Behavior During Therapy: Impulsive Overall Cognitive Status: No family/caregiver present to determine baseline cognitive functioning Area of Impairment: Following commands, Safety/judgement, Awareness, Problem solving, Attention                   Current Attention Level: Sustained   Following Commands: Follows one step commands consistently, Follows one step commands with increased time, Follows multi-step commands inconsistently Safety/Judgement: Decreased awareness of safety, Decreased awareness of deficits Awareness: Emergent Problem Solving: Slow processing, Decreased initiation, Requires verbal cues General Comments: pt impulsive with decreased safety awareness, tends to run into objects on L side using RW and poor understanding of deficits.  She presents self limiting and has limited verbalizations during session, initally refusing to get OOB or engage bc her L arm pain, but when agreeable pt using L arm with no difficulites.  plan for further testing with pill box test if pt is agreeable.     General Comments  pt reports her mom works during the day and she takes public transportation, per PT note her mom is availabe to assist.    Exercises     Shoulder Instructions      Home Living Family/patient expects to be discharged to:: Private residence Living  Arrangements: Parent Available Help at Discharge: Family;Available PRN/intermittently Type of Home: House Home Access: Stairs to enter [employer] of Steps: 3 Entrance Stairs-Rails: None Home Layout: One level     Bathroom Shower/Tub: Teacher, early years/pre: Standard     Home Equipment: None   Additional Comments: mother works during the day      Prior Functioning/Environment Prior Level of Function : Independent/Modified Independent             Mobility Comments: reports not using AD but was trying to get cane/RW prior to admission ADLs Comments: independent ADLs, her mom completes cooking but pt completes medications        OT Problem List: Decreased strength;Decreased activity tolerance;Decreased range of motion;Impaired balance (sitting and/or standing);Impaired vision/perception;Decreased coordination;Decreased cognition;Decreased safety awareness;Decreased knowledge of use of DME or AE;Decreased knowledge of precautions;Pain;Impaired sensation      OT Treatment/Interventions: Self-care/ADL training;Therapeutic exercise;DME and/or AE instruction;Therapeutic activities;Visual/perceptual remediation/compensation;Patient/family education;Balance training;Neuromuscular education    OT Goals(Current goals can be found in the care plan section) Acute Rehab OT Goals Patient Stated Goal: home OT Goal Formulation: With patient Time For Goal Achievement: 02/20/22 Potential to Achieve Goals: Good  OT Frequency: Min 2X/week    Co-evaluation  AM-PAC OT "6 Clicks" Daily Activity     Outcome Measure Help from another person eating meals?: A Little Help from another person taking care of personal grooming?: A Little Help from another person toileting, which includes using toliet, bedpan, or urinal?: A Little Help from another person bathing (including washing, rinsing, drying)?: A Little Help from another person to put on and taking off  regular upper body clothing?: A Little Help from another person to put on and taking off regular lower body clothing?: A Little 6 Click Score: 18   End of Session Equipment Utilized During Treatment: Rolling walker (2 wheels) Nurse Communication: Mobility status  Activity Tolerance: Patient tolerated treatment well (limited by engagement) Patient left: in bed;with bed alarm set;with call bell/phone within reach  OT Visit Diagnosis: Other abnormalities of gait and mobility (R26.89);Muscle weakness (generalized) (M62.81);Pain;Other symptoms and signs involving cognitive function Pain - Right/Left: Left Pain - part of body: Shoulder;Arm;Hand                Time: 2633-3545 OT Time Calculation (min): 11 min Charges:  OT General Charges $OT Visit: 1 Visit OT Evaluation $OT Eval Moderate Complexity: 1 Mod  Jolaine Artist, OT Acute Rehabilitation Services Office [phone]   Delight Stare 02/06/2022, 9:55 AM

## 2022-02-06 NOTE — Discharge Summary (Addendum)
Physician Discharge Summary   Roberta Calderon EQA:[id number] DOB: 1971/01/09 DOA: 02/04/2022  PCP: Kerin Perna, NP  Admit date: 02/04/2022 Discharge date: 02/06/2022   Admitted From: Home Disposition:  Home Discharging physician: Dwyane Dee, MD  Recommendations for Outpatient Follow-up:  Follow up with neurology  Home Health: PT Equipment/Devices: RW  Discharge Condition: stable CODE STATUS: Full Diet recommendation:  Diet Orders (From admission, onward)     Start     Ordered   02/06/22 0000  Diet - low sodium heart healthy        02/06/22 0954   02/06/22 0000  Diet Carb Modified        02/06/22 0954   02/05/22 0345  Diet heart healthy/carb modified Room service appropriate? Yes; Fluid consistency: Thin  Diet effective now       Question Answer Comment  Diet-HS Snack? Nothing   Room service appropriate? Yes   Fluid consistency: Thin      02/05/22 [facility] Course:  Acute CVA  -MRI brain noted with 2 small areas of acute ischemia involving left basal ganglia and centrum semiovale - Evaluated by neurology - Continued on Plavix and statin at discharge - Outpatient follow-up with stroke clinic  HTN -Home meds continued and refilled at discharge  HLD - Statin refilled and continued at discharge  DM II -A1c 6.7% on 02/01/2022    The patient's chronic medical conditions were treated accordingly per the patient's home medication regimen except as noted.  On day of discharge, patient was felt deemed stable for discharge. Patient/family member advised to call PCP or come back to ER if needed.   Principal Diagnosis: Acute CVA (cerebrovascular accident) [facility])  Discharge Diagnoses: Principal Problem:   Acute CVA (cerebrovascular accident) [facility]) Active Problems:   Hypertension   Discharge Instructions     Ambulatory referral to Occupational Therapy   Complete by: As directed    Ambulatory referral to Physical Therapy   Complete by:  As directed    Ambulatory referral to Physical Therapy   Complete by: As directed    Diet - low sodium heart healthy   Complete by: As directed    Diet Carb Modified   Complete by: As directed    Increase activity slowly   Complete by: As directed       Allergies as of 02/06/2022       Reactions   Nsaids Nausea And Vomiting, Rash   Aspirin Hives        Medication List     TAKE these medications    amLODipine 10 MG tablet Commonly known as: NORVASC Take 1 tablet (10 mg total) by mouth daily.   atorvastatin 80 MG tablet Commonly known as: Lipitor Take 1 tablet (80 mg total) by mouth daily.   CertaVite/Antioxidants Tabs Take 1 tablet by mouth daily.   clopidogrel 75 MG tablet Commonly known as: Plavix Take 1 tablet (75 mg total) by mouth daily.   hydrochlorothiazide 25 MG tablet Commonly known as: HYDRODIURIL Take 1 tablet (25 mg total) by mouth daily.   losartan 50 MG tablet Commonly known as: COZAAR Take 1 tablet (50 mg total) by mouth daily.   metFORMIN 500 MG 24 hr tablet Commonly known as: GLUCOPHAGE-XR Take 1 tablet (500 mg total) by mouth in the morning and at bedtime.               Durable Medical Equipment  (From admission, onward)  Start     Ordered   02/06/22 0741  For home use only DME Walker rolling  Once       Question Answer Comment  Walker: With [place] Wheels   Patient needs a walker to treat with the following condition Acute CVA (cerebrovascular accident) ([place])      02/06/22 [place] [facility] Follow up.   Why: Call regarding your medicaid Contact information: [address]  [phone]        [place] Follow up.   Specialty: Rehabilitation Why: Contact the outpatient rehab to see when your next visit is scheduled Contact information: [place] [id number] [place] [address] [phone]               Allergies  Allergen Reactions   Nsaids Nausea And Vomiting and Rash   Aspirin Hives    Consultations: Neurology  Procedures:   Discharge Exam: BP (!) 146/101 (BP Location: Right Arm) Comment: RN Notified  Pulse 88   Temp 98.9 F (37.2 C) (Oral)   Resp 15   Ht '5\' 2"'$  (1.575 m)   Wt 61.2 kg   SpO2 97%   BMI 24.69 kg/m  Physical Exam Constitutional:      Appearance: Normal appearance.  HENT:     Head: Normocephalic and atraumatic.     Mouth/Throat:     Mouth: Mucous membranes are moist.  Eyes:     Extraocular Movements: Extraocular movements intact.  Pulmonary:     Effort: Pulmonary effort is normal.     Breath sounds: Normal breath sounds.  Abdominal:     General: Bowel sounds are normal. There is no distension.     Palpations: Abdomen is soft.     Tenderness: There is no abdominal tenderness.  Musculoskeletal:        General: Normal range of motion.     Cervical back: Normal range of motion and neck supple.  Skin:    General: Skin is warm and dry.  Neurological:     Mental Status: She is alert.     Comments: 4/5 strength appreciated with left upper and lower extremity.    Psychiatric:        Mood and Affect: Mood normal.      The results of significant diagnostics from this hospitalization (including imaging, microbiology, ancillary and laboratory) are listed below for reference.   Microbiology: Recent Results (from the past 240 hour(s))  Resp Panel by RT-PCR (Flu A&B, Covid) Anterior Nasal Swab     Status: None   Collection Time: 02/04/22  8:39 PM   Specimen: Anterior Nasal Swab  Result Value Ref Range Status   SARS Coronavirus 2 by RT PCR NEGATIVE NEGATIVE Final    Comment: (NOTE) SARS-CoV-2 target nucleic acids are NOT DETECTED.  The SARS-CoV-2 RNA is generally detectable in upper respiratory specimens during the acute phase of infection. The  lowest concentration of SARS-CoV-2 viral copies this assay can detect is 138 copies/mL. A negative result does not preclude SARS-Cov-2 infection and should not be used as the sole basis for treatment or other patient management decisions. A negative result may occur with  improper specimen collection/handling, submission of specimen other than nasopharyngeal swab, presence of viral mutation(s) within the areas targeted by this assay, and inadequate number of viral copies(<138 copies/mL). A negative result must be combined  with clinical observations, patient history, and epidemiological information. The expected result is Negative.  Fact Sheet for Patients:  EntrepreneurPulse.com.au  Fact Sheet for Healthcare Providers:  IncredibleEmployment.be  This test is no t yet approved or cleared by the [place] FDA and  has been authorized for detection and/or diagnosis of SARS-CoV-2 by FDA under an Emergency Use Authorization (EUA). This EUA will remain  in effect (meaning this test can be used) for the duration of the COVID-19 declaration under Section 564(b)(1) of the Act, 21 U.S.C.section 360bbb-3(b)(1), unless the authorization is terminated  or revoked sooner.       Influenza A by PCR NEGATIVE NEGATIVE Final   Influenza B by PCR NEGATIVE NEGATIVE Final    Comment: (NOTE) The Xpert Xpress SARS-CoV-2/FLU/RSV plus assay is intended as an aid in the diagnosis of influenza from Nasopharyngeal swab specimens and should not be used as a sole basis for treatment. Nasal washings and aspirates are unacceptable for Xpert Xpress SARS-CoV-2/FLU/RSV testing.  Fact Sheet for Patients: EntrepreneurPulse.com.au  Fact Sheet for Healthcare Providers: IncredibleEmployment.be  This test is not yet approved or cleared by the [place] FDA and has been authorized for detection and/or diagnosis of SARS-CoV-2 by FDA under  an Emergency Use Authorization (EUA). This EUA will remain in effect (meaning this test can be used) for the duration of the COVID-19 declaration under Section 564(b)(1) of the Act, 21 U.S.C. section 360bbb-3(b)(1), unless the authorization is terminated or revoked.  Performed at [facility], [place] [address], [place], [place] [address]      Labs: BNP (last 3 results) No results for input(s): "BNP" in the last 8760 hours. Basic Metabolic Panel: Recent Labs  Lab 02/04/22 1358 02/05/22 0510  NA 139 137  K 3.5 3.5  CL 104 103  CO2 25 26  GLUCOSE 99 100*  BUN 16 14  CREATININE 0.87 0.87  CALCIUM 10.2 9.8   Liver Function Tests: Recent Labs  Lab 02/04/22 1358 02/05/22 0510  AST 21 19  ALT 22 19  ALKPHOS 95 87  BILITOT 0.8 0.6  PROT 7.7 7.3  ALBUMIN 4.1 3.7   No results for input(s): "LIPASE", "AMYLASE" in the last 168 hours. No results for input(s): "AMMONIA" in the last 168 hours. CBC: Recent Labs  Lab 02/04/22 1358 02/05/22 0510  WBC 5.8 5.8  NEUTROABS 2.1  --   HGB 14.0 13.6  HCT 45.2 43.8  MCV 79.2* 78.5*  PLT 339 315   Cardiac Enzymes: No results for input(s): "CKTOTAL", "CKMB", "CKMBINDEX", "TROPONINI" in the last 168 hours. BNP: Invalid input(s): "POCBNP" CBG: Recent Labs  Lab 02/05/22 0813 02/05/22 1144 02/05/22 1750 02/05/22 2236 02/06/22 0649  GLUCAP 134* 87 121* 134* 125*   D-Dimer No results for input(s): "DDIMER" in the last 72 hours. Hgb A1c No results for input(s): "HGBA1C" in the last 72 hours. Lipid Profile Recent Labs    02/05/22 0510  CHOL 227*  HDL 58  LDLCALC 155*  TRIG 71  CHOLHDL 3.9   Thyroid function studies No results for input(s): "TSH", "T4TOTAL", "T3FREE", "THYROIDAB" in the last 72 hours.  Invalid input(s): "FREET3" Anemia work up No results for input(s): "VITAMINB12", "FOLATE", "FERRITIN", "TIBC", "IRON", "RETICCTPCT" in the last 72 hours. Urinalysis    Component Value Date/Time   COLORURINE  STRAW (A) 02/07/2021 1014   APPEARANCEUR CLEAR 02/07/2021 1014   LABSPEC 1.011 02/07/2021 1014   PHURINE 6.0 02/07/2021 1014   GLUCOSEU 50 (A) 02/07/2021 1014   HGBUR NEGATIVE 02/07/2021  [place] 02/07/2021 [place] 02/07/2021 1014   PROTEINUR NEGATIVE 02/07/2021 1014   NITRITE NEGATIVE 02/07/2021 1014   LEUKOCYTESUR TRACE (A) 02/07/2021 1014   Sepsis Labs Recent Labs  Lab 02/04/22 1358 02/05/22 0510  WBC 5.8 5.8   Microbiology Recent Results (from the past 240 hour(s))  Resp Panel by RT-PCR (Flu A&B, Covid) Anterior Nasal Swab     Status: None   Collection Time: 02/04/22  8:39 PM   Specimen: Anterior Nasal Swab  Result Value Ref Range Status   SARS Coronavirus 2 by RT PCR NEGATIVE NEGATIVE Final    Comment: (NOTE) SARS-CoV-2 target nucleic acids are NOT DETECTED.  The SARS-CoV-2 RNA is generally detectable in upper respiratory specimens during the acute phase of infection. The lowest concentration of SARS-CoV-2 viral copies this assay can detect is 138 copies/mL. A negative result does not preclude SARS-Cov-2 infection and should not be used as the sole basis for treatment or other patient management decisions. A negative result may occur with  improper specimen collection/handling, submission of specimen other than nasopharyngeal swab, presence of viral mutation(s) within the areas targeted by this assay, and inadequate number of viral copies(<138 copies/mL). A negative result must be combined with clinical observations, patient history, and epidemiological information. The expected result is Negative.  Fact Sheet for Patients:  EntrepreneurPulse.com.au  Fact Sheet for Healthcare Providers:  IncredibleEmployment.be  This test is no t yet approved or cleared by the [place] FDA and  has been authorized for detection and/or diagnosis of SARS-CoV-2 by FDA under an Emergency Use Authorization  (EUA). This EUA will remain  in effect (meaning this test can be used) for the duration of the COVID-19 declaration under Section 564(b)(1) of the Act, 21 U.S.C.section 360bbb-3(b)(1), unless the authorization is terminated  or revoked sooner.       Influenza A by PCR NEGATIVE NEGATIVE Final   Influenza B by PCR NEGATIVE NEGATIVE Final    Comment: (NOTE) The Xpert Xpress SARS-CoV-2/FLU/RSV plus assay is intended as an aid in the diagnosis of influenza from Nasopharyngeal swab specimens and should not be used as a sole basis for treatment. Nasal washings and aspirates are unacceptable for Xpert Xpress SARS-CoV-2/FLU/RSV testing.  Fact Sheet for Patients: EntrepreneurPulse.com.au  Fact Sheet for Healthcare Providers: IncredibleEmployment.be  This test is not yet approved or cleared by the [place] FDA and has been authorized for detection and/or diagnosis of SARS-CoV-2 by FDA under an Emergency Use Authorization (EUA). This EUA will remain in effect (meaning this test can be used) for the duration of the COVID-19 declaration under Section 564(b)(1) of the Act, 21 U.S.C. section 360bbb-3(b)(1), unless the authorization is terminated or revoked.  Performed at [facility], [place] [address] [place], [place] [address]     Procedures/Studies: ECHOCARDIOGRAM COMPLETE  Result Date: 02/05/2022    ECHOCARDIOGRAM REPORT   Patient Name:   Roberta Calderon Date of Exam: 02/05/2022 Medical Rec #:  [id number]          Height:       62.0 in Accession #:    [id number]         Weight:       135.0 lb Date of Birth:  [birth year]           BSA:          1.618 m Patient Age:    51 years           BP:  133/103 mmHg Patient Gender: F                  HR:           77 bpm. Exam Location:  Inpatient Procedure: 2D Echo, Cardiac Doppler and Color Doppler Indications:    Stroke  History:        Patient has prior history of Echocardiogram examinations, most                  recent 08/15/2021. Stroke; Risk Factors:Hypertension, Diabetes                 and Current Smoker. Alcohol abuse.  Sonographer:    Joette Catching RCS Referring Phys: [place]  Sonographer Comments: No subcostal window. Image acquisition challenging due to uncooperative patient. Pt refused to continue exam for subcostal and suprasternal notch views. IMPRESSIONS  1. Left ventricular ejection fraction, by estimation, is 60 to 65%. The left ventricle has normal function. The left ventricle has no regional wall motion abnormalities. There is severe asymmetric left ventricular hypertrophy. Left ventricular diastolic  parameters are consistent with Grade I diastolic dysfunction (impaired relaxation).  2. Right ventricular systolic function is normal. The right ventricular size is normal. Tricuspid regurgitation signal is inadequate for assessing PA pressure.  3. The mitral valve is normal in structure. No evidence of mitral valve regurgitation.  4. There is mild calcification of the aortic valve. Aortic valve regurgitation is not visualized.  5. Aortic no significant ascending aortic aneurysm.  6. IVC was not well visualized. Comparison(s): No significant change from prior study. Conclusion(s)/Recommendation(s): Recommend blood pressure management. FINDINGS  Left Ventricle: Left ventricular ejection fraction, by estimation, is 60 to 65%. The left ventricle has normal function. The left ventricle has no regional wall motion abnormalities. The left ventricular internal cavity size was normal in size. There is  severe asymmetric left ventricular hypertrophy. Left ventricular diastolic parameters are consistent with Grade I diastolic dysfunction (impaired relaxation). Right Ventricle: The right ventricular size is normal. Right vetricular wall thickness was not well visualized. Right ventricular systolic function is normal. Tricuspid regurgitation signal is inadequate for assessing PA pressure.  Left Atrium: Left atrial size was normal in size. Right Atrium: Right atrial size was normal in size. Pericardium: There is no evidence of pericardial effusion. Mitral Valve: The mitral valve is normal in structure. No evidence of mitral valve regurgitation. Tricuspid Valve: The tricuspid valve is normal in structure. Tricuspid valve regurgitation is not demonstrated. Aortic Valve: There is mild calcification of the aortic valve. Aortic valve regurgitation is not visualized. Aortic valve mean gradient measures 5.0 mmHg. Aortic valve peak gradient measures 7.6 mmHg. Aortic valve area, by VTI measures 1.94 cm. Pulmonic Valve: Pulmonic valve regurgitation is not visualized. Aorta: No significant ascending aortic aneurysm. Venous: IVC was not well visualized. IAS/Shunts: The interatrial septum was not well visualized.  LEFT VENTRICLE PLAX 2D LVIDd:         3.10 cm   Diastology LVIDs:         2.00 cm   LV e' medial:    4.57 cm/s LV PW:         1.20 cm   LV E/e' medial:  13.0 LV IVS:        1.90 cm   LV e' lateral:   5.87 cm/s LVOT diam:     1.80 cm   LV E/e' lateral: 10.2 LV SV:         51 LV  SV Index:   31 LVOT Area:     2.54 cm  RIGHT VENTRICLE RV Basal diam:  2.40 cm RV Mid diam:    2.10 cm RV S prime:     12.70 cm/s TAPSE (M-mode): 1.9 cm LEFT ATRIUM             Index        RIGHT ATRIUM           Index LA diam:        2.60 cm 1.61 cm/m   RA Area:     11.20 cm LA Vol (A2C):   25.8 ml 15.95 ml/m  RA Volume:   22.90 ml  14.16 ml/m LA Vol (A4C):   27.8 ml 17.19 ml/m LA Biplane Vol: 27.6 ml 17.06 ml/m  AORTIC VALVE                     PULMONIC VALVE AV Area (Vmax):    1.90 cm      PV Vmax:       0.78 m/s AV Area (Vmean):   1.93 cm      PV Peak grad:  2.5 mmHg AV Area (VTI):     1.94 cm AV Vmax:           138.00 cm/s AV Vmean:          106.000 cm/s AV VTI:            0.261 m AV Peak Grad:      7.6 mmHg AV Mean Grad:      5.0 mmHg LVOT Vmax:         103.00 cm/s LVOT Vmean:        80.300 cm/s LVOT VTI:           0.199 m LVOT/AV VTI ratio: 0.76  AORTA Ao Root diam: 2.60 cm Ao Asc diam:  3.10 cm MITRAL VALVE MV Area (PHT): 5.27 cm    SHUNTS MV Decel Time: 144 msec    Systemic VTI:  0.20 m MV E velocity: 59.60 cm/s  Systemic Diam: 1.80 cm MV A velocity: 87.00 cm/s MV E/A ratio:  0.69 Landscape architect signed by Phineas Inches Signature Date/Time: 02/05/2022/10:40:40 AM    Final    CT ANGIO HEAD NECK W WO CM  Result Date: 02/05/2022 CLINICAL DATA:  Stroke EXAM: CT ANGIOGRAPHY HEAD AND NECK TECHNIQUE: Multidetector CT imaging of the head and neck was performed using the standard protocol during bolus administration of intravenous contrast. Multiplanar CT image reconstructions and MIPs were obtained to evaluate the vascular anatomy. Carotid stenosis measurements (when applicable) are obtained utilizing NASCET criteria, using the distal internal carotid diameter as the denominator. RADIATION DOSE REDUCTION: This exam was performed according to the departmental dose-optimization program which includes automated exposure control, adjustment of the mA and/or kV according to patient size and/or use of iterative reconstruction technique. CONTRAST:  75m OMNIPAQUE IOHEXOL 350 MG/ML SOLN COMPARISON:  None Available. FINDINGS: CT HEAD FINDINGS Brain: There is no mass, hemorrhage or extra-axial collection. The size and configuration of the ventricles and extra-axial CSF spaces are normal. There is no acute or chronic infarction. The brain parenchyma is normal. Skull: The visualized skull base, calvarium and extracranial soft tissues are normal. Sinuses/Orbits: No fluid levels or advanced mucosal thickening of the visualized paranasal sinuses. No mastoid or middle ear effusion. The orbits are normal. CTA NECK FINDINGS SKELETON: There is no bony spinal canal stenosis. No lytic or blastic lesion. OTHER NECK:  Normal pharynx, larynx and major salivary glands. No cervical lymphadenopathy. Unremarkable thyroid gland. UPPER CHEST: No  pneumothorax or pleural effusion. No nodules or masses. AORTIC ARCH: There is calcific atherosclerosis of the aortic arch. There is no aneurysm, dissection or hemodynamically significant stenosis of the visualized portion of the aorta. Conventional 3 vessel aortic branching pattern. The visualized proximal subclavian arteries are widely patent. RIGHT CAROTID SYSTEM: No dissection, occlusion or aneurysm. Mild atherosclerotic calcification at the carotid bifurcation without hemodynamically significant stenosis. LEFT CAROTID SYSTEM: No dissection, occlusion or aneurysm. Mild atherosclerotic calcification at the carotid bifurcation without hemodynamically significant stenosis. VERTEBRAL ARTERIES: Left dominant configuration. Both origins are clearly patent. There is no dissection, occlusion or flow-limiting stenosis to the skull base (V1-V3 segments). CTA HEAD FINDINGS POSTERIOR CIRCULATION: --Vertebral arteries: Normal V4 segments. --Inferior cerebellar arteries: Normal. --Basilar artery: Normal. --Superior cerebellar arteries: Normal. --Posterior cerebral arteries (PCA): Normal. ANTERIOR CIRCULATION: --Intracranial internal carotid arteries: Atherosclerotic calcification of the internal carotid arteries at the skull base without hemodynamically significant stenosis. --Anterior cerebral arteries (ACA): Normal. Both A1 segments are present. Patent anterior communicating artery (a-comm). --Middle cerebral arteries (MCA): Normal. VENOUS SINUSES: As permitted by contrast timing, patent. ANATOMIC VARIANTS: None Review of the MIP images confirms the above findings. IMPRESSION: 1. No emergent large vessel occlusion or hemodynamically significant stenosis of the head or neck. 2. Mild bilateral carotid bifurcation atherosclerosis without hemodynamically significant stenosis. 3. Aortic Atherosclerosis (ICD10-I70.0). Electronically Signed   By: Ulyses Jarred M.D.   On: 02/05/2022 03:22   MR BRAIN WO CONTRAST  Result Date:  02/04/2022 CLINICAL DATA:  Left arm weakness EXAM: MRI HEAD WITHOUT CONTRAST TECHNIQUE: Multiplanar, multiecho pulse sequences of the brain and surrounding structures were obtained without intravenous contrast. COMPARISON:  None Available. FINDINGS: Brain: There are 2 small areas of acute ischemia within the left basal ganglia and centrum semiovale. Multiple chronic microhemorrhages in a predominantly central distribution. There is multifocal hyperintense T2-weighted signal within the white matter. Generalized volume loss. Multiple old small vessel infarcts of the deep gray nuclei, brainstem and cerebellum. Vascular: Major flow voids are preserved. Skull and upper cervical spine: Normal calvarium and skull base. Visualized upper cervical spine and soft tissues are normal. Sinuses/Orbits:No paranasal sinus fluid levels or advanced mucosal thickening. No mastoid or middle ear effusion. Normal orbits. IMPRESSION: 1. Two small areas of acute ischemia within the left basal ganglia and centrum semiovale. No acute hemorrhage or mass effect. 2. Multiple old small vessel infarcts and findings of chronic ischemic microangiopathy. 3. Numerous chronic microhemorrhages in the central distribution consistent with chronic hypertensive angiopathy. Electronically Signed   By: Ulyses Jarred M.D.   On: 02/04/2022 23:42   DG Chest 2 View  Result Date: 02/04/2022 CLINICAL DATA:  Chest pain EXAM: CHEST - 2 VIEW COMPARISON:  02/07/2021 FINDINGS: Right basilar atelectasis or scarring. Left lung clear. Heart is normal size. No effusions or acute bony abnormality. IMPRESSION: Right base atelectasis or scarring.  No active disease. Electronically Signed   By: Rolm Baptise M.D.   On: 02/04/2022 19:18   CT HEAD WO CONTRAST  Result Date: 02/04/2022 CLINICAL DATA:  TIA EXAM: CT HEAD WITHOUT CONTRAST TECHNIQUE: Contiguous axial images were obtained from the base of the skull through the vertex without intravenous contrast. RADIATION DOSE  REDUCTION: This exam was performed according to the departmental dose-optimization program which includes automated exposure control, adjustment of the mA and/or kV according to patient size and/or use of iterative reconstruction technique. COMPARISON:  08/17/2021 FINDINGS: Brain: No acute  intracranial findings are seen. There is interval resolution of small focus bleeding in the left frontal lobe adjacent to the posterior aspect of left sylvian fissure since 08/17/2021. In the current study, there are no signs of bleeding within the cranium. There is decreased density in the periventricular and subcortical white matter suggesting small-vessel disease. Small old lacunar infarcts are seen in the basal ganglia with no significant change. Vascular: Unremarkable. Skull: Unremarkable. Sinuses/Orbits: Unremarkable. Other: None. IMPRESSION: No acute intracranial findings are seen in the noncontrast CT brain. Atrophy. Small-vessel disease. Small old lacunar infarcts in the basal ganglia appear stable. Electronically Signed   By: Elmer Picker M.D.   On: 02/04/2022 14:30     Time coordinating discharge: Over 30 minutes    Dwyane Dee, MD  [facility] 02/06/2022, 1:42 PM

## 2022-02-06 NOTE — Progress Notes (Signed)
Discharge instructions discussed with patient. All questions answered. No IV present to remove, no telemetry present to discontinue. Patient belongings returned to patient. Patient wheeled off of the unit for transport home.   Gwendolyn Grant, RN

## 2022-02-06 NOTE — Progress Notes (Signed)
Attempted to do pt's neuro and physical assessment.  Pt stating " I want to go home", I'm ready to get out of here".    Able to listen to heart, lungs, bowel sounds only. Attempted to do neuro assessment but pt not wanting to participate, pt unhappy and becoming defensive. Encouraged pt to talk with MD when they round to discuss going home. Instructed pt to call for any needs.

## 2022-02-06 NOTE — Evaluation (Signed)
Speech Language Pathology Evaluation Patient Details Name: Roberta Calderon MRN: [id number] DOB: 07-19-1971 Today's Date: 02/06/2022 Time: 1047-     Problem List:  Patient Active Problem List   Diagnosis Date Noted   Acute CVA (cerebrovascular accident) ([place]) 02/05/2022   Cerebrovascular accident (CVA) of right basal ganglia ([place]) 08/14/2021   Hypertensive urgency 08/14/2021   Cocaine abuse ([place]) 46/65/9935   Metabolic syndrome 70/17/7939   Right sided weakness 08/14/2021   Alcohol abuse 08/14/2021   Prolonged QT interval 08/14/2021   ICH (intracerebral hemorrhage) ([place]) 02/07/2021   Dyslipidemia    Right thalamic infarction ([place]) 04/08/2020   Prediabetes    Polysubstance abuse ([place])    Hemiparesis affecting left side as late effect of stroke ([place])    Ischemic stroke ([place]) 04/06/2020   Hypertension 04/06/2020   Tobacco use disorder 04/06/2020   Past Medical History:  Past Medical History:  Diagnosis Date   Hypertension    Past Surgical History: History reviewed. No pertinent surgical history. HPI:  Pt is a 51 y/o female admitted secondary to L hand and foot numbness. 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 PMH includes HTN, CVA, and DM.   Assessment / Plan / Recommendation Clinical Impression  Pt participated in limited cognitive/language assessment. D/C home is pending; she was highly irritable and after several questions declined further participation. She answered basic yes/no questions. There were no obvious deficits in word-retrieval. She was disoriented to time "2003" and demonstrated impaired selective attention. Speech was mildly dysarthric.  If she is going to receive [facility] services, she would benefit from SLP eval in home setting.    SLP Assessment  SLP Recommendation/Assessment: Patient needs continued Speech [place] [facility] SLP Visit Diagnosis: Dysarthria and anarthria (R47.1)    Recommendations for follow up therapy are one component of  a multi-disciplinary discharge planning process, led by the attending physician.  Recommendations may be updated based on patient status, additional functional criteria and insurance authorization.    Follow Up Recommendations  Home health SLP    Assistance Recommended at Discharge   Frequent assistance                 SLP Evaluation Cognition  Overall Cognitive Status: No family/caregiver present to determine baseline cognitive functioning Arousal/Alertness: Awake/alert Orientation Level: Oriented to situation;Disoriented to time Attention: Selective Selective Attention: Impaired Awareness: Appears intact Behaviors: Poor frustration tolerance Safety/Judgment: Impaired       Comprehension  Auditory Comprehension Overall Auditory Comprehension:  (unable to assess) Reading Comprehension Reading Status: Not tested    Expression Expression Primary Mode of Expression: Verbal Verbal Expression Overall Verbal Expression: Appears within functional limits for tasks assessed Written Expression Dominant Hand: Right   Oral / Motor  Motor Speech Overall Motor Speech: Appears within functional limits for tasks assessed            Roberta Calderon 02/06/2022, 10:58 AM Roberta Bamberg L. Tivis Ringer, MA CCC/SLP Clinical Specialist - [place] Office number [phone]

## 2022-02-07 ENCOUNTER — Ambulatory Visit: Payer: Medicaid Other

## 2022-02-07 ENCOUNTER — Telehealth: Payer: Self-pay

## 2022-02-07 NOTE — Telephone Encounter (Signed)
Transition Care Management Unsuccessful Follow-up Telephone Call  Date of discharge and from where:  02/06/2022, [facility]  Attempts:  1st Attempt  Reason for unsuccessful TCM follow-up call:  Voice mail full on # [phone]. I called # [phone] and her mother answered and said she was at work and not with the patient.  She stated she would have her daughter call me back.   She has an appointment at [facility] with Juluis Mire, NP on 02/22/2022,

## 2022-02-08 ENCOUNTER — Telehealth (INDEPENDENT_AMBULATORY_CARE_PROVIDER_SITE_OTHER): Payer: Self-pay

## 2022-02-08 ENCOUNTER — Other Ambulatory Visit: Payer: Self-pay | Admitting: Pharmacist

## 2022-02-08 ENCOUNTER — Telehealth: Payer: Self-pay

## 2022-02-08 ENCOUNTER — Other Ambulatory Visit: Payer: Self-pay

## 2022-02-08 DIAGNOSIS — Z760 Encounter for issue of repeat prescription: Secondary | ICD-10-CM

## 2022-02-08 DIAGNOSIS — I10 Essential (primary) hypertension: Secondary | ICD-10-CM

## 2022-02-08 MED ORDER — HYDROCHLOROTHIAZIDE 25 MG PO TABS
25.0000 mg | ORAL_TABLET | Freq: Every day | ORAL | 3 refills | Status: DC
  Filled 2022-02-08: qty 90, 90d supply, fill #0

## 2022-02-08 MED ORDER — ATORVASTATIN CALCIUM 80 MG PO TABS
80.0000 mg | ORAL_TABLET | Freq: Every day | ORAL | 1 refills | Status: DC
  Filled 2022-02-08: qty 90, 90d supply, fill #0

## 2022-02-08 MED ORDER — LOSARTAN POTASSIUM 50 MG PO TABS
50.0000 mg | ORAL_TABLET | Freq: Every day | ORAL | 1 refills | Status: DC
  Filled 2022-02-08: qty 90, 90d supply, fill #0

## 2022-02-08 MED ORDER — CLOPIDOGREL BISULFATE 75 MG PO TABS
75.0000 mg | ORAL_TABLET | Freq: Every day | ORAL | 3 refills | Status: DC
  Filled 2022-02-08: qty 30, 30d supply, fill #0

## 2022-02-08 MED ORDER — AMLODIPINE BESYLATE 10 MG PO TABS
10.0000 mg | ORAL_TABLET | Freq: Every day | ORAL | 1 refills | Status: DC
  Filled 2022-02-08: qty 30, 30d supply, fill #0

## 2022-02-08 MED ORDER — METFORMIN HCL ER 500 MG PO TB24
500.0000 mg | ORAL_TABLET | Freq: Two times a day (BID) | ORAL | 3 refills | Status: DC
  Filled 2022-02-08: qty 60, 30d supply, fill #0

## 2022-02-08 NOTE — Telephone Encounter (Signed)
Transition Care Management Follow-up Telephone Call Date of discharge and from where: 02/06/2022, [facility] How have you been since you were released from the hospital? She stated that she is really weak.  Any questions or concerns? Yes - the cost of one of her medications as noted below.   She only has family planning Medicaid and is working with DSS case worker to apply for full Medicaid.   Items Reviewed: Did the pt receive and understand the discharge instructions provided? Yes  Medications obtained and verified?  She said she has all of her medications but needs to pick up refills at the pharmacy. She stated that one of the medications is $90 and she is not able to afford that. She did not know which medication it was.  I instructed her to find out what medication it is and to call this clinic back.  We can work with our clinical pharmacist and see if there is a patient assistance program available for that medications.  Other? No  Any new allergies since your discharge? No  Dietary orders reviewed? Yes Do you have support at home? Yes - her mother  Home Care and Equipment/Supplies: Were home health services ordered? no If so, what is the name of the agency? N/a  Has the agency set up a time to come to the patient's home? not applicable Were any new equipment or medical supplies ordered?  No What is the name of the medical supply agency? N/a Were you able to get the supplies/equipment? not applicable Do you have any questions related to the use of the equipment or supplies? No  She said she still needs a cane or walker. I explained to her that [employer] of [place] has donated canes and walkers that are free for individuals.  I provided her with the address and phone number for [employer] [facility] and she said she will try to get there.   Functional Questionnaire: (I = Independent and D = Dependent) ADLs: She explained that she is ambulating independently and performing  her ADLs independently but could use help. Her mother works all day and is not available to assist her.    Follow up appointments reviewed:  PCP Hospital f/u appt confirmed? Yes  Scheduled to see Juluis Mire, NOP- 02/22/2022 . [facility] f/u appt confirmed?  None scheduled at this time   Are transportation arrangements needed?  She has been using the bus. She also said that she was given an application for [employer] [place] but needs to complete it.  Instructed her to bring the application to the clinic when she completes her part (Part A) and the provider or myself can complete Part B.  If their condition worsens, is the pt aware to call PCP or go to the Emergency Dept.? Yes Was the patient provided with contact information for the PCP's office or ED? Yes Was to pt encouraged to call back with questions or concerns? Yes

## 2022-02-08 NOTE — Telephone Encounter (Signed)
Copied from [place] [id number]. Topic: General - Other >> Feb 08, 2022 11:31 AM Roberta Calderon wrote: Reason for CRM: The patient would like to speak with J. Brazeau when possible  The patient shares that their atorvastatin (LIPITOR) 80 MG tablet [[id number]]   Please contact further when available

## 2022-02-08 NOTE — Telephone Encounter (Signed)
I returned call to patient and she explained that the pharmacist told her it was the cholesterol medication that was $90.  I called [facility]/ [address] [place] and spoke to Albertson who said he spoke to patient.  Yes, the atorvastatin was $90 but with [facility] it would be $29.08 and he said he told her that. He also said that the total of all of her prescriptions would be $78.  I spoke to [employer], [place] who said that the prescriptions could be filled at [facility] for pick up today and the total cost for all of her medications might be around $60 at the most.  Some may be no charge.  She also could be eligible for a one time free fill if she has not used [employer] in the past. She also may be eligible for patient assistance programs.    I called patient and explained above noted conversations. She was very pleased and plans to pick up the medications today. I gave her the number for [facility] to call and confirm they are ready for pick up.   Per Acquanetta Belling Ausdall, [facility]  - patient is eligible for a one time free fill. Meds will be ready by this afternoon ~3:30-4p.  I called patient back and informed her of free fill and pick up time.

## 2022-02-09 ENCOUNTER — Ambulatory Visit: Payer: Medicaid Other

## 2022-02-09 ENCOUNTER — Ambulatory Visit: Payer: Medicaid Other | Admitting: Physical Therapy

## 2022-02-09 ENCOUNTER — Encounter: Payer: Self-pay | Admitting: Physical Therapy

## 2022-02-09 VITALS — BP 103/70 | HR 107

## 2022-02-09 DIAGNOSIS — R2689 Other abnormalities of gait and mobility: Secondary | ICD-10-CM | POA: Diagnosis not present

## 2022-02-09 DIAGNOSIS — M6281 Muscle weakness (generalized): Secondary | ICD-10-CM

## 2022-02-09 NOTE — Therapy (Signed)
OUTPATIENT PHYSICAL THERAPY TREATMENT NOTE   Patient Name: Roberta Calderon MRN: [id number] DOB:December 20, 1970, 51 y.o., female Today's Date: 02/09/2022  PCP: Juluis Mire, NP REFERRING PROVIDER: Juluis Mire, NP  END OF SESSION:   PT End of Session - 02/09/22 0940     Visit Number 3    Number of Visits 17    Date for PT Re-Evaluation 03/23/22    Authorization Type Self pay    Authorization Time Period Eval 01/26/22    PT Start Time 0933    PT Stop Time 1008    PT Time Calculation (min) 35 min    Equipment Utilized During Treatment Gait belt   RW   Activity Tolerance Treatment limited secondary to agitation    Behavior During Therapy WFL for tasks assessed/performed             Past Medical History:  Diagnosis Date   Hypertension    History reviewed. No pertinent surgical history. Patient Active Problem List   Diagnosis Date Noted   Acute CVA (cerebrovascular accident) ([place]) 02/05/2022   Cerebrovascular accident (CVA) of right basal ganglia ([place]) 08/14/2021   Hypertensive urgency 08/14/2021   Cocaine abuse ([place]) 73/56/7014   Metabolic syndrome 06/25/1313   Right sided weakness 08/14/2021   Alcohol abuse 08/14/2021   Prolonged QT interval 08/14/2021   ICH (intracerebral hemorrhage) ([place]) 02/07/2021   Dyslipidemia    Right thalamic infarction ([place]) 04/08/2020   Prediabetes    Polysubstance abuse ([place])    Hemiparesis affecting left side as late effect of stroke ([place])    Ischemic stroke ([place]) 04/06/2020   Hypertension 04/06/2020   Tobacco use disorder 04/06/2020    REFERRING DIAG: REFERRING DIAG: I63.81 (ICD-10-CM) - Cerebrovascular accident (CVA) of right basal ganglia ([place])   THERAPY DIAG:  Other abnormalities of gait and mobility  Muscle weakness (generalized)  Rationale for Evaluation and Treatment Rehabilitation  PERTINENT HISTORY: substance abuse, HTN, hx of strokes  PRECAUTIONS: fall  SUBJECTIVE: Patient discharged from hospital Monday  02/04/2022 with new referral to return to [place].  Pt reports having another stroke (acute CVA on MRI 02/04/2022- Two small areas of acute ischemia within the left basal ganglia and centrum semiovale.)  Pt states some ongoing dizziness following most recent stroke.  Pt reports her left grip strength has worsened.  PAIN:  Are you having pain? Yes: NPRS scale: 6/10 Pain location: left arm Pain description: sharp, intermittent Aggravating factors: laying down Relieving factors: getting up and walking around  [place] (RUE in sitting prior to session):   Today's Vitals   02/09/22 0937  BP: 103/70  Pulse: (!) 107   TODAY'S TREATMENT:  Reassessed goal baselines due to recent stroke.  [facility] PT Assessment - 02/09/22 0946       Berg Balance Test   Sit to Stand Able to stand  independently using hands    Standing Unsupported Able to stand 30 seconds unsupported    Sitting with Back Unsupported but Feet Supported on Floor or Stool Able to sit safely and securely 2 minutes    Stand to Sit Controls descent by using hands    Transfers Able to transfer safely, definite need of hands    Standing Unsupported with Eyes Closed Able to stand 10 seconds safely    Standing Unsupported with Feet Together Able to place feet together independently but unable to hold for 30 seconds   26 seconds before stepping into widened stance   From Standing, Reach Forward with Outstretched Arm Can reach  forward >5 cm safely (2")    From Standing Position, Pick up Object from [place] to pick up shoe, needs supervision    From Standing Position, Turn to Look Behind Over each Shoulder Turn sideways only but maintains balance    Turn 360 Degrees Able to turn 360 degrees safely but slowly    Standing Unsupported, Alternately Place Feet on Step/Stool Able to complete 4 steps without aid or supervision    Standing Unsupported, One Foot in Front Able to plae foot ahead of the other independently and hold 30 seconds     Standing on One Leg Unable to try or needs assist to prevent fall   lifts RLE ~1 sec, unable to lift RLE independently   Total Score 35    Berg comment: Significant fall risk      Functional Gait  Assessment   Gait assessed  Yes    Gait Level Surface Walks 20 ft, slow speed, abnormal gait pattern, evidence for imbalance or deviates 10-15 in outside of the 12 in walkway width. Requires more than 7 sec to ambulate 20 ft.    Change in Gait Speed Makes only minor adjustments to walking speed, or accomplishes a change in speed with significant gait deviations, deviates 10-15 in outside the 12 in walkway width, or changes speed but loses balance but is able to recover and continue walking.    Gait with Horizontal Head Turns Performs head turns with moderate changes in gait velocity, slows down, deviates 10-15 in outside 12 in walkway width but recovers, can continue to walk.    Gait with Vertical Head Turns Performs task with severe disruption of gait (eg, staggers 15 in outside 12 in walkway width, loses balance, stops, reaches for wall).    Gait and Pivot Turn Turns slowly, requires verbal cueing, or requires several small steps to catch balance following turn and stop    Step Over Obstacle Is able to step over one shoe box (4.5 in total height) but must slow down and adjust steps to clear box safely. May require verbal cueing.    Gait with Narrow Base of Support Ambulates less than 4 steps heel to toe or cannot perform without assistance.    Gait with Eyes Closed Cannot walk 20 ft without assistance, severe gait deviations or imbalance, deviates greater than 15 in outside 12 in walkway width or will not attempt task.    Ambulating Backwards Cannot walk 20 ft without assistance, severe gait deviations or imbalance, deviates greater than 15 in outside 12 in walkway width or will not attempt task.    Steps Two feet to a stair, must use rail.   alternating on ascent, step-to on descent   Total Score 6     FGA comment: 6/30            Pt is fatigued throughout BBS and frustrated with assessment components.  Assessed 5xSTS x2: -initial attempt w/ BUE support w/ pt not comfortable letting go of armrests limiting tall standing ROM 15.57 seconds -second attempt w/o UE support pt refused to attempt  PATIENT EDUCATION: Education details: Proper foot wear for PT as pt wearing slides to session today.  Pt does not have RW, though she was given resources/information on where and how to obtain one independently, but has not done so and is not using AD at home.  Reiterated safety using RW.  Pt reports 100% compliance to current HEP, could benefit from additions. Person educated: Patient Education method: Explanation Education  comprehension: verbalized understanding; requires further education /reinforcement     HOME EXERCISE PROGRAM: Access Code: [email or web address] URL: [email or web address] Date: 01/31/2022 Prepared by: Estevan Ryder  Exercises - Sit to Stand with Armchair  - 1 x daily - 7 x weekly - 3 sets - 10 reps - Standing March with Counter Support  - 1 x daily - 7 x weekly - 3 sets - 10 reps - Heel Raises with Counter Support  - 1 x daily - 7 x weekly - 3 sets - 10 reps    GOALS: Goals reviewed with patient? Yes   SHORT TERM GOALS: Target date: 02/23/2022   Pt will demo >50/56 on BBS to improve standing balance Baseline:47/56 (01/26/22); 35/56 (02/09/22) Goal status: INITIAL   2.  Pt will be consistent at using her RW at home and in community at least 75% of the time to improve safety and reduce risk of fall where she reports she is falling <2x/week with near falls or falls. Baseline: only using walker intermittently at night, experiences near falls/falss multiple times during the week; She is not using RW at home. Goal status: INITIAL   3.  Pt will report 50% compliance with HEP to improve independent management. Baseline: TBD; She states she does it everyday. Goal status:  INITIAL     LONG TERM GOALS: Target date: 03/23/2022   Pt will demo >18/30 on FGA to improve functional balance with ambulation and reduce fall risk Baseline: 11/30(01/26/22); 6/30 (02/09/2022) Goal status: INITIAL   2.  Pt will demo 5x sit to stand <30 sec to improve functional strength without use of UE support from standard chair Baseline: 49 sec without HHA; 15.57 seconds w/ BUE support w/o full stand (see notes) (02/09/22) Goal status: INITIAL   3.  Pt will report <2 falls/near falls/per month to improve safety awareness and reduce fall risk Baseline: multiple falls in a month (01/26/22) Goal status: INITIAL   4.  Pt will be able to ambulate on grass 200 feet to improve commnity access with LRAD to improve safety. Baseline: not tried. Goal status: INITIAL     ASSESSMENT:   CLINICAL IMPRESSION: Reassessed baselines for goals this session due to recent stroke affecting opposite hemibody with increased impairments noted particularly to activity tolerance.  Pt's BERG score declined to 35/56 and FGA score decline to 6/30.  She has yet to obtain RW due to waiting on resources just recently provided to her.  She endorses 100% compliance to current HEP.  Will continue to progress towards updated goals as medically able.     OBJECTIVE IMPAIRMENTS Abnormal gait, decreased activity tolerance, decreased balance, decreased endurance, decreased mobility, difficulty walking, decreased strength, decreased safety awareness, impaired flexibility, impaired UE functional use, improper body mechanics, postural dysfunction, and pain.    ACTIVITY LIMITATIONS carrying, lifting, bending, sitting, standing, squatting, stairs, and transfers   PARTICIPATION LIMITATIONS: meal prep, cleaning, laundry, shopping, and community activity   PERSONAL FACTORS Age, Past/current experiences, Time since onset of injury/illness/exacerbation, and 1-2 comorbidities: hx of multiple strokes, hx of substance abuse  are also  affecting patient's functional outcome.    REHAB POTENTIAL: Fair chronicity of strokes   CLINICAL DECISION MAKING: Stable/uncomplicated   EVALUATION COMPLEXITY: Low   PLAN: PT FREQUENCY: 2x/week   PT DURATION: 8 weeks   PLANNED INTERVENTIONS: Therapeutic exercises, Therapeutic activity, Neuromuscular re-education, Balance training, Gait training, Patient/Family education, Joint mobilization, Stair training, Orthotic/Fit training, Electrical stimulation, Spinal mobilization, Cryotherapy, Moist heat, Manual therapy, and Re-evaluation   PLAN FOR  NEXT SESSION: Is pt using walker consisitently at home, how's BP and rx compliance?, dynamic gait balance, endurance tx, additions to HEP for balance.  Bary Richard, PT, DPT   02/09/2022, 10:14 AM

## 2022-02-14 ENCOUNTER — Ambulatory Visit: Payer: Medicaid Other

## 2022-02-14 DIAGNOSIS — R2689 Other abnormalities of gait and mobility: Secondary | ICD-10-CM

## 2022-02-14 DIAGNOSIS — M6281 Muscle weakness (generalized): Secondary | ICD-10-CM

## 2022-02-14 NOTE — Therapy (Signed)
OUTPATIENT PHYSICAL THERAPY TREATMENT NOTE   Patient Name: Roberta Calderon MRN: [id number] DOB:February 07, 1971, 51 y.o., female Today's Date: 02/14/2022  PCP: Juluis Mire, NP REFERRING PROVIDER: Juluis Mire, NP  END OF SESSION:   PT End of Session - 02/14/22 0935     Visit Number 4    Number of Visits 17    Date for PT Re-Evaluation 03/23/22    Authorization Type Self pay    Authorization Time Period Eval 01/26/22    PT Start Time 0932    PT Stop Time 1011    PT Time Calculation (min) 39 min    Equipment Utilized During Treatment Gait belt    Activity Tolerance Treatment limited secondary to agitation    Behavior During Therapy WFL for tasks assessed/performed             Past Medical History:  Diagnosis Date   Hypertension    History reviewed. No pertinent surgical history. Patient Active Problem List   Diagnosis Date Noted   Acute CVA (cerebrovascular accident) ([place]) 02/05/2022   Cerebrovascular accident (CVA) of right basal ganglia ([place]) 08/14/2021   Hypertensive urgency 08/14/2021   Cocaine abuse ([place]) 38/25/0539   Metabolic syndrome 76/73/4193   Right sided weakness 08/14/2021   Alcohol abuse 08/14/2021   Prolonged QT interval 08/14/2021   ICH (intracerebral hemorrhage) ([place]) 02/07/2021   Dyslipidemia    Right thalamic infarction ([place]) 04/08/2020   Prediabetes    Polysubstance abuse ([place])    Hemiparesis affecting left side as late effect of stroke ([place])    Ischemic stroke ([place]) 04/06/2020   Hypertension 04/06/2020   Tobacco use disorder 04/06/2020    REFERRING DIAG: REFERRING DIAG: I63.81 (ICD-10-CM) - Cerebrovascular accident (CVA) of right basal ganglia (HCC)   THERAPY DIAG:  Other abnormalities of gait and mobility  Muscle weakness (generalized)  Rationale for Evaluation and Treatment Rehabilitation  PERTINENT HISTORY: substance abuse, HTN, hx of strokes  PRECAUTIONS: fall  SUBJECTIVE: Patient reports doing well. Denies falls/near  falls. Reports that she is doing exercises at home. Patient inquiring about OT eval (scheduled for next Wednesday).   PAIN:  Are you having pain? No  VITAL SIGNS (RUE in sitting prior to session): BP:137/73 BP after scifit: 138/82   TODAY'S TREATMENT:   Therex: - SciFit x11mns B UE/LE -calf raises at counter 2x20 -HSC at counter 2x20  Theract: -lateral stepping at counter, no UE support -tandem walking at counter, no UE support -blocked practice STS with R LE biased on 4" box (unable to continue with task as patient needed to urgently use bathroom)   -patient beginning again, tolerated 1x5 of L LE biased on 4" box before stating "I'm done" -ambulatory toe tapping with MinA, incr instability with L LE as standing leg   PATIENT EDUCATION: Education details: Proper foot wear for PT as pt wearing slides to session today.  Pt does not have RW, though she was given resources/information on where and how to obtain one independently, but has not done so and is not using AD at home.  Reiterated safety using RW.  Pt reports 100% compliance to current HEP, could benefit from additions. Person educated: Patient Education method: Explanation Education comprehension: verbalized understanding; requires further education /reinforcement     HOME EXERCISE PROGRAM: Access Code: [email or web address] URL: [email or web address] Date: 01/31/2022 Prepared by: JEstevan Ryder Exercises - Sit to Stand with Armchair  - 1 x daily - 7 x weekly - 3 sets - 10 reps - Standing March with Counter Support  -  1 x daily - 7 x weekly - 3 sets - 10 reps - Heel Raises with Counter Support  - 1 x daily - 7 x weekly - 3 sets - 10 reps    GOALS: Goals reviewed with patient? Yes   SHORT TERM GOALS: Target date: 02/23/2022   Pt will demo >50/56 on BBS to improve standing balance Baseline:47/56 (01/26/22); 35/56 (02/09/22) Goal status: INITIAL   2.  Pt will be consistent at using her RW at home and in community at  least 75% of the time to improve safety and reduce risk of fall where she reports she is falling <2x/week with near falls or falls. Baseline: only using walker intermittently at night, experiences near falls/falss multiple times during the week; She is not using RW at home. Goal status: INITIAL   3.  Pt will report 50% compliance with HEP to improve independent management. Baseline: TBD; She states she does it everyday. Goal status: INITIAL     LONG TERM GOALS: Target date: 03/23/2022   Pt will demo >18/30 on FGA to improve functional balance with ambulation and reduce fall risk Baseline: 11/30(01/26/22); 6/30 (02/09/2022) Goal status: INITIAL   2.  Pt will demo 5x sit to stand <30 sec to improve functional strength without use of UE support from standard chair Baseline: 49 sec without HHA; 15.57 seconds w/ BUE support w/o full stand (see notes) (02/09/22) Goal status: INITIAL   3.  Pt will report <2 falls/near falls/per month to improve safety awareness and reduce fall risk Baseline: multiple falls in a month (01/26/22) Goal status: INITIAL   4.  Pt will be able to ambulate on grass 200 feet to improve commnity access with LRAD to improve safety. Baseline: not tried. Goal status: INITIAL     ASSESSMENT:   CLINICAL IMPRESSION: Patient seen for skilled PT session with emphasis on endurance, functional strength and balance re-training. Patient reports 100% compliance to both HEP and BP Rx. BP has been much more stable and appropriate lately. She has not purchased a RW yet, but denies any falls or LOBs at home. She demonstrates limited functional strength in B LE. Patient with increased instability when standing on L LE. Continue POC.      OBJECTIVE IMPAIRMENTS Abnormal gait, decreased activity tolerance, decreased balance, decreased endurance, decreased mobility, difficulty walking, decreased strength, decreased safety awareness, impaired flexibility, impaired UE functional use, improper  body mechanics, postural dysfunction, and pain.    ACTIVITY LIMITATIONS carrying, lifting, bending, sitting, standing, squatting, stairs, and transfers   PARTICIPATION LIMITATIONS: meal prep, cleaning, laundry, shopping, and community activity   PERSONAL FACTORS Age, Past/current experiences, Time since onset of injury/illness/exacerbation, and 1-2 comorbidities: hx of multiple strokes, hx of substance abuse  are also affecting patient's functional outcome.    REHAB POTENTIAL: Fair chronicity of strokes   CLINICAL DECISION MAKING: Stable/uncomplicated   EVALUATION COMPLEXITY: Low   PLAN: PT FREQUENCY: 2x/week   PT DURATION: 8 weeks   PLANNED INTERVENTIONS: Therapeutic exercises, Therapeutic activity, Neuromuscular re-education, Balance training, Gait training, Patient/Family education, Joint mobilization, Stair training, Orthotic/Fit training, Electrical stimulation, Spinal mobilization, Cryotherapy, Moist heat, Manual therapy, and Re-evaluation   PLAN FOR NEXT SESSION: walker/HEP/Rx compliance?, dynamic gait balance, endurance tx, additions to HEP for balance, functional LE strength  Debbora Dus, PT, DPT Debbora Dus, PT, DPT, CBIS 02/14/2022, 10:14 AM

## 2022-02-16 ENCOUNTER — Ambulatory Visit: Payer: Medicaid Other

## 2022-02-16 DIAGNOSIS — R2689 Other abnormalities of gait and mobility: Secondary | ICD-10-CM | POA: Diagnosis not present

## 2022-02-16 DIAGNOSIS — M6281 Muscle weakness (generalized): Secondary | ICD-10-CM

## 2022-02-21 ENCOUNTER — Ambulatory Visit: Payer: Medicaid Other | Admitting: Physical Therapy

## 2022-02-21 ENCOUNTER — Ambulatory Visit: Payer: Medicaid Other | Admitting: Occupational Therapy

## 2022-02-21 ENCOUNTER — Ambulatory Visit: Payer: Medicaid Other

## 2022-02-21 ENCOUNTER — Encounter: Payer: Self-pay | Admitting: Occupational Therapy

## 2022-02-21 DIAGNOSIS — M6281 Muscle weakness (generalized): Secondary | ICD-10-CM

## 2022-02-21 DIAGNOSIS — R41842 Visuospatial deficit: Secondary | ICD-10-CM

## 2022-02-21 DIAGNOSIS — R2689 Other abnormalities of gait and mobility: Secondary | ICD-10-CM | POA: Diagnosis not present

## 2022-02-21 DIAGNOSIS — M79642 Pain in left hand: Secondary | ICD-10-CM

## 2022-02-21 DIAGNOSIS — R278 Other lack of coordination: Secondary | ICD-10-CM

## 2022-02-21 DIAGNOSIS — R41844 Frontal lobe and executive function deficit: Secondary | ICD-10-CM

## 2022-02-21 DIAGNOSIS — R41840 Attention and concentration deficit: Secondary | ICD-10-CM

## 2022-02-21 NOTE — Therapy (Signed)
OUTPATIENT OCCUPATIONAL THERAPY NEURO EVALUATION  Patient Name: Roberta Calderon MRN: [id number] DOB:August 14, 1971, 51 y.o., female Today's Date: 02/21/2022  PCP: Kerin Perna, NP REFERRING PROVIDER: Dwyane Dee, MD    OT End of Session - 02/21/22 [phone]     Visit Number 1    Number of Visits 5    Date for OT Re-Evaluation 03/21/22    Authorization Type Self Pay    OT Start Time 0930    OT Stop Time 1000   pt left during evaluation   OT Time Calculation (min) 30 min    Activity Tolerance Patient tolerated treatment well    Behavior During Therapy Agitated             Past Medical History:  Diagnosis Date   Hypertension    History reviewed. No pertinent surgical history. Patient Active Problem List   Diagnosis Date Noted   Acute CVA (cerebrovascular accident) ([place]) 02/05/2022   Cerebrovascular accident (CVA) of right basal ganglia ([place]) 08/14/2021   Hypertensive urgency 08/14/2021   Cocaine abuse ([place]) 64/15/8309   Metabolic syndrome 40/76/8088   Right sided weakness 08/14/2021   Alcohol abuse 08/14/2021   Prolonged QT interval 08/14/2021   ICH (intracerebral hemorrhage) ([place]) 02/07/2021   Dyslipidemia    Right thalamic infarction ([place]) 04/08/2020   Prediabetes    Polysubstance abuse ([place])    Hemiparesis affecting left side as late effect of stroke ([place])    Ischemic stroke ([place]) 04/06/2020   Hypertension 04/06/2020   Tobacco use disorder 04/06/2020     Rationale for Evaluation and Treatment Rehabilitation    ONSET DATE: 02/06/2022   REFERRING DIAG: I63.9 (ICD-10-CM) - Infarction of left basal ganglia (HCC)   THERAPY DIAG:  Muscle weakness (generalized)  Other lack of coordination  Attention and concentration deficit  Visuospatial deficit  Frontal lobe and executive function deficit  Pain in left hand  SUBJECTIVE:   SUBJECTIVE STATEMENT: " My left hand locks up on me and I have really bad pain in it"  Pt accompanied by:  self  PERTINENT HISTORY: Pt is a 51 y/o female admitted secondary to L hand and foot numbness. 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 PMH includes HTN, CVA, and DM.   PRECAUTIONS: Fall  WEIGHT BEARING RESTRICTIONS No  PAIN:  Are you having pain? Yes: NPRS scale: 8/10 Pain location: LUE hand Pain description: cramping Aggravating factors: N/A Relieving factors: N/A  FALLS: Has patient fallen in last 6 months? Yes. Number of falls 2  LIVING ENVIRONMENT: Lives with: lives with their family - mother, puppy Lives in: House/apartment Stairs: No - 2steps to enter, none inside. Tub/Shower Combo Has following equipment at home: None  PLOF: Vocation/Vocational requirements: [employer] and [employer] Full Time, Leisure: walking for fun, prior enjoyed driving, and Independent  PATIENT GOALS "get better" "get my hand better"  OBJECTIVE:     HAND DOMINANCE: Right  ADLs: Overall ADLs: Needs Assistance Eating: Needs help cutting up food Grooming: Mod I UB Dressing: Mod I LB Dressing: Mod I Toileting: Mod I Bathing: Total A - mom completing all Tub Shower transfers: Min A Equipment: none   IADLs: Shopping: using scooter - accompanied by mom Light housekeeping: assisting some with laundry - folding clothes Meal Prep: not currently cooking, currently obtaining snacks/drinks Community mobility: Relies on transportation Medication management: Independent Financial management: Independent Handwriting:  N/A  MOBILITY STATUS:  amublating without AD, fall risk  POSTURE COMMENTS:  No Significant postural limitations  Sitting  balance:  N/A  ACTIVITY TOLERANCE: Activity tolerance: increased fatigue, decreased activity tolerance  FUNCTIONAL OUTCOME MEASURES: FOTO: did not complete at front desk at check in  UE ROM     Active ROM Right 12/25/2021 Left 12/25/2021  Shoulder flexion WFL with LUE slightly limited with shoulder flexion.  Shoulder abduction   Shoulder  adduction   Shoulder extension   Shoulder internal rotation   Shoulder external rotation   Elbow flexion   Elbow extension   Wrist flexion   Wrist extension   Wrist ulnar deviation   Wrist radial deviation   Wrist pronation   Wrist supination   (Blank rows = not tested)   UE MMT:     MMT Right 12/25/2021 Left 12/25/2021  Shoulder flexion Grossly 5/5 Grossly 4/5  Shoulder abduction    Shoulder adduction    Shoulder extension    Shoulder internal rotation    Shoulder external rotation    Middle trapezius    Lower trapezius    Elbow flexion    Elbow extension    Wrist flexion    Wrist extension    Wrist ulnar deviation    Wrist radial deviation    Wrist pronation    Wrist supination    (Blank rows = not tested)  HAND FUNCTION: Grip strength: Right: 59.5 lbs; Left: 24.6 lbs  COORDINATION: Finger Nose Finger test: WFL, L slower than R 9 Hole Peg test: Right: 33.1 sec; Left: 36.01 sec Box and Blocks:  Right 47 blocks, Left 42 blocks   SENSATION: WFL  EDEMA: N/A  MUSCLE TONE: LUE: unable to test d/t patient leaving d/t frustration and not finishing evaluation  COGNITION: Overall cognitive status:  unable to test d/t patient leaving d/t frustration and not finishing evaluation Pt finished Trail Making A with 100% accuracy and good time however became frustrated when given Trail Making B and walked out of evaluation.  VISION: Subjective report: Pt reports wearing reading glasses and not having them available today - which is why Trail Making tests were difficult. Baseline vision: Wears glasses for reading only per report Visual history:  unable to test d/t patient leaving d/t frustration and not finishing evaluation  VISION ASSESSMENT: unable to test d/t patient leaving d/t frustration and not finishing evaluation  PERCEPTION: Not tested unable to test d/t patient leaving d/t frustration and not finishing evaluation  PRAXIS: Not tested unable to test d/t patient  leaving d/t frustration and not finishing evaluation  OBSERVATIONS: Pt with frustration with therapists's questions and evaluation and left evaluation without finishing.  TODAY'S TREATMENT:  02/21/22  None today - evaluation only.   PATIENT EDUCATION: Education details: Education on role and purpose of OT  Person educated: Patient Education method: Explanation Education comprehension: verbalized understanding and needs further education   HOME EXERCISE PROGRAM: 02/21/22  To Be Established    GOALS: Goals reviewed with patient? No    LONG TERM GOALS: Target date: 03/21/2022    Status:  1 Pt will be independent with HEP targeting LUE coordination and grip strength.  Baseline: not given at evaluation Initial  2 Pt will improve grip strength by at least 5 lbs with LUE for increasing functional use in non dominant hand  Baseline: Right: 59.5 lbs; Left: 24.6 lbs Initial  3 Pt will increase fine motor coordination in LUE by improving 9 hole peg test by at least 3 seconds   Baseline: Right: 33.1 sec; Left: 36.01 sec Initial  4 Pt will perform bathing with min A.  Baseline: Total A  Initial  5 If patient returns, assess skills and write additional goals PRN. Baseline: pt left evaluation in frustration and unable to finish evaluation and determine all goals.  Initial    ASSESSMENT:  CLINICAL IMPRESSION: Patient is a 51 y.o. female who was seen today for occupational therapy evaluation for Infarction of left basal ganglia. Pt presents with deficits with LUE decreased coordination, grip strength and unsteadiness on feet. Pt also with cognitive deficits and poor frustration tolerance. Skilled occupational therapy is recommended to target listed areas of deficit and increase independence with ADLs and IADLs and decrease caregiver burden.     PERFORMANCE DEFICITS in functional skills including ADLs, IADLs, coordination, dexterity, ROM, strength, pain, FMC, GMC, balance, decreased  knowledge of use of DME, vision, and UE functional use, cognitive skills including attention, energy/drive, problem solving, safety awareness, thought, and understand, and psychosocial skills including coping strategies, interpersonal interactions, and routines and behaviors.   IMPAIRMENTS are limiting patient from ADLs, IADLs, and leisure.   COMORBIDITIES may have co-morbidities  that affects occupational performance. Patient will benefit from skilled OT to address above impairments and improve overall function.  MODIFICATION OR ASSISTANCE TO COMPLETE EVALUATION: Min-Moderate modification of tasks or assist with assess necessary to complete an evaluation.  OT OCCUPATIONAL PROFILE AND HISTORY: Detailed assessment: Review of records and additional review of physical, cognitive, psychosocial history related to current functional performance.  CLINICAL DECISION MAKING: Moderate - several treatment options, min-mod task modification necessary  REHAB POTENTIAL: Fair d/t patient walking out of evaluation d/t frustration  EVALUATION COMPLEXITY: Moderate    PLAN: OT FREQUENCY: 2x/week  OT DURATION: 4 weeks  PLANNED INTERVENTIONS: self care/ADL training, therapeutic exercise, therapeutic activity, neuromuscular re-education, manual therapy, functional mobility training, splinting, ultrasound, paraffin, fluidotherapy, patient/family education, cognitive remediation/compensation, visual/perceptual remediation/compensation, energy conservation, coping strategies training, and DME and/or AE instructions  RECOMMENDED OTHER SERVICES: currently PT plan of care, maybe could benefit from ST evaluation  CONSULTED AND AGREED WITH PLAN OF CARE: Patient  PLAN FOR NEXT SESSION: fluido for LUE, coordination and theraputty [place], OT 02/21/2022, 10:26 AM

## 2022-02-22 ENCOUNTER — Other Ambulatory Visit: Payer: Self-pay

## 2022-02-22 ENCOUNTER — Ambulatory Visit (INDEPENDENT_AMBULATORY_CARE_PROVIDER_SITE_OTHER): Payer: Medicaid Other | Admitting: Primary Care

## 2022-02-22 ENCOUNTER — Encounter (INDEPENDENT_AMBULATORY_CARE_PROVIDER_SITE_OTHER): Payer: Self-pay | Admitting: Primary Care

## 2022-02-22 VITALS — BP 131/81 | HR 94 | Temp 98.0°F | Ht 62.0 in | Wt 132.2 lb

## 2022-02-22 DIAGNOSIS — I10 Essential (primary) hypertension: Secondary | ICD-10-CM | POA: Diagnosis not present

## 2022-02-22 DIAGNOSIS — E119 Type 2 diabetes mellitus without complications: Secondary | ICD-10-CM

## 2022-02-22 DIAGNOSIS — G4700 Insomnia, unspecified: Secondary | ICD-10-CM

## 2022-02-22 DIAGNOSIS — M79674 Pain in right toe(s): Secondary | ICD-10-CM

## 2022-02-22 MED ORDER — EMPAGLIFLOZIN 10 MG PO TABS
10.0000 mg | ORAL_TABLET | Freq: Every day | ORAL | 1 refills | Status: DC
  Filled 2022-02-22: qty 90, 90d supply, fill #0

## 2022-02-22 MED ORDER — TRAZODONE HCL 50 MG PO TABS
25.0000 mg | ORAL_TABLET | Freq: Every evening | ORAL | 1 refills | Status: DC | PRN
  Filled 2022-02-22: qty 60, 60d supply, fill #0

## 2022-02-22 NOTE — Addendum Note (Signed)
Addended by: Juluis Mire on: 02/22/2022 01:58 PM   Modules accepted: Orders

## 2022-02-22 NOTE — Progress Notes (Addendum)
[facility]  Aniylah Calderon, is a 51 y.o. female  RWE:[id number]  YPP:[id number]  DOB - 05-22-1971  Chief Complaint  Patient presents with   Hypertension       Subjective:   Roberta Calderon is a 51 y.o. female here today for a acute  visit. States she has the "run" (diarrhea ) after taking metformin. DM controlled. Patient has No headache, No chest pain, No abdominal pain - No Nausea, No new weakness tingling or numbness, No Cough - shortness of breath  No problems updated.  Allergies  Allergen Reactions   Nsaids Nausea And Vomiting and Rash   Aspirin Hives    Past Medical History:  Diagnosis Date   Hypertension     Current Outpatient Medications on File Prior to Visit  Medication Sig Dispense Refill   amLODipine (NORVASC) 10 MG tablet Take 1 tablet (10 mg total) by mouth once daily. 90 tablet 1   atorvastatin (LIPITOR) 80 MG tablet Take 1 tablet (80 mg total) by mouth once daily. 90 tablet 1   clopidogrel (PLAVIX) 75 MG tablet Take 1 tablet (75 mg total) by mouth once daily. 30 tablet 3   hydrochlorothiazide (HYDRODIURIL) 25 MG tablet Take 1 tablet (25 mg total) by mouth once daily. 90 tablet 3   losartan (COZAAR) 50 MG tablet Take 1 tablet (50 mg total) by mouth once daily. 90 tablet 1   Multiple Vitamins-Minerals (CERTAVITE/ANTIOXIDANTS) TABS Take 1 tablet by mouth daily. (Patient not taking: Reported on 02/05/2022) 30 tablet 0   No current facility-administered medications on file prior to visit.    Objective:   Vitals:   02/22/22 1334  BP: 131/81  Pulse: 94  Temp: 98 F (36.7 C)  TempSrc: Oral  SpO2: 97%  Weight: 132 lb 3.2 oz (60 kg)  Height: 5' 2"  (1.575 m)    Exam General appearance : Awake, alert, not in any distress. Speech Clear. Not toxic looking HEENT: Atraumatic and Normocephalic, pupils equally reactive to light and accomodation Neck: Supple, no JVD. No cervical lymphadenopathy.  Chest: Good air entry bilaterally, no added  sounds  CVS: S1 S2 regular, no murmurs.  Abdomen: Bowel sounds present, Non tender and not distended with no gaurding, rigidity or rebound. Extremities: B/L Lower Ext shows no edema, both legs are warm to touch Neurology: Awake alert, and oriented X 3, Non focal Skin: No Rash  Data Review Lab Results  Component Value Date   HGBA1C 6.7 (A) 02/01/2022   HGBA1C 6.6 (H) 08/15/2021   HGBA1C 6.3 (H) 02/06/2021    Assessment & Plan  [name] was seen today for hypertension.  Diagnoses and all orders for this visit:  Type 2 diabetes mellitus without complication, without long-term current use of insulin ([place])  Patient has been having GI problems with met formin. Last A1C 6.7. Change to Jardiance 34m. Continue to monitor foods that are high in carbohydrates are the following rice, potatoes, breads, sugars, and pastas.  Reduction in the intake (eating) will assist in lowering your blood sugars.   Hypertension, unspecified type Counseled on blood pressure goal of less than 130/80, very close to goal. low-sodium, DASH diet, medication compliance, 150 minutes of moderate intensity exercise per week. Discussed medication compliance, adverse effects.   Insomnia, unspecified type -     traZODone (DESYREL) 50 MG tablet; Take 0.5-1 tablets (25-50 mg total) by mouth at bedtime as needed for sleep.  Other orders -     empagliflozin (JARDIANCE) 10 MG TABS tablet; Take 1 tablet (  10 mg total) by mouth daily before breakfast.   Pain of toe of right foot Right foot 2nd and 3 toe thick toenails unable to determine if fungus present toenail polish on them.  Patient have been counseled extensively about nutrition and exercise. Other issues discussed during this visit include: low cholesterol diet, weight control and daily exercise, foot care, annual eye examinations at Ophthalmology, importance of adherence with medications and regular follow-up. We also discussed long term complications of uncontrolled  diabetes and hypertension.    The patient was given clear instructions to go to ER or return to medical center if symptoms don't improve, worsen or new problems develop. The patient verbalized understanding. The patient was told to call to get lab results if they haven't heard anything in the next week.   This note has been created with Surveyor, quantity. Any transcriptional errors are unintentional.   Kerin Perna, NP 02/22/2022, 1:53 PM

## 2022-02-22 NOTE — Patient Instructions (Signed)
Empagliflozin Tablets What is this medication? EMPAGLIFLOZIN (EM pa gli FLOE zin) treats type 2 diabetes. It works by helping your kidneys remove sugar (glucose) from your blood through the urine, which decreases your blood sugar. It can also be used to lower the risk of heart attack, stroke, and hospitalization for heart failure in people with type 2 diabetes. Changes to diet and exercise are often combined with this medication. This medicine may be used for other purposes; ask your health care provider or pharmacist if you have questions. COMMON BRAND NAME(S): Jardiance What should I tell my care team before I take this medication? They need to know if you have any of these conditions: Dehydration Diabetic ketoacidosis Diet low in salt Eating less due to illness, surgery, dieting, or any other reason Frequently drink alcohol Having surgery High cholesterol High levels of potassium in the blood History of pancreatitis or pancreas problems History of yeast infection of the penis or vagina Infections in the bladder, kidneys, or urinary tract Kidney disease Liver disease Low blood pressure On hemodialysis Problems urinating Type 1 diabetes Uncircumcised female An unusual or allergic reaction to empagliflozin, other medications, foods, dyes, or preservatives Pregnant or trying to get pregnant Breast-feeding How should I use this medication? Take this medication by mouth with water. Take it as directed on the prescription label at the same time every day. You may take it with or without food. Keep taking it unless your care team tells you to stop. A special MedGuide will be given to you by the pharmacist with each prescription and refill. Be sure to read this information carefully each time. Talk to your care team about the use of this medication in children. Special care may be needed. Overdosage: If you think you have taken too much of this medicine contact a poison control center or  emergency room at once. NOTE: This medicine is only for you. Do not share this medicine with others. What if I miss a dose? If you miss a dose, take it as soon as you can. If it is almost time for your next dose, take only that dose. Do not take double or extra doses. What may interact with this medication? Alcohol Diuretics Insulin Lithium This list may not describe all possible interactions. Give your health care provider a list of all the medicines, herbs, non-prescription drugs, or dietary supplements you use. Also tell them if you smoke, drink alcohol, or use illegal drugs. Some items may interact with your medicine. What should I watch for while using this medication? Visit your care team for regular checks on your progress. Tell your care team if your symptoms do not start to get better or if they get worse. This medication can cause a serious condition in which there is too much acid in the blood. If you develop nausea, vomiting, stomach pain, unusual tiredness, or breathing problems, stop taking this medication and call your care team right away. If possible, use a ketone dipstick to check for ketones in your urine. Check with your care team if you have severe diarrhea, nausea, and vomiting, or if you sweat a lot. The loss of too much body fluid may make it dangerous for you to take this medication. A test called the HbA1C (A1C) will be monitored. This is a simple blood test. It measures your blood sugar control over the last 2 to 3 months. You will receive this test every 3 to 6 months. Learn how to check your blood sugar. Learn  the symptoms of low and high blood sugar and how to manage them. Always carry a quick-source of sugar with you in case you have symptoms of low blood sugar. Examples include hard sugar candy or glucose tablets. Make sure others know that you can choke if you eat or drink when you develop serious symptoms of low blood sugar, such as seizures or unconsciousness. Get  medical help at once. Tell your care team if you have high blood sugar. You might need to change the dose of your medication. If you are sick or exercising more than usual, you may need to change the dose of your medication. What side effects may I notice from receiving this medication? Side effects that you should report to your care team as soon as possible: Allergic reactions--skin rash, itching, hives, swelling of the face, lips, tongue, or throat Dehydration--increased thirst, dry mouth, feeling faint or lightheaded, headache, dark yellow or brown urine Diabetic ketoacidosis (DKA)--increased thirst or amount of urine, dry mouth, fatigue, fruity odor to breath, trouble breathing, stomach pain, nausea, vomiting Genital yeast infection--redness, swelling, pain, or itchiness, odor, thick or lumpy discharge New pain or tenderness, change in skin color, sores or ulcers, infection of the leg or foot Infection or redness, swelling, tenderness, or pain in the genitals, or area from the genitals to the back of the rectum Urinary tract infection (UTI)--burning when passing urine, passing frequent small amounts of urine, bloody or cloudy urine, pain in the lower back or sides This list may not describe all possible side effects. Call your doctor for medical advice about side effects. You may report side effects to FDA at 1-800-FDA-1088. Where should I keep my medication? Keep out of the reach of children and pets. Store at room temperature between 20 and 25 degrees C (68 and 77 degrees F). Get rid of any unused medication after the expiration date. To get rid of medications that are no longer needed or have expired: Take the medication to a medication take-back program. Check with your pharmacy or law enforcement to find a location. If you cannot return the medication, check the label or package insert to see if the medication should be thrown out in the garbage or flushed down the toilet. If you are not  sure, ask your care team. If it is safe to put it in the trash, take the medication out of the container. Mix the medication with cat litter, dirt, coffee grounds, or other unwanted substance. Seal the mixture in a bag or container. Put it in the trash. NOTE: This sheet is a summary. It may not cover all possible information. If you have questions about this medicine, talk to your doctor, pharmacist, or health care provider.  2023 Elsevier/Gold Standard (2021-06-15 00:00:00)

## 2022-02-23 ENCOUNTER — Ambulatory Visit: Payer: Medicaid Other

## 2022-02-28 ENCOUNTER — Ambulatory Visit: Payer: Self-pay | Attending: Primary Care

## 2022-02-28 ENCOUNTER — Ambulatory Visit: Payer: Self-pay | Admitting: Occupational Therapy

## 2022-03-01 ENCOUNTER — Other Ambulatory Visit: Payer: Self-pay

## 2022-03-02 ENCOUNTER — Ambulatory Visit: Payer: Self-pay

## 2022-03-02 ENCOUNTER — Telehealth: Payer: Self-pay

## 2022-03-02 NOTE — Telephone Encounter (Signed)
Patient Name: Roberta Calderon MRN: [id number] DOB:1970-11-01, 51 y.o., female Today's Date: 03/02/2022  Called patient as she missed her PT appt at 9:30am this morning. Pt reported that she was not feeling well that is why she missed her appt. Today was her last scheduled her appt. Pt's appt was rescheduled for next week.   Kerrie Pleasure, PT 03/02/2022, 12:21 PM

## 2022-03-05 ENCOUNTER — Ambulatory Visit: Payer: Self-pay | Admitting: Physical Therapy

## 2022-03-07 ENCOUNTER — Encounter: Payer: Medicaid Other | Admitting: Occupational Therapy

## 2022-03-07 ENCOUNTER — Ambulatory Visit: Payer: Medicaid Other

## 2022-03-09 ENCOUNTER — Encounter: Payer: Medicaid Other | Admitting: Occupational Therapy

## 2022-03-09 ENCOUNTER — Ambulatory Visit: Payer: Medicaid Other

## 2022-03-14 ENCOUNTER — Ambulatory Visit: Payer: Medicaid Other

## 2022-03-14 ENCOUNTER — Encounter: Payer: Medicaid Other | Admitting: Occupational Therapy

## 2022-03-16 ENCOUNTER — Ambulatory Visit: Payer: Medicaid Other

## 2022-03-16 ENCOUNTER — Encounter: Payer: Medicaid Other | Admitting: Occupational Therapy

## 2022-03-21 ENCOUNTER — Encounter: Payer: Medicaid Other | Admitting: Occupational Therapy

## 2022-03-21 ENCOUNTER — Ambulatory Visit: Payer: Medicaid Other

## 2022-03-23 ENCOUNTER — Ambulatory Visit: Payer: Medicaid Other

## 2022-04-10 ENCOUNTER — Inpatient Hospital Stay (HOSPITAL_COMMUNITY): Payer: Medicaid Other

## 2022-04-10 ENCOUNTER — Emergency Department (HOSPITAL_COMMUNITY): Payer: Medicaid Other

## 2022-04-10 ENCOUNTER — Other Ambulatory Visit: Payer: Self-pay

## 2022-04-10 ENCOUNTER — Inpatient Hospital Stay (HOSPITAL_COMMUNITY)
Admission: EM | Admit: 2022-04-10 | Discharge: 2022-04-12 | DRG: 065 | Disposition: A | Discharge status: Home / Self Care | Payer: Medicaid Other | Attending: Internal Medicine | Admitting: Internal Medicine

## 2022-04-10 ENCOUNTER — Encounter (HOSPITAL_COMMUNITY): Payer: Self-pay

## 2022-04-10 DIAGNOSIS — F17210 Nicotine dependence, cigarettes, uncomplicated: Secondary | ICD-10-CM | POA: Diagnosis present

## 2022-04-10 DIAGNOSIS — I63033 Cerebral infarction due to thrombosis of bilateral carotid arteries: Secondary | ICD-10-CM

## 2022-04-10 DIAGNOSIS — R4701 Aphasia: Secondary | ICD-10-CM

## 2022-04-10 DIAGNOSIS — R471 Dysarthria and anarthria: Secondary | ICD-10-CM | POA: Diagnosis present

## 2022-04-10 DIAGNOSIS — Z79899 Other long term (current) drug therapy: Secondary | ICD-10-CM

## 2022-04-10 DIAGNOSIS — R29810 Facial weakness: Secondary | ICD-10-CM | POA: Diagnosis present

## 2022-04-10 DIAGNOSIS — I10 Essential (primary) hypertension: Secondary | ICD-10-CM | POA: Diagnosis present

## 2022-04-10 DIAGNOSIS — E1165 Type 2 diabetes mellitus with hyperglycemia: Secondary | ICD-10-CM | POA: Diagnosis present

## 2022-04-10 DIAGNOSIS — Z8673 Personal history of transient ischemic attack (TIA), and cerebral infarction without residual deficits: Secondary | ICD-10-CM | POA: Diagnosis not present

## 2022-04-10 DIAGNOSIS — E785 Hyperlipidemia, unspecified: Secondary | ICD-10-CM | POA: Diagnosis present

## 2022-04-10 DIAGNOSIS — R4586 Emotional lability: Secondary | ICD-10-CM | POA: Diagnosis present

## 2022-04-10 DIAGNOSIS — I639 Cerebral infarction, unspecified: Secondary | ICD-10-CM

## 2022-04-10 DIAGNOSIS — Z886 Allergy status to analgesic agent status: Secondary | ICD-10-CM

## 2022-04-10 DIAGNOSIS — Z888 Allergy status to other drugs, medicaments and biological substances status: Secondary | ICD-10-CM | POA: Diagnosis not present

## 2022-04-10 DIAGNOSIS — R29706 NIHSS score 6: Secondary | ICD-10-CM | POA: Diagnosis present

## 2022-04-10 DIAGNOSIS — F1010 Alcohol abuse, uncomplicated: Secondary | ICD-10-CM

## 2022-04-10 DIAGNOSIS — Z20822 Contact with and (suspected) exposure to covid-19: Secondary | ICD-10-CM | POA: Diagnosis present

## 2022-04-10 DIAGNOSIS — I63512 Cerebral infarction due to unspecified occlusion or stenosis of left middle cerebral artery: Principal | ICD-10-CM | POA: Diagnosis present

## 2022-04-10 DIAGNOSIS — Z7902 Long term (current) use of antithrombotics/antiplatelets: Secondary | ICD-10-CM

## 2022-04-10 DIAGNOSIS — Z7984 Long term (current) use of oral hypoglycemic drugs: Secondary | ICD-10-CM | POA: Diagnosis not present

## 2022-04-10 DIAGNOSIS — F1410 Cocaine abuse, uncomplicated: Secondary | ICD-10-CM | POA: Diagnosis not present

## 2022-04-10 DIAGNOSIS — I69351 Hemiplegia and hemiparesis following cerebral infarction affecting right dominant side: Secondary | ICD-10-CM

## 2022-04-10 DIAGNOSIS — G4700 Insomnia, unspecified: Secondary | ICD-10-CM

## 2022-04-10 HISTORY — DX: Cerebral infarction, unspecified: I63.9

## 2022-04-10 LAB — DIFFERENTIAL
Abs Immature Granulocytes: 0.01 10*3/uL (ref 0.00–0.07)
Basophils Absolute: 0 10*3/uL (ref 0.0–0.1)
Basophils Relative: 0 %
Eosinophils Absolute: 0.2 10*3/uL (ref 0.0–0.5)
Eosinophils Relative: 3 %
Immature Granulocytes: 0 %
Lymphocytes Relative: 34 %
Lymphs Abs: 2.2 10*3/uL (ref 0.7–4.0)
Monocytes Absolute: 0.4 10*3/uL (ref 0.1–1.0)
Monocytes Relative: 6 %
Neutro Abs: 3.6 10*3/uL (ref 1.7–7.7)
Neutrophils Relative %: 57 %

## 2022-04-10 LAB — I-STAT CHEM 8, ED
BUN: 19 mg/dL (ref 6–20)
Calcium, Ion: 1.13 mmol/L — ABNORMAL LOW (ref 1.15–1.40)
Chloride: 108 mmol/L (ref 98–111)
Creatinine, Ser: 0.6 mg/dL (ref 0.44–1.00)
Glucose, Bld: 140 mg/dL — ABNORMAL HIGH (ref 70–99)
HCT: 38 % (ref 36.0–46.0)
Hemoglobin: 12.9 g/dL (ref 12.0–15.0)
Potassium: 3.9 mmol/L (ref 3.5–5.1)
Sodium: 140 mmol/L (ref 135–145)
TCO2: 22 mmol/L (ref 22–32)

## 2022-04-10 LAB — RAPID URINE DRUG SCREEN, HOSP PERFORMED
Amphetamines: NOT DETECTED
Barbiturates: NOT DETECTED
Benzodiazepines: NOT DETECTED
Cocaine: NOT DETECTED
Opiates: NOT DETECTED
Tetrahydrocannabinol: NOT DETECTED

## 2022-04-10 LAB — COMPREHENSIVE METABOLIC PANEL
ALT: 22 U/L (ref 0–44)
AST: 27 U/L (ref 15–41)
Albumin: 3.6 g/dL (ref 3.5–5.0)
Alkaline Phosphatase: 81 U/L (ref 38–126)
Anion gap: 10 (ref 5–15)
BUN: 15 mg/dL (ref 6–20)
CO2: 21 mmol/L — ABNORMAL LOW (ref 22–32)
Calcium: 9.4 mg/dL (ref 8.9–10.3)
Chloride: 109 mmol/L (ref 98–111)
Creatinine, Ser: 0.61 mg/dL (ref 0.44–1.00)
GFR, Estimated: >60 mL/min (ref >60)
Glucose, Bld: 142 mg/dL — ABNORMAL HIGH (ref 70–99)
Potassium: 3.9 mmol/L (ref 3.5–5.1)
Sodium: 140 mmol/L (ref 135–145)
Total Bilirubin: 0.4 mg/dL (ref 0.3–1.2)
Total Protein: 6.7 g/dL (ref 6.5–8.1)

## 2022-04-10 LAB — ETHANOL: Alcohol, Ethyl (B): <10 mg/dL (ref <10)

## 2022-04-10 LAB — PROTIME-INR
INR: 1 (ref 0.8–1.2)
Prothrombin Time: 13.3 seconds (ref 11.4–15.2)

## 2022-04-10 LAB — URINALYSIS, ROUTINE W REFLEX MICROSCOPIC
Bilirubin Urine: NEGATIVE
Glucose, UA: NEGATIVE mg/dL
Hgb urine dipstick: NEGATIVE
Ketones, ur: NEGATIVE mg/dL
Nitrite: NEGATIVE
Protein, ur: NEGATIVE mg/dL
Specific Gravity, Urine: 1.021 (ref 1.005–1.030)
pH: 5 (ref 5.0–8.0)

## 2022-04-10 LAB — I-STAT BETA HCG BLOOD, ED (MC, WL, AP ONLY): I-stat hCG, quantitative: <5 m[IU]/mL (ref <5)

## 2022-04-10 LAB — CBC
HCT: 38.2 % (ref 36.0–46.0)
Hemoglobin: 11.6 g/dL — ABNORMAL LOW (ref 12.0–15.0)
MCH: 24.4 pg — ABNORMAL LOW (ref 26.0–34.0)
MCHC: 30.4 g/dL (ref 30.0–36.0)
MCV: 80.3 fL (ref 80.0–100.0)
Platelets: 340 10*3/uL (ref 150–400)
RBC: 4.76 MIL/uL (ref 3.87–5.11)
RDW: 15.6 % — ABNORMAL HIGH (ref 11.5–15.5)
WBC: 6.4 10*3/uL (ref 4.0–10.5)
nRBC: 0 % (ref 0.0–0.2)

## 2022-04-10 LAB — CBG MONITORING, ED: Glucose-Capillary: 93 mg/dL (ref 70–99)

## 2022-04-10 LAB — APTT: aPTT: 30 seconds (ref 24–36)

## 2022-04-10 MED ORDER — TRAZODONE HCL 50 MG PO TABS: 25.0000 mg | ORAL_TABLET | Freq: Every evening | ORAL | Status: DC | PRN

## 2022-04-10 MED ORDER — STROKE: EARLY STAGES OF RECOVERY BOOK
Freq: Once | Status: AC
  Filled 2022-04-10: qty 1

## 2022-04-10 MED ORDER — INSULIN ASPART 100 UNIT/ML IJ SOLN: 0.0000 [IU] | Freq: Three times a day (TID) | INTRAMUSCULAR | Status: DC

## 2022-04-10 MED ORDER — CLOPIDOGREL BISULFATE 75 MG PO TABS
75.0000 mg | ORAL_TABLET | Freq: Every day | ORAL | Status: DC
  Administered 2022-04-10 – 2022-04-12 (×3): 75 mg via ORAL
  Filled 2022-04-10 (×3): qty 1

## 2022-04-10 MED ORDER — HYDRALAZINE HCL 20 MG/ML IJ SOLN
5.0000 mg | Freq: Four times a day (QID) | INTRAMUSCULAR | Status: DC | PRN
Start: 2022-04-10 — End: 2022-04-12

## 2022-04-10 MED ORDER — IOHEXOL 350 MG/ML SOLN
75.0000 mL | Freq: Once | INTRAVENOUS | Status: AC | PRN
  Administered 2022-04-10: 75 mL via INTRAVENOUS

## 2022-04-10 MED ORDER — ACETAMINOPHEN 650 MG RE SUPP: 650.0000 mg | RECTAL | Status: DC | PRN

## 2022-04-10 MED ORDER — HYDRALAZINE HCL 25 MG PO TABS: 25.0000 mg | ORAL_TABLET | Freq: Four times a day (QID) | ORAL | Status: DC | PRN

## 2022-04-10 MED ORDER — SENNOSIDES-DOCUSATE SODIUM 8.6-50 MG PO TABS: 1.0000 | ORAL_TABLET | Freq: Every evening | ORAL | Status: DC | PRN

## 2022-04-10 MED ORDER — LORAZEPAM 0.5 MG PO TABS: 0.5000 mg | ORAL_TABLET | Freq: Four times a day (QID) | ORAL | Status: DC | PRN

## 2022-04-10 MED ORDER — ENOXAPARIN SODIUM 40 MG/0.4ML IJ SOSY
40.0000 mg | PREFILLED_SYRINGE | INTRAMUSCULAR | Status: DC
  Administered 2022-04-10 – 2022-04-11 (×2): 40 mg via SUBCUTANEOUS
  Filled 2022-04-10 (×2): qty 0.4

## 2022-04-10 MED ORDER — SODIUM CHLORIDE 0.9 % IV SOLN: INTRAVENOUS | Status: DC

## 2022-04-10 MED ORDER — ATORVASTATIN CALCIUM 80 MG PO TABS
80.0000 mg | ORAL_TABLET | Freq: Every day | ORAL | Status: DC
  Administered 2022-04-10 – 2022-04-12 (×3): 80 mg via ORAL
  Filled 2022-04-10 (×3): qty 1

## 2022-04-10 MED ORDER — ACETAMINOPHEN 325 MG PO TABS: 650.0000 mg | ORAL_TABLET | ORAL | Status: DC | PRN

## 2022-04-10 MED ORDER — ACETAMINOPHEN 160 MG/5ML PO SOLN: 650.0000 mg | ORAL | Status: DC | PRN

## 2022-04-10 NOTE — ED Notes (Signed)
Pt noted to be coughing while eating dinner tray. Tray consist of chopped meat with gravey, green beens and mashed potatoes. Pt was chewing up the meat but continued with the coughing spell. This RN advised the pt to spit the meat out and try the softer foods on the tray. Pt started eating the mashed potatoes  and stated that was easier for her to eat and without coughing. This RN advised no more meat for now that the potatoes are fine for her to eat and pt agreed. MD notified.

## 2022-04-10 NOTE — H&P (Signed)
History and Physical    Roberta Calderon WPY:[id number] DOB: 1970/10/25 DOA: 04/10/2022  PCP: Kerin Perna, NP (Confirm with patient/family/NH records and if not entered, this has to be entered at [facility] point of entry) Patient coming from: Home  I have personally briefly reviewed patient's old medical records in [place]  Chief Complaint: Trouble talking  HPI: Roberta Calderon is a 51 y.o. female with medical history significant of multiple lacunar strokes with residual right-sided weakness, uncontrolled hypertension, history of cocaine abuse, presented with strokelike symptoms.  Around lunchtime, patient developed expressive aphasia and right-sided facial droop, which was noticed by his sister and called EMS.  Patient reported she has been compliant with his BP meds and no longer use cocaine.  Denies any other numbness weakness of any of the limbs  ED Course: CT head small recent left corona radiata perforator infarct.  Blood pressure 140/90  Review of Systems: As per HPI otherwise 14 point review of systems negative.    Past Medical History:  Diagnosis Date   Hypertension     No past surgical history on file.   reports that she has been smoking cigarettes. She has been smoking an average of .25 packs per day. She has never used smokeless tobacco. She reports current alcohol use. She reports that she does not use drugs.  Allergies  Allergen Reactions   Nsaids Nausea And Vomiting and Rash   Aspirin Hives    Family History  Family history unknown: Yes   Right-sided tongue deviation  Prior to Admission medications   Medication Sig Start Date End Date Taking? Authorizing Provider  atorvastatin (LIPITOR) 80 MG tablet Take 1 tablet (80 mg total) by mouth once daily. 02/08/22  Yes Charlott Rakes, MD  hydrochlorothiazide (HYDRODIURIL) 25 MG tablet Take 1 tablet (25 mg total) by mouth once daily. 02/08/22  Yes Charlott Rakes, MD  losartan (COZAAR) 50 MG tablet Take  1 tablet (50 mg total) by mouth once daily. 02/08/22  Yes Charlott Rakes, MD  metFORMIN (GLUCOPHAGE) 500 MG tablet Take 500 mg by mouth 2 (two) times daily with a meal.   Yes [provider]  traZODone (DESYREL) 50 MG tablet Take 0.5-1 tablets (25-50 mg total) by mouth at bedtime as needed for sleep. 02/22/22  Yes Kerin Perna, NP  amLODipine (NORVASC) 10 MG tablet Take 1 tablet (10 mg total) by mouth once daily. Patient not taking: Reported on 04/10/2022 02/08/22   Charlott Rakes, MD  clopidogrel (PLAVIX) 75 MG tablet Take 1 tablet (75 mg total) by mouth once daily. Patient not taking: Reported on 04/10/2022 02/08/22   Charlott Rakes, MD  empagliflozin (JARDIANCE) 10 MG TABS tablet Take 1 tablet (10 mg total) by mouth daily before breakfast. Patient not taking: Reported on 04/10/2022 02/22/22   Kerin Perna, NP  Multiple Vitamins-Minerals (CERTAVITE/ANTIOXIDANTS) TABS Take 1 tablet by mouth daily. Patient not taking: Reported on 02/05/2022 08/18/21   Caren Griffins, MD    Physical Exam: Vitals:   04/10/22 1355 04/10/22 1415 04/10/22 1430  BP: (!) 146/97 (!) 143/81 139/89  Pulse: 67    Resp: '13 14 12  '$ Temp: 97.7 F (36.5 C)    TempSrc: Oral    SpO2: 100%    Weight:  61.2 kg   Height:  '5\' 2"'$  (1.575 m)     Constitutional: NAD, calm, comfortable Vitals:   04/10/22 1355 04/10/22 1415 04/10/22 1430  BP: (!) 146/97 (!) 143/81 139/89  Pulse: 67  Resp: '13 14 12  '$ Temp: 97.7 F (36.5 C)    TempSrc: Oral    SpO2: 100%    Weight:  61.2 kg   Height:  '5\' 2"'$  (1.575 m)    Eyes: PERRL, lids and conjunctivae normal ENMT: Mucous membranes are moist. Posterior pharynx clear of any exudate or lesions.Normal dentition.  Neck: normal, supple, no masses, no thyromegaly Respiratory: clear to auscultation bilaterally, no wheezing, no crackles. Normal respiratory effort. No accessory muscle use.  Cardiovascular: Regular rate and rhythm, no murmurs / rubs / gallops. No extremity  edema. 2+ pedal pulses. No carotid bruits.  Abdomen: no tenderness, no masses palpated. No hepatosplenomegaly. Bowel sounds positive.  Musculoskeletal: no clubbing / cyanosis. No joint deformity upper and lower extremities. Good ROM, no contractures. Normal muscle tone.  Skin: no rashes, lesions, ulcers. No induration Neurologic: Right-sided facial droop, and right-sided tongue deviation. Sensation intact, DTR normal. Strength 4 /5 in right upper and right lower extremity compared to 5/5 in the left side.  Psychiatric: Normal judgment and insight. Alert and oriented x 3. Normal mood.    Labs on Admission: I have personally reviewed following labs and imaging studies  CBC: Recent Labs  Lab 04/10/22 1354  WBC 6.4  NEUTROABS 3.6  HGB 11.6*  12.9  HCT 38.2  38.0  MCV 80.3  PLT 888   Basic Metabolic Panel: Recent Labs  Lab 04/10/22 1354  NA 140  140  K 3.9  3.9  CL 109  108  CO2 21*  GLUCOSE 142*  140*  BUN 15  19  CREATININE 0.61  0.60  CALCIUM 9.4   GFR: Estimated Creatinine Clearance: 71.6 mL/min (by C-G formula based on SCr of 0.6 mg/dL). Liver Function Tests: Recent Labs  Lab 04/10/22 1354  AST 27  ALT 22  ALKPHOS 81  BILITOT 0.4  PROT 6.7  ALBUMIN 3.6   No results for input(s): "LIPASE", "AMYLASE" in the last 168 hours. No results for input(s): "AMMONIA" in the last 168 hours. Coagulation Profile: Recent Labs  Lab 04/10/22 1354  INR 1.0   Cardiac Enzymes: No results for input(s): "CKTOTAL", "CKMB", "CKMBINDEX", "TROPONINI" in the last 168 hours. BNP (last 3 results) No results for input(s): "PROBNP" in the last 8760 hours. HbA1C: No results for input(s): "HGBA1C" in the last 72 hours. CBG: No results for input(s): "GLUCAP" in the last 168 hours. Lipid Profile: No results for input(s): "CHOL", "HDL", "LDLCALC", "TRIG", "CHOLHDL", "LDLDIRECT" in the last 72 hours. Thyroid Function Tests: No results for input(s): "TSH", "T4TOTAL", "FREET4",  "T3FREE", "THYROIDAB" in the last 72 hours. Anemia Panel: No results for input(s): "VITAMINB12", "FOLATE", "FERRITIN", "TIBC", "IRON", "RETICCTPCT" in the last 72 hours. Urine analysis:    Component Value Date/Time   COLORURINE YELLOW 04/10/2022 1338   APPEARANCEUR CLOUDY (A) 04/10/2022 1338   LABSPEC 1.021 04/10/2022 1338   PHURINE 5.0 04/10/2022 1338   GLUCOSEU NEGATIVE 04/10/2022 1338   HGBUR NEGATIVE 04/10/2022 1338   BILIRUBINUR NEGATIVE 04/10/2022 1338   KETONESUR NEGATIVE 04/10/2022 1338   PROTEINUR NEGATIVE 04/10/2022 1338   NITRITE NEGATIVE 04/10/2022 1338   LEUKOCYTESUR TRACE (A) 04/10/2022 1338    Radiological Exams on Admission: CT HEAD CODE STROKE WO CONTRAST  Result Date: 04/10/2022 CLINICAL DATA:  Code stroke.  Neuro deficit, acute, stroke suspected EXAM: CT HEAD WITHOUT CONTRAST TECHNIQUE: Contiguous axial images were obtained from the base of the skull through the vertex without intravenous contrast. RADIATION DOSE REDUCTION: This exam was performed according to the departmental dose-optimization  program which includes automated exposure control, adjustment of the mA and/or kV according to patient size and/or use of iterative reconstruction technique. COMPARISON:  None Available. FINDINGS: Brain: Calcific atherosclerosis.Small recent left corona radiata perforator infarct better characterized on prior MRI. Remote lacunar infarcts and additional patchy to chronic microvascular ischemic disease, similar. No hydrocephalus, mass lesion, midline shift, or acute hemorrhage. Vascular: No hyperdense vessel identified. Skull: No acute fracture. Sinuses/Orbits: Largely clear sinuses.  No acute orbital findings. Other: No mastoid effusions. ASPECTS [facility] Stroke Program Early CT Score) total score (0-10 with 10 being normal): 10. IMPRESSION: 1. No evidence of acute large vascular territory infarct or acute hemorrhage. 2. Small recent left corona radiata perforator infarct better  characterized on prior MRI. 3. Chronic microvascular ischemic disease and remote infarcts. Code stroke imaging results were communicated on 04/10/2022 at 1:59 pm to provider [facility] via secure text paging. Electronically Signed   By: Margaretha Sheffield M.D.   On: 04/10/2022 13:59    EKG: Independently reviewed.  Sinus rhythm, no acute ST changes  Assessment/Plan Principal Problem:   Stroke (cerebrum) (HCC) Active Problems:   Acute CVA (cerebrovascular accident) ([place])  (please populate well all problems here in Problem List. (For example, if patient is on BP meds at home and you resume or decide to hold them, it is a problem that needs to be her. Same for CAD, COPD, HLD and so on)  Acute expressive aphasia and right 7th nerve palsy -Suspect acute CVA. LKN was sometime last night, out of window for TNK. -Continue Plavix, check brain MRI, CT angiogram head and neck. -Overall, unclear etiology, there is some suspicion that patient noncompliant with home BP meds.  However patient does tells me that she takes 6 pills every day which seems correct according to his medication list -Allow permissive hypertension for now, hold off home BP meds, start as needed hydralazine -Speech evaluation, n.p.o. for now -UDS negative -Hx of ASA allergy not a candidate for dual antiplatelet therapy  HTN -As above  IIDM -Hold off home Jardiance, and metformin sliding scale for now  HLD -Continue atorvastatin 80 mg daily  DVT prophylaxis: Lovenox Code Status: Full code Family Communication: None at bedside Disposition Plan: Expect more than 2 midnight hospital stay, need inpatient speech therapist and monitor aspiration risk. Consults called: Neurology Admission status: Telemetry admission   Lequita Halt MD [facility] Pager [phone]  04/10/2022, 4:01 PM

## 2022-04-10 NOTE — ED Notes (Signed)
Patient provided with clean bed sheets, fresh warm blankets, and clean gown. Patient ambulatory to bathroom and perineal care performed by self

## 2022-04-10 NOTE — Code Documentation (Signed)
Stroke Response Nurse Documentation Code Documentation  Roberta Calderon is a 51 y.o. female arriving to [facility]  via [facility] on 04/10/2022 with past medical hx of hypertension, hyperlipidemia, tobacco use, alcohol and substance abuse, and previous stroke most recently June 2023. On clopidogrel 75 mg daily. Code stroke was activated by EMS.   Patient from home where she was LKW at 1250 per sister. However, mother reports seeing a facial droop last night. Presenting now with generalized weakness (right greater than left), facial droop, expressive aphasia and dysarthria.   Stroke team at the bedside on patient arrival. Labs drawn and patient cleared for CT by Dr. Nechama Guard. Patient to CT with team. NIHSS 9, see documentation for details and code stroke times. Patient with disoriented, right facial droop, bilateral arm weakness, bilateral leg weakness, Expressive aphasia , and dysarthria  on exam. The following imaging was completed:  CT Head. Patient is not a candidate for IV Thrombolytic due to out of the window.   Care Plan: Q2 VS and NIHSS.   Bedside handoff with ED RN Lysbeth Galas.    Leverne Humbles  Stroke Response RN

## 2022-04-10 NOTE — ED Notes (Signed)
Pt in MRI.

## 2022-04-10 NOTE — ED Triage Notes (Signed)
Pt bib [facility] from home where she was eating cereal today with her sisters at 1250 where she dropped the spoon and was unable to speak. Pt states that she has weakness on both sides but more on the right arm. Pt arrives as a CODE STROKE with complaints of right side weakness and a headache and aphasia. Pt has a history of strokes and htn. Pt mother states that she noticed a facial twist yesterday along with weakness.  EMS vitals: 175/105, 73HR

## 2022-04-10 NOTE — Consult Note (Addendum)
Neurology Consultation  Reason for Consult: Code Stroke Referring Physician: Dr. Nechama Guard  CC: Speech disturbance, right facial weakness  History is obtained from: EMS, Patient, Chart review, Patient's mother via telephone  HPI: Roberta Calderon is a 51 y.o. African American female with a medical history significant for hypertension, hyperlipidemia, tobacco use, alcohol abuse, substance abuse, and history of multiple previous strokes including her last stroke in June 2023 with MRI evidence of a small left basal ganglia infarction. Other previous strokes occurred in August of 2021 involving scattered areas in the right thalamus, right basal ganglia, and deep white matter in the parietal lobe and in June of 2022 where the patient had a questionable small pontine bleed identified on CT imaging but was unable to tolerate MRI brain at that time. She was again evaluated in December of 2022 with right sided weakness with neuroimaging revealing acute/subacute right basal ganglia infarction. It was noted at this time that the patient declined to participate with the full neurologic examination. At discharge in June 2023, the patient had left upper and lower extremity weakness on exam as well as right facial droop.   Today, Roberta Calderon was with her family in the kitchen when she had sudden onset of speech disturbance. Her siblings stated that she was speaking to them at her baseline before she was suddenly unable to speak with right mouth droop and limping with walking with onset at 12:50 PM. EMS noted that a family member expressed that they felt Roberta Calderon had a stroke yesterday but Roberta Calderon endorsed to EMS that she just felt generally weak yesterday. Roberta Calderon mother was contacted via telephone and provided further history stating that at 17:00 yesterday evening, Roberta Calderon had a "twisted up face" and was "walking with a limp" which is not her baseline. Her last known well time is  unclear.  LKW: Unclear. Per family it was some time during the day yesterday 8/14 TNK given?: no, patient presented outside of the thrombolytic therapy time window. 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  IR Thrombectomy? No, presentation is not consistent with an LVO Modified Rankin Scale: 1-No significant post stroke disability and can perform usual duties with stroke symptoms  ROS: A complete ROS was performed and is negative except as noted in the HPI.   Past Medical History:  Diagnosis Date   Hypertension   No past surgical history on file.  Family History  Family history unknown: Yes   Social History:   reports that she has been smoking cigarettes. She has been smoking an average of .25 packs per day. She has never used smokeless tobacco. She reports current alcohol use. She reports that she does not use drugs.  Medications No current facility-administered medications for this encounter.  Current Outpatient Medications:    amLODipine (NORVASC) 10 MG tablet, Take 1 tablet (10 mg total) by mouth once daily., Disp: 90 tablet, Rfl: 1   atorvastatin (LIPITOR) 80 MG tablet, Take 1 tablet (80 mg total) by mouth once daily., Disp: 90 tablet, Rfl: 1   clopidogrel (PLAVIX) 75 MG tablet, Take 1 tablet (75 mg total) by mouth once daily., Disp: 30 tablet, Rfl: 3   empagliflozin (JARDIANCE) 10 MG TABS tablet, Take 1 tablet (10 mg total) by mouth daily before breakfast., Disp: 90 tablet, Rfl: 1   hydrochlorothiazide (HYDRODIURIL) 25 MG tablet, Take 1 tablet (25 mg total) by mouth once daily., Disp: 90 tablet, Rfl: 3   losartan (COZAAR) 50 MG  tablet, Take 1 tablet (50 mg total) by mouth once daily., Disp: 90 tablet, Rfl: 1   Multiple Vitamins-Minerals (CERTAVITE/ANTIOXIDANTS) TABS, Take 1 tablet by mouth daily. (Patient not taking: Reported on 02/05/2022), Disp: 30 tablet, Rfl: 0   traZODone (DESYREL) 50 MG tablet, Take 0.5-1 tablets  (25-50 mg total) by mouth at bedtime as needed for sleep., Disp: 60 tablet, Rfl: 1  Exam: Current vital signs: BP (!) 146/97 (BP Location: Right Arm)   Pulse 67   Temp 97.7 F (36.5 C) (Oral)   Resp 13   Ht '5\' 2"'$  (1.575 m)   Wt 61.2 kg   SpO2 100%   BMI 24.69 kg/m  Vital signs in last 24 hours: Temp:  [97.7 F (36.5 C)] 97.7 F (36.5 C) (08/15 1355) Pulse Rate:  [67] 67 (08/15 1355) Resp:  [13] 13 (08/15 1355) BP: (146)/(97) 146/97 (08/15 1355) SpO2:  [100 %] 100 % (08/15 1355) Weight:  [61.2 kg] 61.2 kg (08/15 1415)  GENERAL: Pleasant middle-aged African-American lady awake, alert, in no acute distress but tearful and emotional Psych: Patient is anxious and intermittently tearful. She has varying degrees of participation with neurologic exam.  Head: Normocephalic and atraumatic, without obvious abnormality EENT: Normal conjunctivae, dry mucous membranes, no OP obstruction LUNGS: Normal respiratory effort. Non-labored breathing on room air CV: Regular rate and rhythm on telemetry ABDOMEN: Soft, non-tender, non-distended Extremities: Warm, well perfused, without obvious deformity  NEURO:  Mental Status: Awake, alert, and oriented to person, place, time, and situation. She is able to provide a clear and coherent history of present illness. Speech/Language: speech is dysarthric. She is able to communicate with providers and name objects when she is calm. When she is tearful, her speech is more limited. There is no evidence of aphasia.  No neglect is noted Cranial Nerves:  II: PERRL. Visual fields full.  III, IV, VI: EOMI without gaze preference or ptosis V: Sensation is intact to light touch and symmetrical to face.  VII: Right facial weakness noted  VIII: Hearing is intact to voice IX, X: Palate elevation is symmetric. Phonation normal.  XI: Normal sternocleidomastoid and trapezius muscle strength XII: Tongue protrudes midline without fasciculations.   Motor: Strength  exam is effort dependent and variable throughout assessment.  She does appear to have some right upper and lower extremity weakness on exam. (Previous presentations with stroke in the past have noted both right and left-sided weakness with most recent discharge noting residual left-sided weakness not correlating with neuroimaging).  She drifts in the right upper and lower extremities and minimally in the left lower extremity.  Tone is slightly spastic on the right upper and lower extremities. Bulk is normal.  Sensation: Intact to light touch bilaterally in all four extremities.  Coordination: No overt dysmetria noted.  Gait: Deferred for patient safety  NIHSS: 1a Level of Conscious.: 0 1b LOC Questions: 0 1c LOC Commands: 0 2 Best Gaze: 0 3 Visual: 0 4 Facial Palsy: 1 5a Motor Arm - left: 0 5b Motor Arm - Right: 1 6a Motor Leg - Left: 1 6b Motor Leg - Right: 2 7 Limb Ataxia: 0 8 Sensory: 0 9 Best Language: 0 10 Dysarthria: 1 11 Extinct. and Inatten.: 0 TOTAL: 6  Labs I have reviewed labs in epic and the results pertinent to this consultation are: CBC    Component Value Date/Time   WBC 6.4 04/10/2022 1354   RBC 4.76 04/10/2022 1354   HGB 12.9 04/10/2022 1354   HGB 11.6 (L)  04/10/2022 1354   HCT 38.0 04/10/2022 1354   HCT 38.2 04/10/2022 1354   PLT 340 04/10/2022 1354   MCV 80.3 04/10/2022 1354   MCH 24.4 (L) 04/10/2022 1354   MCHC 30.4 04/10/2022 1354   RDW 15.6 (H) 04/10/2022 1354   LYMPHSABS 2.2 04/10/2022 1354   MONOABS 0.4 04/10/2022 1354   EOSABS 0.2 04/10/2022 1354   BASOSABS 0.0 04/10/2022 1354   CMP     Component Value Date/Time   NA 140 04/10/2022 1354   K 3.9 04/10/2022 1354   CL 108 04/10/2022 1354   CO2 26 02/05/2022 0510   GLUCOSE 140 (H) 04/10/2022 1354   BUN 19 04/10/2022 1354   CREATININE 0.60 04/10/2022 1354   CALCIUM 9.8 02/05/2022 0510   PROT 7.3 02/05/2022 0510   ALBUMIN 3.7 02/05/2022 0510   AST 19 02/05/2022 0510   ALT 19 02/05/2022  0510   ALKPHOS 87 02/05/2022 0510   BILITOT 0.6 02/05/2022 0510   GFRNONAA >60 02/05/2022 0510   GFRAA >60 04/11/2020 0810   Lipid Panel     Component Value Date/Time   CHOL 227 (H) 02/05/2022 0510   TRIG 71 02/05/2022 0510   HDL 58 02/05/2022 0510   CHOLHDL 3.9 02/05/2022 0510   VLDL 14 02/05/2022 0510   LDLCALC 155 (H) 02/05/2022 0510   Lab Results  Component Value Date   HGBA1C 6.7 (A) 02/01/2022   Imaging I have reviewed the images obtained:  CT-scan of the brain 8/15: 1. No evidence of acute large vascular territory infarct or acute hemorrhage. 2. Small recent left corona radiata perforator infarct better characterized on prior MRI. 3. Chronic microvascular ischemic disease and remote infarcts.  TEE 6/12: LVEF 60-65% with severe asymmetric left ventricular hypertrophy.  Assessment: 51 y.o. 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 Patient was brought in by EMS as a Code Stroke for further evaluation.  - Examination reveals patient with varying degrees of participation with right upper and lower extremity weakness, dysarthria, and right facial weakness. Her initial NIHSS is a 6.  - Of note, at discharge in June of 2023, patient was noted to have left upper and lower extremity weakness and right facial droop.  - Patient is not a candidate for TNK due to unclear last known well. Initially reported acute onset at 12:50 PM but upon further investigation, the symptoms started at least by 17:00 yesterday 8/14 per patient's mother. Also, on neurologist review of neuroimaging, there is evidence of cerebral microhemorrhages. Not a  candidate for thrombectomy as her presentation is not consistent with an LVO. - Stroke risk factors include history of substance abuse and tobacco smoking, history of strokes, hypertension, and hyperlipidemia.  - Presentation is felt to be most consistent with an acute/subacute left subcortical lacunar infarction secondary to small vessel disease based on patient's current presentation. Will obtain further stroke work up.   Impression: History of CVAs Suspect acute/subacute left subcortical lacunar infarction secondary to small vessel disease Patient has presented outside time window for thrombolysis and clinical exam is not compatible with LVO no acute neurovascular imaging is necessary Recommendations: - HgbA1c, fasting lipid panel- goal LDL < 70 - MRI of the  brain without contrast - CTA head and neck  - Frequent neuro checks - Prophylactic therapy- Antiplatelet med: continue clopidogrel once patient evaluated by SLP for safe swallow evaluation.  There is no data to support long-term Brilinta usage for stroke prevention beyond 4 weeks.  Patient is allergic to ASA. No AC indicated at this time. - Risk factor modification - Telemetry monitoring - PT consult, OT consult, Speech consult - Stroke team to follow  Pt seen by NP/Neuro and later by MD. Note/plan to be edited by MD as needed.  Anibal Henderson, AGAC-NP [facility] Pager: [phone]  STROKE MD NOTE :  I have personally obtained history,examined this patient, reviewed notes, independently viewed imaging studies, participated in medical decision making and plan of care.ROS completed by me personally and pertinent positives fully documented  I have made any additions or clarifications directly to the above note. Agree with note above.  Patient presented with dysarthria and right facial weakness likely from suspected subcortical left brain infarct from small vessel disease.  NIH stroke scale is 6 with some of the deficits  are from old stroke.  CT scan of the head shows no acute abnormality.  Her history is variable with initial EMS time of onset brain earlier today but patient's mother stated that began yesterday.  She has had multiple prior lacunar strokes with previous MRI showing multiple areas of microhemorrhages including both pontine small hemorrhage in June 2022.  Patient is not a candidate for thrombolysis due to onset yesterday and prior history of pontine hemorrhage.  Admit to the medical service and continue ongoing stroke work-up.  Since patient is allergic to aspirin we will continue Plavix and she is able to swallow as there is no data at this report using Brilinta beyond 4 weeks for stroke prevention and she may not be able to afford it.  Aggressive risk factor modification.  Long discussion with patient and ER physician Dr. Chaya Jan and answered questions. Greater than 50% time during this 60-minute consultation visit was spent on counseling and coordination of care about her lacunar stroke question about evaluation, treatment and prevention and answering questions.   Antony Contras, MD Medical Director [facility] Pager: [phone] 04/10/2022 5:21 PM This is statements.  Salicylate so basically information has not been shared by

## 2022-04-10 NOTE — Progress Notes (Signed)
SLP Cancellation Note  Patient Details Name: Roberta Calderon MRN: [id number] DOB: 1971-06-11   Cancelled treatment:       Reason Eval/Treat Not Completed: Patient at procedure or test/unavailable;Other (comment) (Patient at MRI; she passed [place] with RN. SLP will f/u next date.)   Sonia Baller, MA, CCC-SLP Speech Therapy

## 2022-04-10 NOTE — ED Provider Notes (Signed)
[facility] EMERGENCY DEPARTMENT Provider Note   CSN: [id number] Arrival date & time: 04/10/22  1338  An emergency department physician performed an initial assessment on this suspected stroke patient at 1340.  History  Chief Complaint  Patient presents with   Code Stroke    Roberta Calderon is a 51 y.o. female. With previous CVA and right sided weakness on Plavix presenting as code stroke for expressive aphasia and right facial droop.  According to EMS, patient's last known normal was 1220 today.  She had difficulty with expressive aphasia and new right facial droop.  She also reported a mild headache but no head trauma.  Initial blood glucose 120, blood pressure 170/105.  She should be on Plavix.  HPI     Home Medications Prior to Admission medications   Medication Sig Start Date End Date Taking? Authorizing Provider  atorvastatin (LIPITOR) 80 MG tablet Take 1 tablet (80 mg total) by mouth once daily. 02/08/22  Yes Charlott Rakes, MD  hydrochlorothiazide (HYDRODIURIL) 25 MG tablet Take 1 tablet (25 mg total) by mouth once daily. 02/08/22  Yes Charlott Rakes, MD  losartan (COZAAR) 50 MG tablet Take 1 tablet (50 mg total) by mouth once daily. 02/08/22  Yes Charlott Rakes, MD  metFORMIN (GLUCOPHAGE) 500 MG tablet Take 500 mg by mouth 2 (two) times daily with a meal.   Yes [provider]  traZODone (DESYREL) 50 MG tablet Take 0.5-1 tablets (25-50 mg total) by mouth at bedtime as needed for sleep. 02/22/22  Yes Kerin Perna, NP  amLODipine (NORVASC) 10 MG tablet Take 1 tablet (10 mg total) by mouth once daily. Patient not taking: Reported on 04/10/2022 02/08/22   Charlott Rakes, MD  clopidogrel (PLAVIX) 75 MG tablet Take 1 tablet (75 mg total) by mouth once daily. Patient not taking: Reported on 04/10/2022 02/08/22   Charlott Rakes, MD  empagliflozin (JARDIANCE) 10 MG TABS tablet Take 1 tablet (10 mg total) by mouth daily before breakfast. Patient not  taking: Reported on 04/10/2022 02/22/22   Kerin Perna, NP  Multiple Vitamins-Minerals (CERTAVITE/ANTIOXIDANTS) TABS Take 1 tablet by mouth daily. Patient not taking: Reported on 02/05/2022 08/18/21   Caren Griffins, MD      Allergies    Nsaids and Aspirin    Review of Systems   Review of Systems  Physical Exam Updated Vital Signs BP 139/89   Pulse 67   Temp 97.7 F (36.5 C) (Oral)   Resp 12   Ht '5\' 2"'$  (1.575 m)   Wt 61.2 kg   SpO2 100%   BMI 24.69 kg/m  Physical Exam Constitutional: Alert and oriented.  Tearful but no acute distress eyes: Conjunctivae are normal. ENT      Head: Normocephalic and atraumatic.      Nose: No congestion.      Mouth/Throat: Mucous membranes are moist.      Neck: No stridor. Cardiovascular: S1, S2,  Normal and symmetric distal pulses are present in all extremities.Warm and well perfused. Respiratory: Normal respiratory effort.  Gastrointestinal: Soft and nontender.  Musculoskeletal: Normal range of motion in all extremities.      Right lower leg: No tenderness or edema.      Left lower leg: No tenderness or edema. Neurologic: Expressive aphasia present, no receptive aphasia.  Mild right facial droop with forehead sparing.  Able to lift bilateral arms and squeeze with bilateral hands, slightly decreased grip strength on right.  Able to lift right lower extremity against gravity.  Full strength of left lower extremity. Skin: Skin is warm, dry and intact. No rash noted. Psychiatric: Tearful  ED Results / Procedures / Treatments   Labs (all labs ordered are listed, but only abnormal results are displayed) Labs Reviewed  CBC - Abnormal; Notable for the following components:      Result Value   Hemoglobin 11.6 (*)    MCH 24.4 (*)    RDW 15.6 (*)    All other components within normal limits  COMPREHENSIVE METABOLIC PANEL - Abnormal; Notable for the following components:   CO2 21 (*)    Glucose, Bld 142 (*)    All other components within  normal limits  URINALYSIS, ROUTINE W REFLEX MICROSCOPIC - Abnormal; Notable for the following components:   APPearance CLOUDY (*)    Leukocytes,Ua TRACE (*)    Bacteria, UA RARE (*)    All other components within normal limits  I-STAT CHEM 8, ED - Abnormal; Notable for the following components:   Glucose, Bld 140 (*)    Calcium, Ion 1.13 (*)    All other components within normal limits  RESP PANEL BY RT-PCR (FLU A&B, COVID) ARPGX2  ETHANOL  PROTIME-INR  APTT  DIFFERENTIAL  RAPID URINE DRUG SCREEN, HOSP PERFORMED  I-STAT BETA HCG BLOOD, ED ([facility], [facility], [facility] ONLY)    EKG None  Radiology CT HEAD CODE STROKE WO CONTRAST  Result Date: 04/10/2022 CLINICAL DATA:  Code stroke.  Neuro deficit, acute, stroke suspected EXAM: CT HEAD WITHOUT CONTRAST TECHNIQUE: Contiguous axial images were obtained from the base of the skull through the vertex without intravenous contrast. RADIATION DOSE REDUCTION: This exam was performed according to the departmental dose-optimization program which includes automated exposure control, adjustment of the mA and/or kV according to patient size and/or use of iterative reconstruction technique. COMPARISON:  None Available. FINDINGS: Brain: Calcific atherosclerosis.Small recent left corona radiata perforator infarct better characterized on prior MRI. Remote lacunar infarcts and additional patchy to chronic microvascular ischemic disease, similar. No hydrocephalus, mass lesion, midline shift, or acute hemorrhage. Vascular: No hyperdense vessel identified. Skull: No acute fracture. Sinuses/Orbits: Largely clear sinuses.  No acute orbital findings. Other: No mastoid effusions. ASPECTS [facility] Stroke Program Early CT Score) total score (0-10 with 10 being normal): 10. IMPRESSION: 1. No evidence of acute large vascular territory infarct or acute hemorrhage. 2. Small recent left corona radiata perforator infarct better characterized on prior MRI. 3. Chronic microvascular ischemic  disease and remote infarcts. Code stroke imaging results were communicated on 04/10/2022 at 1:59 pm to provider [facility] via secure text paging. Electronically Signed   By: Margaretha Sheffield M.D.   On: 04/10/2022 13:59    Procedures Procedures    Medications Ordered in ED Medications  atorvastatin (LIPITOR) tablet 80 mg (has no administration in time range)  traZODone (DESYREL) tablet 25-50 mg (has no administration in time range)  clopidogrel (PLAVIX) tablet 75 mg (has no administration in time range)   stroke: early stages of recovery book (has no administration in time range)  0.9 %  sodium chloride infusion (has no administration in time range)  acetaminophen (TYLENOL) tablet 650 mg (has no administration in time range)    Or  acetaminophen (TYLENOL) 160 MG/5ML solution 650 mg (has no administration in time range)    Or  acetaminophen (TYLENOL) suppository 650 mg (has no administration in time range)  senna-docusate (Senokot-S) tablet 1 tablet (has no administration in time range)  enoxaparin (LOVENOX) injection 40 mg (has no administration in time range)  insulin aspart (novoLOG) injection 0-15 Units (has no administration in time range)  LORazepam (ATIVAN) tablet 0.5 mg (has no administration in time range)  hydrALAZINE (APRESOLINE) injection 5 mg (has no administration in time range)    ED Course/ Medical Decision Making/ A&P Clinical Course as of 04/10/22 1621  Tue Apr 10, 2022  1403 No ICH on [facility] [VB]  1411 Spoke with Dr Leonie Man from neurology who request that patient be admitted to hospitalist for stroke work-up and MRI which they will put orders in for.  She is not going to require TNK due to history of microhemorrhages as well as unclear last known normal after speaking to family who notes that she was having right facial droop yesterday.  They request speech evaluation and swallow screen.  No aspirin since she is allergic. [VB]  1620 Mild anemia hemoglobin 11.6.  Glucose 142.  No  other acute electrolyte abnormalities.  UA with no UTI. [VB]    Clinical Course User Index [VB] Elgie Congo, MD                           Medical Decision Making Patient presenting to the ED with symptoms concerning for CVA. On arrival, the patient's airway is intact. Per EMS, glucose 120, BP 170/105. Reported symptoms include right facial droop, right-sided weakness, expressive aphasia. Last known normal was 1220 today per EMS however on further discussion with family members likely yesterday. Patient denies recent CVA or trauma, is not anticoagulated but is on Plavix, no sudden onset headache or other symptoms concerning for [facility], no recent surgery. Based on my exam, patient is VAN positive (for aphasia) .   Patient taken for emergent CT upon arrival but is not a TNK candidate due to last known normal greater than 4.5 hours and history of microhemorrhages.  CT negative for hemorrhage. MI/dissection/seizure not suspected based on history. Will consider stroke mimics (Bell's palsy, Todd's paralysis, complex migraine, neuropathy).  Spoke with neurology.  They are not recommending TNK.  They request MRI and stroke work-up and admission to hospitalist.         Amount and/or Complexity of Data Reviewed Labs: ordered. Decision-making details documented in ED Course. Radiology: ordered and independent interpretation performed.    Details: CT head no ICH  Risk Decision regarding hospitalization.    Final Clinical Impression(s) / ED Diagnoses Final diagnoses:  Aphasia  Facial droop    Rx / DC Orders ED Discharge Orders     None         Elgie Congo, MD 04/10/22 1621

## 2022-04-11 ENCOUNTER — Inpatient Hospital Stay (HOSPITAL_COMMUNITY): Payer: Medicaid Other

## 2022-04-11 DIAGNOSIS — I639 Cerebral infarction, unspecified: Secondary | ICD-10-CM

## 2022-04-11 LAB — LIPID PANEL
Cholesterol: 149 mg/dL (ref 0–200)
HDL: 46 mg/dL (ref >40)
LDL Cholesterol: 86 mg/dL (ref 0–99)
Total CHOL/HDL Ratio: 3.2 RATIO
Triglycerides: 87 mg/dL (ref <150)
VLDL: 17 mg/dL (ref 0–40)

## 2022-04-11 LAB — RESP PANEL BY RT-PCR (FLU A&B, COVID) ARPGX2
Influenza A by PCR: NEGATIVE
Influenza B by PCR: NEGATIVE
SARS Coronavirus 2 by RT PCR: NEGATIVE

## 2022-04-11 LAB — CBG MONITORING, ED
Glucose-Capillary: 114 mg/dL — ABNORMAL HIGH (ref 70–99)
Glucose-Capillary: 100 mg/dL — ABNORMAL HIGH (ref 70–99)
Glucose-Capillary: 84 mg/dL (ref 70–99)

## 2022-04-11 LAB — HEMOGLOBIN A1C
Hgb A1c MFr Bld: 6.3 % — ABNORMAL HIGH (ref 4.8–5.6)
Mean Plasma Glucose: 134.11 mg/dL

## 2022-04-11 NOTE — Evaluation (Signed)
Speech Language Pathology Evaluation Patient Details Name: Roberta Calderon MRN: [id number] DOB: [birth year] Today's Date: 04/11/2022 Time: 5638-7564 SLP Time Calculation (min) (ACUTE ONLY): 15 min  Problem List:  Patient Active Problem List   Diagnosis Date Noted   Stroke (cerebrum) ([place]) 04/10/2022   Acute CVA (cerebrovascular accident) ([place]) 02/05/2022   Cerebrovascular accident (CVA) of right basal ganglia ([place]) 08/14/2021   Hypertensive urgency 08/14/2021   Cocaine abuse ([place]) 33/29/5188   Metabolic syndrome 41/66/0630   Right sided weakness 08/14/2021   Alcohol abuse 08/14/2021   Prolonged QT interval 08/14/2021   ICH (intracerebral hemorrhage) ([place]) 02/07/2021   Dyslipidemia    Right thalamic infarction ([place]) 04/08/2020   Prediabetes    Polysubstance abuse ([place])    Hemiparesis affecting left side as late effect of stroke ([place])    Ischemic stroke ([place]) 04/06/2020   Hypertension 04/06/2020   Tobacco use disorder 04/06/2020   Past Medical History:  Past Medical History:  Diagnosis Date   Hypertension    Past Surgical History: No past surgical history on file. HPI:  Patient is a 51 y.o. 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 She presented to the ED on 04/10/22 with stroke-like symptoms (expressive aphasia and right sided facial droop). CT head showed small recent left corona radiata perforator infarct, MRI brain showed Small acute infarct posterior limb internal capsule on the left, extensive chronic microvascular ischemia, extensive chronic microhemorrhage in the brain compatible with poorly controlled hypertension. She passed [facility] but had difficulty with mastication of solids and diet was changed to Dys 2 (minced), thin liquids.   Assessment / Plan / Recommendation Clinical Impression  Patient is currently presenting with a moderately impaired primary motor-based speech disorder with secondary  flaccid dysarthria. Patient denies any prior speech impairments but per SLP notes from previous admission, h/o mild dysarthria noted. Patient exhibited impairments in coordination of respiration with phonation, inconsistent impairments in initiating and sustaining phonation, delay in diadochokinetic rate, glottal sounding phonation, decreased oral-motor movement ROM and inconsistent groping for words. She is oriented x4 and although full cognitive evaluation was not completed due to focus on speech and swallow function, suspect patient is at or near baseline with cognition. SLP recommending continued intervention while hospitalized and f/u at OP for SLP upon discharge.    SLP Assessment  SLP Recommendation/Assessment: Patient needs continued Speech Lanaguage Pathology Services SLP Visit Diagnosis: Dysarthria and anarthria (R47.1);Apraxia (R48.2)    Recommendations for follow up therapy are one component of a multi-disciplinary discharge planning process, led by the attending physician.  Recommendations may be updated based on patient status, additional functional criteria and insurance authorization.    Follow Up Recommendations  Outpatient SLP    Assistance Recommended at Discharge  Frequent or constant Supervision/Assistance  Functional Status Assessment Patient has had a recent decline in their functional status and demonstrates the ability to make significant improvements in function in a reasonable and predictable amount of time.  Frequency and Duration min 2x/week  2 weeks      SLP Evaluation Cognition  Overall Cognitive Status: History of cognitive impairments - at baseline Arousal/Alertness: Awake/alert Orientation Level: Oriented X4 Year: 2023 Month: August Day of Week: Correct Attention: Sustained Sustained Attention: Appears intact Executive Function: Initiating Initiating: Impaired Initiating Impairment: Verbal basic;Verbal complex Behaviors: Poor frustration  tolerance Safety/Judgment: Appears intact       Comprehension  Auditory Comprehension Overall Auditory Comprehension: Appears within functional limits for tasks assessed  Expression Expression Primary Mode of Expression: Verbal Verbal Expression Overall Verbal Expression: Appears within functional limits for tasks assessed   Oral / Motor  Oral Motor/Sensory Function Overall Oral Motor/Sensory Function: Mild impairment Facial ROM: Reduced right Facial Symmetry: Abnormal symmetry right Facial Strength: Reduced right Lingual ROM: Reduced right;Reduced left Lingual Strength: Reduced Mandible: Impaired Motor Speech Overall Motor Speech: Impaired Respiration: Impaired Level of Impairment: Phrase Phonation: Other (comment) (discoordination of respiration and phonation) Resonance: Within functional limits Articulation: Impaired Level of Impairment: Word Intelligibility: Intelligibility reduced Word: 50-74% accurate Phrase: 25-49% accurate Sentence: Not tested Conversation: Not tested Motor Planning: Impaired Level of Impairment: Word Motor Speech Errors: Inconsistent Effective Techniques: Slow rate            Sonia Baller, MA, CCC-SLP Speech Therapy

## 2022-04-11 NOTE — Progress Notes (Addendum)
Stroke Team Progress Note  SUBJECTIVE Patient presented yesterday with sudden onset of severe dysarthria and right facial weakness which has persisted.  MRI scan shows small left subcortical lacunar infarct as well as multiple old lacunar infarcts of remote age as well as multiple cerebral microhemorrhages during hemorrhage in the brainstem and left subcortical region from small vessel disease.  LDL cholesterol is elevated at 155 mg percent H  OBJECTIVE Most recent Vital Signs: Temp: 98.3 F (36.8 C) (08/16 1330) Temp Source: Oral (08/16 1330) BP: 139/84 (08/16 1330) Pulse Rate: 64 (08/16 1330) Respiratory Rate: 17 O2 Saturdation: 99% She is awake alert oriented to time place and person speech is markedly dysarthric but can be understood.  She is emotionally labile and starts crying during exam but can be consoled.  Follows commands well.  Extraocular movements full range without nystagmus.  Blinks to threat bilaterally.  Moderate right lower facial weakness.  Tongue midline.  Motor system exam shows symmetric upper and lower extremity strength with diminished fine finger movements on the right and mild receptive right upper extremity.  Trace weakness of right grip and right ankle dorsiflexors.  Tone is increased slightly in the right compared to the left.  Deep tendon flexes are brisk throughout but symmetric and brisk on the right.  Untestable equivocal.  Sensations preserved bilaterally.  Gait not tested CBG (last 3)  Recent Labs    04/11/22 0338 04/11/22 0735 04/11/22 1227  GLUCAP 114* 100* 84       Studies:  CT head no acute abnormality.  Subacute left coronary artery infarct. MRI brain small acute left posterior limb internal capsule infarct.  Extensive changes of chronic small vessel disease and microhemorrhages CTA head and neck no large vessel occlusion or significant stenosis. ECHO 02/05/2022: Ejection fraction 60 to 65%.  No cardiac source of embolism LDL 155 mg percent.   HbA1cpending  Physical Exam:   Pleasant middle-aged African-American lady not in distress.  Emotionally labile and crying. . Afebrile. Head is nontraumatic. Neck is supple without bruit.    Cardiac exam no murmur or gallop. Lungs are clear to auscultation. Distal pulses are well felt.  . Afebrile. Head is nontraumatic. Neck is supple without bruit.    Cardiac exam no murmur or gallop. Lungs are clear to auscultation. Distal pulses are well felt. . Afebrile. Head is nontraumatic. Neck is supple without bruit.    Cardiac exam no murmur or gallop. Lungs are clear to auscultation. Distal pulses are well felt. Neurological Exam: She is awake alert oriented to time place and person speech is markedly dysarthric but can be understood.  She is emotionally labile and starts crying during exam but can be consoled.  Follows commands well.  Extraocular movements full range without nystagmus.  Blinks to threat bilaterally.  Moderate right lower facial weakness.  Tongue midline.  Motor system exam shows symmetric upper and lower extremity strength with diminished fine finger movements on the right and mild receptive right upper extremity.  Trace weakness of right grip and right ankle dorsiflexors.  Tone is increased slightly in the right compared to the left.  Deep tendon flexes are brisk throughout but symmetric and brisk on the right.  Untestable equivocal.  Sensations preserved bilaterally.  Gait not tested ASSESSMENT [name] is a 51 y.o. female with 2-day history of dysarthria right facial droop secondary to left internal capsule lacunar infarct from small vessel disease likely due to uncontrolled risk factors.  Prior history of multiple lacunar strokes  in history of small intracranial hemorrhage.  She presented outside time window for thrombolysis and it was contraindicated anyhow because of her significant microhemorrhages and brain hemorrhage history.  Hospital day # 1  TREATMENT/PLAN Recommend  Plavix alone as patient is allergic to aspirin.  Aggressive risk factor modification.  Physical occupational and speech therapy consults.  Mobilize out of bed.  Stroke team will sign off.  Follow-up as an outpatient with stroke clinic.  Discussed with Dr.Ghimire.  Stroke team will sign off.  Kindly call for questions. I spent 35  minutes in total face-to-face time with the patient, more than 50% of which was spent in counseling and coordination of care, reviewing test results, reviewing medication and discussing or reviewing the diagnosis of lacunar stroke   , the prognosis and treatment options.   Antony Contras, MD Medical Director [facility] Pager: [phone] 04/11/2022 2:10 PM

## 2022-04-11 NOTE — Progress Notes (Signed)
Patient brought to room 3w31 from the ED.  Patient is alert and oriented x4.  She was a stand by assist to the bed.  No needs at this time.

## 2022-04-11 NOTE — Evaluation (Signed)
Physical Therapy Evaluation Patient Details Name: Roberta Calderon MRN: [id number] DOB: 1971/01/05 Today's Date: 04/11/2022  History of Present Illness  Pt is a 51 y/o female admitted secondary to dysarthria and R facial weakness. MRI showed infarct in posterior limb of internal capsule on the left. PMH includes multiple CVA, HTN, and DM.  Clinical Impression  Pt admitted secondary to problem above with deficits below. Notable speech deficits and difficulty with word finding, especially when fatigued. Requiring min guard up to min A for occasional LOB during mobility tasks. Feel pt would benefit from RW to increase safety. Recommending outpatient PT to address deficits. Will continue to follow acutely.      Recommendations for follow up therapy are one component of a multi-disciplinary discharge planning process, led by the attending physician.  Recommendations may be updated based on patient status, additional functional criteria and insurance authorization.  Follow Up Recommendations Outpatient PT      Assistance Recommended at Discharge Frequent or constant Supervision/Assistance  Patient can return home with the following  A little help with walking and/or transfers;A little help with bathing/dressing/bathroom;Assistance with cooking/housework;Help with stairs or ramp for entrance;Assist for transportation    Equipment Recommendations Rolling walker (2 wheels)  Recommendations for Other Services       Functional Status Assessment Patient has had a recent decline in their functional status and demonstrates the ability to make significant improvements in function in a reasonable and predictable amount of time.     Precautions / Restrictions Precautions Precautions: Fall Restrictions Weight Bearing Restrictions: No      Mobility  Bed Mobility Overal bed mobility: Needs Assistance Bed Mobility: Supine to Sit, Sit to Supine     Supine to sit: Supervision Sit to supine: Min  assist   General bed mobility comments: Required min A for RLE to return to supine    Transfers Overall transfer level: Needs assistance Equipment used: None Transfers: Sit to/from Stand Sit to Stand: Min guard           General transfer comment: Min guard for safety    Ambulation/Gait Ambulation/Gait assistance: Min guard, Min assist Gait Distance (Feet): 150 Feet Assistive device: 1 person hand held assist, Rolling walker (2 wheels) Gait Pattern/deviations: Step-through pattern, Decreased stride length Gait velocity: Decreased     General Gait Details: Functional weakness noted on the RLE. Asking for RW to improve stability. Min guard to occasional min A. Mild LOB noted with turns.  Stairs            Wheelchair Mobility    Modified Rankin (Stroke Patients Only)       Balance Overall balance assessment: Needs assistance Sitting-balance support: No upper extremity supported, Feet supported Sitting balance-Leahy Scale: Good     Standing balance support: No upper extremity supported, Bilateral upper extremity supported Standing balance-Leahy Scale: Fair Standing balance comment: Able to maintain static standing without UE support                             Pertinent Vitals/Pain Pain Assessment Pain Assessment: No/denies pain    Home Living Family/patient expects to be discharged to:: Private residence Living Arrangements: Parent Available Help at Discharge: Family;Available PRN/intermittently Type of Home: House Home Access: Stairs to enter Entrance Stairs-Rails: None Entrance Stairs-Number of Steps: 3   Home Layout: One level Home Equipment: None      Prior Function Prior Level of Function : Needs assist  Mobility Comments: Reports she does not usually use AD ADLs Comments: Reports she needs assist from mother with ADL tasks     Hand Dominance        Extremity/Trunk Assessment   Upper Extremity  Assessment Upper Extremity Assessment: Defer to OT evaluation    Lower Extremity Assessment Lower Extremity Assessment: RLE deficits/detail RLE Deficits / Details: RLE weakness at baseline    Cervical / Trunk Assessment Cervical / Trunk Assessment: Kyphotic  Communication   Communication: Expressive difficulties  Cognition Arousal/Alertness: Awake/alert Behavior During Therapy: WFL for tasks assessed/performed Overall Cognitive Status: History of cognitive impairments - at baseline                                 General Comments: Impulsive with mobility tasks. Requiring safety cues throughout.        General Comments      Exercises     Assessment/Plan    PT Assessment Patient needs continued PT services  PT Problem List Decreased strength;Decreased balance;Decreased activity tolerance;Decreased mobility;Decreased knowledge of use of DME;Decreased knowledge of precautions       PT Treatment Interventions DME instruction;Gait training;Stair training;Functional mobility training;Therapeutic activities;Therapeutic exercise;Balance training;Patient/family education    PT Goals (Current goals can be found in the Care Plan section)  Acute Rehab PT Goals Patient Stated Goal: to get better PT Goal Formulation: With patient Time For Goal Achievement: 04/25/22 Potential to Achieve Goals: Good    Frequency Min 4X/week     Co-evaluation               AM-PAC PT "6 Clicks" Mobility  Outcome Measure Help needed turning from your back to your side while in a flat bed without using bedrails?: None Help needed moving from lying on your back to sitting on the side of a flat bed without using bedrails?: None Help needed moving to and from a bed to a chair (including a wheelchair)?: A Little Help needed standing up from a chair using your arms (e.g., wheelchair or bedside chair)?: A Little Help needed to walk in hospital room?: A Little Help needed climbing 3-5  steps with a railing? : A Little 6 Click Score: 20    End of Session Equipment Utilized During Treatment: Gait belt Activity Tolerance: Patient tolerated treatment well Patient left: in bed;with call bell/phone within reach (on stretcher in ED) Nurse Communication: Mobility status PT Visit Diagnosis: Unsteadiness on feet (R26.81);Muscle weakness (generalized) (M62.81)    Time: 0233-4356 PT Time Calculation (min) (ACUTE ONLY): 12 min   Charges:   PT Evaluation $PT Eval Low Complexity: 1 Low          Reuel Derby, PT, DPT  Acute Rehabilitation Services  Office: [phone]   Rudean Hitt 04/11/2022, 3:36 PM

## 2022-04-11 NOTE — Progress Notes (Signed)
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  She developed expressive aphasia and right-sided facial droop, noticed by her sister and EMS was called and brought to the ER.  In the emergency room hemodynamically stable.  CT scan of the head with a small recent left corona radiata infarct.  Admitted with acute to stroke.   Assessment & Plan:   Acute ischemic stroke: Left MCA territory stroke.  History of previous strokes. Clinical findings, right facial weakness, right lower extremity weakness causing limping gait, acute onset of speech disturbance.  Varying degree of participation with right upper and lower extremity weakness, dysarthria and right facial droop. CT head findings, small recent left corona radiata infarct.  Chronic microvascular ischemic disease. MRI of the brain, small acute infarct posterior limb internal capsule on the left side.  Extensive chronic microvascular ischemia.  Extensive chronic microhemorrhages in the brain compatible with poorly controlled hypertension. CT angiogram of the head and neck, no intracranial large vessel occlusion or significant stenosis.  No hemodynamically significant stenosis in the neck. 2D echocardiogram, from 6/12 normal ejection fraction.  Severe asymmetrical left ventricular hypertrophy.  Grade 1 diastolic dysfunction.  No intracardiac thrombus. Antiplatelet therapy, allergic to aspirin.  On Plavix monotherapy from home.  Continue Plavix. LDL 86.  On atorvastatin 80 mg daily. Hemoglobin A1c, 6.7. DVT prophylaxis, ask Therapy recommendations, pending We will start aggressive blood pressure control by next 24 to 48 hours. Seen by neurology.  No new recommendations. Will need  aggressive speech, PT OT.  Type 2 diabetes, well controlled with hyperglycemia.  A1c 6.7.  Patient on metformin and Jardiance at home.  Hold and start on SSI.  Essential hypertension, as above.  Blood pressures are fairly stable today.  We will start controlling next 24 hours.  Hyperlipidemia: As above.  On atorvastatin 80 mg daily.    DVT prophylaxis: enoxaparin (LOVENOX) injection 40 mg Start: 04/10/22 1800   Code Status: Full code Family Communication: None Disposition Plan: Status is: Inpatient Remains inpatient appropriate because: Acute to stroke evaluation, therapies and anticipate rehab.     Consultants:  Neurology  Procedures:  None  Antimicrobials:  None   Subjective: Patient seen in the morning rounds.  She tells me she cannot talk well and unable to understand her own speech.  Still in the emergency room.  Objective: Vitals:   04/11/22 0530 04/11/22 0746 04/11/22 1005 04/11/22 1330  BP: 130/78  135/80 139/84  Pulse: 67  65 64  Resp: '13  15 17  '$ Temp:  98.6 F (37 C) 98.4 F (36.9 C) 98.3 F (36.8 C)  TempSrc:  Oral Oral Oral  SpO2: 99%  99% 99%  Weight:      Height:       No intake or output data in the 24 hours ending 04/11/22 1505 Filed Weights   04/10/22 1415  Weight: 61.2 kg    Examination:  General exam: Appears calm and comfortable  Chronically sick looking.  Older than his stated age. Awake and alert.  Dysarthric.  Able to understand.  Emotionally labile.  Cries. Mild right facial droop. Both extremities are weak.  Right is more than left. Respiratory system: Clear to auscultation. Respiratory effort normal. Cardiovascular system: S1 & S2 heard, RRR. No JVD, murmurs, rubs, gallops  or clicks. No pedal edema. Gastrointestinal system: Soft.  Nontender.  Bowel sound present. Data Reviewed: I have personally reviewed following labs and imaging studies  CBC: Recent Labs  Lab 04/10/22 1354  WBC 6.4  NEUTROABS 3.6  HGB 11.6*  12.9   HCT 38.2  38.0  MCV 80.3  PLT 295   Basic Metabolic Panel: Recent Labs  Lab 04/10/22 1354  NA 140  140  K 3.9  3.9  CL 109  108  CO2 21*  GLUCOSE 142*  140*  BUN 15  19  CREATININE 0.61  0.60  CALCIUM 9.4   GFR: Estimated Creatinine Clearance: 71.6 mL/min (by C-G formula based on SCr of 0.6 mg/dL). Liver Function Tests: Recent Labs  Lab 04/10/22 1354  AST 27  ALT 22  ALKPHOS 81  BILITOT 0.4  PROT 6.7  ALBUMIN 3.6   No results for input(s): "LIPASE", "AMYLASE" in the last 168 hours. No results for input(s): "AMMONIA" in the last 168 hours. Coagulation Profile: Recent Labs  Lab 04/10/22 1354  INR 1.0   Cardiac Enzymes: No results for input(s): "CKTOTAL", "CKMB", "CKMBINDEX", "TROPONINI" in the last 168 hours. BNP (last 3 results) No results for input(s): "PROBNP" in the last 8760 hours. HbA1C: No results for input(s): "HGBA1C" in the last 72 hours. CBG: Recent Labs  Lab 04/10/22 1737 04/11/22 0338 04/11/22 0735 04/11/22 1227  GLUCAP 93 114* 100* 84   Lipid Profile: Recent Labs    04/11/22 0258  CHOL 149  HDL 46  LDLCALC 86  TRIG 87  CHOLHDL 3.2   Thyroid Function Tests: No results for input(s): "TSH", "T4TOTAL", "FREET4", "T3FREE", "THYROIDAB" in the last 72 hours. Anemia Panel: No results for input(s): "VITAMINB12", "FOLATE", "FERRITIN", "TIBC", "IRON", "RETICCTPCT" in the last 72 hours. Sepsis Labs: No results for input(s): "PROCALCITON", "LATICACIDVEN" in the last 168 hours.  Recent Results (from the past 240 hour(s))  Resp Panel by RT-PCR (Flu A&B, Covid) Anterior Nasal Swab     Status: None   Collection Time: 04/10/22  1:40 PM   Specimen: Anterior Nasal Swab  Result Value Ref Range Status   SARS Coronavirus 2 by RT PCR NEGATIVE NEGATIVE Final    Comment: (NOTE) SARS-CoV-2 target nucleic acids are NOT DETECTED.  The SARS-CoV-2 RNA is generally detectable in upper respiratory specimens during the acute phase of infection. The  lowest concentration of SARS-CoV-2 viral copies this assay can detect is 138 copies/mL. A negative result does not preclude SARS-Cov-2 infection and should not be used as the sole basis for treatment or other patient management decisions. A negative result may occur with  improper specimen collection/handling, submission of specimen other than nasopharyngeal swab, presence of viral mutation(s) within the areas targeted by this assay, and inadequate number of viral copies(<138 copies/mL). A negative result must be combined with clinical observations, patient history, and epidemiological information. The expected result is Negative.  Fact Sheet for Patients:  EntrepreneurPulse.com.au  Fact Sheet for Healthcare Providers:  IncredibleEmployment.be  This test is no t yet approved or cleared by the [place] FDA and  has been authorized for detection and/or diagnosis of SARS-CoV-2 by FDA under an Emergency Use Authorization (EUA). This EUA will remain  in effect (meaning this test can be used) for the duration of the COVID-19 declaration under Section 564(b)(1) of the Act, 21 U.S.C.section 360bbb-3(b)(1), unless the authorization is terminated  or revoked sooner.       Influenza A by PCR NEGATIVE NEGATIVE Final   Influenza B by  PCR NEGATIVE NEGATIVE Final    Comment: (NOTE) The Xpert Xpress SARS-CoV-2/FLU/RSV plus assay is intended as an aid in the diagnosis of influenza from Nasopharyngeal swab specimens and should not be used as a sole basis for treatment. Nasal washings and aspirates are unacceptable for Xpert Xpress SARS-CoV-2/FLU/RSV testing.  Fact Sheet for Patients: EntrepreneurPulse.com.au  Fact Sheet for Healthcare Providers: IncredibleEmployment.be  This test is not yet approved or cleared by the [place] FDA and has been authorized for detection and/or diagnosis of SARS-CoV-2 by FDA under  an Emergency Use Authorization (EUA). This EUA will remain in effect (meaning this test can be used) for the duration of the COVID-19 declaration under Section 564(b)(1) of the Act, 21 U.S.C. section 360bbb-3(b)(1), unless the authorization is terminated or revoked.  Performed at [facility], [place] [address] [place], [place] [address]          Radiology Studies: DG Swallowing Func-Speech Pathology  Result Date: 04/11/2022 Table formatting from the original result was not included. Objective Swallowing Evaluation: Type of Study: MBS-Modified Barium Swallow Study  Patient Details Name: LORIE CLECKLEY MRN: [id number] Date of Birth: July 31, 1971 Today's Date: 04/11/2022 Time: SLP Start Time (ACUTE ONLY): 1000 -SLP Stop Time (ACUTE ONLY): 1020 SLP Time Calculation (min) (ACUTE ONLY): 20 min Past Medical History: Past Medical History: Diagnosis Date  Hypertension  Past Surgical History: No past surgical history on file. HPI: Patient is a 51 y.o. 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 She presented to the ED on 04/10/22 with stroke-like symptoms (expressive aphasia and right sided facial droop). CT head showed small recent left corona radiata perforator infarct, MRI brain showed Small acute infarct posterior limb internal capsule on the left, extensive chronic microvascular ischemia, extensive chronic microhemorrhage in the brain compatible with poorly controlled hypertension. She passed [facility] but had difficulty with mastication of solids and diet was changed to Dys 2 (minced), thin liquids.  Subjective: cooperative, awake, alert  Recommendations for follow up therapy are one component of a multi-disciplinary discharge planning process, led by the attending physician.  Recommendations may be updated based on patient status, additional functional criteria and insurance authorization. Assessment / Plan / Recommendation    04/11/2022  12:10 PM Clinical Impressions Clinical Impression Patient presents with a mild oropharyngeal dysphagia as per this MBS. She exhibited prolonged mastication and oral transit of mechanical soft solids and purees and with mild oral residuals in left side oral cavity with mechanical soft. Patient was aware of residue and able to clear with lingual sweep. Swallow initiation delay to level of the vallecular sinus occured with puree and mechanical soft solids and delays to the pyriform sinus occured with thin and nectar thick liquids. Flash penetration (PAS 2) occured with nectar thick liquids and deeper penetration that cleared fully (PAS 4) occured during the swallow with thin liquids with no significant difference observed between cup and straw sips. No aspiration observed and no pharyngeal residuals remained after initial swallows. SLP recommending continue with Dys 2 solids, thin liquids at this time but if patient exhibiting excessive coughing, can consider nectar thick liquids. SLP Visit Diagnosis Dysphagia, oropharyngeal phase (R13.12) Impact on safety and function Mild aspiration risk     04/11/2022  12:05 PM Treatment Recommendations Treatment Recommendations Therapy as outlined in treatment plan below     04/11/2022  12:14 PM Prognosis Prognosis for Safe Diet Advancement Good Barriers to Reach Goals Time post onset   04/11/2022  12:05 PM Diet Recommendations SLP Diet Recommendations Dysphagia 2 (Fine chop) solids;Thin liquid Liquid Administration via Cup;Straw Medication Administration Whole meds with puree Compensations Slow rate;Small sips/bites Postural Changes Seated upright at 90 degrees     04/11/2022  12:05 PM Other Recommendations Oral Care Recommendations Oral care BID Follow Up Recommendations Outpatient SLP Assistance recommended at discharge Intermittent Supervision/Assistance Functional Status Assessment Patient has had a recent decline in their functional status and demonstrates the ability  to make significant improvements in function in a reasonable and predictable amount of time.   04/11/2022  12:05 PM Frequency and Duration  Speech Therapy Frequency (ACUTE ONLY) min 2x/week Treatment Duration 2 weeks     04/11/2022  11:31 AM Oral Phase Oral Phase Impaired Oral - Nectar Cup Delayed oral transit Oral - Thin Cup Delayed oral transit Oral - Thin Straw Delayed oral transit Oral - Puree Reduced posterior propulsion;Weak lingual manipulation Oral - Mech Soft Impaired mastication;Reduced posterior propulsion;Delayed oral transit;Right pocketing in lateral sulci;Weak lingual manipulation Oral - Pill Reduced posterior propulsion;Delayed oral transit;Weak lingual manipulation    04/11/2022  11:32 AM Pharyngeal Phase Pharyngeal Phase Impaired Pharyngeal- Nectar Cup Delayed swallow initiation-pyriform sinuses;Penetration/Aspiration during swallow;Reduced airway/laryngeal closure Pharyngeal Material enters airway, remains ABOVE vocal cords then ejected out Pharyngeal- Thin Cup Delayed swallow initiation-pyriform sinuses;Reduced airway/laryngeal closure;Penetration/Aspiration during swallow Pharyngeal Material enters airway, CONTACTS cords and then ejected out;Material enters airway, remains ABOVE vocal cords then ejected out Pharyngeal- Thin Straw Delayed swallow initiation-pyriform sinuses;Reduced airway/laryngeal closure Pharyngeal Material enters airway, CONTACTS cords and then ejected out;Material enters airway, remains ABOVE vocal cords then ejected out Pharyngeal- Puree Delayed swallow initiation-vallecula;Reduced pharyngeal peristalsis Pharyngeal- Mechanical Soft Delayed swallow initiation-vallecula;Reduced pharyngeal peristalsis Pharyngeal- Pill Delayed swallow initiation-vallecula    04/11/2022  11:34 AM Cervical Esophageal Phase  Cervical Esophageal Phase Darryll Capers Sonia Baller, MA, CCC-SLP Speech Therapy                     CT ANGIO HEAD NECK W WO CM  Result Date: 04/10/2022 CLINICAL DATA:  Acute infarct  in the posterior limb of the internal capsule on same-day MRI EXAM: CT ANGIOGRAPHY HEAD AND NECK TECHNIQUE: Multidetector CT imaging of the head and neck was performed using the standard protocol during bolus administration of intravenous contrast. Multiplanar CT image reconstructions and MIPs were obtained to evaluate the vascular anatomy. Carotid stenosis measurements (when applicable) are obtained utilizing NASCET criteria, using the distal internal carotid diameter as the denominator. RADIATION DOSE REDUCTION: This exam was performed according to the departmental dose-optimization program which includes automated exposure control, adjustment of the mA and/or kV according to patient size and/or use of iterative reconstruction technique. CONTRAST:  74m OMNIPAQUE IOHEXOL 350 MG/ML SOLN COMPARISON:  02/13/2022 CTA head neck, correlation is made with 04/10/2022 CT head FINDINGS: CT HEAD FINDINGS For noncontrast findings, please see same day CT head. CTA NECK FINDINGS Aortic arch: Standard branching. Imaged portion shows no evidence of aneurysm or dissection. No significant stenosis of the major arch vessel origins. Right carotid system: No evidence of dissection, occlusion, or hemodynamically significant stenosis (greater than 50%). Atherosclerotic disease at the bifurcation and in the proximal ICA is not hemodynamically significant. Left carotid system: No evidence of dissection, occlusion, or hemodynamically significant stenosis (greater than 50%). Atherosclerotic disease at the bifurcation and in the proximal ICA is not hemodynamically significant. Vertebral arteries: No evidence of dissection, occlusion, or hemodynamically significant stenosis (greater than 50%). Skeleton: No acute osseous abnormality. Degenerative changes in the cervical spine. Periapical  lucencies about several maxillary teeth roots. Other neck: Negative. Upper chest: No focal pulmonary opacity or pleural effusion. Review of the MIP images  confirms the above findings CTA HEAD FINDINGS Anterior circulation: Both internal carotid arteries are patent to the termini, with calcifications in the cavernous and supraclinoid segments but without significant stenosis. A1 segments patent. Normal anterior communicating artery. Anterior cerebral arteries are patent to their distal aspects. No M1 stenosis or occlusion. MCA branches perfused and symmetric. Posterior circulation: Vertebral arteries patent to the vertebrobasilar junction without stenosis. Inferior cerebellar arteries patent proximally. Basilar patent to its distal aspect. Superior cerebellar arteries patent proximally. Patent P1 segments. PCAs perfused to their distal aspects without stenosis. The bilateral posterior communicating arteries are not visualized. Venous sinuses: As permitted by contrast timing, patent. Anatomic variants: None significant. Review of the MIP images confirms the above findings IMPRESSION: 1.  No intracranial large vessel occlusion or significant stenosis. 2.  No hemodynamically significant stenosis in the neck. Electronically Signed   By: Merilyn Baba M.D.   On: 04/10/2022 19:19   MR BRAIN WO CONTRAST  Result Date: 04/10/2022 CLINICAL DATA:  Acute neuro deficit. History of stroke, hypertension, cocaine abuse. EXAM: MRI HEAD WITHOUT CONTRAST TECHNIQUE: Multiplanar, multiecho pulse sequences of the brain and surrounding structures were obtained without intravenous contrast. COMPARISON:  CT head 04/10/2022.  MRI head 02/04/2022 FINDINGS: Brain: Acute infarct posterior limb internal capsule on the left. No other acute infarct. Extensive chronic microvascular ischemic changes. Chronic lacunar infarcts in the white matter, basal ganglia, thalamus, and pons bilaterally. Chronic ischemia in the brachium pontis bilaterally. Numerous areas of chronic microhemorrhage including the cerebellum, brainstem, basal ganglia and thalamus bilaterally and in the cerebral white matter  bilaterally. Pattern consistent with poorly controlled hypertension. Vascular: Normal arterial flow voids Skull and upper cervical spine: No focal lesion. Sinuses/Orbits: Mild mucosal edema paranasal sinuses. Negative orbit Other: None IMPRESSION: Small acute infarct posterior limb internal capsule on the left Extensive chronic microvascular ischemia. Extensive chronic microhemorrhage in the brain compatible with poorly controlled hypertension. Electronically Signed   By: Franchot Gallo M.D.   On: 04/10/2022 17:02   CT HEAD CODE STROKE WO CONTRAST  Result Date: 04/10/2022 CLINICAL DATA:  Code stroke.  Neuro deficit, acute, stroke suspected EXAM: CT HEAD WITHOUT CONTRAST TECHNIQUE: Contiguous axial images were obtained from the base of the skull through the vertex without intravenous contrast. RADIATION DOSE REDUCTION: This exam was performed according to the departmental dose-optimization program which includes automated exposure control, adjustment of the mA and/or kV according to patient size and/or use of iterative reconstruction technique. COMPARISON:  None Available. FINDINGS: Brain: Calcific atherosclerosis.Small recent left corona radiata perforator infarct better characterized on prior MRI. Remote lacunar infarcts and additional patchy to chronic microvascular ischemic disease, similar. No hydrocephalus, mass lesion, midline shift, or acute hemorrhage. Vascular: No hyperdense vessel identified. Skull: No acute fracture. Sinuses/Orbits: Largely clear sinuses.  No acute orbital findings. Other: No mastoid effusions. ASPECTS [facility] Stroke Program Early CT Score) total score (0-10 with 10 being normal): 10. IMPRESSION: 1. No evidence of acute large vascular territory infarct or acute hemorrhage. 2. Small recent left corona radiata perforator infarct better characterized on prior MRI. 3. Chronic microvascular ischemic disease and remote infarcts. Code stroke imaging results were communicated on 04/10/2022 at  1:59 pm to provider [facility] via secure text paging. Electronically Signed   By: Margaretha Sheffield M.D.   On: 04/10/2022 13:59        Scheduled Meds:  atorvastatin  80 mg Oral Daily   clopidogrel  75 mg Oral Daily   enoxaparin (LOVENOX) injection  40 mg Subcutaneous Q24H   insulin aspart  0-15 Units Subcutaneous TID WC   Continuous Infusions:   LOS: 1 day    Time spent: 35 minutes    Barb Merino, MD [facility] Pager [phone]

## 2022-04-11 NOTE — Evaluation (Signed)
Occupational Therapy Evaluation Patient Details Name: Roberta Calderon MRN: [id number] DOB: 03/10/1971 Today's Date: 04/11/2022   History of Present Illness Pt is a 51 y/o female admitted secondary to dysarthria and R facial weakness. MRI showed infarct in posterior limb of internal capsule on the left. PMH includes multiple CVA, HTN, and DM.   Clinical Impression   Pt admitted for concerns listed above. PTA pt reported that she was independent with all ADL's and functional mobility. At this time, pt is limited by balance, weakness, and R sided incoordination/weakness.  Requiring up to mod A for ADL's and min A with RW for functional mobility. Pt easily irritated with questions and multistep commands. Recommending OP OT, as long as pt has 24/7 supervision/assist at home. OT will follow acutely.      Recommendations for follow up therapy are one component of a multi-disciplinary discharge planning process, led by the attending physician.  Recommendations may be updated based on patient status, additional functional criteria and insurance authorization.   Follow Up Recommendations  Outpatient OT    Assistance Recommended at Discharge Intermittent Supervision/Assistance  Patient can return home with the following A little help with walking and/or transfers;A lot of help with bathing/dressing/bathroom;Assistance with cooking/housework;Direct supervision/assist for medications management;Help with stairs or ramp for entrance    Functional Status Assessment  Patient has had a recent decline in their functional status and demonstrates the ability to make significant improvements in function in a reasonable and predictable amount of time.  Equipment Recommendations  None recommended by OT    Recommendations for Other Services       Precautions / Restrictions Precautions Precautions: Fall Restrictions Weight Bearing Restrictions: No      Mobility Bed Mobility Overal bed mobility: Needs  Assistance Bed Mobility: Supine to Sit, Sit to Supine     Supine to sit: Supervision Sit to supine: Min assist   General bed mobility comments: Required min A for RLE to return to supine    Transfers Overall transfer level: Needs assistance Equipment used: None Transfers: Sit to/from Stand Sit to Stand: Min assist           General transfer comment: Min A for balance      Balance Overall balance assessment: Needs assistance Sitting-balance support: No upper extremity supported, Feet supported Sitting balance-Leahy Scale: Good     Standing balance support: No upper extremity supported, Bilateral upper extremity supported Standing balance-Leahy Scale: Poor Standing balance comment: dfficulty maintaining balance without external support                           ADL either performed or assessed with clinical judgement   ADL Overall ADL's : Needs assistance/impaired Eating/Feeding: Set up;Sitting   Grooming: Minimal assistance;Standing   Upper Body Bathing: Minimal assistance;Sitting   Lower Body Bathing: Moderate assistance;Sitting/lateral leans;Sit to/from stand   Upper Body Dressing : Minimal assistance;Sitting   Lower Body Dressing: Moderate assistance;Sitting/lateral leans;Sit to/from stand   Toilet Transfer: Minimal assistance;Moderate assistance;Rolling walker (2 wheels)   Toileting- Clothing Manipulation and Hygiene: Moderate assistance;Sitting/lateral lean;Sit to/from stand       Functional mobility during ADLs: Minimal assistance;Moderate assistance;Rolling walker (2 wheels) General ADL Comments: Limited by weakness, balance deficits, and cognitive deficits     Vision Baseline Vision/History: 0 No visual deficits Ability to See in Adequate Light: 1 Impaired Patient Visual Report: No change from baseline Vision Assessment?: Vision impaired- to be further tested in functional context Additional Comments:  Unable to attend to visual  assessment at this time     Perception     Praxis      Pertinent Vitals/Pain Pain Assessment Pain Assessment: No/denies pain     Hand Dominance Right   Extremity/Trunk Assessment Upper Extremity Assessment Upper Extremity Assessment: RUE deficits/detail RUE Deficits / Details: Weakness and mild tone noted RUE Sensation: decreased light touch RUE Coordination: decreased fine motor;decreased gross motor   Lower Extremity Assessment Lower Extremity Assessment: Defer to PT evaluation   Cervical / Trunk Assessment Cervical / Trunk Assessment: Kyphotic   Communication Communication Communication: Expressive difficulties   Cognition Arousal/Alertness: Awake/alert Behavior During Therapy: WFL for tasks assessed/performed Overall Cognitive Status: History of cognitive impairments - at baseline                                 General Comments: Impulsive with mobility tasks. Requiring safety cues throughout. Highly irritable with questions and having to follow multistep directions     General Comments  VSS on RA    Exercises     Shoulder Instructions      Home Living Family/patient expects to be discharged to:: Private residence Living Arrangements: Parent Available Help at Discharge: Family;Available PRN/intermittently Type of Home: House Home Access: Stairs to enter [employer] of Steps: 3 Entrance Stairs-Rails: None Home Layout: One level     Bathroom Shower/Tub: Teacher, early years/pre: Standard     Home Equipment: None          Prior Functioning/Environment Prior Level of Function : Needs assist             Mobility Comments: Reports she does not usually use AD ADLs Comments: Reports she needs assist from mother with IADL tasks        OT Problem List: Decreased strength;Decreased range of motion;Decreased activity tolerance;Impaired balance (sitting and/or standing);Decreased cognition;Decreased safety  awareness;Decreased coordination;Impaired vision/perception;Decreased knowledge of use of DME or AE;Impaired sensation;Impaired UE functional use      OT Treatment/Interventions: Self-care/ADL training;Therapeutic exercise;Neuromuscular education;Energy conservation;DME and/or AE instruction;Therapeutic activities;Cognitive remediation/compensation;Patient/family education;Balance training;Visual/perceptual remediation/compensation    OT Goals(Current goals can be found in the care plan section) Acute Rehab OT Goals Patient Stated Goal: To go home OT Goal Formulation: With patient Time For Goal Achievement: 04/25/22 Potential to Achieve Goals: Fair ADL Goals Pt Will Perform Grooming: with modified independence;standing Pt Will Perform Lower Body Bathing: with supervision;sitting/lateral leans;sit to/from stand Pt Will Perform Lower Body Dressing: with supervision;sitting/lateral leans;sit to/from stand Pt Will Transfer to Toilet: with modified independence;ambulating Pt Will Perform Toileting - Clothing Manipulation and hygiene: with supervision;sitting/lateral leans;sit to/from stand  OT Frequency: Min 2X/week    Co-evaluation              AM-PAC OT "6 Clicks" Daily Activity     Outcome Measure Help from another person eating meals?: A Little Help from another person taking care of personal grooming?: A Little Help from another person toileting, which includes using toliet, bedpan, or urinal?: A Lot Help from another person bathing (including washing, rinsing, drying)?: A Lot Help from another person to put on and taking off regular upper body clothing?: A Little Help from another person to put on and taking off regular lower body clothing?: A Lot 6 Click Score: 15   End of Session Equipment Utilized During Treatment: Gait belt;Rolling walker (2 wheels) Nurse Communication: Mobility status  Activity Tolerance: Patient tolerated treatment  well Patient left: in bed;with call  bell/phone within reach  OT Visit Diagnosis: Unsteadiness on feet (R26.81);Other abnormalities of gait and mobility (R26.89);Muscle weakness (generalized) (M62.81);Hemiplegia and hemiparesis Hemiplegia - Right/Left: Right Hemiplegia - dominant/non-dominant: Dominant Hemiplegia - caused by: Cerebral infarction                Time: 2081-3887 OT Time Calculation (min): 11 min Charges:  OT General Charges $OT Visit: 1 Visit OT Evaluation $OT Eval Moderate Complexity: 1 Mod  Falisa Lamora H., OTR/L Acute Rehabilitation  Cadance Raus Elane Naveh Rickles 04/11/2022, 10:15 PM

## 2022-04-11 NOTE — ED Notes (Signed)
Pt in room A&O x4. Pt ambulated to restroom with a steady gait. Pt had trouble swallowing pills with speech at bedside. See new order. Pt updated on plan of care.

## 2022-04-11 NOTE — Evaluation (Signed)
Clinical/Bedside Swallow Evaluation Patient Details  Name: Roberta Calderon MRN: [id number] Date of Birth: [birth year]  Today's Date: 04/11/2022 Time: SLP Start Time (ACUTE ONLY): 5809 SLP Stop Time (ACUTE ONLY): 0935 SLP Time Calculation (min) (ACUTE ONLY): 10 min  Past Medical History:  Past Medical History:  Diagnosis Date   Hypertension    Past Surgical History: No past surgical history on file. HPI:  Patient is a 51 y.o. 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 She presented to the ED on 04/10/22 with stroke-like symptoms (expressive aphasia and right sided facial droop). CT head showed small recent left corona radiata perforator infarct, MRI brain showed Small acute infarct posterior limb internal capsule on the left, extensive chronic microvascular ischemia, extensive chronic microhemorrhage in the brain compatible with poorly controlled hypertension. She passed [facility] but had difficulty with mastication of solids and diet was changed to Dys 2 (minced), thin liquids.    Assessment / Plan / Recommendation  Clinical Impression  Patient exhibiting clinical s/s of dysphagia as per this bedside/clinical swallow evaluation. She denies previous swallowing difficulties and per chart review, when she has been seen by SLP for previous admissions, focus has been speech-language and cognition and no previous SLP swallow evaluations found. After adjusting HOB to more upright position, SLP observed patient with thin liquids (water) via straw sips, puree solids when taking pills whole. She exhibited a gulping swallow with thin liquids and SLP suspecting swallow initiation delay. She then did exhibit a delayed throat clearing and delayed cough response. With pills taken whole with puree, patient would wince and indicate she needed more bites of applesauce to help transit pill. Swallow appeared effortful as well. SLP recommending MBS to  objecitvely evaluated swallow function. SLP Visit Diagnosis: Dysphagia, unspecified (R13.10)    Aspiration Risk  Mild aspiration risk    Diet Recommendation Other (Comment) (continue with current diet until MBS completed)   Liquid Administration via: Cup;Straw Medication Administration: Whole meds with puree Supervision: Patient able to self feed;Intermittent supervision to cue for compensatory strategies Compensations: Slow rate;Small sips/bites Postural Changes: Seated upright at 90 degrees    Other  Recommendations Oral Care Recommendations: Oral care BID    Recommendations for follow up therapy are one component of a multi-disciplinary discharge planning process, led by the attending physician.  Recommendations may be updated based on patient status, additional functional criteria and insurance authorization.  Follow up Recommendations Outpatient SLP      Assistance Recommended at Discharge Frequent or constant Supervision/Assistance  Functional Status Assessment Patient has had a recent decline in their functional status and demonstrates the ability to make significant improvements in function in a reasonable and predictable amount of time.  Frequency and Duration min 2x/week  2 weeks       Prognosis Prognosis for Safe Diet Advancement: Good Barriers to Reach Goals: Time post onset      Swallow Study   General Date of Onset: 04/10/22 HPI: Patient is a 51 y.o. 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 She presented to the ED on 04/10/22 with stroke-like symptoms (expressive aphasia and right sided facial droop). CT head showed small recent left corona radiata perforator infarct, MRI brain showed Small acute infarct posterior limb internal capsule on the left, extensive chronic microvascular ischemia, extensive chronic microhemorrhage in the brain compatible with poorly controlled hypertension. She passed  [facility] but had difficulty with mastication of solids and diet  was changed to Dys 2 (minced), thin liquids. Type of Study: Bedside Swallow Evaluation Previous Swallow Assessment: none found Diet Prior to this Study: Dysphagia 2 (chopped);Thin liquids Temperature Spikes Noted: No Respiratory Status: Room air History of Recent Intubation: No Behavior/Cognition: Alert;Cooperative Oral Cavity Assessment: Within Functional Limits Oral Care Completed by SLP: Recent completion by staff Oral Cavity - Dentition: Adequate natural dentition Vision: Functional for self-feeding Self-Feeding Abilities: Able to feed self Patient Positioning: Upright in bed Baseline Vocal Quality: Low vocal intensity Volitional Cough: Weak Volitional Swallow: Able to elicit    Oral/Motor/Sensory Function Overall Oral Motor/Sensory Function: Mild impairment Facial ROM: Reduced right Facial Symmetry: Abnormal symmetry right Facial Strength: Reduced right Lingual ROM: Reduced right;Reduced left Lingual Strength: Reduced Mandible: Impaired   Ice Chips     Thin Liquid Thin Liquid: Impaired Presentation: Straw;Self Fed Oral Phase Impairments: Reduced labial seal;Reduced lingual movement/coordination Pharyngeal  Phase Impairments: Suspected delayed Swallow;Throat Clearing - Delayed;Cough - Delayed    Nectar Thick     Honey Thick     Puree Puree: Impaired Oral Phase Impairments: Reduced labial seal Pharyngeal Phase Impairments: Suspected delayed Swallow;Other (comments) Other Comments: patient grimacing with swallow of puree, took several swallows when taking pills whole with puree   Solid     Solid: Not tested      Sonia Baller, MA, CCC-SLP Speech Therapy

## 2022-04-11 NOTE — ED Notes (Signed)
Pt speech is better now but is still garbled, pt passed swallow screen without coughing and was able to eat saltine crackers without difficulty.

## 2022-04-11 NOTE — Progress Notes (Signed)
Modified Barium Swallow Progress Note  Patient Details  Name: Roberta Calderon MRN: [id number] Date of Birth: [birth year]  Today's Date: 04/11/2022  Modified Barium Swallow completed.  Full report located under Chart Review in the Imaging Section.  Brief recommendations include the following:  Clinical Impression  Patient presents with a mild oropharyngeal dysphagia as per this MBS. She exhibited prolonged mastication and oral transit of mechanical soft solids and purees and with mild oral residuals in left side oral cavity with mechanical soft. Patient was aware of residue and able to clear with lingual sweep. Swallow initiation delay to level of the vallecular sinus occured with puree and mechanical soft solids and delays to the pyriform sinus occured with thin and nectar thick liquids. Flash penetration (PAS 2) occured with nectar thick liquids and deeper penetration that cleared fully (PAS 4) occured during the swallow with thin liquids with no significant difference observed between cup and straw sips. No aspiration observed and no pharyngeal residuals remained after initial swallows. SLP recommending continue with Dys 2 solids, thin liquids at this time but if patient exhibiting excessive coughing, can consider nectar thick liquids.   Swallow Evaluation Recommendations       SLP Diet Recommendations: Dysphagia 2 (Fine chop) solids;Thin liquid   Liquid Administration via: Cup;Straw   Medication Administration: Whole meds with puree   Supervision: Patient able to self feed;Intermittent supervision to cue for compensatory strategies   Compensations: Slow rate;Small sips/bites   Postural Changes: Seated upright at 90 degrees   Oral Care Recommendations: Oral care BID       Sonia Baller, MA, CCC-SLP Speech Therapy

## 2022-04-12 ENCOUNTER — Other Ambulatory Visit (HOSPITAL_COMMUNITY): Payer: Self-pay

## 2022-04-12 MED ORDER — CLOPIDOGREL BISULFATE 75 MG PO TABS
75.0000 mg | ORAL_TABLET | Freq: Every day | ORAL | 0 refills | Status: AC
  Filled 2022-04-12: qty 90, 90d supply, fill #0

## 2022-04-12 MED ORDER — TRAZODONE HCL 50 MG PO TABS
25.0000 mg | ORAL_TABLET | Freq: Every evening | ORAL | 1 refills | Status: DC | PRN
  Filled 2022-04-12: qty 30, 30d supply, fill #0

## 2022-04-12 NOTE — TOC Transition Note (Signed)
Transition of Care [facility]) - CM/SW Discharge Note   Patient Details  Name: Roberta Calderon MRN: [id number] Date of Birth: 07-18-1971  [facility] of [facility]) CM/SW Contact:  Pollie Friar, RN Phone Number: 04/12/2022, 10:55 AM   Clinical Narrative:    Pt is from home with her mother but her daughter checks in on her and assists as needed.  Pt with orders for walker for home. Pt received walker from [place] under their charity program.  Outpatient therapy arranged through [facility] as pt has been there before and would like to attend there again. Information on the AVS. PCP: [place] Medications for home delivered to the room per [place].  CM has provided a cab voucher to get pt home as she has no transportation and no money for transport.  CM completed [employer] application with the patient and will fax it in to [employer].    Final next level of care: OP Rehab Barriers to Discharge: Barriers Unresolved (comment), Inadequate or no insurance   Patient Goals and CMS Choice        Discharge Placement                       Discharge Plan and Services                DME Arranged: Walker rolling DME Agency: [employer] Date DME Agency Contacted: 04/12/22   Representative spoke with at DME Agency: [place]            Social Determinants of Health ([place]) Interventions     Readmission Risk Interventions     No data to display

## 2022-04-12 NOTE — Progress Notes (Signed)
Physical Therapy Treatment Patient Details Name: Roberta Calderon MRN: [id number] DOB: Nov 05, 1970 Today's Date: 04/12/2022   History of Present Illness Pt is a 51 y/o female admitted 8/15 secondary to dysarthria and R facial weakness. MRI showed infarct in posterior limb of internal capsule on the left. PMH includes multiple CVA, HTN, and DM.    PT Comments    Making progress towards functional goals. Ambulating greater distances with min guard assist, notable gait abnormalities but stable with RW for support. Min assist for transfer from bed. Participated in balance training, educated on safety/deficit awareness. Patient will continue to benefit from skilled physical therapy services to further improve independence with functional mobility.    Recommendations for follow up therapy are one component of a multi-disciplinary discharge planning process, led by the attending physician.  Recommendations may be updated based on patient status, additional functional criteria and insurance authorization.  Follow Up Recommendations  Outpatient PT     Assistance Recommended at Discharge Frequent or constant Supervision/Assistance  Patient can return home with the following A little help with walking and/or transfers;A little help with bathing/dressing/bathroom;Assistance with cooking/housework;Help with stairs or ramp for entrance;Assist for transportation   Equipment Recommendations  Rolling walker (2 wheels)    Recommendations for Other Services       Precautions / Restrictions Precautions Precautions: Fall Restrictions Weight Bearing Restrictions: No     Mobility  Bed Mobility Overal bed mobility: Needs Assistance Bed Mobility: Supine to Sit     Supine to sit: Min guard     General bed mobility comments: Min guard for safety, impulsive and close to EOB, cues for awareness.    Transfers Overall transfer level: Needs assistance Equipment used: Rolling walker (2  wheels) Transfers: Sit to/from Stand Sit to Stand: Min assist           General transfer comment: Min assist required x2 from bed for balance and boost, and x2 with min guard from recliner chair. Cues for hand placement and technique.    Ambulation/Gait Ambulation/Gait assistance: Min guard Gait Distance (Feet): 180 Feet Assistive device: Rolling walker (2 wheels) Gait Pattern/deviations: Step-through pattern, Decreased stride length, Decreased dorsiflexion - right, Knee flexed in stance - right Gait velocity: Decreased     General Gait Details: Functional weakness noted on the RLE. Min guard for safety with RW for support demonstrating good control of RW. No buckling noted, Rt knee flexed through gait cycle, reduced clearance of Rt foot but without stumbling showing good compensatory techniques. Cues throughout for safety and symmetry of gait.   Stairs             Wheelchair Mobility    Modified Rankin (Stroke Patients Only) Modified Rankin (Stroke Patients Only) Pre-Morbid Rankin Score: Moderate disability Modified Rankin: Moderately severe disability     Balance Overall balance assessment: Needs assistance Sitting-balance support: No upper extremity supported, Feet supported Sitting balance-Leahy Scale: Good     Standing balance support: No upper extremity supported, During functional activity Standing balance-Leahy Scale: Fair Standing balance comment: Stood for several minutes for balance training with SBA-Min A. Pt impulsively tried to walk away from RW to grab ringing phone with notable instability. Educated on Surveyor, mining.                            Cognition Arousal/Alertness: Awake/alert Behavior During Therapy: WFL for tasks assessed/performed Overall Cognitive Status: History of cognitive impairments - at baseline  General Comments: Impulsive with mobility tasks. Requiring safety cues  throughout.        Exercises Other Exercises Other Exercises: Participated in static balance exercises. Performed standing head turns, nods, trunk rotation with weight shifting, mini squats, and static march, eyes open and closed no UE support CGA-Min Assist. Pt performed heel/toe rock with light UE support on RW.    General Comments        Pertinent Vitals/Pain Pain Assessment Pain Assessment: Faces Faces Pain Scale: Hurts a little bit Pain Location: generalized on left side of body Pain Descriptors / Indicators: Aching, Grimacing Pain Intervention(s): Monitored during session, Repositioned    Home Living                          Prior Function            PT Goals (current goals can now be found in the care plan section) Acute Rehab PT Goals Patient Stated Goal: to get better PT Goal Formulation: With patient Time For Goal Achievement: 04/25/22 Potential to Achieve Goals: Good Progress towards PT goals: Progressing toward goals    Frequency    Min 4X/week      PT Plan Current plan remains appropriate    Co-evaluation              AM-PAC PT "6 Clicks" Mobility   Outcome Measure  Help needed turning from your back to your side while in a flat bed without using bedrails?: None Help needed moving from lying on your back to sitting on the side of a flat bed without using bedrails?: None Help needed moving to and from a bed to a chair (including a wheelchair)?: A Little Help needed standing up from a chair using your arms (e.g., wheelchair or bedside chair)?: A Little Help needed to walk in hospital room?: A Little Help needed climbing 3-5 steps with a railing? : A Little 6 Click Score: 20    End of Session Equipment Utilized During Treatment: Gait belt Activity Tolerance: Patient tolerated treatment well Patient left: with call bell/phone within reach;in chair;with chair alarm set   PT Visit Diagnosis: Unsteadiness on feet (R26.81);Muscle  weakness (generalized) (M62.81)     Time: 1610-9604 PT Time Calculation (min) (ACUTE ONLY): 22 min  Charges:  $Gait Training: 8-22 mins                     Candie Mile, PT    Ellouise Newer 04/12/2022, 10:25 AM

## 2022-04-12 NOTE — Discharge Summary (Signed)
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  She developed expressive aphasia and right-sided facial droop, noticed by her sister and EMS was called and brought to the ER.  In the emergency room hemodynamically stable.  CT scan of the head with a small recent left corona radiata infarct.  Admitted with acute stroke , neurology and rehab assessment.   # Acute ischemic stroke: Left MCA territory stroke.  History of previous strokes. Clinical findings, right facial weakness, right lower extremity weakness causing limping gait, acute onset of speech disturbance.  Varying degree of participation with right upper and lower extremity weakness, dysarthria and right facial droop. CT head findings, small recent left corona radiata infarct.  Chronic microvascular ischemic disease. MRI of the brain, small acute infarct posterior limb internal capsule on the left side.  Extensive chronic microvascular ischemia.  Extensive chronic microhemorrhages in the brain compatible with poorly controlled hypertension. CT angiogram of the head and neck, no intracranial large vessel occlusion or significant stenosis.  No hemodynamically significant stenosis in the neck. 2D echocardiogram, from 6/12 normal ejection fraction.  Severe  asymmetrical left ventricular hypertrophy. Grade 1 diastolic dysfunction.  No intracardiac thrombus. Antiplatelet therapy, allergic to aspirin.  On Plavix monotherapy from home but not taking. Plavix prescribed for long-term medication. LDL 86.  On atorvastatin 80 mg daily. Hemoglobin A1c, 6.7.  On metformin.  Stable. Seen by neurology.  Outpatient neurology follow-up. Seen by PT OT, recommended outpatient PT OT. Significant dysarthria, outpatient speech therapy. Aspiration precautions, dysphagia 2 diet, fine chopped food with thin liquid.   Type 2 diabetes, well controlled with hyperglycemia.  A1c 6.7.  Patient on metformin.  She will resume.    Modifiable risk factors: Patient reported cessation of cocaine use.  UDS negative. Patient is still smoking cigarettes, discussed smoking cessation and patient is motivated. Blood pressures are adequate, inconsistently taking blood pressure medications.  She will go back on her medications. Was advised to take Plavix without interruption.   Discharge Diagnoses:  Principal Problem:   Stroke (cerebrum) [facility]) Active Problems:   Acute CVA (cerebrovascular accident) [facility])    Discharge Instructions  Discharge Instructions     Ambulatory referral to Occupational Therapy   Complete by: As directed    Ambulatory referral to Physical Therapy   Complete by: As directed    Ambulatory referral to Speech Therapy   Complete by: As directed    Diet - low sodium heart healthy   Complete by: As directed    Fine chopped and soft food   Increase activity slowly   Complete by: As directed       Allergies as of 04/12/2022       Reactions   Nsaids Nausea And Vomiting, Rash   Aspirin Hives        Medication List     STOP taking these medications    amLODipine 10 MG tablet Commonly known as: NORVASC   CertaVite/Antioxidants Tabs   empagliflozin  10 MG Tabs tablet Commonly known as: Jardiance       TAKE these medications     atorvastatin 80 MG tablet Commonly known as: Lipitor Take 1 tablet (80 mg total) by mouth once daily.   clopidogrel 75 MG tablet Commonly known as: Plavix Take 1 tablet (75 mg total) by mouth daily.   hydrochlorothiazide 25 MG tablet Commonly known as: HYDRODIURIL Take 1 tablet (25 mg total) by mouth once daily.   losartan 50 MG tablet Commonly known as: COZAAR Take 1 tablet (50 mg total) by mouth once daily.   metFORMIN 500 MG tablet Commonly known as: GLUCOPHAGE Take 500 mg by mouth 2 (two) times daily with a meal.   traZODone 50 MG tablet Commonly known as: DESYREL Take 0.5-1 tablets (25-50 mg total) by mouth at bedtime as needed for sleep.               Durable Medical Equipment  (From admission, onward)           Start     Ordered   04/12/22 0947  For home use only DME Walker rolling  Once       Question Answer Comment  Walker: With [place]   Patient needs a walker to treat with the following condition Stroke [facility])      04/12/22 [place]. Schedule an appointment as soon as possible for a visit in 1 week(s).   Specialty: Rehabilitation Contact information: [address] [place] [id number] [place] [address] [phone]               Allergies  Allergen Reactions   Nsaids Nausea And Vomiting and Rash   Aspirin Hives    Consultations: Neurology   Procedures/Studies: DG Swallowing Func-Speech Pathology  Result Date: 04/11/2022 Table formatting from the original result was not included. Objective Swallowing Evaluation: Type of Study: MBS-Modified Barium Swallow Study  Patient Details Name: Roberta Calderon MRN: [id number] Date of Birth: [birth year] Today's Date: 04/11/2022 Time: SLP Start Time (ACUTE ONLY): 1000 -SLP Stop Time (ACUTE ONLY): 1020 SLP Time Calculation (min) (ACUTE ONLY): 20 min Past Medical History: Past  Medical History: Diagnosis Date  Hypertension  Past Surgical History: No past surgical history on file. HPI: Patient is a 51 y.o. 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 She presented to the ED on 04/10/22 with stroke-like symptoms (expressive aphasia and right sided facial droop). CT head showed small recent left corona radiata perforator infarct, MRI brain showed Small acute infarct posterior limb internal capsule on the left, extensive chronic microvascular ischemia, extensive chronic microhemorrhage in the brain compatible with poorly controlled hypertension. She passed [place] but had difficulty with mastication of solids and diet was changed to Dys 2 (minced), thin liquids.  Subjective: cooperative, awake, alert  Recommendations for follow up therapy are one component of a multi-disciplinary discharge planning process, led by the attending physician.  Recommendations may be updated based on patient status, additional functional criteria and insurance authorization. Assessment / Plan / Recommendation   04/11/2022  12:10 PM Clinical Impressions Clinical Impression Patient presents with a mild oropharyngeal dysphagia as per this MBS. She exhibited prolonged mastication and oral transit of mechanical soft solids and purees and with mild oral residuals in left side oral cavity with mechanical soft. Patient was aware of residue and able  to clear with lingual sweep. Swallow initiation delay to level of the vallecular sinus occured with puree and mechanical soft solids and delays to the pyriform sinus occured with thin and nectar thick liquids. Flash penetration (PAS 2) occured with nectar thick liquids and deeper penetration that cleared fully (PAS 4) occured during the swallow with thin liquids with no significant difference observed between cup and straw sips. No aspiration observed and no pharyngeal residuals remained after initial swallows. SLP  recommending continue with Dys 2 solids, thin liquids at this time but if patient exhibiting excessive coughing, can consider nectar thick liquids. SLP Visit Diagnosis Dysphagia, oropharyngeal phase (R13.12) Impact on safety and function Mild aspiration risk     04/11/2022  12:05 PM Treatment Recommendations Treatment Recommendations Therapy as outlined in treatment plan below     04/11/2022  12:14 PM Prognosis Prognosis for Safe Diet Advancement Good Barriers to Reach Goals Time post onset   04/11/2022  12:05 PM Diet Recommendations SLP Diet Recommendations Dysphagia 2 (Fine chop) solids;Thin liquid Liquid Administration via Cup;Straw Medication Administration Whole meds with puree Compensations Slow rate;Small sips/bites Postural Changes Seated upright at 90 degrees     04/11/2022  12:05 PM Other Recommendations Oral Care Recommendations Oral care BID Follow Up Recommendations Outpatient SLP Assistance recommended at discharge Intermittent Supervision/Assistance Functional Status Assessment Patient has had a recent decline in their functional status and demonstrates the ability to make significant improvements in function in a reasonable and predictable amount of time.   04/11/2022  12:05 PM Frequency and Duration  Speech Therapy Frequency (ACUTE ONLY) min 2x/week Treatment Duration 2 weeks     04/11/2022  11:31 AM Oral Phase Oral Phase Impaired Oral - Nectar Cup Delayed oral transit Oral - Thin Cup Delayed oral transit Oral - Thin Straw Delayed oral transit Oral - Puree Reduced posterior propulsion;Weak lingual manipulation Oral - Mech Soft Impaired mastication;Reduced posterior propulsion;Delayed oral transit;Right pocketing in lateral sulci;Weak lingual manipulation Oral - Pill Reduced posterior propulsion;Delayed oral transit;Weak lingual manipulation    04/11/2022  11:32 AM Pharyngeal Phase Pharyngeal Phase Impaired Pharyngeal- Nectar Cup Delayed swallow initiation-pyriform sinuses;Penetration/Aspiration during  swallow;Reduced airway/laryngeal closure Pharyngeal Material enters airway, remains ABOVE vocal cords then ejected out Pharyngeal- Thin Cup Delayed swallow initiation-pyriform sinuses;Reduced airway/laryngeal closure;Penetration/Aspiration during swallow Pharyngeal Material enters airway, CONTACTS cords and then ejected out;Material enters airway, remains ABOVE vocal cords then ejected out Pharyngeal- Thin Straw Delayed swallow initiation-pyriform sinuses;Reduced airway/laryngeal closure Pharyngeal Material enters airway, CONTACTS cords and then ejected out;Material enters airway, remains ABOVE vocal cords then ejected out Pharyngeal- Puree Delayed swallow initiation-vallecula;Reduced pharyngeal peristalsis Pharyngeal- Mechanical Soft Delayed swallow initiation-vallecula;Reduced pharyngeal peristalsis Pharyngeal- Pill Delayed swallow initiation-vallecula    04/11/2022  11:34 AM Cervical Esophageal Phase  Cervical Esophageal Phase Darryll Capers Sonia Baller, MA, CCC-SLP Speech Therapy                     CT ANGIO HEAD NECK W WO CM  Result Date: 04/10/2022 CLINICAL DATA:  Acute infarct in the posterior limb of the internal capsule on same-day MRI EXAM: CT ANGIOGRAPHY HEAD AND NECK TECHNIQUE: Multidetector CT imaging of the head and neck was performed using the standard protocol during bolus administration of intravenous contrast. Multiplanar CT image reconstructions and MIPs were obtained to evaluate the vascular anatomy. Carotid stenosis measurements (when applicable) are obtained utilizing NASCET criteria, using the distal internal carotid diameter as the denominator. RADIATION DOSE REDUCTION: This exam was performed according to the departmental dose-optimization program which includes  automated exposure control, adjustment of the mA and/or kV according to patient size and/or use of iterative reconstruction technique. CONTRAST:  67m OMNIPAQUE IOHEXOL 350 MG/ML SOLN COMPARISON:  02/13/2022 CTA head neck, correlation is  made with 04/10/2022 CT head FINDINGS: CT HEAD FINDINGS For noncontrast findings, please see same day CT head. CTA NECK FINDINGS Aortic arch: Standard branching. Imaged portion shows no evidence of aneurysm or dissection. No significant stenosis of the major arch vessel origins. Right carotid system: No evidence of dissection, occlusion, or hemodynamically significant stenosis (greater than 50%). Atherosclerotic disease at the bifurcation and in the proximal ICA is not hemodynamically significant. Left carotid system: No evidence of dissection, occlusion, or hemodynamically significant stenosis (greater than 50%). Atherosclerotic disease at the bifurcation and in the proximal ICA is not hemodynamically significant. Vertebral arteries: No evidence of dissection, occlusion, or hemodynamically significant stenosis (greater than 50%). Skeleton: No acute osseous abnormality. Degenerative changes in the cervical spine. Periapical lucencies about several maxillary teeth roots. Other neck: Negative. Upper chest: No focal pulmonary opacity or pleural effusion. Review of the MIP images confirms the above findings CTA HEAD FINDINGS Anterior circulation: Both internal carotid arteries are patent to the termini, with calcifications in the cavernous and supraclinoid segments but without significant stenosis. A1 segments patent. Normal anterior communicating artery. Anterior cerebral arteries are patent to their distal aspects. No M1 stenosis or occlusion. MCA branches perfused and symmetric. Posterior circulation: Vertebral arteries patent to the vertebrobasilar junction without stenosis. Inferior cerebellar arteries patent proximally. Basilar patent to its distal aspect. Superior cerebellar arteries patent proximally. Patent P1 segments. PCAs perfused to their distal aspects without stenosis. The bilateral posterior communicating arteries are not visualized. Venous sinuses: As permitted by contrast timing, patent. Anatomic  variants: None significant. Review of the MIP images confirms the above findings IMPRESSION: 1.  No intracranial large vessel occlusion or significant stenosis. 2.  No hemodynamically significant stenosis in the neck. Electronically Signed   By: AMerilyn BabaM.D.   On: 04/10/2022 19:19   MR BRAIN WO CONTRAST  Result Date: 04/10/2022 CLINICAL DATA:  Acute neuro deficit. History of stroke, hypertension, cocaine abuse. EXAM: MRI HEAD WITHOUT CONTRAST TECHNIQUE: Multiplanar, multiecho pulse sequences of the brain and surrounding structures were obtained without intravenous contrast. COMPARISON:  CT head 04/10/2022.  MRI head 02/04/2022 FINDINGS: Brain: Acute infarct posterior limb internal capsule on the left. No other acute infarct. Extensive chronic microvascular ischemic changes. Chronic lacunar infarcts in the white matter, basal ganglia, thalamus, and pons bilaterally. Chronic ischemia in the brachium pontis bilaterally. Numerous areas of chronic microhemorrhage including the cerebellum, brainstem, basal ganglia and thalamus bilaterally and in the cerebral white matter bilaterally. Pattern consistent with poorly controlled hypertension. Vascular: Normal arterial flow voids Skull and upper cervical spine: No focal lesion. Sinuses/Orbits: Mild mucosal edema paranasal sinuses. Negative orbit Other: None IMPRESSION: Small acute infarct posterior limb internal capsule on the left Extensive chronic microvascular ischemia. Extensive chronic microhemorrhage in the brain compatible with poorly controlled hypertension. Electronically Signed   By: CFranchot GalloM.D.   On: 04/10/2022 17:02   CT HEAD CODE STROKE WO CONTRAST  Result Date: 04/10/2022 CLINICAL DATA:  Code stroke.  Neuro deficit, acute, stroke suspected EXAM: CT HEAD WITHOUT CONTRAST TECHNIQUE: Contiguous axial images were obtained from the base of the skull through the vertex without intravenous contrast. RADIATION DOSE REDUCTION: This exam was performed  according to the departmental dose-optimization program which includes automated exposure control, adjustment of the mA and/or kV according to patient  size and/or use of iterative reconstruction technique. COMPARISON:  None Available. FINDINGS: Brain: Calcific atherosclerosis.Small recent left corona radiata perforator infarct better characterized on prior MRI. Remote lacunar infarcts and additional patchy to chronic microvascular ischemic disease, similar. No hydrocephalus, mass lesion, midline shift, or acute hemorrhage. Vascular: No hyperdense vessel identified. Skull: No acute fracture. Sinuses/Orbits: Largely clear sinuses.  No acute orbital findings. Other: No mastoid effusions. ASPECTS [facility] Stroke Program Early CT Score) total score (0-10 with 10 being normal): 10. IMPRESSION: 1. No evidence of acute large vascular territory infarct or acute hemorrhage. 2. Small recent left corona radiata perforator infarct better characterized on prior MRI. 3. Chronic microvascular ischemic disease and remote infarcts. Code stroke imaging results were communicated on 04/10/2022 at 1:59 pm to provider [facility] via secure text paging. Electronically Signed   By: Margaretha Sheffield M.D.   On: 04/10/2022 13:59   (Echo, Carotid, EGD, Colonoscopy, ERCP)    Subjective: Patient seen in the morning rounds.  No overnight events.  Still has some difficulty with expressing but denies any difficulty swallowing.  She was able to mobilize around with minimal support.  Lives with her mother and feels comfortable going home.  May be a little bit weak from old stroke but denies any other weakness today.   Discharge Exam: Vitals:   04/12/22 0559 04/12/22 0740  BP: 116/82 116/70  Pulse: 79 78  Resp: 16 16  Temp: 98.1 F (36.7 C) 98.1 F (36.7 C)  SpO2: 100% 100%   Vitals:   04/11/22 2044 04/12/22 0100 04/12/22 0559 04/12/22 0740  BP: (!) 134/90 124/79 116/82 116/70  Pulse:   79 78  Resp: '16 14 16 16  '$ Temp: 98.4 F (36.9  C) 98.5 F (36.9 C) 98.1 F (36.7 C) 98.1 F (36.7 C)  TempSrc: Oral Oral Oral Oral  SpO2: 100% 99% 100% 100%  Weight:      Height:        General: Pt is alert, awake, not in acute distress Frail and debilitated. Significant dysarthria present. Mild hemiparesis on the left side. Cardiovascular: RRR, S1/S2 +, no rubs, no gallops Respiratory: CTA bilaterally, no wheezing, no rhonchi Abdominal: Soft, NT, ND, bowel sounds + Extremities: no edema, no cyanosis    The results of significant diagnostics from this hospitalization (including imaging, microbiology, ancillary and laboratory) are listed below for reference.     Microbiology: Recent Results (from the past 240 hour(s))  Resp Panel by RT-PCR (Flu A&B, Covid) Anterior Nasal Swab     Status: None   Collection Time: 04/10/22  1:40 PM   Specimen: Anterior Nasal Swab  Result Value Ref Range Status   SARS Coronavirus 2 by RT PCR NEGATIVE NEGATIVE Final    Comment: (NOTE) SARS-CoV-2 target nucleic acids are NOT DETECTED.  The SARS-CoV-2 RNA is generally detectable in upper respiratory specimens during the acute phase of infection. The lowest concentration of SARS-CoV-2 viral copies this assay can detect is 138 copies/mL. A negative result does not preclude SARS-Cov-2 infection and should not be used as the sole basis for treatment or other patient management decisions. A negative result may occur with  improper specimen collection/handling, submission of specimen other than nasopharyngeal swab, presence of viral mutation(s) within the areas targeted by this assay, and inadequate number of viral copies(<138 copies/mL). A negative result must be combined with clinical observations, patient history, and epidemiological information. The expected result is Negative.  Fact Sheet for Patients:  EntrepreneurPulse.com.au  Fact Sheet for Healthcare Providers:  IncredibleEmployment.be  This test  is no t yet approved or cleared by the [place] and  has been authorized for detection and/or diagnosis of SARS-CoV-2 by FDA under an Emergency Use Authorization (EUA). This EUA will remain  in effect (meaning this test can be used) for the duration of the COVID-19 declaration under Section 564(b)(1) of the Act, 21 U.S.C.section 360bbb-3(b)(1), unless the authorization is terminated  or revoked sooner.       Influenza A by PCR NEGATIVE NEGATIVE Final   Influenza B by PCR NEGATIVE NEGATIVE Final    Comment: (NOTE) The Xpert Xpress SARS-CoV-2/FLU/RSV plus assay is intended as an aid in the diagnosis of influenza from Nasopharyngeal swab specimens and should not be used as a sole basis for treatment. Nasal washings and aspirates are unacceptable for Xpert Xpress SARS-CoV-2/FLU/RSV testing.  Fact Sheet for Patients: EntrepreneurPulse.com.au  Fact Sheet for Healthcare Providers: IncredibleEmployment.be  This test is not yet approved or cleared by the [place] FDA and has been authorized for detection and/or diagnosis of SARS-CoV-2 by FDA under an Emergency Use Authorization (EUA). This EUA will remain in effect (meaning this test can be used) for the duration of the COVID-19 declaration under Section 564(b)(1) of the Act, 21 U.S.C. section 360bbb-3(b)(1), unless the authorization is terminated or revoked.  Performed at [facility], [place] [address] [place], [place] [address]      Labs: BNP (last 3 results) No results for input(s): "BNP" in the last 8760 hours. Basic Metabolic Panel: Recent Labs  Lab 04/10/22 1354  NA 140  140  K 3.9  3.9  CL 109  108  CO2 21*  GLUCOSE 142*  140*  BUN 15  19  CREATININE 0.61  0.60  CALCIUM 9.4   Liver Function Tests: Recent Labs  Lab 04/10/22 1354  AST 27  ALT 22  ALKPHOS 81  BILITOT 0.4  PROT 6.7  ALBUMIN 3.6   No results for input(s): "LIPASE", "AMYLASE" in the  last 168 hours. No results for input(s): "AMMONIA" in the last 168 hours. CBC: Recent Labs  Lab 04/10/22 1354  WBC 6.4  NEUTROABS 3.6  HGB 11.6*  12.9  HCT 38.2  38.0  MCV 80.3  PLT 340   Cardiac Enzymes: No results for input(s): "CKTOTAL", "CKMB", "CKMBINDEX", "TROPONINI" in the last 168 hours. BNP: Invalid input(s): "POCBNP" CBG: Recent Labs  Lab 04/10/22 1737 04/11/22 0338 04/11/22 0735 04/11/22 1227  GLUCAP 93 114* 100* 84   D-Dimer No results for input(s): "DDIMER" in the last 72 hours. Hgb A1c Recent Labs    04/11/22 1420  HGBA1C 6.3*   Lipid Profile Recent Labs    04/11/22 0258  CHOL 149  HDL 46  LDLCALC 86  TRIG 87  CHOLHDL 3.2   Thyroid function studies No results for input(s): "TSH", "T4TOTAL", "T3FREE", "THYROIDAB" in the last 72 hours.  Invalid input(s): "FREET3" Anemia work up No results for input(s): "VITAMINB12", "FOLATE", "FERRITIN", "TIBC", "IRON", "RETICCTPCT" in the last 72 hours. Urinalysis    Component Value Date/Time   COLORURINE YELLOW 04/10/2022 1338   APPEARANCEUR CLOUDY (A) 04/10/2022 1338   LABSPEC 1.021 04/10/2022 1338   PHURINE 5.0 04/10/2022 1338   GLUCOSEU NEGATIVE 04/10/2022 1338   HGBUR NEGATIVE 04/10/2022 [place] 04/10/2022 1338   KETONESUR NEGATIVE 04/10/2022 1338   PROTEINUR NEGATIVE 04/10/2022 1338   NITRITE NEGATIVE 04/10/2022 1338   LEUKOCYTESUR TRACE (A) 04/10/2022 1338   Sepsis Labs Recent Labs  Lab 04/10/22 1354  WBC 6.4   Microbiology Recent Results (from the past 240 hour(s))  Resp Panel by RT-PCR (Flu A&B, Covid) Anterior Nasal Swab     Status: None   Collection Time: 04/10/22  1:40 PM   Specimen: Anterior Nasal Swab  Result Value Ref Range Status   SARS Coronavirus 2 by RT PCR NEGATIVE NEGATIVE Final    Comment: (NOTE) SARS-CoV-2 target nucleic acids are NOT DETECTED.  The SARS-CoV-2 RNA is generally detectable in upper respiratory specimens during the acute phase of  infection. The lowest concentration of SARS-CoV-2 viral copies this assay can detect is 138 copies/mL. A negative result does not preclude SARS-Cov-2 infection and should not be used as the sole basis for treatment or other patient management decisions. A negative result may occur with  improper specimen collection/handling, submission of specimen other than nasopharyngeal swab, presence of viral mutation(s) within the areas targeted by this assay, and inadequate number of viral copies(<138 copies/mL). A negative result must be combined with clinical observations, patient history, and epidemiological information. The expected result is Negative.  Fact Sheet for Patients:  EntrepreneurPulse.com.au  Fact Sheet for Healthcare Providers:  IncredibleEmployment.be  This test is no t yet approved or cleared by the [place] FDA and  has been authorized for detection and/or diagnosis of SARS-CoV-2 by FDA under an Emergency Use Authorization (EUA). This EUA will remain  in effect (meaning this test can be used) for the duration of the COVID-19 declaration under Section 564(b)(1) of the Act, 21 U.S.C.section 360bbb-3(b)(1), unless the authorization is terminated  or revoked sooner.       Influenza A by PCR NEGATIVE NEGATIVE Final   Influenza B by PCR NEGATIVE NEGATIVE Final    Comment: (NOTE) The Xpert Xpress SARS-CoV-2/FLU/RSV plus assay is intended as an aid in the diagnosis of influenza from Nasopharyngeal swab specimens and should not be used as a sole basis for treatment. Nasal washings and aspirates are unacceptable for Xpert Xpress SARS-CoV-2/FLU/RSV testing.  Fact Sheet for Patients: EntrepreneurPulse.com.au  Fact Sheet for Healthcare Providers: IncredibleEmployment.be  This test is not yet approved or cleared by the [place] FDA and has been authorized for detection and/or diagnosis of SARS-CoV-2  by FDA under an Emergency Use Authorization (EUA). This EUA will remain in effect (meaning this test can be used) for the duration of the COVID-19 declaration under Section 564(b)(1) of the Act, 21 U.S.C. section 360bbb-3(b)(1), unless the authorization is terminated or revoked.  Performed at [facility], [place] [address] [place], [place] [address]      Time coordinating discharge: 35 minutes  SIGNED:   Barb Merino, MD  [facility] 04/12/2022, 11:14 AM

## 2022-04-12 NOTE — Progress Notes (Signed)
Speech Language Pathology Treatment: Dysphagia  Patient Details Name: Roberta Calderon MRN: [id number] DOB: [birth year] Today's Date: 04/12/2022 Time: 5093-2671 SLP Time Calculation (min) (ACUTE ONLY): 10 min  Assessment / Plan / Recommendation Clinical Impression  Patient seen by SLP for skilled treatment focused on dysphagia goals. Patient is to be discharged home today. When SLP arrived, she was sitting in recliner, awake and alert. Speech remains dysarthric but improved initiation and continuous phonation during connected speech. When asked about her swallowing, she reported that with solids it was "difficult" to swallow and with liquids "I cough". SLP then thickened a cranberry juice to nectar thick. Patient drank this via cup sips and she reported that it felt better than thin. She asked if she could get some nectar thickener packets. SLP provided patient with handouts describing softer solids diets as well as a starter pack of nectar thick (Simply Thick brand) thickener. SLP continues to recommend OP SLP services and patient in agreement.    HPI HPI: Patient is a 51 y.o. 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 She presented to the ED on 04/10/22 with stroke-like symptoms (expressive aphasia and right sided facial droop). CT head showed small recent left corona radiata perforator infarct, MRI brain showed Small acute infarct posterior limb internal capsule on the left, extensive chronic microvascular ischemia, extensive chronic microhemorrhage in the brain compatible with poorly controlled hypertension. She passed [facility] but had difficulty with mastication of solids and diet was changed to Dys 2 (minced), thin liquids.      SLP Plan  Discharge SLP treatment due to (comment) (patient discharging home, recommendations for OP SLP)      Recommendations for follow up therapy are one component of a multi-disciplinary discharge  planning process, led by the attending physician.  Recommendations may be updated based on patient status, additional functional criteria and insurance authorization.    Recommendations  Diet recommendations: Dysphagia 2 (fine chop);Thin liquid;Nectar-thick liquid Liquids provided via: Cup Medication Administration: Whole meds with puree Supervision: Patient able to self feed Compensations: Slow rate;Small sips/bites Postural Changes and/or Swallow Maneuvers: Seated upright 90 degrees                Oral Care Recommendations: Oral care BID Follow Up Recommendations: Outpatient SLP Assistance recommended at discharge: Set up Supervision/Assistance SLP Visit Diagnosis: Dysphagia, oropharyngeal phase (R13.12) Plan: Discharge SLP treatment due to (comment) (patient discharging home, recommendations for OP SLP)          Sonia Baller, MA, CCC-SLP Speech Therapy

## 2022-04-17 ENCOUNTER — Telehealth: Payer: Self-pay

## 2022-04-17 NOTE — Telephone Encounter (Signed)
Transition Care Management Follow-up Telephone Call Date of discharge and from where: 04/12/2022, [facility]  How have you been since you were released from the hospital? Speech dysarthric.  She stated she is weak and needs help at home. She stays with her mother but her mother works.  She was tearful at times on the phone. I explained that without insurance, we do not have resources for home health, PCS.  She said she has family planning Medicaid and she spoke to a person at [facility] when she was hospitalized and that person is helping her apply for full Medicaid She may benefit from a referral to [facility] if she does not receive assistance from [facility] with applying for Medicaid Any questions or concerns? Yes- noted above  Items Reviewed: Did the pt receive and understand the discharge instructions provided? Yes  Medications obtained and verified? Yes - she said she has all of her medications and has been taking them as ordered Other? No  Any new allergies since your discharge? No  Dietary orders reviewed? Yes- she is thickening her liquids and said she takes her medications with applesauce.  Do you have support at home? Yes - but she said she needs more help  Home Care and Equipment/Supplies: Were home health services ordered? no If so, what is the name of the agency? N/a  Has the agency set up a time to come to the patient's home? not applicable Were any new equipment or medical supplies ordered?  Yes: RW What is the name of the medical supply agency? [facility]  Were you able to get the supplies/equipment? yes Do you have any questions related to the use of the equipment or supplies? No  Functional Questionnaire: (I = Independent and D = Dependent) ADLs: ambulates with RW.  She said she needs assistance with ADLs but her mother is not always there to help her.   She starts outpatient neurorehab, PT/OT/ST  04/18/2022  Follow up appointments reviewed:  [facility]  f/u appt confirmed? Yes  Scheduled to see Juluis Mire, NP - 05/07/2022.  [facility] f/u appt confirmed?  None scheduled yet.    Are transportation arrangements needed? Yes - she said that the hospital SW helped her complete a [employer] application. She contacted [employer] today to arrange transportation to her upcoming outpatient therapy appointments.  She was not sure if she was approved for services yet. I instructed her to call me if she does not have transportation to her appointment with PCP on 05/07/2022 and the clinic will arrange cab transportation for her.  If their condition worsens, is the pt aware to call PCP or go to the Emergency Dept.? Yes Was the patient provided with contact information for the PCP's office or ED? Yes Was to pt encouraged to call back with questions or concerns? Yes

## 2022-04-18 ENCOUNTER — Encounter: Payer: Self-pay | Admitting: Speech Pathology

## 2022-04-18 ENCOUNTER — Ambulatory Visit: Payer: Self-pay

## 2022-04-25 ENCOUNTER — Encounter: Payer: Self-pay | Admitting: Speech Pathology

## 2022-04-25 ENCOUNTER — Ambulatory Visit: Payer: Medicaid Other | Attending: Primary Care | Admitting: Speech Pathology

## 2022-04-25 ENCOUNTER — Ambulatory Visit: Payer: Medicaid Other

## 2022-04-25 ENCOUNTER — Other Ambulatory Visit: Payer: Self-pay

## 2022-04-25 DIAGNOSIS — R278 Other lack of coordination: Secondary | ICD-10-CM | POA: Insufficient documentation

## 2022-04-25 DIAGNOSIS — R4701 Aphasia: Secondary | ICD-10-CM | POA: Diagnosis present

## 2022-04-25 DIAGNOSIS — R2689 Other abnormalities of gait and mobility: Secondary | ICD-10-CM | POA: Insufficient documentation

## 2022-04-25 DIAGNOSIS — M6281 Muscle weakness (generalized): Secondary | ICD-10-CM | POA: Diagnosis present

## 2022-04-25 DIAGNOSIS — R471 Dysarthria and anarthria: Secondary | ICD-10-CM | POA: Insufficient documentation

## 2022-04-25 DIAGNOSIS — R1312 Dysphagia, oropharyngeal phase: Secondary | ICD-10-CM | POA: Diagnosis present

## 2022-04-25 NOTE — Therapy (Signed)
OUTPATIENT SPEECH LANGUAGE PATHOLOGY APHASIA EVALUATION   Patient Name: Roberta Calderon MRN: [id number] DOB:09-18-1970, 51 y.o., female Today's Date: 04/25/2022  PCP: Kerin Perna, NP REFERRING PROVIDER: Barb Merino, MD    End of Session - 04/25/22 1232     Visit Number 1    Number of Visits 10    Date for SLP Re-Evaluation 06/20/22    Authorization Type medicaid family planning - ST not covered    Authorization Time Period she is awaiting medicaid card    SLP Start Time 6    SLP Stop Time  1315    SLP Time Calculation (min) 45 min    Activity Tolerance Patient tolerated treatment well             Past Medical History:  Diagnosis Date   Hypertension    History reviewed. No pertinent surgical history. Patient Active Problem List   Diagnosis Date Noted   Stroke (cerebrum) ([place]) 04/10/2022   Acute CVA (cerebrovascular accident) ([place]) 02/05/2022   Cerebrovascular accident (CVA) of right basal ganglia ([place]) 08/14/2021   Hypertensive urgency 08/14/2021   Cocaine abuse ([place]) 96/78/9381   Metabolic syndrome 01/75/1025   Right sided weakness 08/14/2021   Alcohol abuse 08/14/2021   Prolonged QT interval 08/14/2021   ICH (intracerebral hemorrhage) ([place]) 02/07/2021   Dyslipidemia    Right thalamic infarction ([place]) 04/08/2020   Prediabetes    Polysubstance abuse ([place])    Hemiparesis affecting left side as late effect of stroke ([place])    Ischemic stroke ([place]) 04/06/2020   Hypertension 04/06/2020   Tobacco use disorder 04/06/2020    ONSET DATE: 04/10/22   REFERRING DIAG: I63.9 (ICD-10-CM) - Cerebrovascular accident (CVA), unspecified mechanism ([place])   THERAPY DIAG:  Dysarthria and anarthria  Dysphagia, oropharyngeal phase  Aphasia  Rationale for Evaluation and Treatment Rehabilitation  SUBJECTIVE:   SUBJECTIVE STATEMENT: "I had 4 strokes" Pt accompanied by: self  PERTINENT HISTORY: Patient is a 51 y.o. female with PMH: multiple lacunar strokes  with residual right-sided weakness, uncontrolled HTN, h/o cocaine abuse. She presented to the ED on 04/10/22 with stroke-like symptoms (expressive aphasia and right sided facial droop). CT head showed small recent left corona radiata perforator infarct, MRI brain showed Small acute infarct posterior limb internal capsule on the left, extensive chronic microvascular ischemia, extensive chronic microhemorrhage in the brain compatible with poorly controlled hypertension. She passed [facility] but had difficulty with mastication of solids and diet was changed to Dys 2 (minced), thin liquids.       PAIN:  Are you having pain? No  FALLS: Has patient fallen in last 6 months?  Yes, Number of falls: 3, See PT evaluation for details  LIVING ENVIRONMENT: Lives with: lives with their family Lives in: House/apartment  PLOF:  Level of assistance: Independent with ADLs, Independent with IADLs Employment: On disability   PATIENT GOALS "To get it together and talk better"  OBJECTIVE:   DIAGNOSTIC FINDINGS: MBSS 04/11/22: Patient presents with a mild oropharyngeal dysphagia as per this MBS. She exhibited prolonged mastication and oral transit of mechanical soft solids and purees and with mild oral residuals in left side oral cavity with mechanical soft. Patient was aware of residue and able to clear with lingual sweep. Swallow initiation delay to level of the vallecular sinus occured with puree and mechanical soft solids and delays to the pyriform sinus occured with thin and nectar thick liquids. Flash penetration (PAS 2) occured with nectar thick liquids and deeper penetration that cleared fully (PAS  4) occured during the swallow with thin liquids with no significant difference observed between cup and straw sips. No aspiration observed and no pharyngeal residuals remained after initial swallows. SLP recommending continue with Dys 2 solids, thin liquids at this time but if patient exhibiting excessive coughing, can  consider nectar thick liquids. SLP Visit Diagnosis Dysphagia, oropharyngeal phase (R13.12)  COGNITION: Overall cognitive status: Impaired Areas of impairment:  Memory: Impaired: Short term  AUDITORY COMPREHENSION: Overall auditory comprehension: Appears intact YES/NO questions: Appears intact Following directions: Appears intact Conversation: Moderately Complex Interfering components:  none    READING COMPREHENSION: Intact  EXPRESSION: verbal  VERBAL EXPRESSION: Level of generative/spontaneous verbalization: conversation Automatic speech: counting: intact, day of week: intact, and month of year: intact  Repetition: Appears intact Naming: Confrontation: 76-100% and Divergent: 51-75% Pragmatics: Appears intact Comments: tearful during divergent naming Interfering components:  none Effective technique: semantic cues and phonemic cues Non-verbal means of communication: N/A  WRITTEN EXPRESSION: Dominant hand: right  Written expression: Not tested  MOTOR SPEECH: Overall motor speech: impaired Level of impairment: Phrase Respiration: thoracic breathing Phonation: hoarse and strained Resonance: WFL Articulation: Impaired: sentence Intelligibility: Intelligibility reduced Motor planning: Appears intact Motor speech errors: aware Interfering components:  none Effective technique: slow rate, over articulate, and pause   ORAL MOTOR EXAMINATION Overall status: Impaired:   Labial: Left (ROM, Strength, and Coordination) Lingual: Right (Coordination) Comments:   STANDARDIZED ASSESSMENTS: QAB - Quick Aphasia Battery - WNL   PATIENT REPORTED OUTCOME MEASURES (PROM): Communication Participation Item Bank: 13/30 - She rated a score of 1 or "quite a bit of difficulty"communicating quickly, asking questions, talking in a group, having a long conversation, giving detailed information, getting a turn in a fast moving conversation   TODAY'S TREATMENT:  Initiated training in  HEP for dysarthria/voice. She was stimulable for clear phonation with flow phonation strategies   PATIENT EDUCATION: Education details: See treatment and patient instructions Person educated: Patient Education method: Explanation, Demonstration, Verbal cues, and Handouts Education comprehension: verbalized understanding, returned demonstration, verbal cues required, and needs further education   GOALS: Goals reviewed with patient? Yes  LONG TERM GOALS: Target date: 06/20/22  Pt will complete HEP for dysarthria/voice with rare min A Baseline: noHEP Goal status: INITIAL  2.  Pt will name 12 items for a personally relevant category  Baseline: named 8, (15-20 is WNL) Goal status: INITIAL  3.  Pt will carryover compensations for  dysarthria to report reduction in need to repeat by 50% subjectively Baseline: not using compensations Goal status: INITIAL  4.  Pt will demonstrate clear phonation over 8 minute conversation with occasional min A Baseline: phonation hoarse and strained Goal status: INITIAL  5.  Pt will improve score on PROM by 3 points Baseline: 13/30 Goal status: INITIAL 6. Pt will follow diet modifications and swallow precautions with mod I Baseline: not following precautions Goal status: Initial  ASSESSMENT:  CLINICAL IMPRESSION: Patient is a 51 y.o. female who was seen today for dysarthria, aphasia, and dysphagia. She had a 4th CVA 04/10/22. Since then she reports decreased word finding and intelligibility. Roberta Calderon reports that she has to repeat herself frequently to her mom and healthcare providers. Today she presents with mild dysarthria, oropharyngeal dysphagia and mild high level word finding impairment. Speech is slurred with labial weakness and Her voice is strained and hoarse which also affects intelligibility. MBSS recommended Dys 2 solids. Roberta Calderon has self advanced to regular solids but endorses onging coughing and choking with PO. PO trials today of  thin  liquid only revealed no overt s/s of aspiration. Will continue to assess swallow. She reports word finding difficulties as well. QAB WNL, however Roberta Calderon named 8 animals in 1 minute (15-20 is WNL). At this time, I recommend skilled ST to maximize intelligibility, safety of swallow and verbal expression for safety, independence and QOL.   OBJECTIVE IMPAIRMENTS include memory, expressive language, dysarthria, voice disorder, and dysphagia. These impairments are limiting patient from effectively communicating at home and in community and safety when swallowing. Factors affecting potential to achieve goals and functional outcome are previous level of function and transportation . Patient will benefit from skilled SLP services to address above impairments and improve overall function.  REHAB POTENTIAL: Fair lack of transportation, history of inconsistent attendance in prior courses of therpay  PLAN: SLP FREQUENCY: 1x/week  SLP DURATION: 8 weeks  PLANNED INTERVENTIONS: Aspiration precaution training, Pharyngeal strengthening exercises, Diet toleration management , Language facilitation, Environmental controls, Trials of upgraded texture/liquids, Cueing hierachy, Cognitive reorganization, Internal/external aids, Functional tasks, and Multimodal communication approach, repeat MBSS if indicated    Konni Kesinger, Annye Rusk, [place] 04/25/2022, 3:16 PM

## 2022-04-25 NOTE — Patient Instructions (Signed)
   HHAAAAAA - let air out - 10x if  it is strained, stop  [facility] with tissue - watch tissue move has your air comes out  20 30 40 50 60 70 80 90 100  Focus on making tissue move with air    You have tension in your larynx limiting the air flow when you speak, resulting in a hoarse or gravelly voice. We call this "pressed speech"   We use flow phonation to improve proper air flow during speech. Sounds  that promote air flow include: s, z, sh, th, f, v, ch. We call this "flow speech"  After you complete the semi-occluded vocal tract exercises (straw exercises & gargling), complete the following twice a day  Use a tissue to focus on airflow:  Whooo 10x Shoe 10x Sue 10x  Shoe-Fu Sue-Pu He- She Fu-Sue Pu-Lu   Who are you?  Who is Collie Siad?   Twenty Thirty  Forty Fifty Sixty Seventy Eighty Ninety Hundred  She sells sea shells  See Sue's shoes  Fifty-Fifty  Hit the hammer  High school hero  Hocus Pocus  To each his own  Zebra's zig zag at the zoo  [employer] chews [place] moms choose Mazie prefers french fries  Vince vowed to vote  Feel the furry fish  She should polish her shoes  Show them the fresh fruit  Teachers eat ripe peaches at [employer]  Not now nor never  My mama makes me muffins  No one knows Norman's nickname  See Gay Filler Sleep soundly by the sea

## 2022-04-25 NOTE — Therapy (Signed)
OUTPATIENT PHYSICAL THERAPY NEURO EVALUATION   Patient Name: Roberta Calderon MRN: [id number] DOB:1971-04-19, 51 y.o., female Today's Date: 04/25/2022   PCP: Juluis Mire, NP  REFERRING PROVIDER: Barb Merino, MD   PT End of Session - 04/25/22 1128     Visit Number 1    Number of Visits 9    Date for PT Re-Evaluation 06/20/22    Authorization Type Self pay    PT Start Time 1318    PT Stop Time 1348   patient picking up her belongings and starting to walk out early   PT Time Calculation (min) 30 min    Equipment Utilized During Treatment Gait belt    Activity Tolerance Patient tolerated treatment well    Behavior During Therapy WFL for tasks assessed/performed             Past Medical History:  Diagnosis Date   Hypertension    History reviewed. No pertinent surgical history. Patient Active Problem List   Diagnosis Date Noted   Stroke (cerebrum) ([place]) 04/10/2022   Acute CVA (cerebrovascular accident) ([place]) 02/05/2022   Cerebrovascular accident (CVA) of right basal ganglia ([place]) 08/14/2021   Hypertensive urgency 08/14/2021   Cocaine abuse ([place]) 98/92/1194   Metabolic syndrome 17/40/8144   Right sided weakness 08/14/2021   Alcohol abuse 08/14/2021   Prolonged QT interval 08/14/2021   ICH (intracerebral hemorrhage) ([place]) 02/07/2021   Dyslipidemia    Right thalamic infarction ([place]) 04/08/2020   Prediabetes    Polysubstance abuse ([place])    Hemiparesis affecting left side as late effect of stroke ([place])    Ischemic stroke ([place]) 04/06/2020   Hypertension 04/06/2020   Tobacco use disorder 04/06/2020    ONSET DATE: 04/10/22  REFERRING DIAG: Y18.563 (ICD-10-CM) - 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 Notable changes in speech and overall demeanor since previous PT bout.  Pt accompanied by: self  PERTINENT HISTORY: multiple lacunar strokes with residual mild right-sided weakness, uncontrolled hypertension, history of cocaine abuse, current smoker    PAIN:  Are you having pain? No  PRECAUTIONS: Fall  WEIGHT BEARING RESTRICTIONS No  FALLS: Has patient fallen in last  6 months? Yes. Number of falls 3 patient tripped on her porch not using her walker over her slides, fell getting out of bed not using RW; patient reports hitting her head last week (denies LOC)   LIVING ENVIRONMENT: Lives with:  mom Lives in: House/apartment Stairs: No Has following equipment at home: Environmental consultant - 2 wheeled  PLOF:  patient previously reporting that she required help with all ADLs- at todays appt, patient reporting that she was completely independent with all ADLs prior to this last stroke  PATIENT GOALS "get myself back strong"   OBJECTIVE:  VITALS: BP: 141/80   DIAGNOSTIC FINDINGS: MRI 04/10/22: Small acute infarct posterior limb internal capsule on the left  COGNITION: Overall cognitive status: Impaired   SENSATION: Reports N/T in L hand   COORDINATION: Dysmetric and increased difficulty with L LE figure 8   EDEMA:  Patient reports swelling to B ankles, but non apparent on eval   POSTURE: No Significant postural limitations   LOWER EXTREMITY MMT:    MMT Right Eval Left Eval  Hip flexion 4 3+  Hip extension    Hip abduction 4 4  Hip adduction 4 4  Hip internal rotation    Hip external rotation    Knee flexion 3+ 3+  Knee extension 4 4  Ankle dorsiflexion 4 4  Ankle plantarflexion 4 4  Ankle inversion    Ankle eversion    (Blank rows = not  tested)  BED MOBILITY:  Patient reports difficulty, stating "I just need to take my time." Patient stating "I just need help" but didn't clarify further what aspects of getting out of bed were difficult despite further questioning.  TRANSFERS: Assistive device utilized: None  Sit to stand: SBA Stand to sit: SBA Chair to chair: SBA   GAIT: Gait pattern: step through pattern, decreased arm swing- Right, decreased arm swing- Left, Right foot flat, Left foot flat, lateral hip instability, wide BOS, poor foot clearance- Right, and poor foot clearance- Left Distance walked: clinic Assistive device utilized: None Level of assistance: CGA   FUNCTIONAL TESTs:   [facility] PT Assessment - 04/25/22 0001       Standardized Balance Assessment   Standardized Balance Assessment 10 meter walk test    Five times sit to stand comments  16.56   8/10 fatigue   10 Meter Walk .102ms      Timed Up and Go Test   Normal TUG (seconds) 15.59      Functional Gait  Assessment   Gait assessed  Yes    Gait Level Surface Walks 20 ft, slow speed, abnormal gait pattern, evidence for imbalance or deviates 10-15 in outside of the 12 in walkway width. Requires more than 7 sec to ambulate 20 ft.    Change in Gait Speed Makes only minor adjustments to walking speed, or accomplishes a change in speed with significant gait deviations, deviates 10-15 in outside the 12 in walkway width, or changes speed but loses balance but is able to recover and continue walking.    Gait with Horizontal Head Turns Performs head turns with moderate changes in gait velocity, slows down, deviates 10-15 in outside 12 in walkway width but recovers, can continue to walk.    Gait with Vertical Head Turns Performs task with moderate change in gait velocity, slows down, deviates 10-15 in outside 12 in walkway width but recovers, can continue to walk.    Gait and Pivot Turn Turns slowly, requires verbal cueing, or requires several small steps  to catch  balance following turn and stop    Step Over Obstacle Is able to step over one shoe box (4.5 in total height) but must slow down and adjust steps to clear box safely. May require verbal cueing.    Gait with Narrow Base of Support Ambulates less than 4 steps heel to toe or cannot perform without assistance.    Gait with Eyes Closed Walks 20 ft, slow speed, abnormal gait pattern, evidence for imbalance, deviates 10-15 in outside 12 in walkway width. Requires more than 9 sec to ambulate 20 ft.    Ambulating Backwards Walks 20 ft, slow speed, abnormal gait pattern, evidence for imbalance, deviates 10-15 in outside 12 in walkway width.    Steps Two feet to a stair, must use rail.    Total Score 9             TODAY'S TREATMENT:  N/A eval    PATIENT EDUCATION: Education details: PT POC, OM results, wearing appropriate shoes (sneakers/closed toes with backs) to PT and in general Person educated: Patient Education method: Explanation Education comprehension: verbalized understanding and needs further education   HOME EXERCISE PROGRAM: To be provided     GOALS: Goals reviewed with patient? Yes  SHORT TERM GOALS: Target date: 05/23/2022  Pt will be independent with initial HEP for improved balance  Baseline: to be provided Goal status: INITIAL  2.  Pt will improve gait speed to >/= 1.41ms with LRAD to demonstrate improved community ambulation  Baseline: 0.8528m with no AD and CGA Goal status: INITIAL  3.  Pt will improve FGA to >/= 14/30 to demonstrate improved balance and reduced fall risk  Baseline: 9/30 Goal status: INITIAL  4.  Pt will improve 5x STS to </= 12 sec to demo improved functional LE strength and balance   Baseline: 16.56s with B UE and CGA Goal status: INITIAL  5.  Pt will improve TUG to </= 12 secs to demonstrated reduced fall risk  Baseline: 15.59s with no AD and CGA Goal status: INITIAL    LONG TERM GOALS: Target date: 06/20/2022  Pt will be  independent with final HEP for improved balance and functional strength   Baseline: to be provided Goal status: INITIAL  2.  Pt will improve gait speed to >/= 1.20m56mwith LRAD to demonstrate improved community ambulation  Baseline: 0.28m/7mith no AD and CGA Goal status: INITIAL  3.  Pt will improve FGA to >/= 19/30 to demonstrate improved balance and reduced fall risk  Baseline: 9/30 Goal status: INITIAL  4.  Pt will improve TUG to </= 9 secs with LRAD to demonstrated reduced fall risk  Baseline: 15.59s with no AD and CGA Goal status: INITIAL  5.  Pt will improve 5x STS to </= 9 sec with LRAD to demo improved functional LE strength and balance   Baseline: 16.56s with B UE Goal status: INITIAL  ASSESSMENT:  CLINICAL IMPRESSION: Patient is a 51 y58. female who was seen today for physical therapy evaluation and treatment for impaired balance and functional strength related to most recent CVA. Patient completed the Timed Up and Go test (TUG) in 15.59s seconds.  Geriatrics: need for further assessment of fall risk: ? 12 sec; Recurrent falls: > 15 sec; Vestibular Disorders fall risk: > 15 sec; Parkinson's Disease fall risk: > 16 sec ([email or web address]). Patient demonstrates increased fall risk as noted by score of 9/30 on  Functional Gait Assessment.   <22/30 = predictive of falls, <20/30 = fall  in 6 months, <18/30 = predictive of falls in PD MCID: 5 points stroke population, 4 points geriatric population (ANPTA Core Set of Outcome Measures for Adults with Neurologic Conditions, 2018). 10 Meter Walk Test: Patient instructed to walk 10 meters (32.8 ft) as quickly and as safely as possible at their normal speed x2 and at a fast speed x2. Time measured from 2 meter mark to 8 meter mark to accommodate ramp-up and ramp-down.  Normal speed: 0.57ms. Cut off scores: <0.4 m/s = household Ambulator, 0.4-0.8 m/s = limited community Ambulator, >0.8 m/s = community Ambulator, >1.2 m/s = crossing a  street, <1.0 = increased fall risk MCID 0.05 m/s (small), 0.13 m/s (moderate), 0.06 m/s (significant)  (ANPTA Core Set of Outcome Measures for Adults with Neurologic Conditions, 2018). Five times Sit to Stand Test (FTSS) Method: Use a straight back chair with a solid seat that is 17-18" high. Ask participant to sit on the chair with arms folded across their chest.   Instructions: "Stand up and sit down as quickly as possible 5 times, keeping your arms folded across your chest."   Measurement: Stop timing when the participant touches the chair in sitting the 5th time.  TIME: 16.56 sec  Cut off scores indicative of increased fall risk: >12 sec CVA, >16 sec PD, >13 sec vestibular (ANPTA Core Set of Outcome Measures for Adults with Neurologic Conditions, 2018). Patient would benefit from skilled PT in order to address the above mentioned deficits.   OBJECTIVE IMPAIRMENTS Abnormal gait, cardiopulmonary status limiting activity, decreased activity tolerance, decreased balance, decreased cognition, decreased coordination, decreased endurance, decreased knowledge of condition, decreased knowledge of use of DME, decreased mobility, difficulty walking, decreased strength, decreased safety awareness, and impaired vision/preception.   ACTIVITY LIMITATIONS carrying, lifting, bending, standing, squatting, stairs, transfers, bed mobility, bathing, dressing, hygiene/grooming, locomotion level, and caring for others  PARTICIPATION LIMITATIONS: meal prep, cleaning, laundry, medication management, personal finances, interpersonal relationship, driving, shopping, and community activity  PERSONAL FACTORS Behavior pattern, Education, Past/current experiences, Social background, Transportation, and 3+ comorbidities: multiple CVAs, substance abuse, HTN  are also affecting patient's functional outcome.   REHAB POTENTIAL: Fair h/o multiple CVAs  CLINICAL DECISION MAKING: Evolving/moderate complexity  EVALUATION  COMPLEXITY: Moderate  PLAN: PT FREQUENCY: 1x/week  PT DURATION: 8 weeks  PLANNED INTERVENTIONS: Therapeutic exercises, Therapeutic activity, Neuromuscular re-education, Balance training, Gait training, Patient/Family education, Self Care, Joint mobilization, Stair training, Vestibular training, Visual/preceptual remediation/compensation, Orthotic/Fit training, DME instructions, Aquatic Therapy, Cognitive remediation, Electrical stimulation, Wheelchair mobility training, Manual therapy, and Re-evaluation  PLAN FOR NEXT SESSION: provide HEP, ambulatory balance, does she have sneakers?   JDebbora Dus PT JDebbora Dus PT, DPT, CBIS  04/25/2022, 3:41 PM

## 2022-05-02 ENCOUNTER — Ambulatory Visit: Payer: Medicaid Other | Attending: Primary Care | Admitting: Speech Pathology

## 2022-05-02 DIAGNOSIS — M25642 Stiffness of left hand, not elsewhere classified: Secondary | ICD-10-CM | POA: Insufficient documentation

## 2022-05-02 DIAGNOSIS — R41842 Visuospatial deficit: Secondary | ICD-10-CM | POA: Diagnosis present

## 2022-05-02 DIAGNOSIS — R41844 Frontal lobe and executive function deficit: Secondary | ICD-10-CM | POA: Insufficient documentation

## 2022-05-02 DIAGNOSIS — I639 Cerebral infarction, unspecified: Secondary | ICD-10-CM | POA: Insufficient documentation

## 2022-05-02 DIAGNOSIS — R278 Other lack of coordination: Secondary | ICD-10-CM | POA: Insufficient documentation

## 2022-05-02 DIAGNOSIS — R4701 Aphasia: Secondary | ICD-10-CM | POA: Diagnosis present

## 2022-05-02 DIAGNOSIS — R1312 Dysphagia, oropharyngeal phase: Secondary | ICD-10-CM | POA: Diagnosis present

## 2022-05-02 DIAGNOSIS — M6281 Muscle weakness (generalized): Secondary | ICD-10-CM | POA: Diagnosis present

## 2022-05-02 DIAGNOSIS — R471 Dysarthria and anarthria: Secondary | ICD-10-CM | POA: Insufficient documentation

## 2022-05-02 DIAGNOSIS — R2689 Other abnormalities of gait and mobility: Secondary | ICD-10-CM | POA: Diagnosis present

## 2022-05-02 DIAGNOSIS — M25641 Stiffness of right hand, not elsewhere classified: Secondary | ICD-10-CM | POA: Insufficient documentation

## 2022-05-02 NOTE — Therapy (Addendum)
OUTPATIENT SPEECH LANGUAGE PATHOLOGY TREATMENT   Patient Name: Roberta Calderon MRN: [id number] DOB:[birth year], 51 y.o., female Today's Date: 05/02/2022  PCP: Kerin Perna, NP REFERRING PROVIDER: Barb Merino, MD    End of Session - 05/02/22 1018     Visit Number 2    Number of Visits 10    Date for SLP Re-Evaluation 06/20/22    Authorization Type medicaid family planning - ST not covered    Authorization Time Period she is awaiting medicaid card    SLP Start Time 1015    SLP Stop Time  1054    SLP Time Calculation (min) 39 min    Activity Tolerance Patient limited by fatigue             Past Medical History:  Diagnosis Date   Hypertension    No past surgical history on file. Patient Active Problem List   Diagnosis Date Noted   Stroke (cerebrum) ([place]) 04/10/2022   Acute CVA (cerebrovascular accident) ([place]) 02/05/2022   Cerebrovascular accident (CVA) of right basal ganglia ([place]) 08/14/2021   Hypertensive urgency 08/14/2021   Cocaine abuse ([place]) 30/13/1438   Metabolic syndrome 88/75/7972   Right sided weakness 08/14/2021   Alcohol abuse 08/14/2021   Prolonged QT interval 08/14/2021   ICH (intracerebral hemorrhage) ([place]) 02/07/2021   Dyslipidemia    Right thalamic infarction ([place]) 04/08/2020   Prediabetes    Polysubstance abuse ([place])    Hemiparesis affecting left side as late effect of stroke ([place])    Ischemic stroke ([place]) 04/06/2020   Hypertension 04/06/2020   Tobacco use disorder 04/06/2020    ONSET DATE: 04/10/22   REFERRING DIAG: I63.9 (ICD-10-CM) - Cerebrovascular accident (CVA), unspecified mechanism ([place])   THERAPY DIAG:  Aphasia  Dysphagia, oropharyngeal phase  Dysarthria and anarthria  Rationale for Evaluation and Treatment Rehabilitation  SUBJECTIVE:   SUBJECTIVE STATEMENT: "It's ok" Pt accompanied by: self    PAIN:  Are you having pain? No   OBJECTIVE:   TODAY'S TREATMENT:  05-02-2022: Pt reports practicing flow  phonation at home. Initiated training on dysarthria strategies coupled with conversational therapy training techniques o target improved clarity of speech and increased intelligibility. Following systematic instruction paired with usual direct model and verbal feedback on performance, pt able to carryover skills to reading of 5-7 word sentences, initial attempt, in 22/30 opportunities. Given min-A, occasional model, able to correct error'd productions to improve clarity and slow rate of speech. Education on dysarthria compensations for speaker and listener to aid in improved communication efficacy. Handout provided. Pt requested to end session early d/t fatigue. Updated HEP.    PATIENT EDUCATION: Education details: See treatment and patient instructions Person educated: Patient Education method: Explanation, Demonstration, Verbal cues, and Handouts Education comprehension: verbalized understanding, returned demonstration, verbal cues required, and needs further education   GOALS: Goals reviewed with patient? Yes  LONG TERM GOALS: Target date: 06/20/22  Pt will complete HEP for dysarthria/voice with rare min A Baseline: noHEP Goal status: IN PROGRESS  2.  Pt will name 12 items for a personally relevant category  Baseline: named 8, (15-20 is WNL) Goal status: IN PROGRESS  3.  Pt will carryover compensations for  dysarthria to report reduction in need to repeat by 50% subjectively Baseline: not using compensations Goal status: IN PROGRESS  4.  Pt will demonstrate clear phonation over 8 minute conversation with occasional min A Baseline: phonation hoarse and strained Goal status: IN PROGRESS  5.  Pt will improve score on PROM by 3  points Baseline: 13/30 Goal status: IN PROGRESS  6. Pt will follow diet modifications and swallow precautions with mod I Baseline: not following precautions Goal status: IN PROGRESS  ASSESSMENT:  CLINICAL IMPRESSION: Patient is a 51 y.o. female who was  seen today for dysarthria, aphasia, and dysphagia. She had a 4th CVA 04/10/22. Since then she reports decreased word finding and intelligibility. [place] reports that she has to repeat herself frequently to her mom and healthcare providers. Today she presents with mild dysarthria, oropharyngeal dysphagia and mild high level word finding impairment. Speech is slurred with labial weakness and Her voice is strained and hoarse which also affects intelligibility. MBSS recommended Dys 2 solids. Johan has self advanced to regular solids but endorses onging coughing and choking with PO. PO trials today of thin liquid only revealed no overt s/s of aspiration. Will continue to assess swallow. She reports word finding difficulties as well. QAB WNL, however Natesha named 8 animals in 1 minute (15-20 is WNL). At this time, I recommend skilled ST to maximize intelligibility, safety of swallow and verbal expression for safety, independence and QOL.   OBJECTIVE IMPAIRMENTS include memory, expressive language, dysarthria, voice disorder, and dysphagia. These impairments are limiting patient from effectively communicating at home and in community and safety when swallowing. Factors affecting potential to achieve goals and functional outcome are previous level of function and transportation . Patient will benefit from skilled SLP services to address above impairments and improve overall function.  REHAB POTENTIAL: Fair lack of transportation, history of inconsistent attendance in prior courses of therpay  PLAN: SLP FREQUENCY: 1x/week  SLP DURATION: 8 weeks  PLANNED INTERVENTIONS: Aspiration precaution training, Pharyngeal strengthening exercises, Diet toleration management , Language facilitation, Environmental controls, Trials of upgraded texture/liquids, Cueing hierachy, Cognitive reorganization, Internal/external aids, Functional tasks, and Multimodal communication approach, repeat MBSS if indicated    Su Monks, [place] 05/02/2022, 10:54 AM

## 2022-05-07 ENCOUNTER — Ambulatory Visit (INDEPENDENT_AMBULATORY_CARE_PROVIDER_SITE_OTHER): Payer: Medicaid Other | Admitting: Primary Care

## 2022-05-07 ENCOUNTER — Other Ambulatory Visit: Payer: Self-pay

## 2022-05-07 ENCOUNTER — Encounter (INDEPENDENT_AMBULATORY_CARE_PROVIDER_SITE_OTHER): Payer: Self-pay | Admitting: Primary Care

## 2022-05-07 VITALS — BP 127/83 | HR 82 | Resp 16 | Ht 62.0 in | Wt 132.8 lb

## 2022-05-07 DIAGNOSIS — E119 Type 2 diabetes mellitus without complications: Secondary | ICD-10-CM

## 2022-05-07 DIAGNOSIS — I639 Cerebral infarction, unspecified: Secondary | ICD-10-CM | POA: Diagnosis not present

## 2022-05-07 DIAGNOSIS — Z09 Encounter for follow-up examination after completed treatment for conditions other than malignant neoplasm: Secondary | ICD-10-CM

## 2022-05-07 DIAGNOSIS — R531 Weakness: Secondary | ICD-10-CM

## 2022-05-07 DIAGNOSIS — I10 Essential (primary) hypertension: Secondary | ICD-10-CM | POA: Diagnosis not present

## 2022-05-07 DIAGNOSIS — G4700 Insomnia, unspecified: Secondary | ICD-10-CM | POA: Diagnosis not present

## 2022-05-07 LAB — GLUCOSE, POCT (MANUAL RESULT ENTRY): POC Glucose: 139 mg/dl — AB (ref 70–99)

## 2022-05-07 MED ORDER — METFORMIN HCL ER 500 MG PO TB24
500.0000 mg | ORAL_TABLET | Freq: Every day | ORAL | 1 refills | Status: DC
  Filled 2022-05-07: qty 90, 90d supply, fill #0

## 2022-05-07 NOTE — Patient Instructions (Signed)
Insomnia Insomnia is a sleep disorder that makes it difficult to fall asleep or stay asleep. Insomnia can cause fatigue, low energy, difficulty concentrating, mood swings, and poor performance at work or school. There are three different ways to classify insomnia: Difficulty falling asleep. Difficulty staying asleep. Waking up too early in the morning. Any type of insomnia can be long-term (chronic) or short-term (acute). Both are common. Short-term insomnia usually lasts for 3 months or less. Chronic insomnia occurs at least three times a week for longer than 3 months. What are the causes? Insomnia may be caused by another condition, situation, or substance, such as: Having certain mental health conditions, such as anxiety and depression. Using caffeine, alcohol, tobacco, or drugs. Having gastrointestinal conditions, such as gastroesophageal reflux disease (GERD). Having certain medical conditions. These include: Asthma. Alzheimer's disease. Stroke. Chronic pain. An overactive thyroid gland (hyperthyroidism). Other sleep disorders, such as restless legs syndrome and sleep apnea. Menopause. Sometimes, the cause of insomnia may not be known. What increases the risk? Risk factors for insomnia include: Gender. Females are affected more often than males. Age. Insomnia is more common as people get older. Stress and certain medical and mental health conditions. Lack of exercise. Having an irregular work schedule. This may include working night shifts and traveling between different time zones. What are the signs or symptoms? If you have insomnia, the main symptom is having trouble falling asleep or having trouble staying asleep. This may lead to other symptoms, such as: Feeling tired or having low energy. Feeling nervous about going to sleep. Not feeling rested in the morning. Having trouble concentrating. Feeling irritable, anxious, or depressed. How is this diagnosed? This condition  may be diagnosed based on: Your symptoms and medical history. Your health care provider may ask about: Your sleep habits. Any medical conditions you have. Your mental health. A physical exam. How is this treated? Treatment for insomnia depends on the cause. Treatment may focus on treating an underlying condition that is causing the insomnia. Treatment may also include: Medicines to help you sleep. Counseling or therapy. Lifestyle adjustments to help you sleep better. Follow these instructions at home: Eating and drinking  Limit or avoid alcohol, caffeinated beverages, and products that contain nicotine and tobacco, especially close to bedtime. These can disrupt your sleep. Do not eat a large meal or eat spicy foods right before bedtime. This can lead to digestive discomfort that can make it hard for you to sleep. Sleep habits  Keep a sleep diary to help you and your health care provider figure out what could be causing your insomnia. Write down: When you sleep. When you wake up during the night. How well you sleep and how rested you feel the next day. Any side effects of medicines you are taking. What you eat and drink. Make your bedroom a dark, comfortable place where it is easy to fall asleep. Put up shades or blackout curtains to block light from outside. Use a white noise machine to block noise. Keep the temperature cool. Limit screen use before bedtime. This includes: Not watching TV. Not using your smartphone, tablet, or computer. Stick to a routine that includes going to bed and waking up at the same times every day and night. This can help you fall asleep faster. Consider making a quiet activity, such as reading, part of your nighttime routine. Try to avoid taking naps during the day so that you sleep better at night. Get out of bed if you are still awake after   15 minutes of trying to sleep. Keep the lights down, but try reading or doing a quiet activity. When you feel  sleepy, go back to bed. General instructions Take over-the-counter and prescription medicines only as told by your health care provider. Exercise regularly as told by your health care provider. However, avoid exercising in the hours right before bedtime. Use relaxation techniques to manage stress. Ask your health care provider to suggest some techniques that may work well for you. These may include: Breathing exercises. Routines to release muscle tension. Visualizing peaceful scenes. Make sure that you drive carefully. Do not drive if you feel very sleepy. Keep all follow-up visits. This is important. Contact a health care provider if: You are tired throughout the day. You have trouble in your daily routine due to sleepiness. You continue to have sleep problems, or your sleep problems get worse. Get help right away if: You have thoughts about hurting yourself or someone else. Get help right away if you feel like you may hurt yourself or others, or have thoughts about taking your own life. Go to your nearest emergency room or: Call 911. Call the National Suicide Prevention Lifeline at 1-800-273-8255 or 988. This is open 24 hours a day. Text the Crisis Text Line at 741741. Summary Insomnia is a sleep disorder that makes it difficult to fall asleep or stay asleep. Insomnia can be long-term (chronic) or short-term (acute). Treatment for insomnia depends on the cause. Treatment may focus on treating an underlying condition that is causing the insomnia. Keep a sleep diary to help you and your health care provider figure out what could be causing your insomnia. This information is not intended to replace advice given to you by your health care provider. Make sure you discuss any questions you have with your health care provider. Document Revised: 07/24/2021 Document Reviewed: 07/24/2021 Elsevier Patient Education  2023 [employer]  

## 2022-05-07 NOTE — Progress Notes (Unsigned)
  [facility]   Subjective:   [name] is a 51 y.o. female presents for hospital follow up .  Patient's sister called EMS after patient developed expressive aphasia and right-sided facial droop.  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  Past Medical History:  Diagnosis Date   Hypertension      Allergies  Allergen Reactions   Nsaids Nausea And Vomiting and Rash   Aspirin Hives      Current Outpatient Medications on File Prior to Visit  Medication Sig Dispense Refill   atorvastatin (LIPITOR) 80 MG tablet Take 1 tablet (80 mg total) by mouth once daily. 90 tablet 1   clopidogrel (PLAVIX) 75 MG tablet Take 1 tablet (75 mg total) by mouth daily. 90 tablet 0   hydrochlorothiazide (HYDRODIURIL) 25 MG tablet Take 1 tablet (25 mg total) by mouth once daily. 90 tablet 3   losartan (COZAAR) 50 MG tablet Take 1 tablet (50 mg total) by mouth once daily. 90 tablet 1   metFORMIN (GLUCOPHAGE) 500 MG tablet Take 500 mg by mouth 2 (two) times daily with a meal.     traZODone (DESYREL) 50 MG tablet Take 0.5-1 tablets (25-50 mg total) by mouth at bedtime as needed for sleep. (Patient not taking: Reported on 05/07/2022) 60 tablet 1   No current facility-administered medications on file prior to visit.     Review of System: ROS  Objective:  BP 127/83   Pulse 82   Resp 16   Ht '5\' 2"'$  (1.575 m)   Wt 132 lb 12.8 oz (60.2 kg)   SpO2 98%   BMI 24.29 kg/m   Filed Weights   05/07/22 0942  Weight: 132 lb 12.8 oz (60.2 kg)    Physical Exam: General Appearance: Well nourished, in no apparent distress. Eyes: PERRLA, EOMs, conjunctiva no swelling or erythema Sinuses: No Frontal/maxillary tenderness ENT/Mouth: Ext aud canals clear, TMs without erythema, bulging. Asphasia,  Hearing normal.  Neck: Supple, thyroid normal.  Respiratory:  Respiratory effort normal, BS equal bilaterally without rales, rhonchi, wheezing or stridor.  Cardio: RRR with no MRGs. Brisk peripheral pulses without edema.  Abdomen: Soft, + BS.  Non tender, no guarding, rebound, hernias, masses. Lymphatics: Non tender without lymphadenopathy.  Musculoskeletal: Full ROM, 5/5 strength, normal gait.  Skin: Warm, dry without rashes, lesions, ecchymosis.  Neuro: Right-sided facial droop, and right-sided tongue deviation. Sensation intact, DTR normal. Strength 4 /5 in right upper and right lower extremity compared to 5/5 in the left side Psych: Awake and oriented X 3, normal affect, Insight and Judgment appropriate.    Assessment:   1. Type 2 diabetes mellitus without complication, without long-term current use of insulin (HCC)   6.3  No orders of the defined types were placed in this encounter.   This note has been created with Surveyor, quantity. Any transcriptional errors are unintentional.   Kerin Perna, NP 05/07/2022, 9:55 AM

## 2022-05-09 ENCOUNTER — Telehealth: Payer: Self-pay

## 2022-05-09 ENCOUNTER — Other Ambulatory Visit (INDEPENDENT_AMBULATORY_CARE_PROVIDER_SITE_OTHER): Payer: Self-pay | Admitting: Primary Care

## 2022-05-09 ENCOUNTER — Ambulatory Visit: Payer: Medicaid Other | Admitting: Speech Pathology

## 2022-05-09 ENCOUNTER — Encounter: Payer: Self-pay | Admitting: Speech Pathology

## 2022-05-09 ENCOUNTER — Ambulatory Visit: Payer: Medicaid Other

## 2022-05-09 DIAGNOSIS — R471 Dysarthria and anarthria: Secondary | ICD-10-CM

## 2022-05-09 DIAGNOSIS — M6281 Muscle weakness (generalized): Secondary | ICD-10-CM

## 2022-05-09 DIAGNOSIS — R2689 Other abnormalities of gait and mobility: Secondary | ICD-10-CM

## 2022-05-09 DIAGNOSIS — R4701 Aphasia: Secondary | ICD-10-CM

## 2022-05-09 DIAGNOSIS — R278 Other lack of coordination: Secondary | ICD-10-CM

## 2022-05-09 DIAGNOSIS — I69354 Hemiplegia and hemiparesis following cerebral infarction affecting left non-dominant side: Secondary | ICD-10-CM

## 2022-05-09 NOTE — Patient Instructions (Signed)
   For your left hand, practice flipping over cards and picking up cards off of the table without dragging them to the edge  Pick up coins without dragging them to the edge and put them in a cup    Get the persons attention before you speak  Use eye contact and face the person you are speaking to  Be in close proximity to the person you are speaking to  Turn down any noise in the environment such as the TV, walk away from loud appliances, air conditioners, fans, dish washers etc  Do HW taking really big and making each sound - this will exercise your mouth muscles and help you practice talking clearly  When you hear the "wet" "gurgly" voice - swallow  Swallow and breathe before you talk

## 2022-05-09 NOTE — Therapy (Signed)
OUTPATIENT PHYSICAL THERAPY TREATMENT NOTE   Patient Name: Roberta Calderon MRN: [id number] DOB:1971/07/02, 51 y.o., female Today's Date: 05/09/2022  PCP: Juluis Mire, NP  REFERRING PROVIDER: Barb Merino, MD  END OF SESSION:   PT End of Session - 05/09/22 0825     Visit Number 2    Number of Visits 9    Date for PT Re-Evaluation 06/20/22    Authorization Type Medicaid    PT Start Time 0930    PT Stop Time 1005   patient scheduled transportation home at 10am   PT Time Calculation (min) 35 min    Equipment Utilized During Treatment Gait belt    Activity Tolerance Patient tolerated treatment well    Behavior During Therapy [facility] for tasks assessed/performed;Impulsive             Past Medical History:  Diagnosis Date   Hypertension    History reviewed. No pertinent surgical history. Patient Active Problem List   Diagnosis Date Noted   Stroke (cerebrum) ([place]) 04/10/2022   Acute CVA (cerebrovascular accident) ([place]) 02/05/2022   Cerebrovascular accident (CVA) of right basal ganglia ([place]) 08/14/2021   Hypertensive urgency 08/14/2021   Cocaine abuse ([place]) 39/76/7341   Metabolic syndrome 93/79/0240   Right sided weakness 08/14/2021   Alcohol abuse 08/14/2021   Prolonged QT interval 08/14/2021   ICH (intracerebral hemorrhage) ([place]) 02/07/2021   Dyslipidemia    Right thalamic infarction ([place]) 04/08/2020   Prediabetes    Polysubstance abuse ([place])    Hemiparesis affecting left side as late effect of stroke ([place])    Ischemic stroke (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 States she's no longer considered diabetic and only pre-diabetic. Has multiple questions regarding sleeping pills (trazadone). PT informing patient that we cannot suggest rx or give any medical advice. PT advising patient to limit caffeine and increase physical activity as safe prior to bed to allow for more restful sleep. Patient stating that NP told her to ask [place] (PT clinic) about what medications to suggest for sleep.   PAIN:  Are you having pain? No   TODAY'S TREATMENT:   [facility] PT Assessment - 05/09/22 0001       Standardized Balance Assessment   Standardized Balance Assessment 10 meter walk test    Five times sit to stand comments  19.09    10 Meter Walk .41ms      Timed Up and Go Test   Normal TUG (seconds) 13.28      Functional Gait  Assessment   Gait assessed  Yes    Gait Level Surface Walks 20 ft in less than 7 sec but greater than 5.5 sec, uses assistive device, slower speed, mild gait deviations, or deviates 6-10 in outside of the 12 in walkway width.    Change in Gait Speed Able to change speed, demonstrates mild gait deviations, deviates 6-10 in outside of the 12 in walkway width, or no gait deviations, unable to achieve a major change in velocity, or uses a change  in velocity, or uses an assistive device.    Gait with Horizontal Head Turns Performs head turns with moderate changes in gait velocity, slows down, deviates 10-15 in outside 12 in walkway width but recovers, can continue to walk.    Gait with Vertical Head Turns Performs task with moderate change in gait velocity, slows down, deviates 10-15 in outside 12 in walkway width but recovers, can continue to walk.    Gait and Pivot Turn Pivot turns safely in greater than 3 sec and stops with no loss of balance, or pivot turns safely within 3 sec and  stops with mild imbalance, requires small steps to catch balance.    Step Over Obstacle Is able to step over one shoe box (4.5 in total height) but must slow down and adjust steps to clear box safely. May require verbal cueing.    Gait with Narrow Base of Support Ambulates 7-9 steps.    Gait with Eyes Closed Walks 20 ft, uses assistive device, slower speed, mild gait deviations, deviates 6-10 in outside 12 in walkway width. Ambulates 20 ft in less than 9 sec but greater than 7 sec.    Ambulating Backwards Walks 20 ft, slow speed, abnormal gait pattern, evidence for imbalance, deviates 10-15 in outside 12 in walkway width.    Steps Alternating feet, must use rail.    Total Score 16            Therex: -[place] level 2 x10 mins -HEP (see below)    PATIENT EDUCATION: Education details: OM results, wearing sneakers, OT referral, HEP Person educated: Patient Education method: Explanation Education comprehension: verbalized understanding and needs further education     HOME EXERCISE PROGRAM: Access Code: [email or web address] URL: [email or web address] Date: 05/09/2022 Prepared by: Estevan Ryder  Exercises - Supine Bridge  - 1 x daily - 7 x weekly - 3 sets - 10 reps - Clamshell  - 1 x daily - 7 x weekly - 3 sets - 10 reps - Sit to Stand with Counter Support  - 1 x daily - 7 x weekly - 3 sets - 10 reps - Standing Tandem Balance with Counter Support  - 1 x daily - 7 x weekly - 3 sets - 10 reps - Narrow Stance with Counter Support  - 1 x daily - 7 x weekly - 3 sets - 10 reps - Heel Raises with Counter Support  - 1 x daily - 7 x weekly - 3 sets - 10 reps  **standing exercises to be done with supervision and chair nearby    GOALS: Goals reviewed with patient? Yes   SHORT TERM GOALS: Target date: 05/23/2022   Pt will be independent with initial HEP for improved balance  Baseline: to be provided Goal status: INITIAL   2.  Pt will improve gait speed to >/= 1.27ms with LRAD to  demonstrate improved community ambulation   Baseline: 0.837m with no AD and CGA Goal status: INITIAL   3.  Pt will improve FGA to >/= 14/30 to demonstrate improved balance and reduced fall risk   Baseline: 9/30 Goal status: INITIAL   4.  Pt will improve 5x STS to </= 12 sec to demo improved functional LE strength and balance   Baseline: 16.56s with B UE and CGA Goal status: INITIAL   5.  Pt will improve TUG to </= 12 secs to demonstrated reduced fall risk   Baseline: 15.59s with no AD and CGA Goal status: INITIAL  LONG TERM GOALS: Target date: 06/20/2022   Pt will be independent with final HEP for improved balance and functional strength   Baseline: to be provided Goal status: INITIAL   2.  Pt will improve gait speed to >/= 1.41ms with LRAD to demonstrate improved community ambulation   Baseline: 0.846m with no AD and CGA Goal status: INITIAL   3.  Pt will improve FGA to >/= 19/30 to demonstrate improved balance and reduced fall risk   Baseline: 9/30 Goal status: INITIAL   4.  Pt will improve TUG to </= 9 secs with LRAD to demonstrated reduced fall risk   Baseline: 15.59s with no AD and CGA Goal status: INITIAL   5.  Pt will improve 5x STS to </= 9 sec with LRAD to demo improved functional LE strength and balance    Baseline: 16.56s with B UE Goal status: INITIAL   ASSESSMENT:   CLINICAL IMPRESSION: Patient seen for skilled PT session with emphasis on outcome measure assessment due to recent start to medicaid and HEP establishment. Patient demonstrates increased fall risk as noted by score of 16/30 on  Functional Gait Assessment.   <22/30 = predictive of falls, <20/30 = fall in 6 months, <18/30 = predictive of falls in PD MCID: 5 points stroke population, 4 points geriatric population (ANPTA Core Set of Outcome Measures for Adults with Neurologic Conditions, 2018). Patient completed the Timed Up and Go test (TUG) in 13.28 seconds.  Geriatrics: need for  further assessment of fall risk: ? 12 sec; Recurrent falls: > 15 sec; Vestibular Disorders fall risk: > 15 sec; Parkinson's Disease fall risk: > 16 sec ([email or web address]). 10 Meter Walk Test: Patient instructed to walk 10 meters (32.8 ft) as quickly and as safely as possible at their normal speed x2 and at a fast speed x2. Time measured from 2 meter mark to 8 meter mark to accommodate ramp-up and ramp-down.  Normal speed: .8127mCut off scores: <0.4 m/s = household Ambulator, 0.4-0.8 m/s = limited community Ambulator, >0.8 m/s = community Ambulator, >1.2 m/s = crossing a street, <1.0 = increased fall risk MCID 0.05 m/s (small), 0.13 m/s (moderate), 0.06 m/s (significant)  (ANPTA Core Set of Outcome Measures for Adults with Neurologic Conditions, 2018). Five times Sit to Stand Test (FTSS) Method: Use a straight back chair with a solid seat that is 17-18" high. Ask participant to sit on the chair with arms folded across their chest.   Instructions: "Stand up and sit down as quickly as possible 5 times, keeping your arms folded across your chest."   Measurement: Stop timing when the participant touches the chair in sitting the 5th time.  TIME: 19.09 sec with B UE  Cut off scores indicative of increased fall risk: >12 sec CVA, >16 sec PD, >13 sec vestibular (ANPTA Core Set of Outcome Measures for Adults with Neurologic Conditions, 2018). Continue POC.     OBJECTIVE IMPAIRMENTS Abnormal gait, cardiopulmonary status limiting activity, decreased activity tolerance, decreased balance, decreased cognition, decreased coordination, decreased endurance, decreased knowledge of condition, decreased knowledge of use of DME, decreased mobility, difficulty walking, decreased strength, decreased safety awareness, and impaired vision/preception.    ACTIVITY LIMITATIONS carrying, lifting, bending, standing, squatting, stairs, transfers, bed mobility, bathing, dressing, hygiene/grooming, locomotion level, and  caring for others   PARTICIPATION LIMITATIONS: meal prep, cleaning, laundry, medication management, personal finances, interpersonal relationship, driving, shopping, and community activity   PERSONAL FACTORS Behavior pattern, Education, Past/current experiences, Social background, Transportation, and 3+ comorbidities: multiple CVAs,  substance abuse, HTN  are also affecting patient's functional outcome.    REHAB POTENTIAL: Fair h/o multiple CVAs   CLINICAL DECISION MAKING: Evolving/moderate complexity   EVALUATION COMPLEXITY: Moderate   PLAN: PT FREQUENCY: 1x/week   PT DURATION: 8 weeks   PLANNED INTERVENTIONS: Therapeutic exercises, Therapeutic activity, Neuromuscular re-education, Balance training, Gait training, Patient/Family education, Self Care, Joint mobilization, Stair training, Vestibular training, Visual/preceptual remediation/compensation, Orthotic/Fit training, DME instructions, Aquatic Therapy, Cognitive remediation, Electrical stimulation, Wheelchair mobility training, Manual therapy, and Re-evaluation   PLAN FOR NEXT SESSION: ambulatory balance, does she have sneakers?   Managed medicaid CPT codes: [phone] - PT Re-evaluation, [phone]- Therapeutic Exercise, [phone]- Neuro Re-education, [phone] - Gait Training, [phone] - Manual Therapy, [phone] - Therapeutic Activities, [phone] - [place], [phone] - Electrical stimulation (unattended), [phone] - Electrical stimulation (Manual), [id number] - Orthotic Fit, and [id number] - Aquatic therapy  Debbora Dus, PT Debbora Dus, PT, DPT, CBIS  05/09/2022, 10:07 AM

## 2022-05-09 NOTE — Telephone Encounter (Signed)
Roberta Calderon,   Roberta Calderon is being treated by PT and ST.  The patient would benefit from an OT evaluation for UE weakness and coordination deficits.   If you agree, please place an order in [facility] workque in [facility] or fax the order to [phone].  Thank you, Debbora Dus, PT, DPT, [facility] [address] [place], [place]  [address] Phone:  [phone] Fax:  [phone]

## 2022-05-09 NOTE — Therapy (Signed)
OUTPATIENT SPEECH LANGUAGE PATHOLOGY TREATMENT   Patient Name: Roberta Calderon MRN: [id number] DOB:September 26, 1970, 51 y.o., female Today's Date: 05/09/2022  PCP: Roberta Perna, NP REFERRING PROVIDER: Barb Merino, MD    End of Session - 05/09/22 0845     Visit Number 3    Number of Visits 10    Date for SLP Re-Evaluation 06/20/22    Authorization Type medicaid family planning - ST not covered    Authorization Time Period she is awaiting medicaid card    SLP Start Time 0845    SLP Stop Time  0930    SLP Time Calculation (min) 45 min    Activity Tolerance Patient tolerated treatment well             Past Medical History:  Diagnosis Date   Hypertension    History reviewed. No pertinent surgical history. Patient Active Problem List   Diagnosis Date Noted   Stroke (cerebrum) ([place]) 04/10/2022   Acute CVA (cerebrovascular accident) ([place]) 02/05/2022   Cerebrovascular accident (CVA) of right basal ganglia ([place]) 08/14/2021   Hypertensive urgency 08/14/2021   Cocaine abuse ([place]) 77/93/9030   Metabolic syndrome 05/19/3006   Right sided weakness 08/14/2021   Alcohol abuse 08/14/2021   Prolonged QT interval 08/14/2021   ICH (intracerebral hemorrhage) ([place]) 02/07/2021   Dyslipidemia    Right thalamic infarction ([place]) 04/08/2020   Prediabetes    Polysubstance abuse ([place])    Hemiparesis affecting left side as late effect of stroke ([place])    Ischemic stroke ([place]) 04/06/2020   Hypertension 04/06/2020   Tobacco use disorder 04/06/2020    ONSET DATE: 04/10/22   REFERRING DIAG: I63.9 (ICD-10-CM) - Cerebrovascular accident (CVA), unspecified mechanism ([place])   THERAPY DIAG:  Dysarthria and anarthria  Aphasia  Rationale for Evaluation and Treatment Rehabilitation  SUBJECTIVE:   SUBJECTIVE STATEMENT: "II got SCAT" Pt accompanied by: self    PAIN:  Are you having pain? No   OBJECTIVE:   TODAY'S TREATMENT:   05-09-22: Roberta Calderon reports her voice has been more  clear - she has been practicing flow phonation. Voice clear upon entering. Reviewed compensations for dysarthria including slow rate, over articulation, breath support for volume. She carried over strategies on oral reading task with occasional min verbal cues and modeling - 3 episodes of glottal fry corrected with cues for breath support 17/20 sentences. In structured task repeating tri-syllable words then generating sentence with the word, Roberta Calderon required usual mod verbal cues and modeling to carryover strategies for dysarthria 25/30 sentences. She required cues to ID wet voice and to swallow prior to speaking. In structured speech task generating 3 sentence descriptions - with frequent verbal cues and modeling 13/15 sentences  - Convergent naming (simple) she named 7/8 with mod I, with phonemic cue she named 8/8  05-02-2022: Pt reports practicing flow phonation at home. Initiated training on dysarthria strategies coupled with conversational therapy training techniques o target improved clarity of speech and increased intelligibility. Following systematic instruction paired with usual direct model and verbal feedback on performance, pt able to carryover skills to reading of 5-7 word sentences, initial attempt, in 22/30 opportunities. Given min-A, occasional model, able to correct error'd productions to improve clarity and slow rate of speech. Education on dysarthria compensations for speaker and listener to aid in improved communication efficacy. Handout provided. Pt requested to end session early d/t fatigue. Updated HEP.    PATIENT EDUCATION: Education details: See treatment and patient instructions Person educated: Patient Education method: Explanation, Demonstration, Verbal cues,  and Handouts Education comprehension: verbalized understanding, returned demonstration, verbal cues required, and needs further education   GOALS: Goals reviewed with patient? Yes  LONG TERM GOALS: Target date:  06/20/22  Pt will complete HEP for dysarthria/voice with rare min A Baseline: noHEP Goal status: IN PROGRESS  2.  Pt will name 12 items for a personally relevant category  Baseline: named 8, (15-20 is WNL) Goal status: IN PROGRESS  3.  Pt will carryover compensations for  dysarthria to report reduction in need to repeat by 50% subjectively Baseline: not using compensations Goal status: IN PROGRESS  4.  Pt will demonstrate clear phonation over 8 minute conversation with occasional min A Baseline: phonation hoarse and strained Goal status: IN PROGRESS  5.  Pt will improve score on PROM by 3 points Baseline: 13/30 Goal status: IN PROGRESS  6. Pt will follow diet modifications and swallow precautions with mod I Baseline: not following precautions Goal status: IN PROGRESS  ASSESSMENT:  CLINICAL IMPRESSION: Patient is a 51 y.o. female who was seen today for dysarthria, aphasia, and dysphagia. She had a 4th CVA 04/10/22. Since then she reports decreased word finding and intelligibility. Roberta Calderon reports that she has to repeat herself frequently to her mom and healthcare providers. Today she presents with mild dysarthria, oropharyngeal dysphagia and mild high level word finding impairment. Speech is slurred with labial weakness and Her voice is strained and hoarse which also affects intelligibility. MBSS recommended Dys 2 solids. Roberta Calderon has self advanced to regular solids but endorses onging coughing and choking with PO. PO trials today of thin liquid only revealed no overt s/s of aspiration. Will continue to assess swallow. She reports word finding difficulties as well. QAB WNL, however Roberta Calderon named 8 animals in 1 minute (15-20 is WNL). At this time, I recommend skilled ST to maximize intelligibility, safety of swallow and verbal expression for safety, independence and QOL.   OBJECTIVE IMPAIRMENTS include memory, expressive language, dysarthria, voice disorder, and dysphagia. These impairments  are limiting patient from effectively communicating at home and in community and safety when swallowing. Factors affecting potential to achieve goals and functional outcome are previous level of function and transportation . Patient will benefit from skilled SLP services to address above impairments and improve overall function.  REHAB POTENTIAL: Fair lack of transportation, history of inconsistent attendance in prior courses of therpay  PLAN: SLP FREQUENCY: 1x/week  SLP DURATION: 8 weeks  PLANNED INTERVENTIONS: Aspiration precaution training, Pharyngeal strengthening exercises, Diet toleration management , Language facilitation, Environmental controls, Trials of upgraded texture/liquids, Cueing hierachy, Cognitive reorganization, Internal/external aids, Functional tasks, and Multimodal communication approach, repeat MBSS if indicated    Ichelle Harral, Annye Rusk, [place] 05/09/2022, 12:16 PM

## 2022-05-14 ENCOUNTER — Ambulatory Visit: Payer: Medicaid Other

## 2022-05-14 ENCOUNTER — Ambulatory Visit: Payer: Medicaid Other | Admitting: Speech Pathology

## 2022-05-14 ENCOUNTER — Other Ambulatory Visit: Payer: Self-pay

## 2022-05-14 ENCOUNTER — Encounter: Payer: Self-pay | Admitting: Speech Pathology

## 2022-05-14 DIAGNOSIS — M6281 Muscle weakness (generalized): Secondary | ICD-10-CM

## 2022-05-14 DIAGNOSIS — R4701 Aphasia: Secondary | ICD-10-CM

## 2022-05-14 DIAGNOSIS — R2689 Other abnormalities of gait and mobility: Secondary | ICD-10-CM

## 2022-05-14 DIAGNOSIS — R1312 Dysphagia, oropharyngeal phase: Secondary | ICD-10-CM

## 2022-05-14 DIAGNOSIS — R471 Dysarthria and anarthria: Secondary | ICD-10-CM

## 2022-05-14 DIAGNOSIS — R278 Other lack of coordination: Secondary | ICD-10-CM

## 2022-05-14 NOTE — Therapy (Signed)
OUTPATIENT PHYSICAL THERAPY TREATMENT NOTE   Patient Name: Roberta Calderon MRN: [id number] DOB:[birth year], 51 y.o., female Today's Date: 05/14/2022  PCP: Juluis Mire, NP  REFERRING PROVIDER: Barb Merino, MD  END OF SESSION:   PT End of Session - 05/14/22 1320     Visit Number 3    Number of Visits 9    Date for PT Re-Evaluation 06/20/22    Authorization Type Medicaid    PT Start Time 1318    PT Stop Time 1356    PT Time Calculation (min) 38 min    Equipment Utilized During Treatment Gait belt    Activity Tolerance Patient tolerated treatment well    Behavior During Therapy WFL for tasks assessed/performed;Impulsive             Past Medical History:  Diagnosis Date   Hypertension    History reviewed. No pertinent surgical history. Patient Active Problem List   Diagnosis Date Noted   Stroke (cerebrum) ([place]) 04/10/2022   Acute CVA (cerebrovascular accident) ([place]) 02/05/2022   Cerebrovascular accident (CVA) of right basal ganglia ([place]) 08/14/2021   Hypertensive urgency 08/14/2021   Cocaine abuse ([place]) 33/29/5188   Metabolic syndrome 41/66/0630   Right sided weakness 08/14/2021   Alcohol abuse 08/14/2021   Prolonged QT interval 08/14/2021   ICH (intracerebral hemorrhage) ([place]) 02/07/2021   Dyslipidemia    Right thalamic infarction ([place]) 04/08/2020   Prediabetes    Polysubstance abuse ([place])    Hemiparesis affecting left side as late effect of stroke ([place])    Ischemic stroke ([place]) 04/06/2020   Hypertension 04/06/2020   Tobacco use disorder 04/06/2020    REFERRING DIAG: Z60.109 (ICD-10-CM) - 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 Patient arriving in flip flops. States exercises are going well. Denies falls/near falls.   PAIN:  Are you having pain? No   TODAY'S TREATMENT:  NMR:  -scifit hills x10 mins level 2 B UE/LE  BP afterward: 133/96  BP after 2' rest: 138/97 -ambulatory U LE cone tap 4x6 with CGA/MinA (premature weight shift back to R noted when tapping with R foot; task limited in progression due to patient wearing sandals)   BP afterward: 145/85 -lateral stepping on foam balance beam x4 laps in // bars -tandem walking on foam balance beam x4 laps in // bars -rockerboard A/P EO/EC, rock fwd/backward  BP afterward: 147/91      PATIENT EDUCATION: Education details: continue HEP, wearing sneakers Person educated: Patient Education method: Explanation Education comprehension: verbalized understanding and needs further education     HOME EXERCISE PROGRAM: Access Code: [email or web address] URL: [email or web address] Date: 05/09/2022 Prepared by: Estevan Ryder  Exercises - Supine Bridge  - 1 x daily - 7 x weekly - 3 sets - 10 reps - Clamshell  - 1 x daily - 7 x weekly - 3 sets - 10 reps - Sit to Stand with Counter Support  - 1 x daily - 7 x weekly - 3 sets - 10 reps - Standing Tandem Balance with Counter Support  - 1 x daily - 7 x weekly - 3 sets - 10 reps - Narrow Stance with  Counter Support  - 1 x daily - 7 x weekly - 3 sets - 10 reps - Heel Raises with Counter Support  - 1 x daily - 7 x weekly - 3 sets - 10 reps  **standing exercises to be done with supervision and chair nearby    GOALS: Goals reviewed with patient? Yes   SHORT TERM GOALS: Target date: 05/23/2022   Pt will be independent with initial HEP for improved balance  Baseline: to be provided Goal status: INITIAL   2.  Pt will improve gait speed to >/= 1.66ms with LRAD to demonstrate  improved community ambulation   Baseline: 0.824m with no AD and CGA Goal status: INITIAL   3.  Pt will improve FGA to >/= 14/30 to demonstrate improved balance and reduced fall risk   Baseline: 9/30 Goal status: INITIAL   4.  Pt will improve 5x STS to </= 12 sec to demo improved functional LE strength and balance   Baseline: 16.56s with B UE and CGA Goal status: INITIAL   5.  Pt will improve TUG to </= 12 secs to demonstrated reduced fall risk   Baseline: 15.59s with no AD and CGA Goal status: INITIAL       LONG TERM GOALS: Target date: 06/20/2022   Pt will be independent with final HEP for improved balance and functional strength   Baseline: to be provided Goal status: INITIAL   2.  Pt will improve gait speed to >/= 1.66m103mwith LRAD to demonstrate improved community ambulation   Baseline: 0.59m/65mith no AD and CGA Goal status: INITIAL   3.  Pt will improve FGA to >/= 19/30 to demonstrate improved balance and reduced fall risk   Baseline: 9/30 Goal status: INITIAL   4.  Pt will improve TUG to </= 9 secs with LRAD to demonstrated reduced fall risk   Baseline: 15.59s with no AD and CGA Goal status: INITIAL   5.  Pt will improve 5x STS to </= 9 sec with LRAD to demo improved functional LE strength and balance    Baseline: 16.56s with B UE Goal status: INITIAL   ASSESSMENT:   CLINICAL IMPRESSION: Patient seen for skilled PT session with emphasis on ambulatory balance retraining. Tolerating tasks well, but limited by wearing sandals instead of sneakers/shoes with backs and increased stability. Patient would benefit from further work on her endurance. Continue POC.     OBJECTIVE IMPAIRMENTS Abnormal gait, cardiopulmonary status limiting activity, decreased activity tolerance, decreased balance, decreased cognition, decreased coordination, decreased endurance, decreased knowledge of condition, decreased knowledge of use of DME, decreased mobility, difficulty walking,  decreased strength, decreased safety awareness, and impaired vision/preception.    ACTIVITY LIMITATIONS carrying, lifting, bending, standing, squatting, stairs, transfers, bed mobility, bathing, dressing, hygiene/grooming, locomotion level, and caring for others   PARTICIPATION LIMITATIONS: meal prep, cleaning, laundry, medication management, personal finances, interpersonal relationship, driving, shopping, and community activity   PERSONAL FACTORS Behavior pattern, Education, Past/current experiences, Social background, Transportation, and 3+ comorbidities: multiple CVAs, substance abuse, HTN  are also affecting patient's functional outcome.    REHAB POTENTIAL: Fair h/o multiple CVAs   CLINICAL DECISION MAKING: Evolving/moderate complexity   EVALUATION COMPLEXITY: Moderate   PLAN: PT FREQUENCY: 1x/week   PT DURATION: 8 weeks   PLANNED INTERVENTIONS: Therapeutic exercises, Therapeutic activity, Neuromuscular re-education, Balance training, Gait training, Patient/Family education, Self Care, Joint mobilization, Stair training, Vestibular training, Visual/preceptual remediation/compensation, Orthotic/Fit training, DME instructions, Aquatic Therapy, Cognitive remediation, Electrical stimulation, Wheelchair mobility training, Manual therapy, and  Re-evaluation   PLAN FOR NEXT SESSION: ambulatory balance, does she have sneakers?   Managed medicaid CPT codes: [phone] - PT Re-evaluation, [phone]- Therapeutic Exercise, [phone]- Neuro Re-education, [phone] - Gait Training, [phone] - Manual Therapy, [phone] - Therapeutic Activities, [phone] - [place], [phone] - Electrical stimulation (unattended), [phone] - Electrical stimulation (Manual), [id number] - Orthotic Fit, and [id number] - Aquatic therapy  Debbora Dus, PT Debbora Dus, PT, DPT, CBIS  05/14/2022, 1:58 PM

## 2022-05-14 NOTE — Therapy (Signed)
OUTPATIENT SPEECH LANGUAGE PATHOLOGY TREATMENT   Patient Name: DELBRA ZELLARS MRN: [id number] DOB:Dec 01, 1970, 51 y.o., female Today's Date: 05/14/2022  PCP: Kerin Perna, NP REFERRING PROVIDER: Barb Merino, MD    End of Session - 05/14/22 1357     Visit Number 4    Number of Visits 10    Date for SLP Re-Evaluation 06/20/22    Authorization Type [employer] medicaid approved 8 ST visits    Authorization - Visit Number 3    Authorization - Number of Visits 8    SLP Start Time 1400    SLP Stop Time  1445    SLP Time Calculation (min) 45 min    Activity Tolerance Patient tolerated treatment well             Past Medical History:  Diagnosis Date   Hypertension    History reviewed. No pertinent surgical history. Patient Active Problem List   Diagnosis Date Noted   Stroke (cerebrum) ([place]) 04/10/2022   Acute CVA (cerebrovascular accident) ([place]) 02/05/2022   Cerebrovascular accident (CVA) of right basal ganglia ([place]) 08/14/2021   Hypertensive urgency 08/14/2021   Cocaine abuse ([place]) 25/36/6440   Metabolic syndrome 34/74/2595   Right sided weakness 08/14/2021   Alcohol abuse 08/14/2021   Prolonged QT interval 08/14/2021   ICH (intracerebral hemorrhage) ([place]) 02/07/2021   Dyslipidemia    Right thalamic infarction ([place]) 04/08/2020   Prediabetes    Polysubstance abuse ([place])    Hemiparesis affecting left side as late effect of stroke ([place])    Ischemic stroke ([place]) 04/06/2020   Hypertension 04/06/2020   Tobacco use disorder 04/06/2020    ONSET DATE: 04/10/22   REFERRING DIAG: I63.9 (ICD-10-CM) - Cerebrovascular accident (CVA), unspecified mechanism ([place])   THERAPY DIAG:  Dysarthria and anarthria  Aphasia  Dysphagia, oropharyngeal phase  Rationale for Evaluation and Treatment Rehabilitation  SUBJECTIVE:   SUBJECTIVE STATEMENT: "II have to go slow" re: speech Pt accompanied by: self    PAIN:  Are you having pain? No   OBJECTIVE:   TODAY'S  TREATMENT:   05-14-22: Voice mildly hoarse - targeted carryover of compensations of slow rate, over articulation and syllabification repeating 4 syllable words then generating sentence with the word - Kayde required usual mod verbal cues and modeling to carryover strategies - 12/15 sentences are intelligible. Targeted naming in personally relevant categories (volleyball/track/basketball and [employer] teams) She named 4/12 with extended time and mod I, with semantic, phonemic, 1st letter cues she named 8/12. Trained in verbal compensations for aphasia - in structured task, Manjot generated salient descriptions of common objects 10/10x with rare min questioning cues. Instructed her to use descriptions in conversation as needed - she demonstrated verbal compensation in conversation 2x with questioning cues.   To target word finding  Pharmacist, hospital ([facility] was utilized. The pt generated 3 subjects and objects for 3 verbs, measure, deliver and shake for a total of 9 subject objects. Pt required occasional min semantic cues cues. Pt generated 3 complex sentences by answering "wh" questions. Pt required rare min cues to generate complex sentences. Provided tri-syllable words to add to HEP for dysarthria, and fill in missing letter in category naming for home work   05-09-22: Precilla reports her voice has been more clear - she has been practicing flow phonation. Voice clear upon entering. Reviewed compensations for dysarthria including slow rate, over articulation, breath support for volume. She carried over strategies on oral reading task with occasional min verbal cues and modeling -  3 episodes of glottal fry corrected with cues for breath support 17/20 sentences. In structured task repeating tri-syllable words then generating sentence with the word, Ladeana required usual mod verbal cues and modeling to carryover strategies for dysarthria 25/30 sentences. She required cues to ID wet voice and to  swallow prior to speaking. In structured speech task generating 3 sentence descriptions - with frequent verbal cues and modeling 13/15 sentences  - Convergent naming (simple) she named 7/8 with mod I, with phonemic cue she named 8/8  05-02-2022: Pt reports practicing flow phonation at home. Initiated training on dysarthria strategies coupled with conversational therapy training techniques o target improved clarity of speech and increased intelligibility. Following systematic instruction paired with usual direct model and verbal feedback on performance, pt able to carryover skills to reading of 5-7 word sentences, initial attempt, in 22/30 opportunities. Given min-A, occasional model, able to correct error'd productions to improve clarity and slow rate of speech. Education on dysarthria compensations for speaker and listener to aid in improved communication efficacy. Handout provided. Pt requested to end session early d/t fatigue. Updated HEP.    PATIENT EDUCATION: Education details: See treatment and patient instructions Person educated: Patient Education method: Explanation, Demonstration, Verbal cues, and Handouts Education comprehension: verbalized understanding, returned demonstration, verbal cues required, and needs further education   GOALS: Goals reviewed with patient? Yes  LONG TERM GOALS: Target date: 06/20/22  Pt will complete HEP for dysarthria/voice with rare min A Baseline: noHEP Goal status: IN PROGRESS  2.  Pt will name 12 items for a personally relevant category  Baseline: named 8, (15-20 is WNL) Goal status: IN PROGRESS  3.  Pt will carryover compensations for  dysarthria to report reduction in need to repeat by 50% subjectively Baseline: not using compensations Goal status: IN PROGRESS  4.  Pt will demonstrate clear phonation over 8 minute conversation with occasional min A Baseline: phonation hoarse and strained Goal status: IN PROGRESS  5.  Pt will improve score on  PROM by 3 points Baseline: 13/30 Goal status: IN PROGRESS  6. Pt will follow diet modifications and swallow precautions with mod I Baseline: not following precautions Goal status: IN PROGRESS  ASSESSMENT:  CLINICAL IMPRESSION: Patient is a 51 y.o. female who was seen today for dysarthria, aphasia, and dysphagia. She had a 4th CVA 04/10/22. Since then she reports decreased word finding and intelligibility. Anapaula reports that she has to repeat herself frequently to her mom and healthcare providers. Today she presents with mild dysarthria, oropharyngeal dysphagia and mild high level word finding impairment. Speech is slurred with labial weakness and Her voice is strained and hoarse which also affects intelligibility. Ongoing training in HEP and compensatory strategies for dysarthria and word finding. Joshlynn is generated accurate compensations for word finding in structured tasks, ongoing training to carryover to spontaneous conversation. She is using compensations to improve intelligibility in structured tasks, again requiring min to mod A to carryover to conversation level.  I recommend skilled ST to maximize intelligibility, safety of swallow and verbal expression for safety, independence and QOL.   OBJECTIVE IMPAIRMENTS include memory, expressive language, dysarthria, voice disorder, and dysphagia. These impairments are limiting patient from effectively communicating at home and in community and safety when swallowing. Factors affecting potential to achieve goals and functional outcome are previous level of function and transportation . Patient will benefit from skilled SLP services to address above impairments and improve overall function.  REHAB POTENTIAL: Fair lack of transportation, history of inconsistent attendance in  prior courses of therpay  PLAN: SLP FREQUENCY: 1x/week  SLP DURATION: 8 weeks  PLANNED INTERVENTIONS: Aspiration precaution training, Pharyngeal strengthening exercises,  Diet toleration management , Language facilitation, Environmental controls, Trials of upgraded texture/liquids, Cueing hierachy, Cognitive reorganization, Internal/external aids, Functional tasks, and Multimodal communication approach, repeat MBSS if indicated    Brentt Fread, Annye Rusk, [place] 05/14/2022, 3:00 PM

## 2022-05-21 ENCOUNTER — Ambulatory Visit: Payer: Medicaid Other | Admitting: Speech Pathology

## 2022-05-21 ENCOUNTER — Ambulatory Visit: Payer: Medicaid Other | Admitting: Physical Therapy

## 2022-05-21 VITALS — BP 129/79 | HR 64

## 2022-05-21 DIAGNOSIS — R2689 Other abnormalities of gait and mobility: Secondary | ICD-10-CM

## 2022-05-21 DIAGNOSIS — R4701 Aphasia: Secondary | ICD-10-CM | POA: Diagnosis not present

## 2022-05-21 DIAGNOSIS — R278 Other lack of coordination: Secondary | ICD-10-CM

## 2022-05-21 DIAGNOSIS — M6281 Muscle weakness (generalized): Secondary | ICD-10-CM

## 2022-05-21 NOTE — Therapy (Signed)
OUTPATIENT PHYSICAL THERAPY TREATMENT NOTE   Patient Name: Roberta Calderon MRN: [id number] DOB:[birth year], 51 y.o., female Today's Date: 05/21/2022  PCP: Juluis Mire, NP  REFERRING PROVIDER: Barb Merino, MD  END OF SESSION:   PT End of Session - 05/21/22 1102     Visit Number 4    Number of Visits 9    Date for PT Re-Evaluation 06/20/22    Authorization Type Medicaid    PT Start Time 1101    PT Stop Time 9741   pt left appointment early   PT Time Calculation (min) 24 min    Equipment Utilized During Treatment Gait belt    Activity Tolerance Patient tolerated treatment well    Behavior During Therapy [facility] for tasks assessed/performed;Impulsive              Past Medical History:  Diagnosis Date   Hypertension    No past surgical history on file. Patient Active Problem List   Diagnosis Date Noted   Stroke (cerebrum) ([place]) 04/10/2022   Acute CVA (cerebrovascular accident) ([place]) 02/05/2022   Cerebrovascular accident (CVA) of right basal ganglia ([place]) 08/14/2021   Hypertensive urgency 08/14/2021   Cocaine abuse ([place]) 63/84/5364   Metabolic syndrome 68/10/2120   Right sided weakness 08/14/2021   Alcohol abuse 08/14/2021   Prolonged QT interval 08/14/2021   ICH (intracerebral hemorrhage) ([place]) 02/07/2021   Dyslipidemia    Right thalamic infarction ([place]) 04/08/2020   Prediabetes    Polysubstance abuse ([place])    Hemiparesis affecting left side as late effect of stroke ([place])    Ischemic stroke ([place]) 04/06/2020   Hypertension 04/06/2020   Tobacco use disorder 04/06/2020    REFERRING DIAG: Q82.500 (ICD-10-CM) - 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 No pain, no falls, no near falls. Pt has been doing her HEP with no questions. Pt does wear sneakers to her therapy appointment this date. Pt reports she has to leave at 11:30 because her ride will be here (PT session scheduled until 11:45).  PAIN:  Are you having pain? No   TODAY'S TREATMENT:   Vitals:   05/21/22 1105  BP: 129/79  Pulse: 64     THER ACT:   [facility] PT Assessment - 05/21/22 1107       Ambulation/Gait   Gait velocity 32.8 ft over 10.6 sec = 3.09 ft/sec      Standardized Balance Assessment   Standardized Balance Assessment Timed Up and Go Test;Five Times Sit to Stand    Five times sit to stand comments  18.06 sec      Timed Up and Go Test   TUG Normal TUG    Normal TUG (seconds) 10.56      Functional Gait  Assessment   Gait assessed  Yes    Gait Level Surface Walks 20 ft in less than 7 sec but greater than 5.5 sec, uses assistive device, slower speed, mild gait deviations, or deviates 6-10 in outside of the 12 in walkway width.    Change in Gait Speed Able to change speed, demonstrates mild gait deviations, deviates 6-10 in outside of the  12 in walkway width, or no gait deviations, unable to achieve a major change in velocity, or uses a change in velocity, or uses an assistive device.    Gait with Horizontal Head Turns Performs head turns with moderate changes in gait velocity, slows down, deviates 10-15 in outside 12 in walkway width but recovers, can continue to walk.    Gait with Vertical Head Turns Performs task with moderate change in gait velocity, slows down, deviates 10-15 in outside 12 in walkway width but recovers, can continue to walk.    Gait and Pivot Turn Pivot turns safely in greater than 3 sec and stops with no loss of balance, or pivot turns safely within 3 sec and stops with mild  imbalance, requires small steps to catch balance.    Step Over Obstacle Is able to step over one shoe box (4.5 in total height) but must slow down and adjust steps to clear box safely. May require verbal cueing.    Gait with Narrow Base of Support Ambulates less than 4 steps heel to toe or cannot perform without assistance.    Gait with Eyes Closed Walks 20 ft, uses assistive device, slower speed, mild gait deviations, deviates 6-10 in outside 12 in walkway width. Ambulates 20 ft in less than 9 sec but greater than 7 sec.    Ambulating Backwards Walks 20 ft, slow speed, abnormal gait pattern, evidence for imbalance, deviates 10-15 in outside 12 in walkway width.    Steps Alternating feet, must use rail.    Total Score 14    FGA comment: 14/30            Pt with questions during session about how to obtain a BP cuff and a BSC, this therapist offered to find out more information about insurance coverage for equipment and how pt can obtain equipment. Pt becomes frustrated with this therapist and elects to end session early. Pt also on the phone during session trying to call her ride through [place], despite being informed that her ride is scheduled for pickup from 11:45-12:15 pt still elects to end session early this date.   PATIENT EDUCATION: Education details: continue HEP Person educated: Patient Education method: Explanation Education comprehension: verbalized understanding and needs further education     HOME EXERCISE PROGRAM: Access Code: [email or web address] URL: [email or web address] Date: 05/09/2022 Prepared by: Estevan Ryder  Exercises - Supine Bridge  - 1 x daily - 7 x weekly - 3 sets - 10 reps - Clamshell  - 1 x daily - 7 x weekly - 3 sets - 10 reps - Sit to Stand with Counter Support  - 1 x daily - 7 x weekly - 3 sets - 10 reps - Standing Tandem Balance with Counter Support  - 1 x daily - 7 x weekly - 3 sets - 10 reps - Narrow Stance with Counter Support  - 1 x daily  - 7 x weekly - 3 sets - 10 reps - Heel Raises with Counter Support  - 1 x daily - 7 x weekly - 3 sets - 10 reps  **standing exercises to be done with supervision and chair nearby    GOALS: Goals reviewed with patient? Yes   SHORT TERM GOALS: Target date: 05/23/2022   Pt will be independent with initial HEP for improved balance  Baseline: to be provided Goal status: MET   2.  Pt will improve gait speed to >/= 1.52ms with LRAD to demonstrate improved community ambulation   Baseline: 0.872m  with no AD and CGA, 0.94 m/s with no AD and S* 99/25) Goal status: IN PROGRESS   3.  Pt will improve FGA to >/= 14/30 to demonstrate improved balance and reduced fall risk   Baseline: 9/30, 14/30 (9/25) Goal status: MET   4.  Pt will improve 5x STS to </= 12 sec to demo improved functional LE strength and balance   Baseline: 16.56s with B UE and CGA, 18.06 s with BUE and S* (9/25) Goal status: NOT MET   5.  Pt will improve TUG to </= 12 secs to demonstrated reduced fall risk   Baseline: 15.59s with no AD and CGA, 10.56 sec (9/25) Goal status: MET       LONG TERM GOALS: Target date: 06/20/2022   Pt will be independent with final HEP for improved balance and functional strength   Baseline: to be provided Goal status: INITIAL   2.  Pt will improve gait speed to >/= 1.6ms with LRAD to demonstrate improved community ambulation   Baseline: 0.814m with no AD and CGA, 0.94 m/s Goal status: INITIAL   3.  Pt will improve FGA to >/= 19/30 to demonstrate improved balance and reduced fall risk   Baseline: 9/30. 14/30 Goal status: INITIAL   4.  Pt will improve TUG to </= 9 secs with LRAD to demonstrated reduced fall risk   Baseline: 15.59s with no AD and CGA, 10.56 s Goal status: INITIAL   5.  Pt will improve 5x STS to </= 9 sec with LRAD to demo improved functional LE strength and balance    Baseline: 16.56s with B UE, 18.06 s Goal status: INITIAL   ASSESSMENT:   CLINICAL  IMPRESSION: Emphasis of skilled PT session on reassessing STG. Pt has met 3/5 STG due to improved gait speed from 0.8 m/s to 0.94 m/s, improvement in FGA score from 9/30 to 14/30, and improvement in TUG score from 15.59 sec to 10.56 sec, demonstrating improved balance and decrease fall risk. Pt is also currently independent with her initial HEP. Pt exhibited an increase in her 5xSTS score from 16.56 s to 18.06 s, demonstrating a decrease in functional strength. Pt has exhibited non-compliance with therapy recommendations in the past as well as decreased insight into her deficits and understanding of purpose of PT to address her balance and safety impairments. If pt is willing to participate in therapy sessions, adhere to therapy recommendations, and continue with her HEP she could benefit from skilled therapy services to address ongoing gait and balance impairments in order to decrease her fall risk. Continue POC.    OBJECTIVE IMPAIRMENTS Abnormal gait, cardiopulmonary status limiting activity, decreased activity tolerance, decreased balance, decreased cognition, decreased coordination, decreased endurance, decreased knowledge of condition, decreased knowledge of use of DME, decreased mobility, difficulty walking, decreased strength, decreased safety awareness, and impaired vision/preception.    ACTIVITY LIMITATIONS carrying, lifting, bending, standing, squatting, stairs, transfers, bed mobility, bathing, dressing, hygiene/grooming, locomotion level, and caring for others   PARTICIPATION LIMITATIONS: meal prep, cleaning, laundry, medication management, personal finances, interpersonal relationship, driving, shopping, and community activity   PERSONAL FACTORS Behavior pattern, Education, Past/current experiences, Social background, Transportation, and 3+ comorbidities: multiple CVAs, substance abuse, HTN  are also affecting patient's functional outcome.    REHAB POTENTIAL: Fair h/o multiple CVAs    CLINICAL DECISION MAKING: Evolving/moderate complexity   EVALUATION COMPLEXITY: Moderate   PLAN: PT FREQUENCY: 1x/week   PT DURATION: 8 weeks   PLANNED INTERVENTIONS: Therapeutic exercises, Therapeutic activity, Neuromuscular re-education, Balance  training, Gait training, Patient/Family education, Self Care, Joint mobilization, Stair training, Vestibular training, Visual/preceptual remediation/compensation, Orthotic/Fit training, DME instructions, Aquatic Therapy, Cognitive remediation, Electrical stimulation, Wheelchair mobility training, Manual therapy, and Re-evaluation   PLAN FOR NEXT SESSION: ambulatory balance, discuss POC   Managed medicaid CPT codes: [phone] - PT Re-evaluation, 97110- Therapeutic Exercise, [phone]- Neuro Re-education, [phone] - Gait Training, [phone] - Manual Therapy, 97530 - Therapeutic Activities, [place], [address] - Electrical stimulation (unattended), [id number] - Electrical stimulation (Manual), [place], and [id number] - Aquatic therapy  Excell Seltzer, PT, DPT, CSRS   05/21/2022, 11:30 AM

## 2022-05-22 NOTE — Therapy (Addendum)
OUTPATIENT OCCUPATIONAL THERAPY NEURO EVALUATION  Patient Name: Roberta Calderon MRN: [id number] DOB:06-29-1971, 51 y.o., female Today's Date: 05/23/2022  PCP: Juluis Mire, NP  REFERRING PROVIDER: Barb Merino, MD   OT End of Session - 05/23/22 1248     Visit Number 1    Number of Visits 5    Date for OT Re-Evaluation 06/20/22    Authorization Type [place] Medicaid - [employer] - needs auth    Authorization - Visit Number 1    Authorization - Number of Visits --   Awaiting auth   Progress Note Due on Visit 5    OT Start Time 1055    OT Stop Time 1137    OT Time Calculation (min) 42 min    Activity Tolerance Patient tolerated treatment well;Other (comment)   pt easily agitated at times with questions during assessment   Behavior During Therapy [facility] for tasks assessed/performed;Restless;Agitated;Impulsive             Past Medical History:  Diagnosis Date   Hypertension    History reviewed. No pertinent surgical history. Patient Active Problem List   Diagnosis Date Noted   Stroke (cerebrum) ([place]) 04/10/2022   Acute CVA (cerebrovascular accident) ([place]) 02/05/2022   Cerebrovascular accident (CVA) of right basal ganglia ([place]) 08/14/2021   Hypertensive urgency 08/14/2021   Cocaine abuse ([place]) 84/16/6063   Metabolic syndrome 01/60/1093   Right sided weakness 08/14/2021   Alcohol abuse 08/14/2021   Prolonged QT interval 08/14/2021   ICH (intracerebral hemorrhage) ([place]) 02/07/2021   Dyslipidemia    Right thalamic infarction ([place]) 04/08/2020   Prediabetes    Polysubstance abuse ([place])    Hemiparesis affecting left side as late effect of stroke ([place])    Ischemic stroke ([place]) 04/06/2020   Hypertension 04/06/2020   Tobacco use disorder 04/06/2020    ONSET DATE: 04/10/22  REFERRING DIAG: A35.573 (ICD-10-CM) - 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 Pt accompanied by: self  PERTINENT HISTORY: multiple lacunar strokes with residual mild right-sided weakness, uncontrolled hypertension, history of cocaine abuse, current smoker. H/o multiple CVA's    PRECAUTIONS: Fall  WEIGHT BEARING RESTRICTIONS No  PAIN:  Are you having pain? No  FALLS: Has patient fallen in last 6 months? Yes. Number of falls 3 pt reports that she uses a RW for longer distances but does not have it with her today.  LIVING ENVIRONMENT: Lives with: Mom Lives in: House/apartment Stairs: No Has following equipment at home: Gilford Rile - 2 wheeled  PLOF: Needs assistance with ADLs and Needs assistance with homemaking at baseline per chart review and pt report. Patient reporting that she required help with all ADLs- at todays appt, patient then reports that she was completely independent with all ADLs prior to this last stroke. Difficult to assess secondary to cognitive impairments at baseline.  PATIENT GOALS Pt reports that she would like to get better use of her right UE.  OBJECTIVE:    DIAGNOSTIC FINDINGS: MRI 04/10/22: Small acute infarct  posterior limb internal capsule on the left  HAND DOMINANCE: Right  ADLs: Overall ADLs: Pt has conflicting reports during this assessment, first stating that she required assistance with all ADL's prior to this most recent CVA, then stating that she was independent prior to this most recent stroke. Transfers/ambulation related to ADLs: Pt presents in clinic without AD, ambulating. Pt  reports that her mother currently assists her with cooking, cleaning and ADL's, PRN. Pt states that she got herself dressed today, does grooming etc. Pt states that her mother assists with meal prep and cutting food prior to this CVA. Equipment: none per pt report.   IADLs: Pt reports that her mother assists with IADL's, pt also states that she does some on her own. Per chart review, pt mother was assisting with IADL's at baseline, prior to 01/2022.  Community mobility: Pt presents w/o AD, but does state that she uses a RW for community distances at times. Medication management: Pt states that her mother assists her PRN at baseline (prior to this CVA) Financial management: Pt mother does this per pt report at baseline (prior to this CVA) Handwriting: Pt signature is 100% legible using regular sized pen. Attempted using larger grip but pt did not prefer this. After signing her name, pt states "I can't do it".  MOBILITY STATUS: Independent, Hx of falls, and pt reports 3 falls in the last 6 months. She is currently being seen by PT.  POSTURE COMMENTS:  No Significant postural limitations  ACTIVITY TOLERANCE: Activity tolerance: Pt becomes easily frustrated at times with questions during this assessment. Appears to be at or near baseline.  FUNCTIONAL OUTCOME MEASURES: FOTO  UPPER EXTREMITY ROM     Active ROM Right eval Left eval  Shoulder flexion [facility]  Shoulder abduction    Shoulder adduction    Shoulder extension [facility]  Shoulder internal rotation    Shoulder external rotation    Elbow flexion [facility] WFL  Elbow extension [facility]  Wrist flexion [facility] WFL  Wrist extension [facility] WFL  Wrist ulnar deviation WFL WFL  Wrist radial deviation WFL WFL  Wrist pronation WFL WFL  Wrist supination WFL WFL  (Blank rows = not tested)   UPPER EXTREMITY MMT:     MMT Right Eval Grossly 4+/5 throughout Left Eval Grossly 5/5 throughout     HAND FUNCTION: Grip strength: Right: 46.9 lbs;  Left: 40.1 lbs, Lateral pinch: Right: 11 lbs, Left: 10 lbs, and 3 point pinch: Right: 11 lbs, Left: 10.5 lbs  COORDINATION: 9 Hole Peg test: Right: 27.53 sec; Left: 35.87 sec  SENSATION: Light touch: Pt appears intact to light touch bilateral hands, but she reports paresthesias in her left, digits 1-5 from a previous CVA.  EDEMA: None noted or observed  MUSCLE TONE: N/A  COGNITION: Overall cognitive status: History of cognitive impairments - at baseline. Pt with h/o decreased attention, h/o frontal lobe and executive function deficits at baseline. To be further assessed in functional context as pt becomes easily agitated with questions presented in clinic.  VISION: Subjective report: Pt reports "I can't read anything far" Baseline vision: Pt reports that she wears her reading glasses at all times at baseline    VISION ASSESSMENT: Per chart review, pt has previous visuospatial deficits To be further assessed in functional context   PERCEPTION: Not tested, to be further assessed  PRAXIS: Not tested  OBSERVATIONS: Pt becomes easily agitated with questions during OT assessment   TODAY'S TREATMENT:  Pt was educated in HEP  for putty (grip and pinch using red putty for right hand). Pt was able to perform 10 reps ea after instruction and demonstration in clinic. Pt was educated verbally only perform 1x/day and to keep putty in container provided when not in use and she verbalized understanding of this. Handout was provided. Pt was also instructed briefly in A/ROM exercises for tendon gliding and opposition right hand x10 reps 2-3 times a day. Handout was issued and reviewed in the clinic, pt returned demonstration, verbalized understanding.   PATIENT EDUCATION: Education details: Findings, OT POC and recommendations, red putty for grip/pinch and tendon gliding ex's reviewed and performed in clinic today. Person educated: Patient Education method: Explanation, demonstration, handout,  cues Education comprehension: verbalized understanding and needs further education   HOME EXERCISE PROGRAM: Initial HEP: Instructed in HEP for red putty grip and pinches, tendon gliding exercises. Issued, demonstration, reviewed and performed in clinic today.    GOALS: Goals reviewed with patient? No  LONG TERM GOALS: Target date: 06/20/22  Pt will increase fine motor coordination in RUE by improving 9 hole peg test by at least 3 seconds   Baseline: Right: 27.53 sec; Left: 35.87 sec Goal status: INITIAL  2.  Pt will improve grip strength by at least 5 lbs in RUE for increasing overall functional use for activities of daily living. Baseline: Right: 46.9 lbs; Left: 40.1 lbs Goal status: INITIAL  3.  Pt will be modified independent with HEP for RUE coordination   Baseline: Dependent Goal status: INITIAL  4.  Pt will be Modified independent with HEP for grip and pinch strengthening to assist with improved overall functional use during simulated ADL and IADL tasks Baseline: Dependent Goal status: INITIAL   ASSESSMENT:  CLINICAL IMPRESSION: Patient is a 51 y.o. female who was seen today for occupational therapy evaluation and treatment for impaired coordination & reports of generalized weakness of her right UE following her most recent CVA (cerebrum) on April 10, 2022 (163.033 - ICD 10). Pt appears to be near her baseline level of function and has h/o decreased cognition at baseline. She may benefit from out-patient OT 1x/week for 4 weeks to assist in pt education and instruction in a HEP for coordination and strengthening to assist in [place] with ADL and functional tasks.   PERFORMANCE DEFICITS in functional skills including ADLs, IADLs, coordination, dexterity, strength, FMC, and UE functional use, h/o cognitive impairments/skills including attention, memory, problem solving, temperament/personality, and    psychosocial skills including interpersonal interactions.    IMPAIRMENTS are limiting patient from ADLs and IADLs.   COMORBIDITIES has co-morbidities such as Behavior pattern, Education, Past/current experiences, Social background, Transportation, and 3+ comorbidities: multiple CVAs, substance abuse, HTN  are also affecting patient's functional outcome.  that affects occupational performance. Patient will benefit from skilled OT to address above impairments and improve overall function.  MODIFICATION OR ASSISTANCE TO COMPLETE EVALUATION: No modification of tasks or assist necessary to complete an evaluation.  OT OCCUPATIONAL PROFILE AND HISTORY: Detailed assessment: Review of records and additional review of physical, cognitive, psychosocial history related to current functional performance.  CLINICAL DECISION MAKING: Moderate - several treatment options, min-mod task modification necessary  REHAB POTENTIAL: Fair secondary to h/o multiple CVA's, cognitive status and past out-patient therapy/chart review   EVALUATION COMPLEXITY: Moderate    PLAN: OT FREQUENCY: 1x/week  OT DURATION: 4 weeks  PLANNED INTERVENTIONS: self care/ADL training, therapeutic exercise, therapeutic activity, neuromuscular re-education, fluidotherapy, patient/family education, cognitive remediation/compensation, visual/perceptual remediation/compensation, and DME and/or AE instructions  RECOMMENDED OTHER SERVICES: PT/SLP  CONSULTED AND AGREED WITH PLAN OF CARE: Patient  PLAN FOR NEXT SESSION: Review initial putty HEP for grip and pinches as well as tendon gliding and opposition A/ROM ex's, instruct in HEP for coordination/dexterity.  Patient is participating in a Managed Medicaid Plan:  Yes    Almyra Deforest, OT 05/23/2022, 12:51 PM

## 2022-05-23 ENCOUNTER — Ambulatory Visit: Payer: Medicaid Other | Admitting: Occupational Therapy

## 2022-05-23 ENCOUNTER — Encounter: Payer: Self-pay | Admitting: Occupational Therapy

## 2022-05-23 DIAGNOSIS — R278 Other lack of coordination: Secondary | ICD-10-CM

## 2022-05-23 DIAGNOSIS — I639 Cerebral infarction, unspecified: Secondary | ICD-10-CM

## 2022-05-23 DIAGNOSIS — M6281 Muscle weakness (generalized): Secondary | ICD-10-CM

## 2022-05-23 DIAGNOSIS — R4701 Aphasia: Secondary | ICD-10-CM | POA: Diagnosis not present

## 2022-05-23 DIAGNOSIS — R41844 Frontal lobe and executive function deficit: Secondary | ICD-10-CM

## 2022-05-23 DIAGNOSIS — R41842 Visuospatial deficit: Secondary | ICD-10-CM

## 2022-05-23 DIAGNOSIS — M25641 Stiffness of right hand, not elsewhere classified: Secondary | ICD-10-CM

## 2022-05-23 DIAGNOSIS — M25642 Stiffness of left hand, not elsewhere classified: Secondary | ICD-10-CM

## 2022-05-23 NOTE — Patient Instructions (Signed)
Grip Strengthening (Resistive Putty)    Squeeze putty using thumb and all fingers.   Lateral Pinch Strengthening (Resistive Putty)    Squeeze between thumb and side of each finger in turn. Repeat __10__ times. Do __1-2__ sessions per day.   Three Jaw Chuck Pinch Strengthening (Resistive Putty)    Pull putty, using thumb, index and middle fingers. Repeat _10___ times. Do _1-2__ sessions per day.  Flexor Tendon Gliding (Active Full Fist)    Straighten all fingers, then make a fist, bending all joints.  Flexor Tendon Gliding (Active Hook Fist)    With fingers and knuckles straight, bend middle and tip joints. Do not bend large knuckles.  Flexor Tendon Gliding (Active Straight Fist)    Start with fingers straight. Bend knuckles and middle joints. Keep fingertip joints straight to touch base of palm.   Opposition (Active)    Touch tip of thumb to nail tip of each finger in turn, making an "O" shape.

## 2022-05-24 ENCOUNTER — Telehealth (INDEPENDENT_AMBULATORY_CARE_PROVIDER_SITE_OTHER): Payer: Self-pay | Admitting: Primary Care

## 2022-05-24 NOTE — Telephone Encounter (Signed)
Copied from [place] [id number]. Topic: General - Other >> May 24, 2022 12:55 PM Ludger Nutting wrote: Patient would like a recommendation for an eye doctor that accepts Medicaid. Patient is also requesting a Nurses Aid. Please advise.

## 2022-05-25 NOTE — Telephone Encounter (Signed)
Will forward to provider  

## 2022-05-28 ENCOUNTER — Ambulatory Visit: Payer: Medicaid Other | Attending: Primary Care | Admitting: Speech Pathology

## 2022-05-28 ENCOUNTER — Other Ambulatory Visit (INDEPENDENT_AMBULATORY_CARE_PROVIDER_SITE_OTHER): Payer: Self-pay | Admitting: Primary Care

## 2022-05-28 ENCOUNTER — Ambulatory Visit: Payer: Medicaid Other | Admitting: Physical Therapy

## 2022-05-28 DIAGNOSIS — M25641 Stiffness of right hand, not elsewhere classified: Secondary | ICD-10-CM | POA: Insufficient documentation

## 2022-05-28 DIAGNOSIS — R278 Other lack of coordination: Secondary | ICD-10-CM | POA: Insufficient documentation

## 2022-05-28 DIAGNOSIS — M6281 Muscle weakness (generalized): Secondary | ICD-10-CM | POA: Insufficient documentation

## 2022-05-28 DIAGNOSIS — I69354 Hemiplegia and hemiparesis following cerebral infarction affecting left non-dominant side: Secondary | ICD-10-CM

## 2022-05-28 DIAGNOSIS — I639 Cerebral infarction, unspecified: Secondary | ICD-10-CM | POA: Insufficient documentation

## 2022-05-28 DIAGNOSIS — R41842 Visuospatial deficit: Secondary | ICD-10-CM | POA: Insufficient documentation

## 2022-05-28 DIAGNOSIS — R2689 Other abnormalities of gait and mobility: Secondary | ICD-10-CM | POA: Insufficient documentation

## 2022-05-29 ENCOUNTER — Ambulatory Visit: Payer: Medicaid Other | Admitting: Occupational Therapy

## 2022-05-29 NOTE — Telephone Encounter (Signed)
PCS form from [employer] [employer] has been faxed to [facility]

## 2022-05-29 NOTE — Telephone Encounter (Signed)
I tried to call patient to inform her that the [facility] referral will be sent to [facility], not [facility] [employer].  They should be contacting her in the next couple of weeks to schedule a home assessment and determine if she qualifies for services. She has a managed Medicaid plan so her insurance company will do the assessment, not [employer]  The voicemail was full and I was not able to leave a message.

## 2022-05-29 NOTE — Telephone Encounter (Signed)
Contacted pt to make aware that she will need contact insurance to see who they are in-network with and can call and schedule appt herself. Pt states she understands and doesn't have any questions or concerns

## 2022-05-29 NOTE — Telephone Encounter (Signed)
Patient following up regarding eye doctor referral and would like home health nursing orders sent to [facility]. Patient would like a follow up call today.

## 2022-05-29 NOTE — Telephone Encounter (Signed)
Roberta Sidles do I need to send the order that printed to [facility]

## 2022-05-29 NOTE — Telephone Encounter (Signed)
No you don't need to send that order to [facility].    I did a PCS form and will email to you for Sharyn Lull to sign and then that needs to be faxed to [facility]  along with Michelle's last note.   [employer] is her managed Medicaid plan  Send to : [employer] - fax: [phone]

## 2022-05-30 NOTE — Telephone Encounter (Signed)
I spoke to the patient and informed her that a PCS referral has been sent to [facility].  They should be contacting her in the next couple of weeks to schedule a home assessment and determine if she qualifies for services. She was appreciative.   Please see request in prior encounter for referral to eye doctor

## 2022-05-30 NOTE — Telephone Encounter (Signed)
Patient would like referral placed with [facility] phone # [phone].

## 2022-05-31 ENCOUNTER — Other Ambulatory Visit (INDEPENDENT_AMBULATORY_CARE_PROVIDER_SITE_OTHER): Payer: Self-pay | Admitting: Primary Care

## 2022-05-31 ENCOUNTER — Telehealth (INDEPENDENT_AMBULATORY_CARE_PROVIDER_SITE_OTHER): Payer: Self-pay | Admitting: Primary Care

## 2022-05-31 DIAGNOSIS — E119 Type 2 diabetes mellitus without complications: Secondary | ICD-10-CM

## 2022-05-31 DIAGNOSIS — I639 Cerebral infarction, unspecified: Secondary | ICD-10-CM

## 2022-05-31 DIAGNOSIS — R7303 Prediabetes: Secondary | ICD-10-CM

## 2022-05-31 NOTE — Telephone Encounter (Signed)
Referral Request - Has patient seen PCP for this complaint? yes *If NO, is insurance requiring patient see PCP for this issue before PCP can refer them? Referral for which specialty: eye doctor  Preferred provider/office: [facility] at [facility] Reason for referral: eye exam

## 2022-05-31 NOTE — Telephone Encounter (Signed)
Will forward to provider  

## 2022-05-31 NOTE — Telephone Encounter (Signed)
Pt called in to provide the fax number for [facility].  Fax [phone]   Please advise.

## 2022-06-04 ENCOUNTER — Ambulatory Visit: Payer: Medicaid Other

## 2022-06-04 ENCOUNTER — Ambulatory Visit: Payer: Medicaid Other | Admitting: Speech Pathology

## 2022-06-04 NOTE — Telephone Encounter (Signed)
Pt is calling to ask is there a number that [facility] can verify her insurance? At this time, they are not able to verify her insurance. [phone]

## 2022-06-04 NOTE — Telephone Encounter (Signed)
Returned pt call pt didn't answer lvm

## 2022-06-04 NOTE — Telephone Encounter (Signed)
Pt returned call. Pt states she got everything taken care of

## 2022-06-05 ENCOUNTER — Telehealth (INDEPENDENT_AMBULATORY_CARE_PROVIDER_SITE_OTHER): Payer: Self-pay | Admitting: Primary Care

## 2022-06-05 NOTE — Telephone Encounter (Signed)
Copied from [place] [phone]. Topic: General - Other >> Jun 05, 2022  9:42 AM Cyndi Bender wrote: Reason for CRM: Edmonia James with [employer] stated they received a PCS referral for this patient and they will need the med list faxed over to them at [phone]

## 2022-06-05 NOTE — Telephone Encounter (Signed)
Done

## 2022-06-05 NOTE — Telephone Encounter (Signed)
Claiborne Billings from [facility] called in says needs chart notes from last appt for patient and she says her app is Friday 10/13. [phone] # is

## 2022-06-11 ENCOUNTER — Ambulatory Visit: Payer: Medicaid Other | Admitting: Physical Therapy

## 2022-06-11 ENCOUNTER — Ambulatory Visit: Payer: Medicaid Other | Admitting: Speech Pathology

## 2022-06-11 DIAGNOSIS — M6281 Muscle weakness (generalized): Secondary | ICD-10-CM | POA: Diagnosis present

## 2022-06-11 DIAGNOSIS — I639 Cerebral infarction, unspecified: Secondary | ICD-10-CM | POA: Diagnosis present

## 2022-06-11 DIAGNOSIS — M25641 Stiffness of right hand, not elsewhere classified: Secondary | ICD-10-CM | POA: Diagnosis present

## 2022-06-11 DIAGNOSIS — R278 Other lack of coordination: Secondary | ICD-10-CM | POA: Diagnosis present

## 2022-06-11 DIAGNOSIS — R2689 Other abnormalities of gait and mobility: Secondary | ICD-10-CM

## 2022-06-11 DIAGNOSIS — R41842 Visuospatial deficit: Secondary | ICD-10-CM | POA: Diagnosis present

## 2022-06-11 NOTE — Therapy (Signed)
OUTPATIENT PHYSICAL THERAPY TREATMENT NOTE   Patient Name: Roberta Calderon MRN: [id number] DOB:Nov 01, 1970, 51 y.o., female Today's Date: 06/11/2022  PCP: Juluis Mire, NP  REFERRING PROVIDER: Barb Merino, MD  END OF SESSION:   PT End of Session - 06/11/22 1104     Visit Number 5    Number of Visits 9    Date for PT Re-Evaluation 06/20/22    Authorization Type Medicaid    PT Start Time 1103    PT Stop Time 9211   pt requests to leave early   PT Time Calculation (min) 23 min    Equipment Utilized During Treatment Gait belt    Activity Tolerance Patient tolerated treatment well    Behavior During Therapy [facility] for tasks assessed/performed;Impulsive              Past Medical History:  Diagnosis Date   Hypertension    No past surgical history on file. Patient Active Problem List   Diagnosis Date Noted   Stroke (cerebrum) ([place]) 04/10/2022   Acute CVA (cerebrovascular accident) ([place]) 02/05/2022   Cerebrovascular accident (CVA) of right basal ganglia ([place]) 08/14/2021   Hypertensive urgency 08/14/2021   Cocaine abuse ([place]) 94/17/4081   Metabolic syndrome 44/81/8563   Right sided weakness 08/14/2021   Alcohol abuse 08/14/2021   Prolonged QT interval 08/14/2021   ICH (intracerebral hemorrhage) ([place]) 02/07/2021   Dyslipidemia    Right thalamic infarction ([place]) 04/08/2020   Prediabetes    Polysubstance abuse ([place])    Hemiparesis affecting left side as late effect of stroke ([place])    Ischemic stroke ([place]) 04/06/2020   Hypertension 04/06/2020   Tobacco use disorder 04/06/2020    REFERRING DIAG: J49.702 (ICD-10-CM) - 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 Assisted pt with applying bandage and antibiotic ointment to L knee due to bleeding. Pt reports no other falls or changes since last session.  PAIN:  Are you having pain? Yes: NPRS scale: 6/10 Pain location: L knee Pain description: sore Aggravating factors: walking, movement Relieving factors: rest   TODAY'S TREATMENT:   There were no vitals filed for this visit.   GAIT: Gait pattern: decreased step length- Right, decreased step length- Left, decreased hip/knee flexion- Left, and antalgic Distance walked: 230 ft Assistive device utilized: None Level of assistance: Modified independence Comments: antalgic gait this date due to falling on L knee prior to therapy session  Ambulation through obstacle course stepping over 4" hurdles with no AD and CGA for balance. Pt exhibits decreased L hip and knee flexion when stepping over hurdles with some circumduction noted. Pt states she is unable to increase L hip and knee flexion due to pain in her knee from falling prior to PT session.  THER EX: SciFit level 3 for 8 minutes using BUE/BLEs for neural priming for reciprocal movement, dynamic cardiovascular warmup and increased amplitude of stepping. RPE of 7/10 following activity.    PATIENT EDUCATION: Education details: continue HEP Person educated: Patient Education method: Explanation Education comprehension:  verbalized understanding and needs further education     HOME EXERCISE PROGRAM: Access Code: [email or web address] URL: [email or web address] Date: 05/09/2022 Prepared by: Estevan Ryder  Exercises - Supine Bridge  - 1 x daily - 7 x weekly - 3 sets - 10 reps - Clamshell  - 1 x daily - 7 x weekly - 3 sets - 10  reps - Sit to Stand with Counter Support  - 1 x daily - 7 x weekly - 3 sets - 10 reps - Standing Tandem Balance with Counter Support  - 1 x daily - 7 x weekly - 3 sets - 10 reps - Narrow Stance with Counter Support  - 1 x daily - 7 x weekly - 3 sets - 10 reps - Heel Raises with Counter Support  - 1 x daily - 7 x weekly - 3 sets - 10 reps  **standing exercises to be done with supervision and chair nearby    GOALS: Goals reviewed with patient? Yes   SHORT TERM GOALS: Target date: 05/23/2022   Pt will be independent with initial HEP for improved balance  Baseline: to be provided Goal status: MET   2.  Pt will improve gait speed to >/= 1.27ms with LRAD to demonstrate improved community ambulation   Baseline: 0.870m with no AD and CGA, 0.94 m/s with no AD and S* 99/25) Goal status: IN PROGRESS   3.  Pt will improve FGA to >/= 14/30 to demonstrate improved balance and reduced fall risk   Baseline: 9/30, 14/30 (9/25) Goal status: MET   4.  Pt will improve 5x STS to </= 12 sec to demo improved functional LE strength and balance   Baseline: 16.56s with B UE and CGA, 18.06 s with BUE and S* (9/25) Goal status: NOT MET   5.  Pt will improve TUG to </= 12 secs to demonstrated reduced fall risk   Baseline: 15.59s with no AD and CGA, 10.56 sec (9/25) Goal status: MET       LONG TERM GOALS: Target date: 06/20/2022   Pt will be independent with final HEP for improved balance and functional strength   Baseline: to be provided Goal status: INITIAL   2.  Pt will improve gait speed to >/= 1.49m33mwith LRAD to demonstrate improved community ambulation   Baseline: 0.18m/89mith no AD and CGA, 0.94 m/s Goal status: INITIAL   3.  Pt will improve FGA to >/= 19/30 to demonstrate improved balance and reduced fall risk   Baseline: 9/30. 14/30 Goal status: INITIAL   4.  Pt will improve TUG to </= 9 secs with LRAD to demonstrated reduced fall risk   Baseline: 15.59s with no AD and CGA, 10.56  s Goal status: INITIAL   5.  Pt will improve 5x STS to </= 9 sec with LRAD to demo improved functional LE strength and balance    Baseline: 16.56s with B UE, 18.06 s Goal status: INITIAL   ASSESSMENT:   CLINICAL IMPRESSION: Emphasis of skilled PT session on working on LE strengthening and dynamic balance with gait. Pt's participation limited this session due to L knee pain and needing first aid treatment for L knee due to fall just prior to this appointment. Pt also requesting to leave session early due to L knee pain and is limited in activities she is agreeable to participate in during session due to L knee pain. Pt continues to benefit from skilled therapy services to address higher level balance deficits and decrease her fall risk,  does remain limited by her decreased motivation and participation in therapy sessions as well as decreased compliance with attending scheduled PT visits. Continue POC.   OBJECTIVE IMPAIRMENTS Abnormal gait, cardiopulmonary status limiting activity, decreased activity tolerance, decreased balance, decreased cognition, decreased coordination, decreased endurance, decreased knowledge of condition, decreased knowledge of use of DME, decreased mobility, difficulty walking, decreased strength, decreased safety awareness, and impaired vision/preception.    ACTIVITY LIMITATIONS carrying, lifting, bending, standing, squatting, stairs, transfers, bed mobility, bathing, dressing, hygiene/grooming, locomotion level, and caring for others   PARTICIPATION LIMITATIONS: meal prep, cleaning, laundry, medication management, personal finances, interpersonal relationship, driving, shopping, and community activity   PERSONAL FACTORS Behavior pattern, Education, Past/current experiences, Social background, Transportation, and 3+ comorbidities: multiple CVAs, substance abuse, HTN  are also affecting patient's functional outcome.    REHAB POTENTIAL: Fair h/o multiple CVAs   CLINICAL  DECISION MAKING: Evolving/moderate complexity   EVALUATION COMPLEXITY: Moderate   PLAN: PT FREQUENCY: 1x/week   PT DURATION: 8 weeks   PLANNED INTERVENTIONS: Therapeutic exercises, Therapeutic activity, Neuromuscular re-education, Balance training, Gait training, Patient/Family education, Self Care, Joint mobilization, Stair training, Vestibular training, Visual/preceptual remediation/compensation, Orthotic/Fit training, DME instructions, Aquatic Therapy, Cognitive remediation, Electrical stimulation, Wheelchair mobility training, Manual therapy, and Re-evaluation   PLAN FOR NEXT SESSION: ambulatory balance, assess LTG and discuss POC   Managed medicaid CPT codes: [phone] - PT Re-evaluation, 97110- Therapeutic Exercise, [id number]- Neuro Re-education, [phone] - Gait Training, [id number] - Manual Therapy, [id number] - Therapeutic Activities, [id number] - [place], [id number] - Electrical stimulation (unattended), [id number] - Electrical stimulation (Manual), [place], and [id number] - Aquatic therapy    Excell Seltzer, PT, DPT, CSRS 06/11/2022, 11:28 AM

## 2022-06-15 ENCOUNTER — Other Ambulatory Visit (INDEPENDENT_AMBULATORY_CARE_PROVIDER_SITE_OTHER): Payer: Self-pay | Admitting: Primary Care

## 2022-06-15 ENCOUNTER — Other Ambulatory Visit: Payer: Self-pay

## 2022-06-15 DIAGNOSIS — G4700 Insomnia, unspecified: Secondary | ICD-10-CM

## 2022-06-15 NOTE — Telephone Encounter (Signed)
Requested Prescriptions  Pending Prescriptions Disp Refills  . traZODone (DESYREL) 50 MG tablet 60 tablet 1    Sig: Take 0.5-1 tablets (25-50 mg total) by mouth at bedtime as needed for sleep.     Psychiatry: Antidepressants - Serotonin Modulator Passed - 06/15/2022  1:29 PM      Passed - Valid encounter within last 6 months    Recent Outpatient Visits          1 month ago Type 2 diabetes mellitus without complication, without long-term current use of insulin ([place])   [facility] Juluis Mire P, NP   3 months ago Type 2 diabetes mellitus without complication, without long-term current use of insulin ([place])   [place], Michelle P, NP   4 months ago Prediabetes   [place] Juluis Mire P, NP   5 months ago Stiffness of finger joint of left hand   [place], Michelle P, NP   6 months ago Goiter diffuse   [place] Kerin Perna, NP      Future Appointments            In 1 month Oletta Lamas, [place] Cage, NP [place]

## 2022-06-18 ENCOUNTER — Telehealth: Payer: Self-pay

## 2022-06-18 ENCOUNTER — Ambulatory Visit: Payer: Medicaid Other | Admitting: Physical Therapy

## 2022-06-18 ENCOUNTER — Encounter: Payer: Medicaid Other | Admitting: Speech Pathology

## 2022-06-18 DIAGNOSIS — R2689 Other abnormalities of gait and mobility: Secondary | ICD-10-CM

## 2022-06-18 DIAGNOSIS — R278 Other lack of coordination: Secondary | ICD-10-CM | POA: Diagnosis not present

## 2022-06-18 DIAGNOSIS — I639 Cerebral infarction, unspecified: Secondary | ICD-10-CM

## 2022-06-18 DIAGNOSIS — M6281 Muscle weakness (generalized): Secondary | ICD-10-CM

## 2022-06-18 NOTE — Telephone Encounter (Signed)
Call received from patient stating that she has not had her evaluation for PCS.  She said her phone has not been working and she needed the number to call to check on the referral. I gave her the phone number for [facility] [phone].

## 2022-06-18 NOTE — Therapy (Signed)
OUTPATIENT PHYSICAL THERAPY TREATMENT NOTE-DISCHARGE   Patient Name: Roberta Calderon MRN: [id number] DOB:October 06, 1970, 51 y.o., female Today's Date: 06/18/2022  PHYSICAL THERAPY DISCHARGE SUMMARY  Visits from Start of Care: 6  Current functional level related to goals / functional outcomes: Mod I   Remaining deficits: Ongoing higher level balance impairments and increased fall risk as evidenced by score of 18/30 on FGA   Education / Equipment: Handout and demonstration provided for HEP, no equipment   Patient agrees to discharge. Patient goals were partially met. Patient is being discharged due to maximized rehab potential.     PCP: Juluis Mire, NP  REFERRING PROVIDER: Barb Merino, MD  END OF SESSION:   PT End of Session - 06/18/22 1023     Visit Number 6    Number of Visits 9    Date for PT Re-Evaluation 06/20/22    Authorization Type Medicaid    PT Start Time 1020   therapist ran over with previous patient   PT Stop Time 1048   discharge   PT Time Calculation (min) 28 min    Equipment Utilized During Treatment Gait belt    Activity Tolerance Patient tolerated treatment well    Behavior During Therapy WFL for tasks assessed/performed;Impulsive               Past Medical History:  Diagnosis Date   Hypertension    No past surgical history on file. Patient Active Problem List   Diagnosis Date Noted   Stroke (cerebrum) ([place]) 04/10/2022   Acute CVA (cerebrovascular accident) ([place]) 02/05/2022   Cerebrovascular accident (CVA) of right basal ganglia ([place]) 08/14/2021   Hypertensive urgency 08/14/2021   Cocaine abuse ([place]) 48/18/5631   Metabolic syndrome 49/70/2637   Right sided weakness 08/14/2021   Alcohol abuse 08/14/2021   Prolonged QT interval 08/14/2021   ICH (intracerebral hemorrhage) ([place]) 02/07/2021   Dyslipidemia    Right thalamic infarction ([place]) 04/08/2020   Prediabetes    Polysubstance abuse ([place])    Hemiparesis affecting left side as  late effect of stroke ([place])    Ischemic stroke ([place]) 04/06/2020   Hypertension 04/06/2020   Tobacco use disorder 04/06/2020    REFERRING DIAG: C58.850 (ICD-10-CM) - 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 Pt agreeable to d/c from PT this date.  PAIN:  Are you having pain? No   TODAY'S TREATMENT:   There were no vitals filed for this visit.   THER EX: SciFit multi-peaks level 2 for 8 minutes using BUE/BLEs for neural priming for reciprocal movement, dynamic cardiovascular warmup and increased amplitude of stepping. RPE of "tired"/10 following activity    THER ACT:  [facility] PT Assessment - 06/18/22 1025       Ambulation/Gait   Gait velocity 32.8 ft over 10.5 sec = 3.12 ft/sec      Standardized Balance Assessment   Standardized Balance Assessment Timed Up and Go Test;Five Times Sit to Stand    Five times sit to stand comments  16 sec      Timed Up and Go Test  TUG Normal TUG    Normal TUG (seconds) 10.8      Functional Gait  Assessment   Gait assessed  Yes    Gait Level Surface Walks 20 ft in less than 7 sec but greater than 5.5 sec, uses assistive device, slower speed, mild gait deviations, or deviates 6-10 in outside of the 12 in walkway width.    Change in Gait Speed Able to change speed, demonstrates mild gait deviations, deviates 6-10 in outside of the 12 in walkway width, or no gait deviations, unable to achieve a major change in velocity, or uses a  change in velocity, or uses an assistive device.    Gait with Horizontal Head Turns Performs head turns smoothly with slight change in gait velocity (eg, minor disruption to smooth gait path), deviates 6-10 in outside 12 in walkway width, or uses an assistive device.    Gait with Vertical Head Turns Performs task with slight change in gait velocity (eg, minor disruption to smooth gait path), deviates 6 - 10 in outside 12 in walkway width or uses assistive device    Gait and Pivot Turn Pivot turns safely within 3 sec and stops quickly with no loss of balance.    Step Over Obstacle Is able to step over one shoe box (4.5 in total height) but must slow down and adjust steps to clear box safely. May require verbal cueing.    Gait with Narrow Base of Support Ambulates less than 4 steps heel to toe or cannot perform without assistance.    Gait with Eyes Closed Walks 20 ft, uses assistive device, slower speed, mild gait deviations, deviates 6-10 in outside 12 in walkway width. Ambulates 20 ft in less than 9 sec but greater than 7 sec.    Ambulating Backwards Walks 20 ft, uses assistive device, slower speed, mild gait deviations, deviates 6-10 in outside 12 in walkway width.    Steps Alternating feet, must use rail.    Total Score 18    FGA comment: 18/30              PATIENT EDUCATION: Education details: continue HEP, OM results and functional implications, d/c from PT services Person educated: Patient Education method: Explanation Education comprehension: verbalized understanding     HOME EXERCISE PROGRAM: Access Code: [email or web address] URL: [email or web address] Date: 05/09/2022 Prepared by: Estevan Ryder  Exercises - Supine Bridge  - 1 x daily - 7 x weekly - 3 sets - 10 reps - Clamshell  - 1 x daily - 7 x weekly - 3 sets - 10 reps - Sit to Stand with Counter Support  - 1 x daily - 7 x weekly - 3 sets - 10 reps - Standing Tandem Balance with Counter Support  - 1 x daily - 7 x  weekly - 3 sets - 10 reps - Narrow Stance with Counter Support  - 1 x daily - 7 x weekly - 3 sets - 10 reps - Heel Raises with Counter Support  - 1 x daily - 7 x weekly - 3 sets - 10 reps  **standing exercises to be done with supervision and chair nearby    GOALS: Goals reviewed with patient? Yes   SHORT TERM GOALS: Target date: 05/23/2022   Pt will be independent with initial HEP for improved balance  Baseline: to be provided Goal status: MET   2.  Pt will improve gait speed to >/= 1.52ms with LRAD to demonstrate improved community ambulation   Baseline: 0.839m with no  AD and CGA, 0.94 m/s with no AD and S* 99/25) Goal status: IN PROGRESS   3.  Pt will improve FGA to >/= 14/30 to demonstrate improved balance and reduced fall risk   Baseline: 9/30, 14/30 (9/25) Goal status: MET   4.  Pt will improve 5x STS to </= 12 sec to demo improved functional LE strength and balance   Baseline: 16.56s with B UE and CGA, 18.06 s with BUE and S* (9/25) Goal status: NOT MET   5.  Pt will improve TUG to </= 12 secs to demonstrated reduced fall risk   Baseline: 15.59s with no AD and CGA, 10.56 sec (9/25) Goal status: MET       LONG TERM GOALS: Target date: 06/20/2022   Pt will be independent with final HEP for improved balance and functional strength   Baseline: to be provided Goal status: MET   2.  Pt will improve gait speed to >/= 1.2ms with LRAD to demonstrate improved community ambulation   Baseline: 0.841m with no AD and CGA, 0.94 m/s, 0.95 m/s Goal status: NOT MET   3.  Pt will improve FGA to >/= 19/30 to demonstrate improved balance and reduced fall risk   Baseline: 9/30. 14/30, 18/30 Goal status: NOT MET   4.  Pt will improve TUG to </= 9 secs with LRAD to demonstrated reduced fall risk   Baseline: 15.59s with no AD and CGA, 10.56 s, 10.8 sec with no AD at mod I level Goal status: NOT MET   5.  Pt will improve 5x STS to </= 9 sec with LRAD to demo improved functional LE  strength and balance    Baseline: 16.56s with B UE, 18.06 s, 16 s with BUE support Goal status: NOT MET   ASSESSMENT:   CLINICAL IMPRESSION: Emphasis of skilled PT session on reassessing LTG due to plan to d/c from PT services this visit. Pt has only met 1/5 LTG due to being independent with her HEP. Pt has made progress and has improved her gait speed from 0.8 m/s initially to 0.95 m/s this date but did not meet goal of 1.2 m/s; improved her FGA score from 14/30 to 18/30 but did not quite meet goal of 19/30, improved her TUG score from 15.59 sec initially to 10.56 sec but did not quite meet goal of 9 sec, and improved her 5xSTS score from 16.56 sec initially to 16 sec this date but did not quite meet goal of 9 sec. Though patient has shown improvement in her overall functional LE strength, balance, and exhibits a decreased fall risk she has not quite met LTG set for her due to decreased therapy buy-in and due to not fully participating in therapy sessions. Pt has intermittently attended her sessions and has frequently requested to leave sessions early without reasonable explanation. Pt to d/c from PT services this date to maximizing her rehab potential at this point in time.   OBJECTIVE IMPAIRMENTS Abnormal gait, cardiopulmonary status limiting activity, decreased activity tolerance, decreased balance, decreased cognition, decreased coordination, decreased endurance, decreased knowledge of condition, decreased knowledge of use of DME, decreased mobility, difficulty walking, decreased strength, decreased safety awareness, and impaired vision/preception.    ACTIVITY LIMITATIONS carrying, lifting, bending, standing, squatting, stairs, transfers, bed mobility, bathing, dressing, hygiene/grooming, locomotion level, and caring for others   PARTICIPATION LIMITATIONS: meal prep, cleaning, laundry, medication management, personal finances, interpersonal relationship, driving, shopping, and community  activity   PERSONAL FACTORS Behavior pattern, Education, Past/current experiences, Social background,  Transportation, and 3+ comorbidities: multiple CVAs, substance abuse, HTN  are also affecting patient's functional outcome.    REHAB POTENTIAL: Fair h/o multiple CVAs   CLINICAL DECISION MAKING: Evolving/moderate complexity   EVALUATION COMPLEXITY: Moderate     Managed medicaid CPT codes: [phone] - PT Re-evaluation, 97110- Therapeutic Exercise, [phone]- Neuro Re-education, [phone] - Gait Training, [phone] - Manual Therapy, 97530 - Therapeutic Activities, [phone] - Self Care, [phone] - Electrical stimulation (unattended), [phone] - Electrical stimulation (Manual), [id number] - Orthotic Fit, and [id number] - Aquatic therapy    Excell Seltzer, PT, DPT, CSRS 06/18/2022, 10:49 AM

## 2022-06-19 ENCOUNTER — Other Ambulatory Visit: Payer: Self-pay

## 2022-06-20 ENCOUNTER — Ambulatory Visit: Payer: Medicaid Other | Admitting: Occupational Therapy

## 2022-06-20 ENCOUNTER — Encounter: Payer: Self-pay | Admitting: Occupational Therapy

## 2022-06-20 DIAGNOSIS — R278 Other lack of coordination: Secondary | ICD-10-CM

## 2022-06-20 DIAGNOSIS — R41842 Visuospatial deficit: Secondary | ICD-10-CM

## 2022-06-20 DIAGNOSIS — M6281 Muscle weakness (generalized): Secondary | ICD-10-CM

## 2022-06-20 DIAGNOSIS — M25641 Stiffness of right hand, not elsewhere classified: Secondary | ICD-10-CM

## 2022-06-20 NOTE — Therapy (Signed)
OUTPATIENT OCCUPATIONAL THERAPY NEURO EVALUATION  Patient Name: Roberta Calderon MRN: [id number] DOB:1971/04/16, 51 y.o., female Today's Date: 06/20/2022  PCP: Juluis Mire, NP  REFERRING PROVIDER: Barb Merino, MD   OT End of Session - 06/20/22 1232     Visit Number 2    Number of Visits 5    Date for OT Re-Evaluation 06/20/22    Authorization Type [place] Medicaid - [employer] - needs auth    Authorization - Visit Number 2    Authorization - Number of Visits --   Awaiting auth   Progress Note Due on Visit 5    OT Start Time 1232    OT Stop Time 1315    OT Time Calculation (min) 43 min    Activity Tolerance Patient tolerated treatment well;Other (comment)   pt easily agitated at times with questions during assessment   Behavior During Therapy [facility] for tasks assessed/performed;Restless;Agitated;Impulsive             Past Medical History:  Diagnosis Date   Hypertension    History reviewed. No pertinent surgical history. Patient Active Problem List   Diagnosis Date Noted   Stroke (cerebrum) ([place]) 04/10/2022   Acute CVA (cerebrovascular accident) ([place]) 02/05/2022   Cerebrovascular accident (CVA) of right basal ganglia ([place]) 08/14/2021   Hypertensive urgency 08/14/2021   Cocaine abuse ([place]) 81/08/7508   Metabolic syndrome 25/85/2778   Right sided weakness 08/14/2021   Alcohol abuse 08/14/2021   Prolonged QT interval 08/14/2021   ICH (intracerebral hemorrhage) ([place]) 02/07/2021   Dyslipidemia    Right thalamic infarction ([place]) 04/08/2020   Prediabetes    Polysubstance abuse ([place])    Hemiparesis affecting left side as late effect of stroke ([place])    Ischemic stroke ([place]) 04/06/2020   Hypertension 04/06/2020   Tobacco use disorder 04/06/2020    ONSET DATE: 04/10/22  REFERRING DIAG: E42.353 (ICD-10-CM) - 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 H/o multiple CVA's    PRECAUTIONS: Fall  WEIGHT BEARING RESTRICTIONS No  PAIN:  Are you having pain? No  FALLS: Has patient fallen in last 6 months? Yes. Number of falls 3 pt reports that she uses a RW for longer distances but does not have it with her today.  LIVING ENVIRONMENT: Lives with: Mom Lives in: House/apartment Stairs: No Has following equipment at home: Gilford Rile - 2 wheeled  PLOF: Needs assistance with ADLs and Needs assistance with homemaking at baseline per chart review and pt report. Patient reporting that she required help with all ADLs- at todays appt, patient then reports that she was completely independent with all ADLs prior to this last stroke. Difficult to assess secondary to cognitive impairments at baseline.  PATIENT GOALS Pt reports that she would like to get better use of her right UE.  OBJECTIVE:    DIAGNOSTIC FINDINGS: MRI 04/10/22: Small acute infarct posterior limb internal capsule on the left  HAND DOMINANCE: Right  ADLs: Overall ADLs: Pt has conflicting reports during this assessment, first stating that she required assistance with all ADL's prior to this most recent CVA, then stating that she was independent prior  to this most recent stroke. Transfers/ambulation related to ADLs: Pt presents in clinic without AD, ambulating. Pt reports that her mother currently assists her with cooking, cleaning and ADL's, PRN. Pt states that she got herself dressed today, does grooming etc. Pt states that her mother assists with meal prep and cutting food prior to this CVA. Equipment: none per  pt report.   IADLs: Pt reports that her mother assists with IADL's, pt also states that she does some on her own. Per chart review, pt mother was assisting with IADL's at baseline, prior to 01/2022.  Community mobility: Pt presents w/o AD, but does state that she uses a RW for community distances at times. Medication management: Pt states that her mother assists her PRN at baseline (prior to this CVA) Financial management: Pt mother does this per pt report at baseline (prior to this CVA) Handwriting: Pt signature is 100% legible using regular sized pen. Attempted using larger grip but pt did not prefer this. After signing her name, pt states "I can't do it".  MOBILITY STATUS: Independent, Hx of falls, and pt reports 3 falls in the last 6 months. She is currently being seen by PT.  POSTURE COMMENTS:  No Significant postural limitations  ACTIVITY TOLERANCE: Activity tolerance: Pt becomes easily frustrated at times with questions during this assessment. Appears to be at or near baseline.  FUNCTIONAL OUTCOME MEASURES: FOTO   HAND FUNCTION: Grip strength: Right: 46.9 lbs; Left: 40.1 lbs, Lateral pinch: Right: 11 lbs, Left: 10 lbs, and 3 point pinch: Right: 11 lbs, Left: 10.5 lbs  COORDINATION: 9 Hole Peg test: Right: 27.53 sec; Left: 35.87 sec  SENSATION: Light touch: Pt appears intact to light touch bilateral hands, but she reports paresthesias in her left, digits 1-5 from a previous CVA.  EDEMA: None noted or observed  MUSCLE TONE: N/A  COGNITION: Overall cognitive status: History of cognitive impairments - at baseline. Pt with h/o decreased attention, h/o frontal lobe and executive function deficits at baseline. To be further assessed in functional context as pt becomes easily agitated with questions presented in clinic.  VISION: Subjective report: Pt reports "I can't read anything far" Baseline vision: Pt reports that she wears her reading glasses at all times at baseline     VISION ASSESSMENT: Per chart review, pt has previous visuospatial deficits To be further assessed in functional context   PERCEPTION: Not tested, to be further assessed  PRAXIS: Not tested  OBSERVATIONS: Pt becomes easily agitated with questions during OT assessment   TODAY'S TREATMENT:  Pt issued coordination HEP for bilateral hands (Lt hand w/ decreased coordination, but Rt hand impaired w/ writing). Pt continually reports she can't do it, but when encouraged to try, she could it. Pt easily frustrated and gives up too soon.   Pt practiced tracing letters and shapes with high lighter. Pt then writing cursive L's along page w/ cues to slide forearm. Pt then writing capital letters in print with 95% accuracy. Pt given things to try at home - copying large text, tracing over letters, etc.  *Pt reports she is now doing all of her BADLS   PATIENT EDUCATION: Education details: Coordination HEP, writing strategies  Person educated: Patient Education method: Explanation, Demonstration, Verbal cues, and Handouts Education comprehension: verbalized understanding and returned demonstration     HOME EXERCISE PROGRAM: 05/23/22: Initial HEP: Instructed in HEP for red putty grip and pinches, tendon gliding exercises. Issued, demonstration, reviewed and performed in clinic today. 06/20/22: coordination HEP  GOALS: Goals reviewed with patient? No  LONG TERM GOALS: Target date: 06/20/22  Pt will increase fine motor coordination in RUE by improving 9 hole peg test by at least 3 seconds   Baseline: Right: 27.53 sec; Left: 35.87 sec Goal status: INITIAL  2.  Pt will improve grip strength by at least 5 lbs in RUE for increasing overall functional use for activities of daily living. Baseline: Right: 46.9 lbs; Left: 40.1 lbs Goal status: DEFERRED - not needed  3.  Pt will be modified independent with HEP for RUE coordination   Baseline: Dependent Goal status: MET  4.  Pt will be  Modified independent with HEP for grip and pinch strengthening to assist with improved overall functional use during simulated ADL and IADL tasks Baseline: Dependent Goal status: IN PROGRESS   ASSESSMENT:  CLINICAL IMPRESSION: Patient returns today in over a month after initial evaluation on 05/23/22. Pt easily frustrated and gives up easily. ? Carryover of recommendations at home. Pt also has missed 1-2 O.T. appointments since evaluation. Pt is agreeable to coming back for next session  PERFORMANCE DEFICITS in functional skills including ADLs, IADLs, coordination, dexterity, strength, FMC, and UE functional use, h/o cognitive impairments/skills including attention, memory, problem solving, temperament/personality, and    psychosocial skills including interpersonal interactions.   IMPAIRMENTS are limiting patient from ADLs and IADLs.   COMORBIDITIES has co-morbidities such as Behavior pattern, Education, Past/current experiences, Social background, Transportation, and 3+ comorbidities: multiple CVAs, substance abuse, HTN  are also affecting patient's functional outcome.  that affects occupational performance. Patient will benefit from skilled OT to address above impairments and improve overall function.  MODIFICATION OR ASSISTANCE TO COMPLETE EVALUATION: No modification of tasks or assist necessary to complete an evaluation.  OT OCCUPATIONAL PROFILE AND HISTORY: Detailed assessment: Review of records and additional review of physical, cognitive, psychosocial history related to current functional performance.  CLINICAL DECISION MAKING: Moderate - several treatment options, min-mod task modification necessary  REHAB POTENTIAL: Fair secondary to h/o multiple CVA's, cognitive status and past out-patient therapy/chart review   EVALUATION COMPLEXITY: Moderate    PLAN: OT FREQUENCY: 1x/week  OT DURATION: 4 weeks  PLANNED INTERVENTIONS: self care/ADL training, therapeutic exercise,  therapeutic activity, neuromuscular re-education, fluidotherapy, patient/family education, cognitive remediation/compensation, visual/perceptual remediation/compensation, and DME and/or AE instructions  RECOMMENDED OTHER SERVICES: PT/SLP  CONSULTED AND AGREED WITH PLAN OF CARE: Patient  PLAN FOR NEXT SESSION: review coordination HEP, continue to practice writing. Possible d/c next session  Patient is participating in a Managed Medicaid Plan:  Yes    Hans Eden, OT 06/20/2022, 12:33 PM

## 2022-06-20 NOTE — Patient Instructions (Addendum)
  Coordination Activities  Perform the following activities for 15 minutes 1-2 times per day with BOTH hand(s).  Rotate ball in fingertips (clockwise and counter-clockwise).  Flip cards 1 at a time as fast as you can.  Deal cards with your thumb (Hold deck in hand and push card off top with thumb).  Rotate one card in hand (clockwise and counter-clockwise).  Pick up coins and place in container or coin bank.  Pick up coins one at a time until you get 5 in your hand, then move coins from palm to fingertips to stack one at a time.  Practice tracing and writing with Rt hand only

## 2022-06-25 ENCOUNTER — Ambulatory Visit: Payer: Medicaid Other | Admitting: Physical Therapy

## 2022-06-25 ENCOUNTER — Encounter: Payer: Medicaid Other | Admitting: Speech Pathology

## 2022-06-26 ENCOUNTER — Telehealth (INDEPENDENT_AMBULATORY_CARE_PROVIDER_SITE_OTHER): Payer: Self-pay | Admitting: Primary Care

## 2022-06-26 NOTE — Telephone Encounter (Signed)
Copied from [place] [phone]. Topic: General - Other >> Jun 26, 2022  8:46 AM Chapman Fitch wrote: Reason for CRM: Pt has to get her teeth cleaned and she needs Sharyn Lull to fax a letter to dentist stating that she is no longer taking blood thinner/ please advise [place] dentist / phone # [phone]

## 2022-06-26 NOTE — Telephone Encounter (Signed)
Will forward to provider  

## 2022-06-27 NOTE — Telephone Encounter (Signed)
Will forward to letter

## 2022-06-27 NOTE — Telephone Encounter (Signed)
Patient called back and stated that she does not have any more clopidogrel (PLAVIX) 75 MG tablet. Patient is requesting that the letter be sent to her dentist today. Please advise.

## 2022-06-28 ENCOUNTER — Encounter: Payer: Self-pay | Admitting: Occupational Therapy

## 2022-06-28 ENCOUNTER — Ambulatory Visit: Payer: Medicaid Other | Attending: Primary Care | Admitting: Occupational Therapy

## 2022-06-28 DIAGNOSIS — M25641 Stiffness of right hand, not elsewhere classified: Secondary | ICD-10-CM | POA: Diagnosis present

## 2022-06-28 DIAGNOSIS — R278 Other lack of coordination: Secondary | ICD-10-CM | POA: Insufficient documentation

## 2022-06-28 DIAGNOSIS — R41842 Visuospatial deficit: Secondary | ICD-10-CM | POA: Insufficient documentation

## 2022-06-28 NOTE — Patient Instructions (Signed)
1. Grip Strengthening (Resistive Putty)   Squeeze putty using thumb and all fingers. Repeat _20___ times. Do __2__ sessions per day.  (For now - use orange putty for Lt hand, and green putty for Rt hand)

## 2022-06-28 NOTE — Telephone Encounter (Signed)
Pt called asking if the letter has been faxed to the dentist yet.  CB@   [phone]

## 2022-06-28 NOTE — Therapy (Signed)
OUTPATIENT OCCUPATIONAL THERAPY NEURO TREATMENT  Patient Name: Roberta Calderon MRN: [id number] DOB:Feb 20, 1971, 51 y.o., female Today's Date: 06/28/2022  PCP: Juluis Mire, NP  REFERRING PROVIDER: Barb Merino, MD   OT End of Session - 06/28/22 0848     Visit Number 3    Number of Visits 5    Date for OT Re-Evaluation 06/20/22    Authorization Type [place] Medicaid - [employer] - needs auth    Authorization - Visit Number 3    Authorization - Number of Visits --   Awaiting auth   Progress Note Due on Visit 5    OT Start Time 0845    OT Stop Time 0930    OT Time Calculation (min) 45 min    Activity Tolerance Patient tolerated treatment well;Other (comment)   pt easily agitated at times with questions during assessment   Behavior During Therapy [facility] for tasks assessed/performed;Restless;Agitated;Impulsive             Past Medical History:  Diagnosis Date   Hypertension    History reviewed. No pertinent surgical history. Patient Active Problem List   Diagnosis Date Noted   Stroke (cerebrum) ([place]) 04/10/2022   Acute CVA (cerebrovascular accident) ([place]) 02/05/2022   Cerebrovascular accident (CVA) of right basal ganglia ([place]) 08/14/2021   Hypertensive urgency 08/14/2021   Cocaine abuse ([place]) 78/29/5621   Metabolic syndrome 30/86/5784   Right sided weakness 08/14/2021   Alcohol abuse 08/14/2021   Prolonged QT interval 08/14/2021   ICH (intracerebral hemorrhage) ([place]) 02/07/2021   Dyslipidemia    Right thalamic infarction ([place]) 04/08/2020   Prediabetes    Polysubstance abuse ([place])    Hemiparesis affecting left side as late effect of stroke ([place])    Ischemic stroke ([place]) 04/06/2020   Hypertension 04/06/2020   Tobacco use disorder 04/06/2020    ONSET DATE: 04/10/22  REFERRING DIAG: O96.295 (ICD-10-CM) - 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 I'm getting an aide Pt accompanied by: self  PERTINENT HISTORY: multiple lacunar strokes with residual mild right-sided weakness, uncontrolled hypertension, history of cocaine abuse, current smoker. H/o multiple CVA's    PRECAUTIONS: Fall  WEIGHT BEARING RESTRICTIONS No  PAIN:  Are you having pain? No  FALLS: Has patient fallen in last 6 months? Yes. Number of falls 3 pt reports that she uses a RW for longer distances but does not have it with her today.  LIVING ENVIRONMENT: Lives with: Mom Lives in: House/apartment Stairs: No Has following equipment at home: Gilford Rile - 2 wheeled  PLOF: Needs assistance with ADLs and Needs assistance with homemaking at baseline per chart review and pt report. Patient reporting that she required help with all ADLs- at todays appt, patient then reports that she was completely independent with all ADLs prior to this last stroke. Difficult to assess secondary to cognitive impairments at baseline.  PATIENT GOALS Pt reports that she would like to get better use of her right UE.  OBJECTIVE:    DIAGNOSTIC FINDINGS: MRI 04/10/22: Small acute infarct posterior limb internal capsule on the left  HAND DOMINANCE: Right  ADLs: Overall ADLs: Pt has conflicting reports during this assessment, first stating that she required assistance with all ADL's prior to this most recent CVA, then stating that she was independent  prior to this most recent stroke. Transfers/ambulation related to ADLs: Pt presents in clinic without AD, ambulating. Pt reports that her mother currently assists her with cooking, cleaning and ADL's, PRN. Pt states that she got herself dressed today, does grooming etc. Pt states that her mother assists with meal prep and cutting food prior to this CVA. Equipment: none per pt  report.   IADLs: Pt reports that her mother assists with IADL's, pt also states that she does some on her own. Per chart review, pt mother was assisting with IADL's at baseline, prior to 01/2022.  Community mobility: Pt presents w/o AD, but does state that she uses a RW for community distances at times. Medication management: Pt states that her mother assists her PRN at baseline (prior to this CVA) Financial management: Pt mother does this per pt report at baseline (prior to this CVA) Handwriting: Pt signature is 100% legible using regular sized pen. Attempted using larger grip but pt did not prefer this. After signing her name, pt states "I can't do it".  MOBILITY STATUS: Independent, Hx of falls, and pt reports 3 falls in the last 6 months. She is currently being seen by PT.  POSTURE COMMENTS:  No Significant postural limitations  ACTIVITY TOLERANCE: Activity tolerance: Pt becomes easily frustrated at times with questions during this assessment. Appears to be at or near baseline.  FUNCTIONAL OUTCOME MEASURES: FOTO   HAND FUNCTION: Grip strength: Right: 46.9 lbs; Left: 40.1 lbs, Lateral pinch: Right: 11 lbs, Left: 10 lbs, and 3 point pinch: Right: 11 lbs, Left: 10.5 lbs  COORDINATION: 9 Hole Peg test: Right: 27.53 sec; Left: 35.87 sec  SENSATION: Light touch: Pt appears intact to light touch bilateral hands, but she reports paresthesias in her left, digits 1-5 from a previous CVA.  EDEMA: None noted or observed  MUSCLE TONE: N/A  COGNITION: Overall cognitive status: History of cognitive impairments - at baseline. Pt with h/o decreased attention, h/o frontal lobe and executive function deficits at baseline. To be further assessed in functional context as pt becomes easily agitated with questions presented in clinic.  VISION: Subjective report: Pt reports "I can't read anything far" Baseline vision: Pt reports that she wears her reading glasses at all times at baseline     VISION ASSESSMENT: Per chart review, pt has previous visuospatial deficits To be further assessed in functional context   PERCEPTION: Not tested, to be further assessed  PRAXIS: Not tested  OBSERVATIONS: Pt becomes easily agitated with questions during OT assessment   TODAY'S TREATMENT:  Pt issued putty HEP - (green resistance for Rt hand, red resistance for Lt hand)  Pt has no further questions w/ coordination HEP   Practiced writing name/address x 3 reps w/ 70% legibility (cursive). Pt wrote address in print w/ 90% legibility   Pt copying small peg design Lt hand for [facility] w/ little to no difficulty using Lt hand, and copying design with only 1 v.c. required  Pt practiced hooking/unhooking small buttons w/ both hands. Pt got mad when therapist slid shirt towards her, stating "you don't need to do that; I can do it myself" but then said she can't hook buttons while actually hooking the button. Pt finished hooking buttons on shirt w/o difficulty  Pt easily frustrated whenever asked to do something but wants to continue w/ therapy. Assessed remaining goals - see below. Pt has met goals Pt reports she is in process of getting home aide to assist w/ cooking and cleaning  PATIENT EDUCATION: Education details: Putty HEP  Person educated: Patient Education method: Consulting civil engineer, Media planner, and Handouts Education comprehension: verbalized understanding and returned demonstration    HOME EXERCISE PROGRAM: 05/23/22: Initial HEP: Instructed in HEP for red putty grip and pinches, tendon gliding exercises. Issued, demonstration, reviewed and performed in clinic today. 06/20/22: coordination HEP  06/28/22: Putty HEP    GOALS: Goals reviewed with patient? No  LONG TERM GOALS: Target date: 06/20/22  Pt will increase fine motor coordination in RUE by improving 9 hole peg test by at least 3 seconds   Baseline: Right: 27.53 sec; Left: 35.87 sec Goal status: PARTIALLY MET (Rt = 27 sec,  Lt = 26 sec)   2.  Pt will improve grip strength by at least 5 lbs in RUE for increasing overall functional use for activities of daily living. Baseline: Right: 46.9 lbs; Left: 40.1 lbs Goal status: MET (Rt = 56 lbs, 45 lbs )   3.  Pt will be modified independent with HEP for RUE coordination   Baseline: Dependent Goal status: MET  4.  Pt will be Modified independent with HEP for grip and pinch strengthening to assist with improved overall functional use during simulated ADL and IADL tasks Baseline: Dependent Goal status: MET   ASSESSMENT:  CLINICAL IMPRESSION: Pt has above goals. Pt w/ inconsistent participation in therapy d/t fluctuating frustration  PERFORMANCE DEFICITS in functional skills including ADLs, IADLs, coordination, dexterity, strength, FMC, and UE functional use, h/o cognitive impairments/skills including attention, memory, problem solving, temperament/personality, and    psychosocial skills including interpersonal interactions.   IMPAIRMENTS are limiting patient from ADLs and IADLs.   COMORBIDITIES has co-morbidities such as Behavior pattern, Education, Past/current experiences, Social background, Transportation, and 3+ comorbidities: multiple CVAs, substance abuse, HTN  are also affecting patient's functional outcome.  that affects occupational performance. Patient will benefit from skilled OT to address above impairments and improve overall function.  MODIFICATION OR ASSISTANCE TO COMPLETE EVALUATION: No modification of tasks or assist necessary to complete an evaluation.  OT OCCUPATIONAL PROFILE AND HISTORY: Detailed assessment: Review of records and additional review of physical, cognitive, psychosocial history related to current functional performance.  CLINICAL DECISION MAKING: Moderate - several treatment options, min-mod task modification necessary  REHAB POTENTIAL: Fair secondary to h/o multiple CVA's, cognitive status and past out-patient therapy/chart review    EVALUATION COMPLEXITY: Moderate    PLAN: OT FREQUENCY: 1x/week  OT DURATION: 4 weeks  PLANNED INTERVENTIONS: self care/ADL training, therapeutic exercise, therapeutic activity, neuromuscular re-education, fluidotherapy, patient/family education, cognitive remediation/compensation, visual/perceptual remediation/compensation, and DME and/or AE instructions  RECOMMENDED OTHER SERVICES: PT/SLP  CONSULTED AND AGREED WITH PLAN OF CARE: Patient  PLAN ; D/C OT  OCCUPATIONAL THERAPY DISCHARGE SUMMARY  Visits from Start of Care: 3  Current functional level related to goals / functional outcomes: See above   Remaining deficits: coordination   Education / Equipment: HEP's for coordination and hand strength   Patient agrees to discharge. Patient goals were met. Patient is being discharged due to meeting the stated rehab goals.Hans Eden, OT 06/28/2022, 8:49 AM

## 2022-06-28 NOTE — Telephone Encounter (Signed)
Returned pt call and made aware that we received fax from dentist and it will be faxed back tomorrow when provider does paperwork

## 2022-06-28 NOTE — Telephone Encounter (Signed)
Pt called again requesting to have this faxed today

## 2022-07-02 ENCOUNTER — Ambulatory Visit: Payer: Medicaid Other | Admitting: Physical Therapy

## 2022-07-02 ENCOUNTER — Encounter: Payer: Medicaid Other | Admitting: Speech Pathology

## 2022-07-02 NOTE — Telephone Encounter (Signed)
The patient has called to follow up on their previously discussed paperwork Please contact further when possible

## 2022-07-04 ENCOUNTER — Ambulatory Visit: Payer: Medicaid Other | Admitting: Occupational Therapy

## 2022-07-10 ENCOUNTER — Encounter: Payer: Medicaid Other | Admitting: Occupational Therapy

## 2022-07-17 ENCOUNTER — Encounter: Payer: Medicaid Other | Admitting: Occupational Therapy

## 2022-07-24 ENCOUNTER — Encounter: Payer: Medicaid Other | Admitting: Occupational Therapy

## 2022-07-30 ENCOUNTER — Ambulatory Visit (INDEPENDENT_AMBULATORY_CARE_PROVIDER_SITE_OTHER): Payer: Medicaid Other | Admitting: Primary Care

## 2022-07-30 ENCOUNTER — Encounter (INDEPENDENT_AMBULATORY_CARE_PROVIDER_SITE_OTHER): Payer: Self-pay | Admitting: Primary Care

## 2022-07-30 VITALS — BP 124/86 | HR 70 | Temp 97.5°F | Resp 16 | Ht 62.0 in | Wt 143.0 lb

## 2022-07-30 DIAGNOSIS — Z1159 Encounter for screening for other viral diseases: Secondary | ICD-10-CM | POA: Diagnosis not present

## 2022-07-30 DIAGNOSIS — I10 Essential (primary) hypertension: Secondary | ICD-10-CM | POA: Diagnosis not present

## 2022-07-30 DIAGNOSIS — Z1231 Encounter for screening mammogram for malignant neoplasm of breast: Secondary | ICD-10-CM

## 2022-07-30 DIAGNOSIS — R3981 Functional urinary incontinence: Secondary | ICD-10-CM

## 2022-07-30 DIAGNOSIS — F323 Major depressive disorder, single episode, severe with psychotic features: Secondary | ICD-10-CM

## 2022-07-30 DIAGNOSIS — E119 Type 2 diabetes mellitus without complications: Secondary | ICD-10-CM

## 2022-07-30 DIAGNOSIS — Z1211 Encounter for screening for malignant neoplasm of colon: Secondary | ICD-10-CM

## 2022-07-30 DIAGNOSIS — F1910 Other psychoactive substance abuse, uncomplicated: Secondary | ICD-10-CM | POA: Diagnosis not present

## 2022-07-30 DIAGNOSIS — H6122 Impacted cerumen, left ear: Secondary | ICD-10-CM

## 2022-07-30 DIAGNOSIS — Z23 Encounter for immunization: Secondary | ICD-10-CM

## 2022-07-30 DIAGNOSIS — I69354 Hemiplegia and hemiparesis following cerebral infarction affecting left non-dominant side: Secondary | ICD-10-CM | POA: Diagnosis not present

## 2022-07-30 DIAGNOSIS — Z1239 Encounter for other screening for malignant neoplasm of breast: Secondary | ICD-10-CM

## 2022-07-30 LAB — GLUCOSE, POCT (MANUAL RESULT ENTRY): POC Glucose: 112 mg/dl — AB (ref 70–99)

## 2022-07-30 LAB — POCT GLYCOSYLATED HEMOGLOBIN (HGB A1C): HbA1c, POC (controlled diabetic range): 6.5 % (ref 0.0–7.0)

## 2022-07-30 NOTE — Progress Notes (Signed)
Established Patient Office Visit  Subjective   Patient ID: Roberta Calderon, female    DOB: 1971-08-12  Age: 51 y.o. MRN: [id number]  Chief Complaint  Patient presents with   Diabetes    HPI [name] is a 51 year old overweight female with aphasia- able to understands and voice needs. She presents today for management of type 2 diabetes and hypertension.  Patient denies polyuria, polydipsia, polyphasia or vision changes. Patient has No headache, No chest pain, No abdominal pain - No Nausea, No new weakness tingling or numbness, No Cough - shortness of breath.  She voices concerns at night difficulty trying to get to the bathroom without having incontinence and incidents.  Requesting a bedside commode.  She does admit to having urinary incontinence but continues to have bowel control.  She called family services for referral for depression and [facility] sent her information who to contact for an appointment.  Past Medical History:  Diagnosis Date   Hypertension     ROS Comprehensive ROS Pertinent positive and negative noted in HPI     Objective:   Blood Pressure 124/86 (BP Location: Right Arm, Patient Position: Sitting, Cuff Size: Large)   Pulse 70   Temperature (Abnormal) 97.5 F (36.4 C) (Oral)   Respiration 16   Weight 143 lb (64.9 kg)   Oxygen Saturation 99%   Body Mass Index 26.16 kg/m  BP Readings from Last 3 Encounters:  07/30/22 124/86  05/21/22 129/79  05/07/22 127/83   Physical exam: General: Vital signs reviewed.  Patient is well-developed and well-nourished, overweight female in no acute distress and cooperative with exam. Head: Normocephalic and atraumatic.  Left ear cerumen impaction Eyes: EOMI, conjunctivae normal, no scleral icterus. Neck: Supple, trachea midline, normal ROM, no JVD, masses, thyromegaly, or carotid bruit present. Cardiovascular: RRR, S1 normal, S2 normal, no murmurs, gallops, or rubs. Pulmonary/Chest: Clear to auscultation  bilaterally, no wheezes, rales, or rhonchi. Abdominal: Soft, non-tender, non-distended, BS +, no masses, organomegaly, or guarding present. Musculoskeletal: No joint deformities, erythema, or stiffness, ROM full and nontender. Extremities: No lower extremity edema bilaterally, she has latent bilateral leg weakness history of CVA pulses symmetric and intact bilaterally. No cyanosis or clubbing. Neurological: A&O x3, Strength is normal Skin: Warm, dry and intact. No rashes or erythema. Psychiatric: Normal mood and affect. speech and behavior is normal. Cognition and memory are normal.     Results for orders placed or performed in visit on 07/30/22  HgB A1c  Result Value Ref Range   Hemoglobin A1C     HbA1c POC (<> result, manual entry)     HbA1c, POC (prediabetic range)     HbA1c, POC (controlled diabetic range) 6.5 0.0 - 7.0 %  Glucose (CBG)  Result Value Ref Range   POC Glucose 112 (A) 70 - 99 mg/dl    Last CBC Lab Results  Component Value Date   WBC 6.4 04/10/2022   HGB 12.9 04/10/2022   HGB 11.6 (L) 04/10/2022   HCT 38.0 04/10/2022   HCT 38.2 04/10/2022   MCV 80.3 04/10/2022   MCH 24.4 (L) 04/10/2022   RDW 15.6 (H) 04/10/2022   PLT 340 81/05/3158   Last metabolic panel Lab Results  Component Value Date   GLUCOSE 140 (H) 04/10/2022   GLUCOSE 142 (H) 04/10/2022   NA 140 04/10/2022   NA 140 04/10/2022   K 3.9 04/10/2022   K 3.9 04/10/2022   CL 108 04/10/2022   CL 109 04/10/2022   CO2 21 (  L) 04/10/2022   BUN 19 04/10/2022   BUN 15 04/10/2022   CREATININE 0.60 04/10/2022   CREATININE 0.61 04/10/2022   GFRNONAA >60 04/10/2022   CALCIUM 9.4 04/10/2022   PROT 6.7 04/10/2022   ALBUMIN 3.6 04/10/2022   BILITOT 0.4 04/10/2022   ALKPHOS 81 04/10/2022   AST 27 04/10/2022   ALT 22 04/10/2022   ANIONGAP 10 04/10/2022   Last lipids Lab Results  Component Value Date   CHOL 149 04/11/2022   HDL 46 04/11/2022   LDLCALC 86 04/11/2022   TRIG 87 04/11/2022   CHOLHDL 3.2  04/11/2022      The ASCVD Risk score (Arnett DK, et al., 2019) failed to calculate for the following reasons:   The patient has a prior MI or stroke diagnosis    Assessment & Plan:  [name] was seen today for diabetes.  Diagnoses and all orders for this visit:  Type 2 diabetes mellitus without complication, without long-term current use of insulin ([place]) - educated on lifestyle modifications, including but not limited to diet choices and adding exercise to daily routine.   -     HgB A1c 6.5  -     Glucose (CBG) -     Microalbumin / creatinine urine ratio  Hemiparesis affecting left side as late effect of stroke ([facility]) 2/2 Functional urinary incontinence -     Lipid Panel -     DME Bedside commode  Hypertension, unspecified type BP goal -is neck < 130/80 Explained that having normal blood pressure is the goal and medications are helping to get to goal and maintain normal blood pressure. DIET: Limit salt intake, read nutrition labels to check salt content, limit fried and high fatty foods  Avoid using multisymptom OTC cold preparations that generally contain sudafed which can rise BP. Consult with pharmacist on best cold relief products to use for persons with HTN EXERCISE Discussed incorporating exercise such as walking - 30 minutes most days of the week and can do in 10 minute intervals     Encounter for screening mammogram for malignant neoplasm of breast Health maintenance Referral for mammogram   Colon cancer screening Health maintenance -     Ambulatory referral to Gastroenterology  Encounter for HCV screening test for low risk patient 2/2 Polysubstance abuse ([place]) HCV  Need for Tdap vaccination -     Tdap vaccine greater than or equal to 7yo IM  Hearing loss of left ear due to cerumen impaction Return for ear irrigation unable to remove wax with curette    Current severe episode of major depressive disorder with psychotic features, unspecified whether recurrent  ([place]) [place] Visit from 07/30/2022 in [facility]  PHQ-9 Total Score 19     Referred to clinical social worker and psychiatry.    Return in about 6 months (around 01/29/2023) for pap.    Kerin Perna, NP

## 2022-07-30 NOTE — Progress Notes (Signed)
Would like toilet seat

## 2022-08-01 LAB — LIPID PANEL
Chol/HDL Ratio: 3.6 ratio (ref 0.0–4.4)
Cholesterol, Total: 208 mg/dL — ABNORMAL HIGH (ref 100–199)
HDL: 57 mg/dL (ref >39)
LDL Chol Calc (NIH): 133 mg/dL — ABNORMAL HIGH (ref 0–99)
Triglycerides: 102 mg/dL (ref 0–149)
VLDL Cholesterol Cal: 18 mg/dL (ref 5–40)

## 2022-08-01 LAB — MICROALBUMIN / CREATININE URINE RATIO

## 2022-08-05 ENCOUNTER — Other Ambulatory Visit (INDEPENDENT_AMBULATORY_CARE_PROVIDER_SITE_OTHER): Payer: Self-pay | Admitting: Primary Care

## 2022-08-05 MED ORDER — ATORVASTATIN CALCIUM 40 MG PO TABS
40.0000 mg | ORAL_TABLET | Freq: Every day | ORAL | 1 refills | Status: DC
Start: 2022-08-05 — End: 2023-02-01

## 2022-08-06 ENCOUNTER — Other Ambulatory Visit (HOSPITAL_COMMUNITY)
Admission: RE | Admit: 2022-08-06 | Discharge: 2022-08-06 | Disposition: A | Discharge status: Home / Self Care | Payer: Medicaid Other | Source: Ambulatory Visit | Attending: Primary Care | Admitting: Primary Care

## 2022-08-06 ENCOUNTER — Ambulatory Visit (INDEPENDENT_AMBULATORY_CARE_PROVIDER_SITE_OTHER): Payer: Medicaid Other

## 2022-08-06 ENCOUNTER — Encounter (INDEPENDENT_AMBULATORY_CARE_PROVIDER_SITE_OTHER): Payer: Self-pay | Admitting: Primary Care

## 2022-08-06 ENCOUNTER — Ambulatory Visit (INDEPENDENT_AMBULATORY_CARE_PROVIDER_SITE_OTHER): Payer: Medicaid Other | Admitting: Primary Care

## 2022-08-06 ENCOUNTER — Telehealth (INDEPENDENT_AMBULATORY_CARE_PROVIDER_SITE_OTHER): Payer: Self-pay

## 2022-08-06 VITALS — BP 146/94 | HR 77 | Resp 16 | Wt 141.8 lb

## 2022-08-06 DIAGNOSIS — Z124 Encounter for screening for malignant neoplasm of cervix: Secondary | ICD-10-CM | POA: Diagnosis not present

## 2022-08-06 DIAGNOSIS — Z1239 Encounter for other screening for malignant neoplasm of breast: Secondary | ICD-10-CM

## 2022-08-06 DIAGNOSIS — Z113 Encounter for screening for infections with a predominantly sexual mode of transmission: Secondary | ICD-10-CM | POA: Insufficient documentation

## 2022-08-06 NOTE — Progress Notes (Signed)
Roberta Calderon, is a 51 y.o. female  SNK:[id number]  PFX:[id number]  DOB - Jun 12, 1971  Chief Complaint  Patient presents with   Gynecologic Exam       Subjective:   Roberta Calderon is a 51 y.o. female here today for a follow up visit. Patient has No headache, No chest pain, No abdominal pain - No Nausea, No new weakness tingling or numbness, No Cough - shortness of breath. She states she has had a lump in left breast for 2 years.  No problems updated.  Allergies  Allergen Reactions   Nsaids Nausea And Vomiting and Rash   Aspirin Hives    Past Medical History:  Diagnosis Date   Hypertension     Current Outpatient Medications on File Prior to Visit  Medication Sig Dispense Refill   atorvastatin (LIPITOR) 40 MG tablet Take 1 tablet (40 mg total) by mouth daily. 90 tablet 1   hydrochlorothiazide (HYDRODIURIL) 25 MG tablet Take 1 tablet (25 mg total) by mouth once daily. 90 tablet 3   losartan (COZAAR) 50 MG tablet Take 1 tablet (50 mg total) by mouth once daily. 90 tablet 1   metFORMIN (GLUCOPHAGE-XR) 500 MG 24 hr tablet Take 1 tablet (500 mg total) by mouth daily with breakfast. 90 tablet 1   No current facility-administered medications on file prior to visit.    Objective:   Vitals:   08/06/22 0922  BP: (Abnormal) 146/94  Pulse: 77  Resp: 16  SpO2: 99%  Weight: 141 lb 12.8 oz (64.3 kg)    Exam CONSTITUTIONAL: Well-developed, well-nourished female in no acute distress.  HENT:  Normocephalic, atraumatic, External right and left ear normal. Oropharynx is clear and moist EYES: Conjunctivae and EOM are normal. Pupils are equal, round, and reactive to light. No scleral icterus.  NECK: Normal range of motion, supple, no masses.  Normal thyroid.  SKIN: Skin is warm and dry. No rash noted. Not diaphoretic. No erythema. No pallor. [place]: Alert and oriented to person, place, and time. Normal reflexes, muscle tone coordination. No  cranial nerve deficit noted. PSYCHIATRIC: Normal mood and affect. Normal behavior. Normal judgment and thought content. CARDIOVASCULAR: Normal heart rate noted, regular rhythm RESPIRATORY: Clear to auscultation bilaterally. Effort and breath sounds normal, no problems with respiration noted. BREASTS: Symmetric in size.small masses 3 0 clock , skin changes, nipple drainage, or lymphadenopathy. Taught self breast exam and had patient to demonstrate SBE. ABDOMEN: Soft, normal bowel sounds, no distention noted.  No tenderness, rebound or guarding.  PELVIC: Normal appearing external genitalia; normal appearing vaginal mucosa and cervix.   abnormal discharge noted.  Pap smear obtained.  Normal uterine size, no other palpable masses, no uterine or adnexal tenderness. MUSCULOSKELETAL: Normal range of motion. No tenderness.  No cyanosis, clubbing, or edema.  2+ distal pulses. Data Review Lab Results  Component Value Date   HGBA1C 6.5 07/30/2022   HGBA1C 6.3 (H) 04/11/2022   HGBA1C 6.7 (A) 02/01/2022    Assessment & Plan  Roberta Calderon was seen today for gynecologic exam.  Diagnoses and all orders for this visit:  Cervical cancer screening -     Cytology - PAP  Screening examination for STD (sexually transmitted disease) -     Cervicovaginal ancillary only  Breast screening Small movable mass left 3 o clock near alveloar  -     MM Digital Diagnostic Unilat L; Future  Patient have been counseled extensively about nutrition and exercise. Other issues discussed during this visit include: low  cholesterol diet, weight control and daily exercise, foot care, annual eye examinations at Ophthalmology, importance of adherence with medications and regular follow-up. We also discussed long term complications of uncontrolled diabetes and hypertension.   No follow-ups on file.  The patient was given clear instructions to go to ER or return to medical center if symptoms don't improve, worsen or new problems  develop. The patient verbalized understanding. The patient was told to call to get lab results if they haven't heard anything in the next week.   This note has been created with Surveyor, quantity. Any transcriptional errors are unintentional.   Kerin Perna, NP 08/06/2022, 4:21 PM

## 2022-08-06 NOTE — Telephone Encounter (Signed)
Copied from [place] [id number]. Topic: General - Other >> Aug 06, 2022 12:59 PM Chapman Fitch wrote: Reason for CRM: pt called and asked if Sharyn Lull can call her care giver Ms. Pam at [phone] she needs a paper faxed to her about the pts disability

## 2022-08-07 ENCOUNTER — Other Ambulatory Visit (INDEPENDENT_AMBULATORY_CARE_PROVIDER_SITE_OTHER): Payer: Self-pay | Admitting: Primary Care

## 2022-08-07 DIAGNOSIS — B9689 Other specified bacterial agents as the cause of diseases classified elsewhere: Secondary | ICD-10-CM

## 2022-08-07 LAB — CERVICOVAGINAL ANCILLARY ONLY
Bacterial Vaginitis (gardnerella): POSITIVE — AB
Candida Glabrata: NEGATIVE
Candida Vaginitis: NEGATIVE
Chlamydia: NEGATIVE
Comment: NEGATIVE
Comment: NEGATIVE
Comment: NEGATIVE
Comment: NEGATIVE
Comment: NEGATIVE
Comment: NORMAL
Neisseria Gonorrhea: NEGATIVE
Trichomonas: NEGATIVE

## 2022-08-07 MED ORDER — METRONIDAZOLE 500 MG PO TABS: 500.0000 mg | ORAL_TABLET | Freq: Two times a day (BID) | ORAL | 0 refills | Status: DC

## 2022-08-07 NOTE — Telephone Encounter (Signed)
Returned call to the number provided by pt and lvm

## 2022-08-07 NOTE — Telephone Encounter (Signed)
Pt is requesting an update on the request for PCP to reach out to Ms.Pam. Pt stated she needs this done today. Pt stated Ms.Pam needs to know about her medications.  Ms. Jeannene Patella - [phone]  Please advise.

## 2022-08-08 ENCOUNTER — Other Ambulatory Visit (INDEPENDENT_AMBULATORY_CARE_PROVIDER_SITE_OTHER): Payer: Self-pay | Admitting: Primary Care

## 2022-08-08 DIAGNOSIS — Z1239 Encounter for other screening for malignant neoplasm of breast: Secondary | ICD-10-CM

## 2022-08-09 LAB — CYTOLOGY - PAP
Adequacy: ABSENT
Diagnosis: NEGATIVE

## 2022-08-09 NOTE — Telephone Encounter (Signed)
Pt called back to follow up. Pt stated Ms.Pam sent a fax to PCP the day before yesterday, and she just needs to return it as soon as possible. Pt is requesting a call back.  Please advise.

## 2022-08-10 ENCOUNTER — Encounter (INDEPENDENT_AMBULATORY_CARE_PROVIDER_SITE_OTHER): Payer: Self-pay

## 2022-08-13 ENCOUNTER — Ambulatory Visit
Admission: RE | Admit: 2022-08-13 | Discharge: 2022-08-13 | Disposition: A | Discharge status: Home / Self Care | Payer: Medicaid Other | Source: Ambulatory Visit | Attending: Primary Care | Admitting: Primary Care

## 2022-08-13 ENCOUNTER — Other Ambulatory Visit (INDEPENDENT_AMBULATORY_CARE_PROVIDER_SITE_OTHER): Payer: Self-pay | Admitting: Primary Care

## 2022-08-13 DIAGNOSIS — Z1239 Encounter for other screening for malignant neoplasm of breast: Secondary | ICD-10-CM

## 2022-08-13 DIAGNOSIS — N6320 Unspecified lump in the left breast, unspecified quadrant: Secondary | ICD-10-CM

## 2022-08-13 DIAGNOSIS — N6310 Unspecified lump in the right breast, unspecified quadrant: Secondary | ICD-10-CM

## 2022-08-13 NOTE — Telephone Encounter (Signed)
T called to see if the paperwork wad faxed back to [facility] / she stated she needs this asap / please advise

## 2022-08-14 NOTE — Telephone Encounter (Signed)
Patient is calling back in very upset wanting to speak with PCP regarding her paper work that needs to be faxed back to [facility] today.  Please advise  [phone]

## 2022-08-15 NOTE — Telephone Encounter (Addendum)
Patient is calling back today to see if her paper work has been faxed back to [facility] and is requesting to speak with her PCP. This is this patients sixth time calling in regarding her paperwork. Please call patient back @ [phone] or [phone]

## 2022-08-16 NOTE — Telephone Encounter (Signed)
Patient called back to check to see if the fax has been sent to  [facility]. Called over to the office, per [place] the paper was faxed to [facility] on 05/29/2022 and she has the confirmation that the fax went through.  Advised patient, patient is going to call [facility] and check with them.

## 2022-08-16 NOTE — Telephone Encounter (Signed)
Pt called back again this am.  She needs to have the paper axed for home health care.

## 2022-08-23 ENCOUNTER — Telehealth (INDEPENDENT_AMBULATORY_CARE_PROVIDER_SITE_OTHER): Payer: Self-pay | Admitting: *Deleted

## 2022-08-23 NOTE — Telephone Encounter (Signed)
Message responded to in previous encounter.

## 2022-08-23 NOTE — Telephone Encounter (Signed)
Message responded to in previous encounter(s).  Has been faxed 08/23/2022 and fax confirmation received.

## 2022-08-23 NOTE — Telephone Encounter (Signed)
Message answered already in previous encounter.   Forms were faxed 08/23/2022. Confirmation was received.

## 2022-08-23 NOTE — Telephone Encounter (Signed)
Documents received and faxed 08/23/2022.  Confirmation page received.

## 2022-08-23 NOTE — Telephone Encounter (Signed)
Spoke with Roberta Calderon at [facility] and she states fax has not been received. Please fax # [phone] or email [email or web address]. Please re fax or email.   Pam/Lois from [facility] phone # [phone].   Please note when completed and how.

## 2022-08-30 ENCOUNTER — Telehealth: Payer: Self-pay | Admitting: Primary Care

## 2022-08-30 NOTE — Telephone Encounter (Addendum)
Forms were refaxed.  There were some issues with all pages that were faxed going through on the day it was faxed. Was told this by another patient and organization.   Called [phone] to confirm fax was sent.  Line is busy after multiple attempts several minutes out.

## 2022-08-30 NOTE — Telephone Encounter (Signed)
Copied from [place] [id number]. Topic: General - Other >> Aug 30, 2022 10:36 AM Oley Balm A wrote: Reason for CRM: Patient is calling back stating that [facility] still has not received the fax from her PCP. Called office and spoke with Antionette and faxes have been sent and showing confirmed. Per Antionette please provide a good number. Per patient please call Pam [phone] with [facility] to have papers faxed over.

## 2022-08-30 NOTE — Telephone Encounter (Signed)
Phone number no longer rings busy.  Left message on voicemail asking for a return call to confirm if fax was received.

## 2022-09-12 ENCOUNTER — Telehealth: Payer: Self-pay | Admitting: Primary Care

## 2022-09-12 NOTE — Telephone Encounter (Signed)
-----  Message from Shann Medal, [place] sent at 09/11/2022  1:17 PM EST ----- Regarding: Follow up Please follow up. ----- Message ----- From: Kerin Perna, NP Sent: 07/30/2022  10:00 AM EST To: Shann Medal, LCSW  Patient would benefit from LCSW intervention

## 2022-09-12 NOTE — Telephone Encounter (Signed)
called patient today to introduce Rosana Hoes and to assess patients' mental health needs. Patient did not answer the phone. I was able to leave a brief message with the patient asking them to return the call. Patient was referred by PCP for depression

## 2022-10-10 ENCOUNTER — Telehealth: Payer: Self-pay | Admitting: Primary Care

## 2022-10-10 NOTE — Telephone Encounter (Signed)
Will forward to provider  

## 2022-10-10 NOTE — Telephone Encounter (Signed)
Copied from [place]. Topic: General - Inquiry >> Oct 10, 2022  2:57 PM Marcellus Scott wrote: Reason for CRM: Pt is requesting a call back from Ms.Edwards. Declined to provide details said its urgent.  Please advise.

## 2022-10-11 NOTE — Telephone Encounter (Signed)
Called patient back wanted writer to talk to mother she is not on DPR so only could talk with patient and she had the phone on speaker. Questioned patient the last time using drugs she stated a 11/2 . If she will improve after the stroke critical recovery is afterwards with PT/OT/ST refer further questions to neurology

## 2022-10-25 ENCOUNTER — Telehealth: Payer: Self-pay

## 2022-10-25 NOTE — Telephone Encounter (Signed)
  No return call for pre-visit. Both pre-visit and procedure canceled.

## 2022-10-25 NOTE — Telephone Encounter (Signed)
Called back on mobile number and home phone.  Left message to call back by 5pm to reschedule PV otherwise, the colonoscopy scheduled on 3/18 would be cancelled.

## 2022-10-25 NOTE — Telephone Encounter (Signed)
Called patient for pre visit appointment at 10:30.  No answee and mailbox is full.

## 2022-11-09 ENCOUNTER — Telehealth: Payer: Self-pay

## 2022-11-09 NOTE — Telephone Encounter (Signed)
Patient attempted to be outreached by Sinda Du, PharmD Candidate on 11/09/22 to discuss hypertension. Unable to leave VM as mailbox was full.   Sinda Du, PharmD Candidate   Maryan Puls, PharmD PGY-1 [facility] Resident

## 2022-11-12 ENCOUNTER — Encounter: Payer: Medicaid Other | Admitting: Gastroenterology

## 2022-11-20 NOTE — Telephone Encounter (Signed)
Patient attempted to be outreached by Georga Kaufmann, PharmD Candidate on 11/20/22 to discuss hypertension. Unable to leave voicemail (mailbox full).   Georga Kaufmann, PharmD Candidate   Maryan Puls, PharmD PGY-1 [facility] Resident

## 2022-12-27 ENCOUNTER — Ambulatory Visit (INDEPENDENT_AMBULATORY_CARE_PROVIDER_SITE_OTHER): Payer: Self-pay | Admitting: *Deleted

## 2022-12-27 NOTE — Telephone Encounter (Signed)
Reason for Disposition  [1] Loss of speech or garbled speech AND [2] sudden onset AND [3] present now  Answer Assessment - Initial Assessment Questions 1. SYMPTOM: "What is the main symptom you are concerned about?" (e.g., weakness, numbness)     Mouth twisted- not speaking normal, weakness 2. ONSET: "When did this start?" (minutes, hours, days; while sleeping)     recent 3. LAST NORMAL: "When was the last time you (the patient) were normal (no symptoms)?"     Over 1 month worse 4. PATTERN "Does this come and go, or has it been constant since it started?"  "Is it present now?"     Comes and goes 5. CARDIAC SYMPTOMS: "Have you had any of the following symptoms: chest pain, difficulty breathing, palpitations?"     Chest pain 6. NEUROLOGIC SYMPTOMS: "Have you had any of the following symptoms: headache, dizziness, vision loss, double vision, changes in speech, unsteady on your feet?"     Changes in speech/mouth twisted  Protocols used: Neurologic Deficit-A-AH

## 2022-12-27 NOTE — Telephone Encounter (Signed)
FYI

## 2022-12-27 NOTE — Telephone Encounter (Signed)
  Chief Complaint: neurologic symptoms- mouth "twisted" Symptoms: patient is afraid she has had stroke- her mouth is "twisted", she has speech changes, chest pain that comes and goes Frequency: patient states she has been having symptoms over 1 month- but they are getting worse  Disposition: [x] ED /[] Urgent Care (no appt availability in office) / [] Appointment(In office/virtual)/ []  [place] [facility]/ [] Home Care/ [] Refused Recommended Disposition /[] [place] [facility]/ []  Follow-up with PCP Additional Notes: Patient advised to go to ED now- call 911 if she can not get there quickly

## 2023-01-15 ENCOUNTER — Ambulatory Visit (INDEPENDENT_AMBULATORY_CARE_PROVIDER_SITE_OTHER): Payer: Self-pay | Admitting: *Deleted

## 2023-01-15 ENCOUNTER — Other Ambulatory Visit (INDEPENDENT_AMBULATORY_CARE_PROVIDER_SITE_OTHER): Payer: Self-pay | Admitting: Primary Care

## 2023-01-15 ENCOUNTER — Ambulatory Visit
Admission: RE | Admit: 2023-01-15 | Discharge: 2023-01-15 | Disposition: A | Discharge status: Home / Self Care | Payer: Medicaid Other | Source: Ambulatory Visit | Attending: Primary Care | Admitting: Primary Care

## 2023-01-15 DIAGNOSIS — Z1239 Encounter for other screening for malignant neoplasm of breast: Secondary | ICD-10-CM

## 2023-01-15 DIAGNOSIS — N6310 Unspecified lump in the right breast, unspecified quadrant: Secondary | ICD-10-CM

## 2023-01-15 DIAGNOSIS — N6320 Unspecified lump in the left breast, unspecified quadrant: Secondary | ICD-10-CM

## 2023-01-15 NOTE — Telephone Encounter (Signed)
Noted  

## 2023-01-15 NOTE — Telephone Encounter (Signed)
Chief Complaint: depression , requesting medication Symptoms: depression, sleeping difficulty, trouble concentrating, thoughts of hurting partner that is living with her. Each live in different areas of house. Chest pain trouble breathing at times. No appetite. Drinks fluids but does not drink water. Frequency: "for a while" Pertinent Negatives: Patient denies plan of hurting someone else.  Disposition: [] ED /[] Urgent Care (no appt availability in office) / [x] Appointment(In office/virtual)/ []  [place] [facility]/ [] Home Care/ [] Refused Recommended Disposition /[] [place] [facility]/ []  Follow-up with PCP Additional Notes:   Recommended to call or go to urgent crisis center gave # [phone] and advised to text [phone] if needed for more urgent needs. Advise patient if she is having chest pain difficulty breathing go to ED. Call police if needed if she feels she will hurt someone else. Appt scheduled for tomorrow with PCP. Please advise for additional recommendations.     Reason for Disposition  [1] Depression AND [2] worsening (e.g., sleeping poorly, less able to do activities of daily living)  Answer Assessment - Initial Assessment Questions 1. CONCERN: "What happened that made you call today?"     Feeling depressed 2. DEPRESSION SYMPTOM SCREENING: "How are you feeling overall?" (e.g., decreased energy, increased sleeping or difficulty sleeping, difficulty concentrating, feelings of sadness, guilt, hopelessness, or worthlessness)     Difficulty sleeping trouble sleeping  3. RISK OF HARM - SUICIDAL IDEATION:  "Do you ever have thoughts of hurting or killing yourself?"  (e.g., yes, no, no but preoccupation with thoughts about death)   - INTENT:  "Do you have thoughts of hurting or killing yourself right NOW?" (e.g., yes, no, N/A)   - PLAN: "Do you have a specific plan for how you would do this?" (e.g., gun, knife, overdose, no plan, N/A)     Thoughts of hurting someone else that is  living with her . No plan reported  4. RISK OF HARM - HOMICIDAL IDEATION:  "Do you ever have thoughts of hurting or killing someone else?"  (e.g., yes, no, no but preoccupation with thoughts about death)   - INTENT:  "Do you have thoughts of hurting or killing someone right NOW?" (e.g., yes, no, N/A)   - PLAN: "Do you have a specific plan for how you would do this?" (e.g., gun, knife, no plan, N/A)      Hurting someone else no plan reported  5. FUNCTIONAL IMPAIRMENT: "How have things been going for you overall? Have you had more difficulty than usual doing your normal daily activities?"  (e.g., better, same, worse; self-care, school, work, interactions)     Overall depressed  6. SUPPORT: "Who is with you now?" "Who do you live with?" "Do you have family or friends who you can talk to?"      Daughter  7. THERAPIST: "Do you have a counselor or therapist? Name?"     na 8. STRESSORS: "Has there been any new stress or recent changes in your life?"     Partner living with patient causes stress 9. ALCOHOL USE OR SUBSTANCE USE (DRUG USE): "Do you drink alcohol or use any illegal drugs?"     No  10. OTHER: "Do you have any other physical symptoms right now?" (e.g., fever)       Chest pain trouble breathing at times. Crying depressed  no appetite at times. Does not drink water but is drinking fluids  11. PREGNANCY: "Is there any chance you are pregnant?" "When was your last menstrual period?"  na  Protocols used: Depression-A-AH

## 2023-01-16 ENCOUNTER — Ambulatory Visit (INDEPENDENT_AMBULATORY_CARE_PROVIDER_SITE_OTHER): Payer: Medicaid Other | Admitting: Primary Care

## 2023-01-16 ENCOUNTER — Encounter (INDEPENDENT_AMBULATORY_CARE_PROVIDER_SITE_OTHER): Payer: Self-pay | Admitting: Primary Care

## 2023-01-16 VITALS — BP 150/85 | HR 78 | Resp 16 | Wt 144.4 lb

## 2023-01-16 DIAGNOSIS — Z1211 Encounter for screening for malignant neoplasm of colon: Secondary | ICD-10-CM

## 2023-01-16 DIAGNOSIS — G4700 Insomnia, unspecified: Secondary | ICD-10-CM | POA: Diagnosis not present

## 2023-01-16 DIAGNOSIS — F418 Other specified anxiety disorders: Secondary | ICD-10-CM

## 2023-01-16 MED ORDER — TRAZODONE HCL 50 MG PO TABS
25.0000 mg | ORAL_TABLET | Freq: Every day | ORAL | 1 refills | Status: DC
Start: 2023-01-16 — End: 2023-02-01

## 2023-01-16 NOTE — Progress Notes (Signed)
     [facility]        Subjective:   NONNIE LEBRUN is an 52 y.o. female who presents for evaluation and treatment of depressive symptoms.  Onset approximately 4 months ago, gradually worsening since that time.  Current symptoms include depressed mood, insomnia, fatigue, difficulty concentrating, anxiety, disturbed sleep, weight gain, decreased labido, increased appetite,.  Current treatment for depression:None Sleep problems: Moderate   Early awakening:Moderate   Energy: Poor Motivation: Poor Concentration: Poor Rumination/worrying: Moderate Memory: Poor Tearfulness: Moderate  Anxiety: Moderate  Panic: Moderate  Overall Mood: Moderately worse  Hopelessness: Absent Suicidal ideation: Absent  Family history positive for depression in the patient's unknown.  Previous treatment modalities employed include None.  Past episodes of depression:no Organic causes of depression present: None.  Review of Systems Pertinent items noted in HPI and remainder of comprehensive ROS otherwise negative.   Objective:   Mental Status Examination: Posture and motor behavior: Appropriate Dress, grooming, personal hygiene: Appropriate Facial expression: Appropriate Speech: Appropriate Mood: Appropriate Coherency and relevance of thought: Appropriate Thought content: Appropriate Perceptions: Appropriate Orientation:Appropriate Attention and concentration: Appropriate Memory: : Appropriate Information: Not examined Vocabulary: Appropriate  Assessment:   Experiencing the following symptoms of depression most of the day nearly every day for more than two consecutive weeks: depressed mood, change in sleep, change in appetite or weight, loss of energy, trouble concentrating    Suicide Risk Assessment:  Suicidal intent: None Suicidal plan: None Access to means for suicide: probable  Lethality of means for suicide: none Prior suicide attempts: none Recent  exposure to suicide:none   Plan:   Kanesia was seen today for depression.  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  Colon cancer screening -     Ambulatory referral to Gastroenterology    Reviewed concept of depression as biochemical imbalance of neurotransmitters and rationale for treatment. Instructed patient to contact office or on-call physician promptly should condition worsen or any new symptoms appear and provided on-call telephone numbers.    This note has been created with Education officer, environmental. Any transcriptional errors are unintentional.   Grayce Sessions, NP 01/16/2023, 11:15 AM

## 2023-01-16 NOTE — Patient Instructions (Signed)
Trazodone Tablets What is this medication? TRAZODONE (TRAZ oh done) treats depression. It increases the amount of serotonin in the brain, a hormone that helps regulate mood. This medicine may be used for other purposes; ask your health care provider or pharmacist if you have questions. COMMON BRAND NAME(S): Desyrel What should I tell my care team before I take this medication? They need to know if you have any of these conditions: Attempted suicide or thinking about it Bipolar disorder Bleeding problems Glaucoma Heart disease, or previous heart attack Irregular heart beat Kidney or liver disease Low levels of sodium in the blood An unusual or allergic reaction to trazodone, other medications, foods, dyes or preservatives Pregnant or trying to get pregnant Breast-feeding How should I use this medication? Take this medication by mouth with a glass of water. Follow the directions on the prescription label. Take this medication shortly after a meal or a light snack. Take your medication at regular intervals. Do not take your medication more often than directed. Do not stop taking this medication suddenly except upon the advice of your care team. Stopping this medication too quickly may cause serious side effects or your condition may worsen. A special MedGuide will be given to you by the pharmacist with each prescription and refill. Be sure to read this information carefully each time. Talk to your care team regarding the use of this medication in children. Special care may be needed. Overdosage: If you think you have taken too much of this medicine contact a poison control center or emergency room at once. NOTE: This medicine is only for you. Do not share this medicine with others. What if I miss a dose? If you miss a dose, take it as soon as you can. If it is almost time for your next dose, take only that dose. Do not take double or extra doses. What may interact with this medication? Do not  take this medication with any of the following: Certain medications for fungal infections like fluconazole, itraconazole, ketoconazole, posaconazole, voriconazole Cisapride Dronedarone Linezolid MAOIs like Carbex, Eldepryl, Marplan, Nardil, and Parnate Mesoridazine Methylene blue (injected into a vein) Pimozide Saquinavir Thioridazine This medication may also interact with the following: Alcohol Antiviral medications for HIV or AIDS Aspirin and aspirin-like medications Barbiturates like phenobarbital Certain medications for blood pressure, heart disease, irregular heart beat Certain medications for depression, anxiety, or psychotic disturbances Certain medications for migraine headache like almotriptan, eletriptan, frovatriptan, naratriptan, rizatriptan, sumatriptan, zolmitriptan Certain medications for seizures like carbamazepine and phenytoin Certain medications for sleep Certain medications that treat or prevent blood clots like dalteparin, enoxaparin, warfarin Digoxin Fentanyl Lithium NSAIDS, medications for pain and inflammation, like ibuprofen or naproxen Other medications that prolong the QT interval (cause an abnormal heart rhythm) like dofetilide Rasagiline Supplements like St. John's wort, kava kava, valerian Tramadol Tryptophan This list may not describe all possible interactions. Give your health care provider a list of all the medicines, herbs, non-prescription drugs, or dietary supplements you use. Also tell them if you smoke, drink alcohol, or use illegal drugs. Some items may interact with your medicine. What should I watch for while using this medication? Tell your care team if your symptoms do not get better or if they get worse. Visit your care team for regular checks on your progress. Because it may take several weeks to see the full effects of this medication, it is important to continue your treatment as prescribed by your care team. Watch for new or worsening  thoughts of   suicide or depression. This includes sudden changes in mood, behaviors, or thoughts. These changes can happen at any time but are more common in the beginning of treatment or after a change in dose. Call your care team right away if you experience these thoughts or worsening depression. Manic episodes may happen in patients with bipolar disorder who take this medication. Watch for changes in feelings or behaviors such as feeling anxious, nervous, agitated, panicky, irritable, hostile, aggressive, impulsive, severely restless, overly excited and hyperactive, or trouble sleeping. These changes can happen at any time but are more common in the beginning of treatment or after a change in dose. Call your care team right away if you notice any of these symptoms. You may get drowsy or dizzy. Do not drive, use machinery, or do anything that needs mental alertness until you know how this medication affects you. Do not stand or sit up quickly, especially if you are an older patient. This reduces the risk of dizzy or fainting spells. Alcohol may interfere with the effect of this medication. Avoid alcoholic drinks. This medication may cause dry eyes and blurred vision. If you wear contact lenses you may feel some discomfort. Lubricating drops may help. See your eye doctor if the problem does not go away or is severe. Your mouth may get dry. Chewing sugarless gum, sucking hard candy and drinking plenty of water may help. Contact your care team if the problem does not go away or is severe. What side effects may I notice from receiving this medication? Side effects that you should report to your care team as soon as possible: Allergic reactions--skin rash, itching, hives, swelling of the face, lips, tongue, or throat Bleeding--bloody or black, tar-like stools, red or dark brown urine, vomiting blood or brown material that looks like coffee grounds, small, red or purple spots on skin, unusual bleeding or  bruising Heart rhythm changes--fast or irregular heartbeat, dizziness, feeling faint or lightheaded, chest pain, trouble breathing Low blood pressure--dizziness, feeling faint or lightheaded, blurry vision Low sodium level--muscle weakness, fatigue, dizziness, headache, confusion Prolonged or painful erection Serotonin syndrome--irritability, confusion, fast or irregular heartbeat, muscle stiffness, twitching muscles, sweating, high fever, seizures, chills, vomiting, diarrhea Sudden eye pain or change in vision such as blurry vision, seeing halos around lights, vision loss Thoughts of suicide or self-harm, worsening mood, feelings of depression Side effects that usually do not require medical attention (report to your care team if they continue or are bothersome): Change in sex drive or performance Constipation Dizziness Drowsiness Dry mouth This list may not describe all possible side effects. Call your doctor for medical advice about side effects. You may report side effects to FDA at 1-800-FDA-1088. Where should I keep my medication? Keep out of the reach of children and pets. Store at room temperature between 15 and 30 degrees C (59 to 86 degrees F). Protect from light. Keep container tightly closed. Throw away any unused medication after the expiration date. NOTE: This sheet is a summary. It may not cover all possible information. If you have questions about this medicine, talk to your doctor, pharmacist, or health care provider.  2023 Elsevier/Gold Standard (2007-10-04 00:00:00)  

## 2023-01-29 ENCOUNTER — Ambulatory Visit (INDEPENDENT_AMBULATORY_CARE_PROVIDER_SITE_OTHER): Payer: Self-pay | Admitting: Primary Care

## 2023-01-30 ENCOUNTER — Ambulatory Visit (INDEPENDENT_AMBULATORY_CARE_PROVIDER_SITE_OTHER): Payer: Self-pay | Admitting: *Deleted

## 2023-01-30 NOTE — Telephone Encounter (Signed)
  Chief Complaint: left arm numb unable to use Symptoms: left arm numb unable to use. Hx stroke and right arm affected.  Frequency: started yesterday  Pertinent Negatives: Patient denies left leg weakness  Disposition: [x] ED /[] Urgent Care (no appt availability in office) / [] Appointment(In office/virtual)/ []  [place] [facility]/ [] Home Care/ [] Refused Recommended Disposition /[] [place] [facility]/ []  Follow-up with PCP Additional Notes:   Recommended call 911 go to ED now.    Reason for Disposition  [1] Numbness (i.e., loss of sensation) of the face, arm / hand, or leg / foot on one side of the body AND [2] sudden onset AND [3] present now  Answer Assessment - Initial Assessment Questions 1. SYMPTOM: "What is the main symptom you are concerned about?" (e.g., weakness, numbness)     Left arm numbess can not use  2. ONSET: "When did this start?" (minutes, hours, days; while sleeping)     Yesterday  3. LAST NORMAL: "When was the last time you (the patient) were normal (no symptoms)?"     Yesterday  4. PATTERN "Does this come and go, or has it been constant since it started?"  "Is it present now?"     Present now  5. CARDIAC SYMPTOMS: "Have you had any of the following symptoms: chest pain, difficulty breathing, palpitations?"     Na  6. NEUROLOGIC SYMPTOMS: "Have you had any of the following symptoms: headache, dizziness, vision loss, double vision, changes in speech, unsteady on your feet?"     Hx stroke right arm weakness, now left arm numb and unable to use 7. OTHER SYMPTOMS: "Do you have any other symptoms?"     Hx stroke  8. PREGNANCY: "Is there any chance you are pregnant?" "When was your last menstrual period?"     na  Protocols used: Neurologic Deficit-A-AH

## 2023-01-30 NOTE — Telephone Encounter (Signed)
Will forward to provider  

## 2023-01-31 ENCOUNTER — Ambulatory Visit (INDEPENDENT_AMBULATORY_CARE_PROVIDER_SITE_OTHER): Payer: Self-pay

## 2023-01-31 ENCOUNTER — Emergency Department (HOSPITAL_COMMUNITY): Payer: Medicaid Other

## 2023-01-31 ENCOUNTER — Inpatient Hospital Stay (HOSPITAL_COMMUNITY)
Admission: EM | Admit: 2023-01-31 | Discharge: 2023-02-04 | DRG: 065 | Disposition: A | Discharge status: Rehabilitation Facility | Payer: Medicaid Other | Attending: Family Medicine | Admitting: Family Medicine

## 2023-01-31 ENCOUNTER — Other Ambulatory Visit: Payer: Self-pay

## 2023-01-31 ENCOUNTER — Encounter (HOSPITAL_COMMUNITY): Payer: Self-pay | Admitting: Pharmacy Technician

## 2023-01-31 DIAGNOSIS — I69351 Hemiplegia and hemiparesis following cerebral infarction affecting right dominant side: Secondary | ICD-10-CM

## 2023-01-31 DIAGNOSIS — I639 Cerebral infarction, unspecified: Secondary | ICD-10-CM

## 2023-01-31 DIAGNOSIS — R29810 Facial weakness: Secondary | ICD-10-CM | POA: Diagnosis present

## 2023-01-31 DIAGNOSIS — F32A Depression, unspecified: Secondary | ICD-10-CM | POA: Diagnosis present

## 2023-01-31 DIAGNOSIS — Z7984 Long term (current) use of oral hypoglycemic drugs: Secondary | ICD-10-CM

## 2023-01-31 DIAGNOSIS — E876 Hypokalemia: Secondary | ICD-10-CM | POA: Diagnosis present

## 2023-01-31 DIAGNOSIS — I10 Essential (primary) hypertension: Secondary | ICD-10-CM | POA: Diagnosis present

## 2023-01-31 DIAGNOSIS — E119 Type 2 diabetes mellitus without complications: Secondary | ICD-10-CM | POA: Diagnosis present

## 2023-01-31 DIAGNOSIS — E785 Hyperlipidemia, unspecified: Secondary | ICD-10-CM | POA: Diagnosis present

## 2023-01-31 DIAGNOSIS — F17210 Nicotine dependence, cigarettes, uncomplicated: Secondary | ICD-10-CM | POA: Diagnosis present

## 2023-01-31 DIAGNOSIS — Z91148 Patient's other noncompliance with medication regimen for other reason: Secondary | ICD-10-CM

## 2023-01-31 DIAGNOSIS — G8194 Hemiplegia, unspecified affecting left nondominant side: Secondary | ICD-10-CM | POA: Diagnosis present

## 2023-01-31 DIAGNOSIS — I6381 Other cerebral infarction due to occlusion or stenosis of small artery: Principal | ICD-10-CM | POA: Diagnosis present

## 2023-01-31 DIAGNOSIS — Z79899 Other long term (current) drug therapy: Secondary | ICD-10-CM

## 2023-01-31 DIAGNOSIS — Z886 Allergy status to analgesic agent status: Secondary | ICD-10-CM

## 2023-01-31 LAB — CBC WITH DIFFERENTIAL/PLATELET
Abs Immature Granulocytes: 0.01 K/uL (ref 0.00–0.07)
Basophils Absolute: 0 K/uL (ref 0.0–0.1)
Basophils Relative: 0 %
Eosinophils Absolute: 0.2 K/uL (ref 0.0–0.5)
Eosinophils Relative: 3 %
HCT: 43 % (ref 36.0–46.0)
Hemoglobin: 13.4 g/dL (ref 12.0–15.0)
Immature Granulocytes: 0 %
Lymphocytes Relative: 47 %
Lymphs Abs: 3.3 K/uL (ref 0.7–4.0)
MCH: 24.4 pg — ABNORMAL LOW (ref 26.0–34.0)
MCHC: 31.2 g/dL (ref 30.0–36.0)
MCV: 78.2 fL — ABNORMAL LOW (ref 80.0–100.0)
Monocytes Absolute: 0.4 K/uL (ref 0.1–1.0)
Monocytes Relative: 6 %
Neutro Abs: 3.1 K/uL (ref 1.7–7.7)
Neutrophils Relative %: 44 %
Platelets: 339 K/uL (ref 150–400)
RBC: 5.5 MIL/uL — ABNORMAL HIGH (ref 3.87–5.11)
RDW: 14 % (ref 11.5–15.5)
WBC: 7.1 K/uL (ref 4.0–10.5)
nRBC: 0 % (ref 0.0–0.2)

## 2023-01-31 LAB — BASIC METABOLIC PANEL WITH GFR
Anion gap: 17 — ABNORMAL HIGH (ref 5–15)
BUN: 11 mg/dL (ref 6–20)
CO2: 22 mmol/L (ref 22–32)
Calcium: 9.8 mg/dL (ref 8.9–10.3)
Chloride: 98 mmol/L (ref 98–111)
Creatinine, Ser: 0.72 mg/dL (ref 0.44–1.00)
GFR, Estimated: >60 mL/min
Glucose, Bld: 122 mg/dL — ABNORMAL HIGH (ref 70–99)
Potassium: 3.7 mmol/L (ref 3.5–5.1)
Sodium: 137 mmol/L (ref 135–145)

## 2023-01-31 LAB — TROPONIN I (HIGH SENSITIVITY): Troponin I (High Sensitivity): 8 ng/L

## 2023-01-31 LAB — CBG MONITORING, ED: Glucose-Capillary: 129 mg/dL — ABNORMAL HIGH (ref 70–99)

## 2023-01-31 NOTE — Telephone Encounter (Signed)
FYI

## 2023-01-31 NOTE — Telephone Encounter (Signed)
Per agent, Pt states she is about to have a nervous breakdown. Call disconnected prior to transfer.   Called pt back call went to voice mail. Mail box is full unable to [facility].  Called mother -  Mother is not with pt. And is unsure of her current status, but states that she might be having a nervous breakdown. Mother is leaving work shortly and will be heading home. Pt and mother live on the same property in different homes.  Mother gave another phone number to try.   Called new number [phone]. - left message  Called 911 for wellness check.[place] will do a wellness check on pt.

## 2023-01-31 NOTE — ED Triage Notes (Signed)
Pt bib ems with L sided weakness and slurred speech onset yesterday at 0700. Pt with hx of CVA X4. States she waited to see if the symptoms would get better. VSS with EMS.

## 2023-01-31 NOTE — ED Provider Triage Note (Signed)
Emergency Medicine Provider Triage Evaluation Note  Roberta Roberta , a 52 y.o. female  was evaluated in triage.  Pt complains of multiple findings today.  She initially states that she has not been able to feel her left hand since 4 PM yesterday.  She states that she has normal motion but is having severe sensory deficits feels like it is completely numb.  History of CVA on the contralateral side.  Denies any other localizing signs or symptoms related to this.  Does not have any facial symptoms nor lower extremity symptoms.  She is otherwise at her mental status baseline. Additionally, patient states the chest pain in her left anterior chest has been present for the past 3 days.  It is persistent and unchanging during this timeframe.  Review of Systems  Positive: Hand numbness, chest pain Negative:   Physical Exam  BP (!) 150/85 (BP Location: Right Arm)   Pulse 79   Temp 98.3 F (36.8 C)   Resp 18   SpO2 100%  Gen:   Awake, no distress   Resp:  Normal effort  MSK:   Moves extremities without difficulty  Other:    Medical Decision Making  Medically screening exam initiated at 5:00 PM.  Appropriate orders placed.  Roberta Roberta was informed that the remainder of the evaluation will be completed by another provider, this initial triage assessment does not replace that evaluation, and the importance of remaining in the ED until their evaluation is complete.     Glyn Ade, MD 01/31/23 1701

## 2023-02-01 ENCOUNTER — Emergency Department (HOSPITAL_COMMUNITY): Payer: Medicaid Other

## 2023-02-01 ENCOUNTER — Encounter (HOSPITAL_COMMUNITY): Payer: Self-pay | Admitting: Family Medicine

## 2023-02-01 ENCOUNTER — Observation Stay (HOSPITAL_BASED_OUTPATIENT_CLINIC_OR_DEPARTMENT_OTHER): Payer: Medicaid Other

## 2023-02-01 DIAGNOSIS — I10 Essential (primary) hypertension: Secondary | ICD-10-CM

## 2023-02-01 DIAGNOSIS — E119 Type 2 diabetes mellitus without complications: Secondary | ICD-10-CM | POA: Diagnosis not present

## 2023-02-01 DIAGNOSIS — I6389 Other cerebral infarction: Secondary | ICD-10-CM | POA: Diagnosis not present

## 2023-02-01 DIAGNOSIS — I639 Cerebral infarction, unspecified: Secondary | ICD-10-CM

## 2023-02-01 HISTORY — DX: Type 2 diabetes mellitus without complications: E11.9

## 2023-02-01 LAB — COMPREHENSIVE METABOLIC PANEL
ALT: 13 U/L (ref 0–44)
AST: 19 U/L (ref 15–41)
Albumin: 3.6 g/dL (ref 3.5–5.0)
Alkaline Phosphatase: 87 U/L (ref 38–126)
Anion gap: 12 (ref 5–15)
BUN: 10 mg/dL (ref 6–20)
CO2: 22 mmol/L (ref 22–32)
Calcium: 9.1 mg/dL (ref 8.9–10.3)
Chloride: 101 mmol/L (ref 98–111)
Creatinine, Ser: 0.68 mg/dL (ref 0.44–1.00)
GFR, Estimated: >60 mL/min (ref >60)
Glucose, Bld: 109 mg/dL — ABNORMAL HIGH (ref 70–99)
Potassium: 3.4 mmol/L — ABNORMAL LOW (ref 3.5–5.1)
Sodium: 135 mmol/L (ref 135–145)
Total Bilirubin: 0.6 mg/dL (ref 0.3–1.2)
Total Protein: 6.7 g/dL (ref 6.5–8.1)

## 2023-02-01 LAB — CBC
HCT: 41.3 % (ref 36.0–46.0)
Hemoglobin: 12.7 g/dL (ref 12.0–15.0)
MCH: 23.7 pg — ABNORMAL LOW (ref 26.0–34.0)
MCHC: 30.8 g/dL (ref 30.0–36.0)
MCV: 77.1 fL — ABNORMAL LOW (ref 80.0–100.0)
Platelets: 326 10*3/uL (ref 150–400)
RBC: 5.36 MIL/uL — ABNORMAL HIGH (ref 3.87–5.11)
RDW: 14 % (ref 11.5–15.5)
WBC: 5.8 10*3/uL (ref 4.0–10.5)
nRBC: 0 % (ref 0.0–0.2)

## 2023-02-01 LAB — ECHOCARDIOGRAM COMPLETE
Area-P 1/2: 3.53 cm2
Height: 63 in
S' Lateral: 1.9 cm
Weight: 2356.28 oz

## 2023-02-01 LAB — PROTIME-INR
INR: 1 (ref 0.8–1.2)
Prothrombin Time: 13.2 seconds (ref 11.4–15.2)

## 2023-02-01 LAB — HEMOGLOBIN A1C
Hgb A1c MFr Bld: 6.5 % — ABNORMAL HIGH (ref 4.8–5.6)
Mean Plasma Glucose: 139.85 mg/dL

## 2023-02-01 LAB — CBG MONITORING, ED
Glucose-Capillary: 108 mg/dL — ABNORMAL HIGH (ref 70–99)
Glucose-Capillary: 88 mg/dL (ref 70–99)

## 2023-02-01 LAB — GLUCOSE, CAPILLARY: Glucose-Capillary: 113 mg/dL — ABNORMAL HIGH (ref 70–99)

## 2023-02-01 LAB — LIPID PANEL
Cholesterol: 220 mg/dL — ABNORMAL HIGH (ref 0–200)
HDL: 49 mg/dL (ref >40)
LDL Cholesterol: 153 mg/dL — ABNORMAL HIGH (ref 0–99)
Total CHOL/HDL Ratio: 4.5 RATIO
Triglycerides: 90 mg/dL (ref <150)
VLDL: 18 mg/dL (ref 0–40)

## 2023-02-01 MED ORDER — SODIUM CHLORIDE 0.9 % IV SOLN: INTRAVENOUS | Status: AC

## 2023-02-01 MED ORDER — ASPIRIN 81 MG PO TBEC: 81.0000 mg | DELAYED_RELEASE_TABLET | Freq: Every day | ORAL | Status: DC

## 2023-02-01 MED ORDER — NICOTINE 21 MG/24HR TD PT24: 21.0000 mg | MEDICATED_PATCH | Freq: Every day | TRANSDERMAL | Status: DC

## 2023-02-01 MED ORDER — CLOPIDOGREL BISULFATE 75 MG PO TABS
75.0000 mg | ORAL_TABLET | Freq: Every day | ORAL | Status: DC
  Administered 2023-02-01 – 2023-02-04 (×4): 75 mg via ORAL
  Filled 2023-02-01 (×4): qty 1

## 2023-02-01 MED ORDER — TRAZODONE HCL 50 MG PO TABS
25.0000 mg | ORAL_TABLET | Freq: Every day | ORAL | Status: DC
  Administered 2023-02-01 – 2023-02-03 (×3): 50 mg via ORAL
  Filled 2023-02-01 (×3): qty 1

## 2023-02-01 MED ORDER — LOSARTAN POTASSIUM 50 MG PO TABS
100.0000 mg | ORAL_TABLET | Freq: Every day | ORAL | Status: DC
  Administered 2023-02-01 – 2023-02-02 (×2): 100 mg via ORAL
  Filled 2023-02-01 (×3): qty 2

## 2023-02-01 MED ORDER — SENNOSIDES-DOCUSATE SODIUM 8.6-50 MG PO TABS: 1.0000 | ORAL_TABLET | Freq: Every evening | ORAL | Status: DC | PRN

## 2023-02-01 MED ORDER — ATORVASTATIN CALCIUM 40 MG PO TABS
40.0000 mg | ORAL_TABLET | Freq: Every day | ORAL | Status: DC
  Administered 2023-02-01: 40 mg via ORAL
  Filled 2023-02-01: qty 1

## 2023-02-01 MED ORDER — ATORVASTATIN CALCIUM 80 MG PO TABS
80.0000 mg | ORAL_TABLET | Freq: Every day | ORAL | Status: DC
  Administered 2023-02-02 – 2023-02-04 (×3): 80 mg via ORAL
  Filled 2023-02-01 (×3): qty 1

## 2023-02-01 MED ORDER — LOSARTAN POTASSIUM 50 MG PO TABS: 50.0000 mg | ORAL_TABLET | Freq: Every day | ORAL | Status: DC

## 2023-02-01 MED ORDER — HYDROCHLOROTHIAZIDE 25 MG PO TABS
25.0000 mg | ORAL_TABLET | Freq: Every day | ORAL | Status: DC
  Administered 2023-02-01 – 2023-02-02 (×2): 25 mg via ORAL
  Filled 2023-02-01 (×3): qty 1

## 2023-02-01 MED ORDER — NICOTINE 7 MG/24HR TD PT24
7.0000 mg | MEDICATED_PATCH | Freq: Every day | TRANSDERMAL | Status: DC
  Administered 2023-02-01 – 2023-02-04 (×4): 7 mg via TRANSDERMAL
  Filled 2023-02-01 (×4): qty 1

## 2023-02-01 MED ORDER — LORAZEPAM 2 MG/ML IJ SOLN
0.5000 mg | Freq: Once | INTRAMUSCULAR | Status: AC
  Administered 2023-02-01: 0.5 mg via INTRAVENOUS
  Filled 2023-02-01: qty 1

## 2023-02-01 MED ORDER — STROKE: EARLY STAGES OF RECOVERY BOOK
Freq: Once | Status: AC
  Filled 2023-02-01: qty 1

## 2023-02-01 MED ORDER — IOHEXOL 350 MG/ML SOLN
75.0000 mL | Freq: Once | INTRAVENOUS | Status: AC | PRN
  Administered 2023-02-01: 75 mL via INTRAVENOUS

## 2023-02-01 MED ORDER — ENOXAPARIN SODIUM 40 MG/0.4ML IJ SOSY
40.0000 mg | PREFILLED_SYRINGE | INTRAMUSCULAR | Status: DC
  Administered 2023-02-01 – 2023-02-04 (×4): 40 mg via SUBCUTANEOUS
  Filled 2023-02-01 (×4): qty 0.4

## 2023-02-01 MED ORDER — INSULIN ASPART 100 UNIT/ML IJ SOLN: 0.0000 [IU] | Freq: Three times a day (TID) | INTRAMUSCULAR | Status: DC

## 2023-02-01 MED ORDER — ACETAMINOPHEN 160 MG/5ML PO SOLN: 650.0000 mg | ORAL | Status: DC | PRN

## 2023-02-01 MED ORDER — POTASSIUM CHLORIDE CRYS ER 20 MEQ PO TBCR
40.0000 meq | EXTENDED_RELEASE_TABLET | Freq: Once | ORAL | Status: AC
  Administered 2023-02-01: 40 meq via ORAL
  Filled 2023-02-01: qty 2

## 2023-02-01 MED ORDER — ACETAMINOPHEN 650 MG RE SUPP: 650.0000 mg | RECTAL | Status: DC | PRN

## 2023-02-01 MED ORDER — ACETAMINOPHEN 325 MG PO TABS: 650.0000 mg | ORAL_TABLET | ORAL | Status: DC | PRN

## 2023-02-01 MED ORDER — INSULIN ASPART 100 UNIT/ML IJ SOLN: 0.0000 [IU] | Freq: Every day | INTRAMUSCULAR | Status: DC

## 2023-02-01 MED ORDER — HYDRALAZINE HCL 20 MG/ML IJ SOLN: 10.0000 mg | Freq: Four times a day (QID) | INTRAMUSCULAR | Status: DC | PRN

## 2023-02-01 NOTE — Progress Notes (Addendum)
STROKE TEAM PROGRESS NOTE   INTERVAL HISTORY Patient is seen in her room with no family at the bedside.  Yesterday, she woke up with left-sided weakness as well as tingling and numbness.  Her symptoms did not improve, so she came to the ED early this morning.  She reports that she has been smoking cigarettes but denies other drug use.  MRI shows acute infarct in right centrum semiovale. MRI scan shows small right corona radiator infarct and old lacunar infarcts.  CT angiogram shows no large vessel stenosis or occlusion. Vitals:   02/01/23 1100 02/01/23 1130 02/01/23 1136 02/01/23 1240  BP: 128/79 (!) 146/80    Pulse: 60     Resp: 14 14    Temp:   98.9 F (37.2 C)   TempSrc:   Oral   SpO2: 96%     Weight:    66.8 kg  Height:    5\' 3"  (1.6 m)   CBC:  Recent Labs  Lab 01/31/23 1650 02/01/23 0449  WBC 7.1 5.8  NEUTROABS 3.1  --   HGB 13.4 12.7  HCT 43.0 41.3  MCV 78.2* 77.1*  PLT 339 326   Basic Metabolic Panel:  Recent Labs  Lab 01/31/23 1650 02/01/23 0449  NA 137 135  K 3.7 3.4*  CL 98 101  CO2 22 22  GLUCOSE 122* 109*  BUN 11 10  CREATININE 0.72 0.68  CALCIUM 9.8 9.1   Lipid Panel:  Recent Labs  Lab 02/01/23 0449  CHOL 220*  TRIG 90  HDL 49  CHOLHDL 4.5  VLDL 18  LDLCALC 161*   HgbA1c:  Recent Labs  Lab 02/01/23 0500  HGBA1C 6.5*   Urine Drug Screen: No results for input(s): "LABOPIA", "COCAINSCRNUR", "LABBENZ", "AMPHETMU", "THCU", "LABBARB" in the last 168 hours.  Alcohol Level No results for input(s): "ETH" in the last 168 hours.  IMAGING past 24 hours CT ANGIO HEAD NECK W WO CM  Result Date: 02/01/2023 CLINICAL DATA:  Left-sided weakness and slurred speech EXAM: CT ANGIOGRAPHY HEAD AND NECK WITH AND WITHOUT CONTRAST TECHNIQUE: Multidetector CT imaging of the head and neck was performed using the standard protocol during bolus administration of intravenous contrast. Multiplanar CT image reconstructions and MIPs were obtained to evaluate the vascular  anatomy. Carotid stenosis measurements (when applicable) are obtained utilizing NASCET criteria, using the distal internal carotid diameter as the denominator. RADIATION DOSE REDUCTION: This exam was performed according to the departmental dose-optimization program which includes automated exposure control, adjustment of the mA and/or kV according to patient size and/or use of iterative reconstruction technique. CONTRAST:  75mL OMNIPAQUE IOHEXOL 350 MG/ML SOLN COMPARISON:  Brain MRI from earlier today FINDINGS: CTA NECK FINDINGS Aortic arch: Normal with 3 vessel branching Right carotid system: Mixed density plaque at the bulb. No significant stenosis and no ulceration. Left carotid system: Extensively plaque at the bifurcation. No significant stenosis and no ulceration. Vertebral arteries: No proximal subclavian or vertebral stenosis. Skeleton: Advanced cervical spine degeneration Other neck: Negative Upper chest: No acute finding Review of the MIP images confirms the above findings CTA HEAD FINDINGS Anterior circulation: Atheromatous calcification of the carotid siphons. No branch occlusion, beading, or aneurysm. No flow limiting stenosis. Posterior circulation: No branch occlusion, beading, or aneurysm. No flow limiting stenosis. Venous sinuses: Negative for the arterial phase Anatomic variants: None significant Review of the MIP images confirms the above findings IMPRESSION: No emergent vascular finding. Atherosclerosis in the head and neck without flow limiting stenosis or ulceration of major vessels.  Electronically Signed   By: Tiburcio Pea M.D.   On: 02/01/2023 04:13   MR BRAIN WO CONTRAST  Result Date: 02/01/2023 CLINICAL DATA:  Initial evaluation for neuro deficit, stroke. EXAM: MRI HEAD WITHOUT CONTRAST TECHNIQUE: Multiplanar, multiecho pulse sequences of the brain and surrounding structures were obtained without intravenous contrast. COMPARISON:  Prior CT from 01/31/2023. FINDINGS: Brain: Cerebral  volume within normal limits. Patchy T2/FLAIR hyperintensity involving the periventricular deep white matter both cerebral hemispheres as well as the pons, most consistent with chronic small vessel ischemic disease, moderate to advanced for age. Multiple scattered superimposed remote lacunar infarcts present about the bilateral basal ganglia, thalami, pons, and cerebellum. Multiple scattered chronic micro hemorrhages seen about the cerebellum, brainstem, and left greater than right deep gray nuclei, most characteristic of chronic poorly controlled hypertension. 9 mm acute ischemic nonhemorrhagic infarct present at the posterior right frontal centrum semi ovale (series 5, image 90). No associated mass effect. No other evidence for acute or subacute ischemia. No acute intracranial hemorrhage. No mass lesion, midline shift or mass effect. No hydrocephalus or extra-axial fluid collection. Pituitary gland suprasellar region within normal limits. Vascular: Major intracranial vascular flow voids are maintained. Skull and upper cervical spine: Craniocervical junction within normal limits. Bone marrow signal intensity normal. No scalp soft tissue abnormality. Sinuses/Orbits: Globes and orbital soft tissues within normal limits. Paranasal sinuses are largely clear. No significant mastoid effusion. Other: None. IMPRESSION: 1. 9 mm acute ischemic nonhemorrhagic infarct involving the posterior right frontal centrum semi ovale. 2. Underlying moderate to advanced chronic microvascular ischemic disease with multiple remote lacunar infarcts involving the bilateral basal ganglia, thalami, pons, and cerebellum. 3. Multiple scattered chronic micro hemorrhages as above, she most characteristic of chronic poorly controlled hypertension. Electronically Signed   By: Rise Mu M.D.   On: 02/01/2023 03:06   CT Head Wo Contrast  Result Date: 01/31/2023 CLINICAL DATA:  Left-sided weakness. EXAM: CT HEAD WITHOUT CONTRAST TECHNIQUE:  Contiguous axial images were obtained from the base of the skull through the vertex without intravenous contrast. RADIATION DOSE REDUCTION: This exam was performed according to the departmental dose-optimization program which includes automated exposure control, adjustment of the mA and/or kV according to patient size and/or use of iterative reconstruction technique. COMPARISON:  April 10, 2022 FINDINGS: Brain: There is mild cerebral atrophy with widening of the extra-axial spaces and ventricular dilatation. There are areas of decreased attenuation within the white matter tracts of the supratentorial brain, consistent with microvascular disease changes. This is greater than expected for the patient's age. Small, chronic bilateral basal ganglia lacunar infarcts are seen. Vascular: No hyperdense vessel or unexpected calcification. Skull: Normal. Negative for fracture or focal lesion. Sinuses/Orbits: No acute finding. Other: None. IMPRESSION: 1. Mild cerebral atrophy and microvascular disease changes of the supratentorial brain, greater than expected for the patient's age. 2. Small, chronic bilateral basal ganglia lacunar infarcts. 3. No acute intracranial abnormality. Electronically Signed   By: Aram Candela M.D.   On: 01/31/2023 19:07   DG Chest Portable 1 View  Result Date: 01/31/2023 CLINICAL DATA:  Chest pain EXAM: PORTABLE CHEST 1 VIEW COMPARISON:  X-ray 02/04/2022 FINDINGS: Slight linear opacity lung bases, right-greater-than-left. No consolidation, pneumothorax or edema. Normal cardiopericardial silhouette. Calcified aorta. Artifact from the patient's clothing. IMPRESSION: Mild basilar atelectasis, right-greater-than-left. Electronically Signed   By: Karen Kays M.D.   On: 01/31/2023 17:55    PHYSICAL EXAM General: Alert, well-developed well-nourished middle-aged African-American lady with in no acute distress Respiratory: Regular, unlabored respirations on  room air  NEURO:  Mental Status:  AA&Ox3  Speech/Language: speech is without dysarthria or aphasia.    Cranial Nerves:  II: PERRL. Visual fields full.  III, IV, VI: EOMI. Eyelids elevate symmetrically.  V: Sensation is intact to light touch and symmetrical to face.  VII: Smile is symmetrical.  VIII: hearing intact to voice. IX, X:  Phonation is normal.  XII: tongue is midline without fasciculations. Motor: 5/5 strength to right upper extremity and right lower extremity with weakness of the left hand grip and drift of the left leg noted, diminished fine motor movement on the left hand and right hand orbiting left hand. Tone: is normal and bulk is normal Sensation- Intact to light touch bilaterally but diminished on the left Coordination: FTN intact bilaterally, drift in the left leg Gait- deferred   ASSESSMENT/PLAN Roberta Calderon is a 52 y.o. female with history of hypertension, diabetes, multiple strokes and cocaine abuse presenting with left-sided weakness as well as tingling and numbness.  Her symptoms did not improve, so she came to the ED early this morning.  She reports that she has been smoking cigarettes but denies other drug use.  MRI shows acute infarct in right centrum semiovale.  Stroke:  right centrum semiovale stroke Etiology: Small vessel disease in the setting of poorly controlled risk factors CT head No acute abnormality. Small vessel disease. Atrophy.  Small, chronic bilateral basal ganglia infarcts CTA head & neck no LVO or hemodynamically significant stenosis MRI acute ischemic infarct in posterior right frontal centrum semiovale, moderate to advanced chronic microvascular ischemic disease with multiple remote lacunar infarcts, multiple scattered chronic microhemorrhages 2D Echo pending LDL 153 HgbA1c 6.5 VTE prophylaxis -Lovenox    Diet   Diet regular Fluid consistency: Thin   No antithrombotic prior to admission, now on clopidogrel 75 mg daily.  Aspirin not given due to allergy Therapy  recommendations: Pending Disposition: Pending  Hypertension Home meds: Losartan 50 mg daily, hydrochlorothiazide 25 mg daily Stable Permissive hypertension (OK if < 220/120) but gradually normalize in 5-7 days Long-term BP goal normotensive  Hyperlipidemia Home meds: Atorvastatin 40 mg daily, increase to 80 mg LDL 153, goal < 70  Continue statin at discharge  Diabetes type II Controlled Home meds: Metformin 500 mg daily HgbA1c 6.5, goal < 7.0 CBGs Recent Labs    01/31/23 1651 02/01/23 0734 02/01/23 1159  GLUCAP 129* 108* 88    SSI  Other Stroke Risk Factors Cigarette smoker advised to stop smoking and nicotine patch given Hx stroke  Other Active Problems None  Hospital day # 0  Cortney E Ernestina Columbia , MSN, AGACNP-BC [facility] See Amion for schedule and pager information 02/01/2023 12:59 PM   STROKE MD NOTE :  I have personally obtained history,examined this patient, reviewed notes, independently viewed imaging studies, participated in medical decision making and plan of care.ROS completed by me personally and pertinent positives fully documented  I have made any additions or clarifications directly to the above note. Agree with note above.  Patient presented with left-sided paresthesias and weakness secondary to small right subcortical lacunar infarct from small vessel disease.  Recommend Plavix alone for stroke prevention given history of aspirin allergy.  And aggressive risk factor modification.  Increase home dose of statin.  Check urine drug screen and echocardiogram.  Patient counseled to quit smoking and is agreeable.  Recommend nicotine patch.  Long discussion with patient about her lacunar stroke and discussion about stroke evaluation prevention treatment and answering  questions.  Discussed with Dr.Pahvani.  Greater than 50% time during this 50-minute visit was spent in counseling and coordination of care about her lacunar stroke and discussion about  stroke prevention and answering questions.  Delia Heady, MD Medical Director [facility] Pager: [phone] 02/01/2023 3:10 PM   To contact Stroke Continuity provider, please refer to [email or web address]. After hours, contact General Neurology

## 2023-02-01 NOTE — Progress Notes (Addendum)
This is a 52 year old lady with past medical history of hypertension, type 2 diabetes mellitus and multiple strokes as well as history of substance abuse and currently nicotine dependence came in with left-sided weakness and speech difficulty and admitted after midnight for new acute ischemic stroke.  Seen by neurology.  She has been started on Plavix (she is not on aspirin).  She is also on atorvastatin.  She has hyperlipidemia.  CTA head and neck negative for large vessel obstruction but she does have atherosclerosis in the head and neck without flow-limiting stenosis.  Patient seen and examined in the ED.  She claims that she is no better than yesterday however on my examination, she appears to have 4/5 power in both upper and lower left extremities which is better, based on the documentation in neurology's note.  I will defer to neurology for aspirin since she is not on it yet.  She is yet to be seen by PT OT.  I wonder if she is going to qualify for CIR.  PTA, she was on metformin.  Her blood pressure is improving.  She is out of the window for permissive hypertension.  Continue hydrochlorothiazide, I increased her losartan to 100 mg p.o. daily.  Hemoglobin A1c only 6.5.  Continue SSI.  She has mild hypokalemia which I have replaced.

## 2023-02-01 NOTE — ED Notes (Signed)
ED TO INPATIENT HANDOFF REPORT  ED Nurse Name and Phone #: Yunique Dearcos RN BSN [phone]  S Name/Age/Gender Roberta Calderon 52 y.o. female Room/Bed: 027C/027C  Code Status   Code Status: Full Code  Home/SNF/Other Home Patient oriented to: self, place, time, and situation Is this baseline? Yes   Triage Complete: Triage complete  Chief Complaint Acute CVA (cerebrovascular accident) [facility]) [I63.9]  Triage Note Pt bib ems with L sided weakness and slurred speech onset yesterday at 0700. Pt with hx of CVA X4. States she waited to see if the symptoms would get better. VSS with EMS.    Allergies Allergies  Allergen Reactions   Nsaids Nausea And Vomiting and Rash   Aspirin Hives    Level of Care/Admitting Diagnosis ED Disposition     ED Disposition  Admit   Condition  --   Comment  Hospital Area: [facility] [[id number]]  Level of Care: Telemetry Medical [104]  May place patient in observation at [facility] or [facility] if equivalent level of care is available:: No  Covid Evaluation: Asymptomatic - no recent exposure (last 10 days) testing not required  Diagnosis: Acute CVA (cerebrovascular accident) 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 No pertinent surgical history.   A IV Location/Drains/Wounds Patient Lines/Drains/Airways Status     Active Line/Drains/Airways     Name Placement date Placement time Site Days   Peripheral IV 01/31/23 20 G Left Antecubital 01/31/23  --  Antecubital  1            Intake/Output Last 24 hours No intake or output data in the 24 hours ending 02/01/23 1221  Labs/Imaging Results for  orders placed or performed during the hospital encounter of 01/31/23 (from the past 48 hour(s))  CBC with Differential     Status: Abnormal   Collection Time: 01/31/23  4:50 PM  Result Value Ref Range   WBC 7.1 4.0 - 10.5 K/uL   RBC 5.50 (H) 3.87 - 5.11 MIL/uL   Hemoglobin 13.4 12.0 - 15.0 g/dL   HCT 60.1 09.3 - 23.5 %   MCV 78.2 (L) 80.0 - 100.0 fL   MCH 24.4 (L) 26.0 - 34.0 pg   MCHC 31.2 30.0 - 36.0 g/dL   RDW 57.3 22.0 - 25.4 %   Platelets 339 150 - 400 K/uL   nRBC 0.0 0.0 - 0.2 %   Neutrophils Relative % 44 %   Neutro Abs 3.1 1.7 - 7.7 K/uL   Lymphocytes Relative 47 %   Lymphs Abs 3.3 0.7 - 4.0 K/uL   Monocytes Relative 6 %   Monocytes Absolute 0.4 0.1 - 1.0 K/uL   Eosinophils Relative 3 %   Eosinophils Absolute 0.2 0.0 - 0.5 K/uL   Basophils Relative 0 %   Basophils Absolute 0.0 0.0 - 0.1 K/uL   Immature Granulocytes 0 %   Abs Immature Granulocytes 0.01 0.00 - 0.07 K/uL    Comment: Performed at [facility], [address], [place], [place] [address]  Basic metabolic panel     Status: Abnormal   Collection Time: 01/31/23  4:50 PM  Result Value Ref Range  Sodium 137 135 - 145 mmol/L   Potassium 3.7 3.5 - 5.1 mmol/L   Chloride 98 98 - 111 mmol/L   CO2 22 22 - 32 mmol/L   Glucose, Bld 122 (H) 70 - 99 mg/dL    Comment: Glucose reference range applies only to samples taken after fasting for at least 8 hours.   BUN 11 6 - 20 mg/dL   Creatinine, Ser 1.61 0.44 - 1.00 mg/dL   Calcium 9.8 8.9 - 09.6 mg/dL   GFR, Estimated >04 >54 mL/min    Comment: (NOTE) Calculated using the CKD-EPI Creatinine Equation (2021)    Anion gap 17 (H) 5 - 15    Comment: Performed at [facility], [address] [place], [place] [address]  Troponin I (High Sensitivity)     Status: None   Collection Time: 01/31/23  4:50 PM  Result Value Ref Range   Troponin I (High Sensitivity) 8 <18 ng/L    Comment: (NOTE) Elevated high sensitivity troponin I (hsTnI) values and significant   changes across serial measurements may suggest ACS but many other  chronic and acute conditions are known to elevate hsTnI results.  Refer to the "Links" section for chest pain algorithms and additional  guidance. Performed at [facility], [address], [place], [place] [address]   CBG monitoring, ED     Status: Abnormal   Collection Time: 01/31/23  4:51 PM  Result Value Ref Range   Glucose-Capillary 129 (H) 70 - 99 mg/dL    Comment: Glucose reference range applies only to samples taken after fasting for at least 8 hours.  Protime-INR     Status: None   Collection Time: 02/01/23  1:47 AM  Result Value Ref Range   Prothrombin Time 13.2 11.4 - 15.2 seconds   INR 1.0 0.8 - 1.2    Comment: (NOTE) INR goal varies based on device and disease states. Performed at [facility], [address] [place], [place] [address]   CBC     Status: Abnormal   Collection Time: 02/01/23  4:49 AM  Result Value Ref Range   WBC 5.8 4.0 - 10.5 K/uL   RBC 5.36 (H) 3.87 - 5.11 MIL/uL   Hemoglobin 12.7 12.0 - 15.0 g/dL   HCT 13.0 86.5 - 78.4 %   MCV 77.1 (L) 80.0 - 100.0 fL   MCH 23.7 (L) 26.0 - 34.0 pg   MCHC 30.8 30.0 - 36.0 g/dL   RDW 69.6 29.5 - 28.4 %   Platelets 326 150 - 400 K/uL   nRBC 0.0 0.0 - 0.2 %    Comment: Performed at [facility], [address], [place], [place] [address]  Comprehensive metabolic panel     Status: Abnormal   Collection Time: 02/01/23  4:49 AM  Result Value Ref Range   Sodium 135 135 - 145 mmol/L   Potassium 3.4 (L) 3.5 - 5.1 mmol/L   Chloride 101 98 - 111 mmol/L   CO2 22 22 - 32 mmol/L   Glucose, Bld 109 (H) 70 - 99 mg/dL    Comment: Glucose reference range applies only to samples taken after fasting for at least 8 hours.   BUN 10 6 - 20 mg/dL   Creatinine, Ser 0.10 0.44 - 1.00 mg/dL   Calcium 9.1 8.9 - 27.2 mg/dL   Total Protein 6.7 6.5 - 8.1 g/dL   Albumin 3.6 3.5 - 5.0 g/dL   AST 19 15 - 41 U/L  ALT 13 0 - 44 U/L   Alkaline  Phosphatase 87 38 - 126 U/L   Total Bilirubin 0.6 0.3 - 1.2 mg/dL   GFR, Estimated >16 >10 mL/min    Comment: (NOTE) Calculated using the CKD-EPI Creatinine Equation (2021)    Anion gap 12 5 - 15    Comment: Performed at [facility], [address] [place], [place] [address]  Lipid panel     Status: Abnormal   Collection Time: 02/01/23  4:49 AM  Result Value Ref Range   Cholesterol 220 (H) 0 - 200 mg/dL   Triglycerides 90 <409 mg/dL   HDL 49 >81 mg/dL   Total CHOL/HDL Ratio 4.5 RATIO   VLDL 18 0 - 40 mg/dL   LDL Cholesterol 191 (H) 0 - 99 mg/dL    Comment:        Total Cholesterol/HDL:CHD Risk Coronary Heart Disease Risk Table                     Men   Women  1/2 Average Risk   3.4   3.3  Average Risk       5.0   4.4  2 X Average Risk   9.6   7.1  3 X Average Risk  23.4   11.0        Use the calculated Patient Ratio above and the CHD Risk Table to determine the patient's CHD Risk.        ATP III CLASSIFICATION (LDL):  <100     mg/dL   Optimal  478-295  mg/dL   Near or Above                    Optimal  130-159  mg/dL   Borderline  621-308  mg/dL   High  >657     mg/dL   Very High Performed at [facility], [address], [place], [place] [address]   Hemoglobin A1c     Status: Abnormal   Collection Time: 02/01/23  5:00 AM  Result Value Ref Range   Hgb A1c MFr Bld 6.5 (H) 4.8 - 5.6 %    Comment: (NOTE) Pre diabetes:          5.7%-6.4%  Diabetes:              >6.4%  Glycemic control for   <7.0% adults with diabetes    Mean Plasma Glucose 139.85 mg/dL    Comment: Performed at [facility], [address], [place], [place] [address]  CBG monitoring, ED     Status: Abnormal   Collection Time: 02/01/23  7:34 AM  Result Value Ref Range   Glucose-Capillary 108 (H) 70 - 99 mg/dL    Comment: Glucose reference range applies only to samples taken after fasting for at least 8 hours.  CBG monitoring, ED     Status: None   Collection Time: 02/01/23 11:59  AM  Result Value Ref Range   Glucose-Capillary 88 70 - 99 mg/dL    Comment: Glucose reference range applies only to samples taken after fasting for at least 8 hours.   CT ANGIO HEAD NECK W WO CM  Result Date: 02/01/2023 CLINICAL DATA:  Left-sided weakness and slurred speech EXAM: CT ANGIOGRAPHY HEAD AND NECK WITH AND WITHOUT CONTRAST TECHNIQUE: Multidetector CT imaging of the head and neck was performed using the standard protocol during bolus administration of intravenous contrast. Multiplanar CT image reconstructions and MIPs were  obtained to evaluate the vascular anatomy. Carotid stenosis measurements (when applicable) are obtained utilizing NASCET criteria, using the distal internal carotid diameter as the denominator. RADIATION DOSE REDUCTION: This exam was performed according to the departmental dose-optimization program which includes automated exposure control, adjustment of the mA and/or kV according to patient size and/or use of iterative reconstruction technique. CONTRAST:  75mL OMNIPAQUE IOHEXOL 350 MG/ML SOLN COMPARISON:  Brain MRI from earlier today FINDINGS: CTA NECK FINDINGS Aortic arch: Normal with 3 vessel branching Right carotid system: Mixed density plaque at the bulb. No significant stenosis and no ulceration. Left carotid system: Extensively plaque at the bifurcation. No significant stenosis and no ulceration. Vertebral arteries: No proximal subclavian or vertebral stenosis. Skeleton: Advanced cervical spine degeneration Other neck: Negative Upper chest: No acute finding Review of the MIP images confirms the above findings CTA HEAD FINDINGS Anterior circulation: Atheromatous calcification of the carotid siphons. No branch occlusion, beading, or aneurysm. No flow limiting stenosis. Posterior circulation: No branch occlusion, beading, or aneurysm. No flow limiting stenosis. Venous sinuses: Negative for the arterial phase Anatomic variants: None significant Review of the MIP images confirms  the above findings IMPRESSION: No emergent vascular finding. Atherosclerosis in the head and neck without flow limiting stenosis or ulceration of major vessels. Electronically Signed   By: Tiburcio Pea M.D.   On: 02/01/2023 04:13   MR BRAIN WO CONTRAST  Result Date: 02/01/2023 CLINICAL DATA:  Initial evaluation for neuro deficit, stroke. EXAM: MRI HEAD WITHOUT CONTRAST TECHNIQUE: Multiplanar, multiecho pulse sequences of the brain and surrounding structures were obtained without intravenous contrast. COMPARISON:  Prior CT from 01/31/2023. FINDINGS: Brain: Cerebral volume within normal limits. Patchy T2/FLAIR hyperintensity involving the periventricular deep white matter both cerebral hemispheres as well as the pons, most consistent with chronic small vessel ischemic disease, moderate to advanced for age. Multiple scattered superimposed remote lacunar infarcts present about the bilateral basal ganglia, thalami, pons, and cerebellum. Multiple scattered chronic micro hemorrhages seen about the cerebellum, brainstem, and left greater than right deep gray nuclei, most characteristic of chronic poorly controlled hypertension. 9 mm acute ischemic nonhemorrhagic infarct present at the posterior right frontal centrum semi ovale (series 5, image 90). No associated mass effect. No other evidence for acute or subacute ischemia. No acute intracranial hemorrhage. No mass lesion, midline shift or mass effect. No hydrocephalus or extra-axial fluid collection. Pituitary gland suprasellar region within normal limits. Vascular: Major intracranial vascular flow voids are maintained. Skull and upper cervical spine: Craniocervical junction within normal limits. Bone marrow signal intensity normal. No scalp soft tissue abnormality. Sinuses/Orbits: Globes and orbital soft tissues within normal limits. Paranasal sinuses are largely clear. No significant mastoid effusion. Other: None. IMPRESSION: 1. 9 mm acute ischemic nonhemorrhagic  infarct involving the posterior right frontal centrum semi ovale. 2. Underlying moderate to advanced chronic microvascular ischemic disease with multiple remote lacunar infarcts involving the bilateral basal ganglia, thalami, pons, and cerebellum. 3. Multiple scattered chronic micro hemorrhages as above, she most characteristic of chronic poorly controlled hypertension. Electronically Signed   By: Rise Mu M.D.   On: 02/01/2023 03:06   CT Head Wo Contrast  Result Date: 01/31/2023 CLINICAL DATA:  Left-sided weakness. EXAM: CT HEAD WITHOUT CONTRAST TECHNIQUE: Contiguous axial images were obtained from the base of the skull through the vertex without intravenous contrast. RADIATION DOSE REDUCTION: This exam was performed according to the departmental dose-optimization program which includes automated exposure control, adjustment of the mA and/or kV according to patient size and/or use of iterative  reconstruction technique. COMPARISON:  April 10, 2022 FINDINGS: Brain: There is mild cerebral atrophy with widening of the extra-axial spaces and ventricular dilatation. There are areas of decreased attenuation within the white matter tracts of the supratentorial brain, consistent with microvascular disease changes. This is greater than expected for the patient's age. Small, chronic bilateral basal ganglia lacunar infarcts are seen. Vascular: No hyperdense vessel or unexpected calcification. Skull: Normal. Negative for fracture or focal lesion. Sinuses/Orbits: No acute finding. Other: None. IMPRESSION: 1. Mild cerebral atrophy and microvascular disease changes of the supratentorial brain, greater than expected for the patient's age. 2. Small, chronic bilateral basal ganglia lacunar infarcts. 3. No acute intracranial abnormality. Electronically Signed   By: Aram Candela M.D.   On: 01/31/2023 19:07   DG Chest Portable 1 View  Result Date: 01/31/2023 CLINICAL DATA:  Chest pain EXAM: PORTABLE CHEST 1 VIEW  COMPARISON:  X-ray 02/04/2022 FINDINGS: Slight linear opacity lung bases, right-greater-than-left. No consolidation, pneumothorax or edema. Normal cardiopericardial silhouette. Calcified aorta. Artifact from the patient's clothing. IMPRESSION: Mild basilar atelectasis, right-greater-than-left. Electronically Signed   By: Karen Kays M.D.   On: 01/31/2023 17:55    Pending Labs Unresulted Labs (From admission, onward)     Start     Ordered   02/08/23 0500  Creatinine, serum  (enoxaparin (LOVENOX)    CrCl >/= 30 ml/min)  Weekly,   R     Comments: while on enoxaparin therapy    02/01/23 0434   02/02/23 0500  Basic metabolic panel  Tomorrow morning,   R        02/01/23 1032   02/01/23 0500  CBC  (Labs)  Daily,   R      02/01/23 0434   02/01/23 0500  Comprehensive metabolic panel  (Labs)  Daily,   R      02/01/23 0434   02/01/23 0430  Rapid urine drug screen (hospital performed)  ONCE - STAT,   STAT        02/01/23 0429            Vitals/Pain Today's Vitals   02/01/23 1030 02/01/23 1100 02/01/23 1130 02/01/23 1136  BP: 135/85 128/79 (!) 146/80   Pulse: 70 60    Resp: 12 14 14    Temp:    98.9 F (37.2 C)  TempSrc:    Oral  SpO2: 97% 96%    PainSc:        Isolation Precautions No active isolations  Medications Medications  atorvastatin (LIPITOR) tablet 40 mg (40 mg Oral Given 02/01/23 0933)  traZODone (DESYREL) tablet 25-50 mg (has no administration in time range)   stroke: early stages of recovery book (has no administration in time range)  0.9 %  sodium chloride infusion ( Intravenous New Bag/Given 02/01/23 0448)  acetaminophen (TYLENOL) tablet 650 mg (has no administration in time range)    Or  acetaminophen (TYLENOL) 160 MG/5ML solution 650 mg (has no administration in time range)    Or  acetaminophen (TYLENOL) suppository 650 mg (has no administration in time range)  senna-docusate (Senokot-S) tablet 1 tablet (has no administration in time range)  enoxaparin (LOVENOX)  injection 40 mg (40 mg Subcutaneous Given 02/01/23 0934)  insulin aspart (novoLOG) injection 0-6 Units ( Subcutaneous Not Given 02/01/23 1200)  insulin aspart (novoLOG) injection 0-5 Units (has no administration in time range)  clopidogrel (PLAVIX) tablet 75 mg (75 mg Oral Given 02/01/23 0933)  hydrochlorothiazide (HYDRODIURIL) tablet 25 mg (25 mg Oral Given 02/01/23 0933)  losartan (COZAAR)  tablet 100 mg (100 mg Oral Given 02/01/23 0933)  hydrALAZINE (APRESOLINE) injection 10 mg (has no administration in time range)  LORazepam (ATIVAN) injection 0.5 mg (0.5 mg Intravenous Given 02/01/23 0205)  iohexol (OMNIPAQUE) 350 MG/ML injection 75 mL (75 mLs Intravenous Contrast Given 02/01/23 0357)  potassium chloride SA (KLOR-CON M) CR tablet 40 mEq (40 mEq Oral Given 02/01/23 0933)    Mobility walks with person assist     Focused Assessments    R Recommendations: See Admitting Provider Note  Report given to:   Additional Notes: patient does have some minor mobility issues and requires assistance with ambulation so she does not fall

## 2023-02-01 NOTE — ED Notes (Signed)
Patient returned from CT

## 2023-02-01 NOTE — Evaluation (Signed)
Physical Therapy Evaluation Patient Details Name: Roberta Calderon MRN: [id number] DOB: Feb 16, 1971 Today's Date: 02/01/2023  History of Present Illness  52 y/o F admitted to [facility] on 6/6 for left sided weakness as well as tingling and numbness and speech difficulty. MRI shows acute ischemic infarct in posterior right frontal centrum semiovale, multiple scattered chronic microhemorrhages and multiple remote lacunar infarcts. PMHx: HTN, DM, multiple strokes, substance abuse in remission  Clinical Impression  Pt presents today with impaired functional mobility, limited by L sided weakness, impaired balance, activity tolerance, and cognition. No family present in the room during today's session, but pt reports being independent with mobility at baseline. Today, pt has L sided weakness, requiring assist for all mobility, and moderate cueing for safety with mobility. Pt educated on RW use, but impulsiveness noted, pt intermittently leaving RW behind. Pt will continue to benefit from skilled acute PT to progress mobility and address deficits. Pt lives behind her mom who she reports is available full time to assist if needed, at this time, recommend high intensity PT upon discharge to maximize independence with all mobility. Acute PT will continue to follow as appropriate.      Recommendations for follow up therapy are one component of a multi-disciplinary discharge planning process, led by the attending physician.  Recommendations may be updated based on patient status, additional functional criteria and insurance authorization.  Follow Up Recommendations       Assistance Recommended at Discharge Frequent or constant Supervision/Assistance  Patient can return home with the following  A lot of help with walking and/or transfers;Assist for transportation;Help with stairs or ramp for entrance;Direct supervision/assist for medications management;Direct supervision/assist for financial management;Assistance with  cooking/housework    Equipment Recommendations Rolling walker (2 wheels)  Recommendations for Other Services  Rehab consult    Functional Status Assessment Patient has had a recent decline in their functional status and demonstrates the ability to make significant improvements in function in a reasonable and predictable amount of time.     Precautions / Restrictions Precautions Precautions: Fall Restrictions Weight Bearing Restrictions: No      Mobility  Bed Mobility Overal bed mobility: Needs Assistance Bed Mobility: Supine to Sit, Sit to Supine     Supine to sit: Min assist, HOB elevated Sit to supine: Min guard, HOB elevated   General bed mobility comments: use of stretcher rails, minA for trunk support into sitting with increased time, close guard for safety when returning to supine    Transfers Overall transfer level: Needs assistance Equipment used: Rolling walker (2 wheels) Transfers: Sit to/from Stand Sit to Stand: Min assist           General transfer comment: minG to stand from elevated stretcher, minA to stand from toilet, cueing for proper hand placement, use of grab bar in the bathroom    Ambulation/Gait Ambulation/Gait assistance: Min assist Gait Distance (Feet): 75 Feet Assistive device: Rolling walker (2 wheels) Gait Pattern/deviations: Step-through pattern, Decreased stride length, Decreased dorsiflexion - left, Drifts right/left, Trunk flexed Gait velocity: decreased     General Gait Details: decreased grip with L hand on RW, decreased L foot clearance and mild imbalance. Pt impulsive, leaving the RW when going to the sink to wash her hands, requiring cueing for proper use and management of RW, especially around obstacles. MinA needed for balance and safety, especially without AD  Stairs            Wheelchair Mobility    Modified Rankin (Stroke Patients Only) Modified  Rankin (Stroke Patients Only) Pre-Morbid Rankin Score: Slight  disability Modified Rankin: Moderately severe disability     Balance Overall balance assessment: Needs assistance Sitting-balance support: Bilateral upper extremity supported, Feet supported Sitting balance-Leahy Scale: Fair     Standing balance support: Bilateral upper extremity supported, During functional activity, Reliant on assistive device for balance Standing balance-Leahy Scale: Poor Standing balance comment: reliant on RW, brief ambulation without AD requiring external support                             Pertinent Vitals/Pain Pain Assessment Pain Assessment: Faces Pain Score: 0-No pain    Home Living Family/patient expects to be discharged to:: Private residence Living Arrangements: Alone Available Help at Discharge: Family;Available 24 hours/day Type of Home: House Home Access: Level entry       Home Layout: One level Home Equipment: None Additional Comments: lives in house behind mom's house    Prior Function Prior Level of Function : Independent/Modified Independent             Mobility Comments: independent with ambulation, no AD, no falls ADLs Comments: independent per pt report, doesn't drive     Hand Dominance   Dominant Hand: Right    Extremity/Trunk Assessment   Upper Extremity Assessment Upper Extremity Assessment: Defer to OT evaluation    Lower Extremity Assessment Lower Extremity Assessment: LLE deficits/detail LLE Deficits / Details: 3-/5 hip flexion and knee extension, questionable effort. ~1/ ankle PF/DF LLE Sensation: decreased light touch LLE Coordination: decreased gross motor    Cervical / Trunk Assessment Cervical / Trunk Assessment: Normal  Communication   Communication: No difficulties  Cognition Arousal/Alertness: Lethargic Behavior During Therapy: Flat affect, Impulsive, Agitated Overall Cognitive Status: No family/caregiver present to determine baseline cognitive functioning                                  General Comments: A&Ox4, lethargic upon arrival, but more alert once EOB. Pt impulsive with ambulation and use of RW, becoming agitated with lines and using her teeth to pull her SPO2 monitor off. No family present to determine baseline        General Comments General comments (skin integrity, edema, etc.): VSS on room air, limited monitor of SPO2 as pt removing monitor and refusing to put back on    Exercises     Assessment/Plan    PT Assessment Patient needs continued PT services  PT Problem List Decreased strength;Decreased activity tolerance;Decreased mobility;Decreased balance;Decreased coordination;Decreased cognition;Decreased knowledge of use of DME;Decreased safety awareness;Decreased knowledge of precautions;Impaired sensation       PT Treatment Interventions DME instruction;Gait training;Functional mobility training;Therapeutic activities;Therapeutic exercise;Neuromuscular re-education;Balance training;Cognitive remediation;Patient/family education    PT Goals (Current goals can be found in the Care Plan section)  Acute Rehab PT Goals Patient Stated Goal: get better PT Goal Formulation: With patient Time For Goal Achievement: 02/15/23 Potential to Achieve Goals: Fair    Frequency Min 3X/week     Co-evaluation               AM-PAC PT "6 Clicks" Mobility  Outcome Measure Help needed turning from your back to your side while in a flat bed without using bedrails?: A Little Help needed moving from lying on your back to sitting on the side of a flat bed without using bedrails?: A Little Help needed moving to and from a bed  to a chair (including a wheelchair)?: A Little Help needed standing up from a chair using your arms (e.g., wheelchair or bedside chair)?: A Little Help needed to walk in hospital room?: A Lot Help needed climbing 3-5 steps with a railing? : Total 6 Click Score: 15    End of Session Equipment Utilized During Treatment: Gait  belt Activity Tolerance: Patient limited by lethargy Patient left: in bed;with call bell/phone within reach Nurse Communication: Mobility status PT Visit Diagnosis: Unsteadiness on feet (R26.81);Other abnormalities of gait and mobility (R26.89);Muscle weakness (generalized) (M62.81);Difficulty in walking, not elsewhere classified (R26.2);Hemiplegia and hemiparesis Hemiplegia - Right/Left: Left Hemiplegia - dominant/non-dominant: Non-dominant Hemiplegia - caused by: Cerebral infarction    Time: 1610-9604 PT Time Calculation (min) (ACUTE ONLY): 17 min   Charges:   PT Evaluation $PT Eval Moderate Complexity: 1 Mod          Lindalou Hose, PT DPT Acute Rehabilitation Services Office [phone]   Leonie Man 02/01/2023, 2:27 PM

## 2023-02-01 NOTE — ED Provider Notes (Signed)
[place] EMERGENCY DEPARTMENT AT [facility] Provider Note   CSN: [id number] Arrival date & time: 01/31/23  1648     History  Chief Complaint  Patient presents with   Stroke Symptoms    Roberta Calderon is a 52 y.o. female.  HPI   Patient with medical history including CVA with residual right-sided weakness, diabetes, hypertension, current smoker presenting with complaints of left-sided weakness.  Patient states this started Thursday morning when she woke up at 7 AM, went to bed at 10 PM on Wednesday, without any issues, she states that when she woke was having left-sided weakness who is having difficulty picking things up her left hand and ambulating with her left leg.  She states she is not having headaches or change in vision she denies any recent falls no recent head trauma, not anticoag's.  Patient states that she is currently on Plavix, she denies chest pain shortness of breath stomach  pain nausea vomiting.    Home Medications Prior to Admission medications   Medication Sig Start Date End Date Taking? Authorizing Provider  atorvastatin (LIPITOR) 40 MG tablet Take 1 tablet (40 mg total) by mouth daily. 08/05/22   Grayce Sessions, NP  hydrochlorothiazide (HYDRODIURIL) 25 MG tablet Take 1 tablet (25 mg total) by mouth once daily. 02/08/22   Hoy Register, MD  losartan (COZAAR) 50 MG tablet Take 1 tablet (50 mg total) by mouth once daily. 02/08/22   Hoy Register, MD  metFORMIN (GLUCOPHAGE-XR) 500 MG 24 hr tablet Take 1 tablet (500 mg total) by mouth daily with breakfast. 05/07/22   Grayce Sessions, NP  metroNIDAZOLE (FLAGYL) 500 MG tablet Take 1 tablet (500 mg total) by mouth 2 (two) times daily. Patient not taking: Reported on 01/16/2023 08/07/22   Grayce Sessions, NP  traZODone (DESYREL) 50 MG tablet Take 0.5-1 tablets (25-50 mg total) by mouth at bedtime. 01/16/23   Grayce Sessions, NP      Allergies    Nsaids and Aspirin    Review of Systems    Review of Systems  Constitutional:  Negative for chills and fever.  Respiratory:  Negative for shortness of breath.   Cardiovascular:  Negative for chest pain.  Gastrointestinal:  Negative for abdominal pain.  Neurological:  Positive for weakness. Negative for headaches.    Physical Exam Updated Vital Signs BP (!) 142/90   Pulse 78   Temp 98.1 F (36.7 C) (Oral)   Resp 15   SpO2 98%  Physical Exam Vitals and nursing note reviewed.  Constitutional:      General: She is not in acute distress.    Appearance: She is not ill-appearing.  HENT:     Head: Normocephalic and atraumatic.     Nose: No congestion.  Eyes:     General: Visual field deficit present.     Conjunctiva/sclera: Conjunctivae normal.  Cardiovascular:     Rate and Rhythm: Normal rate and regular rhythm.     Pulses: Normal pulses.     Heart sounds: No murmur heard.    No friction rub. No gallop.  Pulmonary:     Effort: No respiratory distress.     Breath sounds: No wheezing, rhonchi or rales.  Abdominal:     Palpations: Abdomen is soft.     Tenderness: There is no abdominal tenderness. There is no right CVA tenderness or left CVA tenderness.  Skin:    General: Skin is warm and dry.  Neurological:     Mental  Status: She is alert.     GCS: GCS eye subscore is 4. GCS verbal subscore is 5. GCS motor subscore is 6.     Cranial Nerves: Facial asymmetry present.     Sensory: Sensory deficit present.     Motor: Weakness present.     Coordination: Romberg sign positive. Finger-Nose-Finger Test abnormal.     Comments: Patient has noted left facial droop, with slurred speech, no difficulty with word finding, she is able to follow two-step commands, she has noted weakness in the left upper and left lower 3-5 strength, left arm drift, difficulty with finger-to-nose,  With decreased sensation to light touch both left upper and left lower.  Patient has noted decrease peripheral field vision left temporal aspect.   Psychiatric:        Mood and Affect: Mood normal.     ED Results / Procedures / Treatments   Labs (all labs ordered are listed, but only abnormal results are displayed) Labs Reviewed  CBC WITH DIFFERENTIAL/PLATELET - Abnormal; Notable for the following components:      Result Value   RBC 5.50 (*)    MCV 78.2 (*)    MCH 24.4 (*)    All other components within normal limits  BASIC METABOLIC PANEL - Abnormal; Notable for the following components:   Glucose, Bld 122 (*)    Anion gap 17 (*)    All other components within normal limits  CBG MONITORING, ED - Abnormal; Notable for the following components:   Glucose-Capillary 129 (*)    All other components within normal limits  PROTIME-INR  RAPID URINE DRUG SCREEN, HOSP PERFORMED  TROPONIN I (HIGH SENSITIVITY)    EKG None  Radiology CT ANGIO HEAD NECK W WO CM  Result Date: 02/01/2023 CLINICAL DATA:  Left-sided weakness and slurred speech EXAM: CT ANGIOGRAPHY HEAD AND NECK WITH AND WITHOUT CONTRAST TECHNIQUE: Multidetector CT imaging of the head and neck was performed using the standard protocol during bolus administration of intravenous contrast. Multiplanar CT image reconstructions and MIPs were obtained to evaluate the vascular anatomy. Carotid stenosis measurements (when applicable) are obtained utilizing NASCET criteria, using the distal internal carotid diameter as the denominator. RADIATION DOSE REDUCTION: This exam was performed according to the departmental dose-optimization program which includes automated exposure control, adjustment of the mA and/or kV according to patient size and/or use of iterative reconstruction technique. CONTRAST:  75mL OMNIPAQUE IOHEXOL 350 MG/ML SOLN COMPARISON:  Brain MRI from earlier today FINDINGS: CTA NECK FINDINGS Aortic arch: Normal with 3 vessel branching Right carotid system: Mixed density plaque at the bulb. No significant stenosis and no ulceration. Left carotid system: Extensively plaque at  the bifurcation. No significant stenosis and no ulceration. Vertebral arteries: No proximal subclavian or vertebral stenosis. Skeleton: Advanced cervical spine degeneration Other neck: Negative Upper chest: No acute finding Review of the MIP images confirms the above findings CTA HEAD FINDINGS Anterior circulation: Atheromatous calcification of the carotid siphons. No branch occlusion, beading, or aneurysm. No flow limiting stenosis. Posterior circulation: No branch occlusion, beading, or aneurysm. No flow limiting stenosis. Venous sinuses: Negative for the arterial phase Anatomic variants: None significant Review of the MIP images confirms the above findings IMPRESSION: No emergent vascular finding. Atherosclerosis in the head and neck without flow limiting stenosis or ulceration of major vessels. Electronically Signed   By: Tiburcio Pea M.D.   On: 02/01/2023 04:13   MR BRAIN WO CONTRAST  Result Date: 02/01/2023 CLINICAL DATA:  Initial evaluation for neuro deficit, stroke.  EXAM: MRI HEAD WITHOUT CONTRAST TECHNIQUE: Multiplanar, multiecho pulse sequences of the brain and surrounding structures were obtained without intravenous contrast. COMPARISON:  Prior CT from 01/31/2023. FINDINGS: Brain: Cerebral volume within normal limits. Patchy T2/FLAIR hyperintensity involving the periventricular deep white matter both cerebral hemispheres as well as the pons, most consistent with chronic small vessel ischemic disease, moderate to advanced for age. Multiple scattered superimposed remote lacunar infarcts present about the bilateral basal ganglia, thalami, pons, and cerebellum. Multiple scattered chronic micro hemorrhages seen about the cerebellum, brainstem, and left greater than right deep gray nuclei, most characteristic of chronic poorly controlled hypertension. 9 mm acute ischemic nonhemorrhagic infarct present at the posterior right frontal centrum semi ovale (series 5, image 90). No associated mass effect. No  other evidence for acute or subacute ischemia. No acute intracranial hemorrhage. No mass lesion, midline shift or mass effect. No hydrocephalus or extra-axial fluid collection. Pituitary gland suprasellar region within normal limits. Vascular: Major intracranial vascular flow voids are maintained. Skull and upper cervical spine: Craniocervical junction within normal limits. Bone marrow signal intensity normal. No scalp soft tissue abnormality. Sinuses/Orbits: Globes and orbital soft tissues within normal limits. Paranasal sinuses are largely clear. No significant mastoid effusion. Other: None. IMPRESSION: 1. 9 mm acute ischemic nonhemorrhagic infarct involving the posterior right frontal centrum semi ovale. 2. Underlying moderate to advanced chronic microvascular ischemic disease with multiple remote lacunar infarcts involving the bilateral basal ganglia, thalami, pons, and cerebellum. 3. Multiple scattered chronic micro hemorrhages as above, she most characteristic of chronic poorly controlled hypertension. Electronically Signed   By: Rise Mu M.D.   On: 02/01/2023 03:06   CT Head Wo Contrast  Result Date: 01/31/2023 CLINICAL DATA:  Left-sided weakness. EXAM: CT HEAD WITHOUT CONTRAST TECHNIQUE: Contiguous axial images were obtained from the base of the skull through the vertex without intravenous contrast. RADIATION DOSE REDUCTION: This exam was performed according to the departmental dose-optimization program which includes automated exposure control, adjustment of the mA and/or kV according to patient size and/or use of iterative reconstruction technique. COMPARISON:  April 10, 2022 FINDINGS: Brain: There is mild cerebral atrophy with widening of the extra-axial spaces and ventricular dilatation. There are areas of decreased attenuation within the white matter tracts of the supratentorial brain, consistent with microvascular disease changes. This is greater than expected for the patient's age.  Small, chronic bilateral basal ganglia lacunar infarcts are seen. Vascular: No hyperdense vessel or unexpected calcification. Skull: Normal. Negative for fracture or focal lesion. Sinuses/Orbits: No acute finding. Other: None. IMPRESSION: 1. Mild cerebral atrophy and microvascular disease changes of the supratentorial brain, greater than expected for the patient's age. 2. Small, chronic bilateral basal ganglia lacunar infarcts. 3. No acute intracranial abnormality. Electronically Signed   By: Aram Candela M.D.   On: 01/31/2023 19:07   DG Chest Portable 1 View  Result Date: 01/31/2023 CLINICAL DATA:  Chest pain EXAM: PORTABLE CHEST 1 VIEW COMPARISON:  X-ray 02/04/2022 FINDINGS: Slight linear opacity lung bases, right-greater-than-left. No consolidation, pneumothorax or edema. Normal cardiopericardial silhouette. Calcified aorta. Artifact from the patient's clothing. IMPRESSION: Mild basilar atelectasis, right-greater-than-left. Electronically Signed   By: Karen Kays M.D.   On: 01/31/2023 17:55    Procedures Procedures    Medications Ordered in ED Medications  LORazepam (ATIVAN) injection 0.5 mg (0.5 mg Intravenous Given 02/01/23 0205)  iohexol (OMNIPAQUE) 350 MG/ML injection 75 mL (75 mLs Intravenous Contrast Given 02/01/23 0357)    ED Course/ Medical Decision Making/ A&P  Medical Decision Making Amount and/or Complexity of Data Reviewed Labs: ordered. Radiology: ordered.  Risk Prescription drug management. Decision regarding hospitalization.   This patient presents to the ED for concern of weakness, this involves an extensive number of treatment options, and is a complaint that carries with it a high risk of complications and morbidity.  The differential diagnosis includes CVA, intracranial bleed, meningitis, metabolic abnormality    Additional history obtained:  Additional history obtained from EMS External records from outside source obtained and  reviewed including recent hospitalization   Co morbidities that complicate the patient evaluation  Current smoker, diabetes, hypertension  Social Determinants of Health:  N/A    Lab Tests:  I Ordered, and personally interpreted labs.  The pertinent results include: CBC unremarkable, BMP reveals glucose 122 and gap of 17, for troponin is negative, CBG 129 prothrombin time and INR unremarkable   Imaging Studies ordered:  I ordered imaging studies including chest x-ray, CT head, CT angio head and neck, MRI brain I independently visualized and interpreted imaging which showed chest x-ray unremarkable, CT head, CT angio head and neck both negative acute findings, MRI reveals 9 mm acute ischemic nonhemorrhagic infarct along the right frontal centrum. I agree with the radiologist interpretation   Cardiac Monitoring:  The patient was maintained on a cardiac monitor.  I personally viewed and interpreted the cardiac monitored which showed an underlying rhythm of: EKG without signs of ischemia   Medicines ordered and prescription drug management:  I ordered medication including Ativan I have reviewed the patients home medicines and have made adjustments as needed  Critical Interventions:  N/A   Reevaluation:  Present with left-sided weakness, triage obtain basic lab workup and imaging which I personally viewed will add on MRI as I am concerned for likely stroke.  MRI confirms stroke we will consult with neurology  Update patient on  recommendation she is in agreement with admission  Consultations Obtained:  I requested consultation with the Dr. Amada Jupiter,  and discussed lab and imaging findings as well as pertinent plan - they recommend: Recommends CT angio head and neck, admit to medicine for stroke workup Spoke with Dr. Antionette Char who will admit the patient.    Test Considered:  N/A    Rule out low suspicion for internal head bleed and or mass as CT imaging is  negative for acute findings.  Low suspicion for dissection of the vertebral or carotid artery as CT imaging is negative.  Low suspicion for meningitis as she has no meningeal sign present.  There is no the patient does have a slightly elevated anion gap but I suspect this is likely secondary due to dehydration, I doubt DKA or HHS as there is no decrease in CO2, there is no significant increase in glucose.  I doubt elevated anion gap is from infection likely lactic acid she is nontoxic-appearing vital signs are reassuring.  Doubt ACS denies any chest pain EKG without signs of ischemia troponin is negative     Dispostion and problem list  After consideration of the diagnostic results and the patients response to treatment, I feel that the patent would benefit from admission.  CVA-patient will need to be started on antiplatelet therapy, will require additional stroke workup and outpatient follow-up with neurology.            Final Clinical Impression(s) / ED Diagnoses Final diagnoses:  Cerebral infarction, unspecified mechanism (HCC)    Rx / DC Orders ED Discharge Orders     None  Carroll Sage, PA-C 02/01/23 0434    Tilden Fossa, MD 02/01/23 [phone]

## 2023-02-01 NOTE — ED Notes (Signed)
Pt urinated and defecated on the floor, unable to collect urine sample at this time.

## 2023-02-01 NOTE — Progress Notes (Signed)
SLP Cancellation Note  Patient Details Name: Roberta Calderon MRN: [id number] DOB: [birth year]   Cancelled treatment:       Reason Eval/Treat Not Completed: Patient declined, no reason specified (Pt was approached for speech-language-cognition evaluation. However, pt became increasingly verbally agitated as SLP attempted to establish pt's baseline and initiate the evaluation. Pt  ultimately stated, "I ain't got time for this!" and requested that the TV be unmuted so that she can watch TV instead. SLP will follow up on a subsequent date.)  [name] I. Vear Clock, MS, CCC-SLP Neuro Diagnostic Specialist  Acute Rehabilitation Services Office number: [phone]  [name] 02/01/2023, 4:25 PM

## 2023-02-01 NOTE — H&P (Signed)
History and Physical    JORJA EMPIE EAV:[id number] DOB: [birth year] DOA: 01/31/2023  PCP: Grayce Sessions, NP   Patient coming from: Home   Chief Complaint: Left-sided weakness, speech difficulty   HPI: Roberta Calderon is a 52 y.o. female with medical history significant for hypertension, type 2 diabetes mellitus, CVA with right-sided weakness, and history of substance abuse in remission who presents to the emergency department with left-sided weakness and speech difficulty.  Patient reports going to bed in her usual state of health.  Approximately 10 PM on 01/29/2023 but woke around 7 AM the following morning with left-sided weakness and speech difficulty.  With symptoms failing to improve, patient came into the ED for evaluation.  ED Course: Upon arrival to the ED, patient is found to be afebrile and saturating well on room air with normal heart rate and stable blood pressure.  EKG demonstrates sinus rhythm.  No acute intracranial abnormality is noted on head CT.  MRI brain reveals acute ischemic infarction in the posterior right frontal centrum ovale as well as scattered microhemorrhages.  CTA head and neck was negative for emergent findings.  Neurology was consulted by the ED PA and hospitalists were asked to admit for stroke workup.  Review of Systems:  All other systems reviewed and apart from HPI, are negative.  Past Medical History:  Diagnosis Date   Cocaine abuse (HCC) 08/14/2021   Hypertension    Non-insulin dependent type 2 diabetes mellitus (HCC) 02/01/2023   Stroke (cerebrum) (HCC) 04/10/2022    History reviewed. No pertinent surgical history.  Social History:   reports that she has been smoking cigarettes. She has been smoking an average of .25 packs per day. She has never used smokeless tobacco. She reports current alcohol use. She reports that she does not use drugs.  Allergies  Allergen Reactions   Nsaids Nausea And Vomiting and Rash   Aspirin Hives     Family History  Family history unknown: Yes     Prior to Admission medications   Medication Sig Start Date End Date Taking? Authorizing Provider  atorvastatin (LIPITOR) 40 MG tablet Take 1 tablet (40 mg total) by mouth daily. 08/05/22   Grayce Sessions, NP  hydrochlorothiazide (HYDRODIURIL) 25 MG tablet Take 1 tablet (25 mg total) by mouth once daily. 02/08/22   Hoy Register, MD  losartan (COZAAR) 50 MG tablet Take 1 tablet (50 mg total) by mouth once daily. 02/08/22   Hoy Register, MD  metFORMIN (GLUCOPHAGE-XR) 500 MG 24 hr tablet Take 1 tablet (500 mg total) by mouth daily with breakfast. 05/07/22   Grayce Sessions, NP  metroNIDAZOLE (FLAGYL) 500 MG tablet Take 1 tablet (500 mg total) by mouth 2 (two) times daily. Patient not taking: Reported on 01/16/2023 08/07/22   Grayce Sessions, NP  traZODone (DESYREL) 50 MG tablet Take 0.5-1 tablets (25-50 mg total) by mouth at bedtime. 01/16/23   Grayce Sessions, NP    Physical Exam: Vitals:   02/01/23 0125 02/01/23 0200 02/01/23 0330 02/01/23 0400  BP:  (!) 144/82 (!) 141/86 (!) 142/90  Pulse:  68  78  Resp:  15 13 15   Temp: 98.1 F (36.7 C)     TempSrc: Oral     SpO2:  98%  98%    Constitutional: NAD, no pallor or cyanosis  Eyes: PERTLA, lids and conjunctivae normal ENMT: Mucous membranes are moist. Posterior pharynx clear of any exudate or lesions.   Neck: supple, no masses  Respiratory: no  wheezing, no crackles. No accessory muscle use.  Cardiovascular: S1 & S2 heard, regular rate and rhythm. No extremity edema.   Abdomen: No distension, no tenderness, soft. Bowel sounds active.  Musculoskeletal: no clubbing / cyanosis. No joint deformity upper and lower extremities.   Skin: no significant rashes, lesions, ulcers. Warm, dry, well-perfused. Neurologic: CN 2-12 grossly intact. Sensation to light touch diminished on left. LUE and LLE strength is 3/5, RUE and RLE strength is 4/5. Sleeping, wakes to voice and  oriented to person, place, and situation.  Psychiatric: Calm. Cooperative.    Labs and Imaging on Admission: I have personally reviewed following labs and imaging studies  CBC: Recent Labs  Lab 01/31/23 1650  WBC 7.1  NEUTROABS 3.1  HGB 13.4  HCT 43.0  MCV 78.2*  PLT 339   Basic Metabolic Panel: Recent Labs  Lab 01/31/23 1650  NA 137  K 3.7  CL 98  CO2 22  GLUCOSE 122*  BUN 11  CREATININE 0.72  CALCIUM 9.8   GFR: CrCl cannot be calculated (Unknown ideal weight.). Liver Function Tests: No results for input(s): "AST", "ALT", "ALKPHOS", "BILITOT", "PROT", "ALBUMIN" in the last 168 hours. No results for input(s): "LIPASE", "AMYLASE" in the last 168 hours. No results for input(s): "AMMONIA" in the last 168 hours. Coagulation Profile: Recent Labs  Lab 02/01/23 0147  INR 1.0   Cardiac Enzymes: No results for input(s): "CKTOTAL", "CKMB", "CKMBINDEX", "TROPONINI" in the last 168 hours. BNP (last 3 results) No results for input(s): "PROBNP" in the last 8760 hours. HbA1C: No results for input(s): "HGBA1C" in the last 72 hours. CBG: Recent Labs  Lab 01/31/23 1651  GLUCAP 129*   Lipid Profile: No results for input(s): "CHOL", "HDL", "LDLCALC", "TRIG", "CHOLHDL", "LDLDIRECT" in the last 72 hours. Thyroid Function Tests: No results for input(s): "TSH", "T4TOTAL", "FREET4", "T3FREE", "THYROIDAB" in the last 72 hours. Anemia Panel: No results for input(s): "VITAMINB12", "FOLATE", "FERRITIN", "TIBC", "IRON", "RETICCTPCT" in the last 72 hours. Urine analysis:    Component Value Date/Time   COLORURINE YELLOW 04/10/2022 1338   APPEARANCEUR CLOUDY (A) 04/10/2022 1338   LABSPEC 1.021 04/10/2022 1338   PHURINE 5.0 04/10/2022 1338   GLUCOSEU NEGATIVE 04/10/2022 1338   HGBUR NEGATIVE 04/10/2022 1338   BILIRUBINUR NEGATIVE 04/10/2022 1338   KETONESUR NEGATIVE 04/10/2022 1338   PROTEINUR NEGATIVE 04/10/2022 1338   NITRITE NEGATIVE 04/10/2022 1338   LEUKOCYTESUR TRACE (A)  04/10/2022 1338   Sepsis Labs: @LABRCNTIP (procalcitonin:4,lacticidven:4) )No results found for this or any previous visit (from the past 240 hour(s)).   Radiological Exams on Admission: CT ANGIO HEAD NECK W WO CM  Result Date: 02/01/2023 CLINICAL DATA:  Left-sided weakness and slurred speech EXAM: CT ANGIOGRAPHY HEAD AND NECK WITH AND WITHOUT CONTRAST TECHNIQUE: Multidetector CT imaging of the head and neck was performed using the standard protocol during bolus administration of intravenous contrast. Multiplanar CT image reconstructions and MIPs were obtained to evaluate the vascular anatomy. Carotid stenosis measurements (when applicable) are obtained utilizing NASCET criteria, using the distal internal carotid diameter as the denominator. RADIATION DOSE REDUCTION: This exam was performed according to the departmental dose-optimization program which includes automated exposure control, adjustment of the mA and/or kV according to patient size and/or use of iterative reconstruction technique. CONTRAST:  75mL OMNIPAQUE IOHEXOL 350 MG/ML SOLN COMPARISON:  Brain MRI from earlier today FINDINGS: CTA NECK FINDINGS Aortic arch: Normal with 3 vessel branching Right carotid system: Mixed density plaque at the bulb. No significant stenosis and no ulceration. Left carotid system:  Extensively plaque at the bifurcation. No significant stenosis and no ulceration. Vertebral arteries: No proximal subclavian or vertebral stenosis. Skeleton: Advanced cervical spine degeneration Other neck: Negative Upper chest: No acute finding Review of the MIP images confirms the above findings CTA HEAD FINDINGS Anterior circulation: Atheromatous calcification of the carotid siphons. No branch occlusion, beading, or aneurysm. No flow limiting stenosis. Posterior circulation: No branch occlusion, beading, or aneurysm. No flow limiting stenosis. Venous sinuses: Negative for the arterial phase Anatomic variants: None significant Review of the  MIP images confirms the above findings IMPRESSION: No emergent vascular finding. Atherosclerosis in the head and neck without flow limiting stenosis or ulceration of major vessels. Electronically Signed   By: Tiburcio Pea M.D.   On: 02/01/2023 04:13   MR BRAIN WO CONTRAST  Result Date: 02/01/2023 CLINICAL DATA:  Initial evaluation for neuro deficit, stroke. EXAM: MRI HEAD WITHOUT CONTRAST TECHNIQUE: Multiplanar, multiecho pulse sequences of the brain and surrounding structures were obtained without intravenous contrast. COMPARISON:  Prior CT from 01/31/2023. FINDINGS: Brain: Cerebral volume within normal limits. Patchy T2/FLAIR hyperintensity involving the periventricular deep white matter both cerebral hemispheres as well as the pons, most consistent with chronic small vessel ischemic disease, moderate to advanced for age. Multiple scattered superimposed remote lacunar infarcts present about the bilateral basal ganglia, thalami, pons, and cerebellum. Multiple scattered chronic micro hemorrhages seen about the cerebellum, brainstem, and left greater than right deep gray nuclei, most characteristic of chronic poorly controlled hypertension. 9 mm acute ischemic nonhemorrhagic infarct present at the posterior right frontal centrum semi ovale (series 5, image 90). No associated mass effect. No other evidence for acute or subacute ischemia. No acute intracranial hemorrhage. No mass lesion, midline shift or mass effect. No hydrocephalus or extra-axial fluid collection. Pituitary gland suprasellar region within normal limits. Vascular: Major intracranial vascular flow voids are maintained. Skull and upper cervical spine: Craniocervical junction within normal limits. Bone marrow signal intensity normal. No scalp soft tissue abnormality. Sinuses/Orbits: Globes and orbital soft tissues within normal limits. Paranasal sinuses are largely clear. No significant mastoid effusion. Other: None. IMPRESSION: 1. 9 mm acute  ischemic nonhemorrhagic infarct involving the posterior right frontal centrum semi ovale. 2. Underlying moderate to advanced chronic microvascular ischemic disease with multiple remote lacunar infarcts involving the bilateral basal ganglia, thalami, pons, and cerebellum. 3. Multiple scattered chronic micro hemorrhages as above, she most characteristic of chronic poorly controlled hypertension. Electronically Signed   By: Rise Mu M.D.   On: 02/01/2023 03:06   CT Head Wo Contrast  Result Date: 01/31/2023 CLINICAL DATA:  Left-sided weakness. EXAM: CT HEAD WITHOUT CONTRAST TECHNIQUE: Contiguous axial images were obtained from the base of the skull through the vertex without intravenous contrast. RADIATION DOSE REDUCTION: This exam was performed according to the departmental dose-optimization program which includes automated exposure control, adjustment of the mA and/or kV according to patient size and/or use of iterative reconstruction technique. COMPARISON:  April 10, 2022 FINDINGS: Brain: There is mild cerebral atrophy with widening of the extra-axial spaces and ventricular dilatation. There are areas of decreased attenuation within the white matter tracts of the supratentorial brain, consistent with microvascular disease changes. This is greater than expected for the patient's age. Small, chronic bilateral basal ganglia lacunar infarcts are seen. Vascular: No hyperdense vessel or unexpected calcification. Skull: Normal. Negative for fracture or focal lesion. Sinuses/Orbits: No acute finding. Other: None. IMPRESSION: 1. Mild cerebral atrophy and microvascular disease changes of the supratentorial brain, greater than expected for the patient's age. 2.  Small, chronic bilateral basal ganglia lacunar infarcts. 3. No acute intracranial abnormality. Electronically Signed   By: Aram Candela M.D.   On: 01/31/2023 19:07   DG Chest Portable 1 View  Result Date: 01/31/2023 CLINICAL DATA:  Chest pain  EXAM: PORTABLE CHEST 1 VIEW COMPARISON:  X-ray 02/04/2022 FINDINGS: Slight linear opacity lung bases, right-greater-than-left. No consolidation, pneumothorax or edema. Normal cardiopericardial silhouette. Calcified aorta. Artifact from the patient's clothing. IMPRESSION: Mild basilar atelectasis, right-greater-than-left. Electronically Signed   By: Karen Kays M.D.   On: 01/31/2023 17:55    EKG: Independently reviewed. Sinus rhythm.   Assessment/Plan   1. Acute CVA  - Appreciate neurology consultation  - Continue cardiac monitoring and frequent neuro checks, check echo, lipids, and A1c, consult PT/OT/SLP, start ASA 81 and Plavix 75 qD   2. Type II DM  - A1c was 6.3% in August 2023  - Check CBGs and use low-intensity SSI for now    3. Hypertension  - Appears to be outside timeframe for permissive HTN  - Continue losartan and HCTZ   4. Depression  - Continue trazodone     DVT prophylaxis: Lovenox  Code Status: Full  Level of Care: Level of care: Telemetry Medical Family Communication: None present  Disposition Plan:  Patient is from: home  Anticipated d/c is to: TBD Anticipated d/c date is: 02/02/23  Patient currently: Pending CVA workup  Consults called: Neurology  Admission status: Observation     Briscoe Deutscher, MD [facility]  02/01/2023, 4:35 AM

## 2023-02-01 NOTE — Consult Note (Signed)
Neurology Consultation Reason for Consult: Stroke Referring Physician: Uvaldo Rising, W  CC: Left-sided weakness  History is obtained from: Patient  HPI: Roberta Calderon is a 52 y.o. female with a history of multiple previous strokes, hypertension, tobacco abuse, history of cocaine abuse (denies active use), who was last known well on Wednesday and then woke up on Thursday with left-sided weakness.  Due to the symptoms not improving, she sought care in the emergency department today.    LKW: Wednesday tnk given?: no, outside of window  Past Medical History:  Diagnosis Date   Cocaine abuse (HCC) 08/14/2021   Hypertension    Non-insulin dependent type 2 diabetes mellitus (HCC) 02/01/2023   Stroke (cerebrum) (HCC) 04/10/2022     Family History  Family history unknown: Yes     Social History:  reports that she has been smoking cigarettes. She has been smoking an average of .25 packs per day. She has never used smokeless tobacco. She reports current alcohol use. She reports that she does not use drugs.   Exam: Current vital signs: BP (!) 142/90   Pulse 78   Temp 98.1 F (36.7 C) (Oral)   Resp 15   SpO2 98%  Vital signs in last 24 hours: Temp:  [98.1 F (36.7 C)-98.5 F (36.9 C)] 98.1 F (36.7 C) (06/07 0125) Pulse Rate:  [68-79] 78 (06/07 0400) Resp:  [13-18] 15 (06/07 0400) BP: (141-159)/(82-138) 142/90 (06/07 0400) SpO2:  [97 %-100 %] 98 % (06/07 0400)   Physical Exam  Appears well-developed and well-nourished.   Neuro: Mental Status: Patient is awake, alert, oriented to person, place, gives month as May, year, and situation. Patient is able to give a clear and coherent history. No signs of aphasia or neglect Cranial Nerves: II: Visual Fields are full. Pupils are equal, round, and reactive to light.   III,IV, VI: EOMI without ptosis or diploplia.  V: Facial sensation is symmetric to temperature VII: Question a mild right facial weakness VIII: hearing is  intact to voice X: Uvula elevates symmetrically XI: Shoulder shrug is symmetric. XII: tongue is midline without atrophy or fasciculations.  Motor: Tone is normal. Bulk is normal.  She has 3-4/5 strength in the left arm and leg, 4/5 strength in the right leg, 4+/5 strength in the right arm Sensory: Sensation is diminished in the left arm and leg Cerebellar: No definite ataxia out of proportion to weakness     I have reviewed labs in epic and the results pertinent to this consultation are: Creatinine 0.72  I have reviewed the images obtained: MRI brain-small vessel appearing infarct in the white matter on the right  Impression: 52 year old female with a history of multiple previous strokes who presents with a recurrent small vessel ischemic stroke.  I had a discussion with her about tobacco abuse and need for cessation.  She denies any active ongoing cocaine abuse.  She will need admission for risk factor modification as well as physical therapy.  Recommendations: - HgbA1c, fasting lipid panel - Frequent neuro checks - Echocardiogram - CTA head and neck - Prophylactic therapy-Antiplatelet med: Aspirin - dose 81mg  and plavix 75mg  daily  - Risk factor modification - Telemetry monitoring - PT consult, OT consult, Speech consult - Stroke team to follow    Ritta Slot, MD [facility] [phone]  If 7pm- 7am, please page neurology on call as listed in AMION.

## 2023-02-01 NOTE — ED Notes (Signed)
Patient transported to MRI 

## 2023-02-01 NOTE — Progress Notes (Signed)
OT Cancellation Note  Patient Details Name: Roberta Calderon MRN: [id number] DOB: 07/10/1971   Cancelled Treatment:    Reason Eval/Treat Not Completed: Patient at procedure or test/ unavailable  Evern Bio 02/01/2023, 3:44 PM Berna Spare, OTR/L Acute Rehabilitation Services Office: [phone]

## 2023-02-01 NOTE — Progress Notes (Signed)
? ?  Inpatient Rehab Admissions Coordinator : ? ?Per therapy recommendations, patient was screened for CIR candidacy by Kassie Keng RN MSN.  At this time patient appears to be a potential candidate for CIR. I will place a rehab consult per protocol for full assessment. Please call me with any questions. ? ?Mildred Tuccillo RN MSN ?Admissions Coordinator ?[phone] ?  ?

## 2023-02-01 NOTE — Progress Notes (Signed)
  Echocardiogram 2D Echocardiogram has been performed.  Delcie Roch 02/01/2023, 4:21 PM

## 2023-02-02 DIAGNOSIS — I639 Cerebral infarction, unspecified: Secondary | ICD-10-CM | POA: Diagnosis not present

## 2023-02-02 LAB — CBC
HCT: 42.2 % (ref 36.0–46.0)
Hemoglobin: 13.6 g/dL (ref 12.0–15.0)
MCH: 24.7 pg — ABNORMAL LOW (ref 26.0–34.0)
MCHC: 32.2 g/dL (ref 30.0–36.0)
MCV: 76.6 fL — ABNORMAL LOW (ref 80.0–100.0)
Platelets: 326 10*3/uL (ref 150–400)
RBC: 5.51 MIL/uL — ABNORMAL HIGH (ref 3.87–5.11)
RDW: 14 % (ref 11.5–15.5)
WBC: 5.1 10*3/uL (ref 4.0–10.5)
nRBC: 0 % (ref 0.0–0.2)

## 2023-02-02 LAB — COMPREHENSIVE METABOLIC PANEL
ALT: 15 U/L (ref 0–44)
AST: 19 U/L (ref 15–41)
Albumin: 3.6 g/dL (ref 3.5–5.0)
Alkaline Phosphatase: 101 U/L (ref 38–126)
Anion gap: 11 (ref 5–15)
BUN: 16 mg/dL (ref 6–20)
CO2: 21 mmol/L — ABNORMAL LOW (ref 22–32)
Calcium: 9.3 mg/dL (ref 8.9–10.3)
Chloride: 103 mmol/L (ref 98–111)
Creatinine, Ser: 0.85 mg/dL (ref 0.44–1.00)
GFR, Estimated: >60 mL/min (ref >60)
Glucose, Bld: 129 mg/dL — ABNORMAL HIGH (ref 70–99)
Potassium: 3.8 mmol/L (ref 3.5–5.1)
Sodium: 135 mmol/L (ref 135–145)
Total Bilirubin: 0.6 mg/dL (ref 0.3–1.2)
Total Protein: 6.8 g/dL (ref 6.5–8.1)

## 2023-02-02 LAB — GLUCOSE, CAPILLARY
Glucose-Capillary: 145 mg/dL — ABNORMAL HIGH (ref 70–99)
Glucose-Capillary: 134 mg/dL — ABNORMAL HIGH (ref 70–99)
Glucose-Capillary: 136 mg/dL — ABNORMAL HIGH (ref 70–99)
Glucose-Capillary: 163 mg/dL — ABNORMAL HIGH (ref 70–99)

## 2023-02-02 MED ORDER — CLOPIDOGREL BISULFATE 75 MG PO TABS: 75.0000 mg | ORAL_TABLET | Freq: Every day | ORAL | 0 refills | Status: DC

## 2023-02-02 MED ORDER — HYDROCHLOROTHIAZIDE 25 MG PO TABS
25.0000 mg | ORAL_TABLET | Freq: Every day | ORAL | 3 refills | Status: DC
Start: 2023-02-02 — End: 2023-02-04

## 2023-02-02 MED ORDER — ATORVASTATIN CALCIUM 80 MG PO TABS: 80.0000 mg | ORAL_TABLET | Freq: Every day | ORAL | 0 refills | Status: DC

## 2023-02-02 MED ORDER — LOSARTAN POTASSIUM 100 MG PO TABS: 100.0000 mg | ORAL_TABLET | Freq: Every day | ORAL | 0 refills | Status: DC

## 2023-02-02 NOTE — Progress Notes (Signed)
Inpatient Rehab Admissions Coordinator:    I spoke with pt.'s mother and she states that she will provide care for patient after d/c for CIR. I will follow for potential admit pending bed availability.   Megan Salon, MS, CCC-SLP Rehab Admissions Coordinator  [phone] (celll) [phone] (office)

## 2023-02-02 NOTE — Progress Notes (Signed)
PROGRESS NOTE    Roberta Calderon  UJW:[id number] DOB: [birth year] DOA: 01/31/2023 PCP: Grayce Sessions, NP   Brief Narrative:  This is a 52 year old lady with past medical history of hypertension, type 2 diabetes mellitus and multiple strokes as well as history of substance abuse and currently nicotine dependence came in with left-sided weakness and speech difficulty and admitted for new acute ischemic stroke. Seen by neurology.  She has been started on Plavix (she is not on aspirin due to allergy).  She is also on atorvastatin.  She has hyperlipidemia.  CTA head and neck negative for large vessel obstruction but she does have atherosclerosis in the head and neck without flow-limiting stenosis.  Seen by PT OT who recommended CIR.  Patient stable waiting for placement to CIR.  Assessment & Plan:   Principal Problem:   Acute CVA (cerebrovascular accident) [facility]) Active Problems:   Hypertension   Non-insulin dependent type 2 diabetes mellitus (HCC)  Acute ischemic CVA/hypertension-hyperlipidemia: Left upper and lower extremity weakness gradually improving.  Continue Plavix, atorvastatin.  Blood pressure controlled.  PT OT recommended CIR.  Pending CIR placement.  Patient agreeable.  Continue current antihypertensives.  Type 2 diabetes mellitus: Hemoglobin A1c 6.5.  Continue SSI.  DVT prophylaxis: enoxaparin (LOVENOX) injection 40 mg Start: 02/01/23 1000   Code Status: Full Code  Family Communication:  None present at bedside.  Plan of care discussed with patient in length and he/she verbalized understanding and agreed with it.  Status is: Observation The patient will require care spanning > 2 midnights and should be moved to inpatient because: Patient medically stable but needs placement to CIR   Estimated body mass index is 26.09 kg/m as calculated from the following:   Height as of this encounter: 5\' 3"  (1.6 m).   Weight as of this encounter: 66.8 kg.    Nutritional  Assessment: Body mass index is 26.09 kg/m.Marland Kitchen Seen by dietician.  I agree with the assessment and plan as outlined below: Nutrition Status:        . Skin Assessment: I have examined the patient's skin and I agree with the wound assessment as performed by the wound care RN as outlined below:    Consultants:  Neurology-signed off  Procedures:  None  Antimicrobials:  Anti-infectives (From admission, onward)    None         Subjective: Patient seen and examined.  She has no complaints.  Her weakness is improving.  Objective: Vitals:   02/02/23 0023 02/02/23 0336 02/02/23 0919 02/02/23 1226  BP: 114/70 104/61 108/71 119/77  Pulse: 79 86 79 84  Resp: 18 18 19 18   Temp: 98.6 F (37 C) 98.2 F (36.8 C) 98 F (36.7 C) 97.9 F (36.6 C)  TempSrc: Axillary Oral Oral Oral  SpO2: 96% 100% 95% 97%  Weight:      Height:        Intake/Output Summary (Last 24 hours) at 02/02/2023 1325 Last data filed at 02/02/2023 0830 Gross per 24 hour  Intake 230 ml  Output --  Net 230 ml   Filed Weights   02/01/23 1240  Weight: 66.8 kg    Examination:  General exam: Appears calm and comfortable  Respiratory system: Clear to auscultation. Respiratory effort normal. Cardiovascular system: S1 & S2 heard, RRR. No JVD, murmurs, rubs, gallops or clicks. No pedal edema. Gastrointestinal system: Abdomen is nondistended, soft and nontender. No organomegaly or masses felt. Normal bowel sounds heard. Central nervous system: Alert and oriented.  4/5  power in left upper and lower extremity. Extremities: Symmetric 5 x 5 power. Skin: No rashes, lesions or ulcers Psychiatry: Judgement and insight appear normal. Mood & affect appropriate.    Data Reviewed: I have personally reviewed following labs and imaging studies  CBC: Recent Labs  Lab 01/31/23 1650 02/01/23 0449 02/02/23 0332  WBC 7.1 5.8 5.1  NEUTROABS 3.1  --   --   HGB 13.4 12.7 13.6  HCT 43.0 41.3 42.2  MCV 78.2* 77.1* 76.6*   PLT 339 326 326   Basic Metabolic Panel: Recent Labs  Lab 01/31/23 1650 02/01/23 0449 02/02/23 0332  NA 137 135 135  K 3.7 3.4* 3.8  CL 98 101 103  CO2 22 22 21*  GLUCOSE 122* 109* 129*  BUN 11 10 16   CREATININE 0.72 0.68 0.85  CALCIUM 9.8 9.1 9.3   GFR: Estimated Creatinine Clearance: 71.1 mL/min (by C-G formula based on SCr of 0.85 mg/dL). Liver Function Tests: Recent Labs  Lab 02/01/23 0449 02/02/23 0332  AST 19 19  ALT 13 15  ALKPHOS 87 101  BILITOT 0.6 0.6  PROT 6.7 6.8  ALBUMIN 3.6 3.6   No results for input(s): "LIPASE", "AMYLASE" in the last 168 hours. No results for input(s): "AMMONIA" in the last 168 hours. Coagulation Profile: Recent Labs  Lab 02/01/23 0147  INR 1.0   Cardiac Enzymes: No results for input(s): "CKTOTAL", "CKMB", "CKMBINDEX", "TROPONINI" in the last 168 hours. BNP (last 3 results) No results for input(s): "PROBNP" in the last 8760 hours. HbA1C: Recent Labs    02/01/23 0500  HGBA1C 6.5*   CBG: Recent Labs  Lab 02/01/23 0734 02/01/23 1159 02/01/23 1934 02/02/23 0631 02/02/23 1225  GLUCAP 108* 88 113* 145* 134*   Lipid Profile: Recent Labs    02/01/23 0449  CHOL 220*  HDL 49  LDLCALC 153*  TRIG 90  CHOLHDL 4.5   Thyroid Function Tests: No results for input(s): "TSH", "T4TOTAL", "FREET4", "T3FREE", "THYROIDAB" in the last 72 hours. Anemia Panel: No results for input(s): "VITAMINB12", "FOLATE", "FERRITIN", "TIBC", "IRON", "RETICCTPCT" in the last 72 hours. Sepsis Labs: No results for input(s): "PROCALCITON", "LATICACIDVEN" in the last 168 hours.  No results found for this or any previous visit (from the past 240 hour(s)).   Radiology Studies: ECHOCARDIOGRAM COMPLETE  Result Date: 02/01/2023    ECHOCARDIOGRAM REPORT   Patient Name:   Roberta Calderon Date of Exam: 02/01/2023 Medical Rec #:  [id number]          Height:       63.0 in Accession #:    [id number]         Weight:       147.3 lb Date of Birth:  12-28-1970            BSA:          1.698 m Patient Age:    52 years           BP:           134/84 mmHg Patient Gender: F                  HR:           68 bpm. Exam Location:  Inpatient Procedure: Cardiac Doppler, Color Doppler and 2D Echo Indications:    stroke  History:        Patient has prior history of Echocardiogram examinations, most  recent 02/05/2022. Risk Factors:Hypertension, Dyslipidemia,                 Current Smoker and polysubstance abuse.  Sonographer:    Delcie Roch RDCS Referring Phys: [id number] TIMOTHY S OPYD IMPRESSIONS  1. Left ventricular ejection fraction, by estimation, is 60 to 65%. The left ventricle has normal function. The left ventricle has no regional wall motion abnormalities. There is moderate concentric left ventricular hypertrophy of the septal segment. Left ventricular diastolic parameters were normal.  2. Right ventricular systolic function is normal. The right ventricular size is normal.  3. The mitral valve is normal in structure. No evidence of mitral valve regurgitation. No evidence of mitral stenosis.  4. The aortic valve is normal in structure. There is mild calcification of the aortic valve. Aortic valve regurgitation is not visualized. No aortic stenosis is present.  5. The inferior vena cava is normal in size with greater than 50% respiratory variability, suggesting right atrial pressure of 3 mmHg. FINDINGS  Left Ventricle: Left ventricular ejection fraction, by estimation, is 60 to 65%. The left ventricle has normal function. The left ventricle has no regional wall motion abnormalities. The left ventricular internal cavity size was normal in size. There is  moderate concentric left ventricular hypertrophy of the septal segment. Left ventricular diastolic parameters were normal. Right Ventricle: The right ventricular size is normal. No increase in right ventricular wall thickness. Right ventricular systolic function is normal. Left Atrium: Left atrial size was  normal in size. Right Atrium: Right atrial size was normal in size. Pericardium: There is no evidence of pericardial effusion. Mitral Valve: The mitral valve is normal in structure. No evidence of mitral valve regurgitation. No evidence of mitral valve stenosis. Tricuspid Valve: The tricuspid valve is normal in structure. Tricuspid valve regurgitation is not demonstrated. No evidence of tricuspid stenosis. Aortic Valve: The aortic valve is normal in structure. There is mild calcification of the aortic valve. Aortic valve regurgitation is not visualized. No aortic stenosis is present. Pulmonic Valve: The pulmonic valve was normal in structure. Pulmonic valve regurgitation is not visualized. No evidence of pulmonic stenosis. Aorta: The aortic root is normal in size and structure. Venous: The inferior vena cava is normal in size with greater than 50% respiratory variability, suggesting right atrial pressure of 3 mmHg. IAS/Shunts: No atrial level shunt detected by color flow Doppler.  LEFT VENTRICLE PLAX 2D LVIDd:         3.00 cm Diastology LVIDs:         1.90 cm LV e' medial:    10.40 cm/s LV PW:         1.10 cm LV E/e' medial:  5.4 LV IVS:        1.20 cm LV e' lateral:   12.30 cm/s                        LV E/e' lateral: 4.6  RIGHT VENTRICLE         IVC TAPSE (M-mode): 2.4 cm  IVC diam: 1.30 cm LEFT ATRIUM           Index        RIGHT ATRIUM           Index LA diam:      2.70 cm 1.59 cm/m   RA Area:     10.10 cm LA Vol (A4C): 27.2 ml 16.02 ml/m  RA Volume:   19.30 ml  11.37 ml/m  AORTIC VALVE LVOT  Vmax:   110.00 cm/s LVOT Vmean:  68.800 cm/s LVOT VTI:    0.193 m  AORTA Ao Asc diam: 2.80 cm MITRAL VALVE MV Area (PHT): 3.53 cm    SHUNTS MV Decel Time: 215 msec    Systemic VTI: 0.19 m MV E velocity: 56.00 cm/s MV A velocity: 85.40 cm/s MV E/A ratio:  0.66 Aditya Sabharwal Electronically signed by Dorthula Nettles Signature Date/Time: 02/01/2023/4:39:22 PM    Final    CT ANGIO HEAD NECK W WO CM  Result Date:  02/01/2023 CLINICAL DATA:  Left-sided weakness and slurred speech EXAM: CT ANGIOGRAPHY HEAD AND NECK WITH AND WITHOUT CONTRAST TECHNIQUE: Multidetector CT imaging of the head and neck was performed using the standard protocol during bolus administration of intravenous contrast. Multiplanar CT image reconstructions and MIPs were obtained to evaluate the vascular anatomy. Carotid stenosis measurements (when applicable) are obtained utilizing NASCET criteria, using the distal internal carotid diameter as the denominator. RADIATION DOSE REDUCTION: This exam was performed according to the departmental dose-optimization program which includes automated exposure control, adjustment of the mA and/or kV according to patient size and/or use of iterative reconstruction technique. CONTRAST:  75mL OMNIPAQUE IOHEXOL 350 MG/ML SOLN COMPARISON:  Brain MRI from earlier today FINDINGS: CTA NECK FINDINGS Aortic arch: Normal with 3 vessel branching Right carotid system: Mixed density plaque at the bulb. No significant stenosis and no ulceration. Left carotid system: Extensively plaque at the bifurcation. No significant stenosis and no ulceration. Vertebral arteries: No proximal subclavian or vertebral stenosis. Skeleton: Advanced cervical spine degeneration Other neck: Negative Upper chest: No acute finding Review of the MIP images confirms the above findings CTA HEAD FINDINGS Anterior circulation: Atheromatous calcification of the carotid siphons. No branch occlusion, beading, or aneurysm. No flow limiting stenosis. Posterior circulation: No branch occlusion, beading, or aneurysm. No flow limiting stenosis. Venous sinuses: Negative for the arterial phase Anatomic variants: None significant Review of the MIP images confirms the above findings IMPRESSION: No emergent vascular finding. Atherosclerosis in the head and neck without flow limiting stenosis or ulceration of major vessels. Electronically Signed   By: Tiburcio Pea M.D.   On:  02/01/2023 04:13   MR BRAIN WO CONTRAST  Result Date: 02/01/2023 CLINICAL DATA:  Initial evaluation for neuro deficit, stroke. EXAM: MRI HEAD WITHOUT CONTRAST TECHNIQUE: Multiplanar, multiecho pulse sequences of the brain and surrounding structures were obtained without intravenous contrast. COMPARISON:  Prior CT from 01/31/2023. FINDINGS: Brain: Cerebral volume within normal limits. Patchy T2/FLAIR hyperintensity involving the periventricular deep white matter both cerebral hemispheres as well as the pons, most consistent with chronic small vessel ischemic disease, moderate to advanced for age. Multiple scattered superimposed remote lacunar infarcts present about the bilateral basal ganglia, thalami, pons, and cerebellum. Multiple scattered chronic micro hemorrhages seen about the cerebellum, brainstem, and left greater than right deep gray nuclei, most characteristic of chronic poorly controlled hypertension. 9 mm acute ischemic nonhemorrhagic infarct present at the posterior right frontal centrum semi ovale (series 5, image 90). No associated mass effect. No other evidence for acute or subacute ischemia. No acute intracranial hemorrhage. No mass lesion, midline shift or mass effect. No hydrocephalus or extra-axial fluid collection. Pituitary gland suprasellar region within normal limits. Vascular: Major intracranial vascular flow voids are maintained. Skull and upper cervical spine: Craniocervical junction within normal limits. Bone marrow signal intensity normal. No scalp soft tissue abnormality. Sinuses/Orbits: Globes and orbital soft tissues within normal limits. Paranasal sinuses are largely clear. No significant mastoid effusion. Other: None. IMPRESSION: 1.  9 mm acute ischemic nonhemorrhagic infarct involving the posterior right frontal centrum semi ovale. 2. Underlying moderate to advanced chronic microvascular ischemic disease with multiple remote lacunar infarcts involving the bilateral basal ganglia,  thalami, pons, and cerebellum. 3. Multiple scattered chronic micro hemorrhages as above, she most characteristic of chronic poorly controlled hypertension. Electronically Signed   By: Rise Mu M.D.   On: 02/01/2023 03:06   CT Head Wo Contrast  Result Date: 01/31/2023 CLINICAL DATA:  Left-sided weakness. EXAM: CT HEAD WITHOUT CONTRAST TECHNIQUE: Contiguous axial images were obtained from the base of the skull through the vertex without intravenous contrast. RADIATION DOSE REDUCTION: This exam was performed according to the departmental dose-optimization program which includes automated exposure control, adjustment of the mA and/or kV according to patient size and/or use of iterative reconstruction technique. COMPARISON:  April 10, 2022 FINDINGS: Brain: There is mild cerebral atrophy with widening of the extra-axial spaces and ventricular dilatation. There are areas of decreased attenuation within the white matter tracts of the supratentorial brain, consistent with microvascular disease changes. This is greater than expected for the patient's age. Small, chronic bilateral basal ganglia lacunar infarcts are seen. Vascular: No hyperdense vessel or unexpected calcification. Skull: Normal. Negative for fracture or focal lesion. Sinuses/Orbits: No acute finding. Other: None. IMPRESSION: 1. Mild cerebral atrophy and microvascular disease changes of the supratentorial brain, greater than expected for the patient's age. 2. Small, chronic bilateral basal ganglia lacunar infarcts. 3. No acute intracranial abnormality. Electronically Signed   By: Aram Candela M.D.   On: 01/31/2023 19:07   DG Chest Portable 1 View  Result Date: 01/31/2023 CLINICAL DATA:  Chest pain EXAM: PORTABLE CHEST 1 VIEW COMPARISON:  X-ray 02/04/2022 FINDINGS: Slight linear opacity lung bases, right-greater-than-left. No consolidation, pneumothorax or edema. Normal cardiopericardial silhouette. Calcified aorta. Artifact from the  patient's clothing. IMPRESSION: Mild basilar atelectasis, right-greater-than-left. Electronically Signed   By: Karen Kays M.D.   On: 01/31/2023 17:55    Scheduled Meds:  atorvastatin  80 mg Oral Daily   clopidogrel  75 mg Oral Daily   enoxaparin (LOVENOX) injection  40 mg Subcutaneous Q24H   hydrochlorothiazide  25 mg Oral Daily   insulin aspart  0-5 Units Subcutaneous QHS   insulin aspart  0-6 Units Subcutaneous TID WC   losartan  100 mg Oral Daily   nicotine  7 mg Transdermal Daily   traZODone  25-50 mg Oral QHS   Continuous Infusions:   LOS: 0 days   Hughie Closs, MD [facility]  02/02/2023, 1:25 PM   *Please note that this is a verbal dictation therefore any spelling or grammatical errors are due to the "[employer]agon [employer] One" system interpretation.  Please page via Amion and do not message via secure chat for urgent patient care matters. Secure chat can be used for non urgent patient care matters.  How to contact the [facility] Attending or Consulting provider 7A - 7P or covering provider during after hours 7P -7A, for this patient?  Check the care team in [facility] and look for a) attending/consulting [facility] provider listed and b) the [facility] team listed. Page or secure chat 7A-7P. Log into [email or web address] and use [facility]'s universal password to access. If you do not have the password, please contact the hospital operator. Locate the [facility] provider you are looking for under [facility] and page to a number that you can be directly reached. If you still have difficulty reaching the provider, please page the [facility] (Director on Call) for the  Hospitalists listed on amion for assistance.

## 2023-02-02 NOTE — Progress Notes (Addendum)
Inpatient Rehab Admissions Coordinator:    I met with pt. To discuss potential CIR admit. She is interested, but it is unclear if she has support at home. States she may be able to go stay with her daughter afterwards. I called daughter and Pt.'s mother and left voicmails with request for callback.    Addendum: I spoke with pt.'s daughter, Pedro Earls, who states that she cannot provide care to Pt. However, she states that Pt. Lives in a tiny house in her mother's yard and that she is always nearby.  Pt. States she does not get along with her mother and that she does not want her help. Pt. Also goes to day rehab 9-3pm during weekdays.   Megan Salon, MS, CCC-SLP Rehab Admissions Coordinator  [phone] (celll) [phone] (office)

## 2023-02-02 NOTE — Progress Notes (Signed)
Patient verbalizing that she needs to speak to the doctor. When asked what she was concerned about, she stated she wants to go to [facility]. Dr. Jacqulyn Bath notified of patient's desire to leave. Dr. Jacqulyn Bath spoke with patient, after clarification patient willing to stay and discharge to Inpatient Rehab when medically cleared and CIR room available.

## 2023-02-02 NOTE — Plan of Care (Signed)

## 2023-02-02 NOTE — Evaluation (Signed)
Occupational Therapy Evaluation Patient Details Name: Roberta Calderon MRN: [id number] DOB: 1970-12-23 Today's Date: 02/02/2023   History of Present Illness 52 y/o F admitted to [facility] on 6/6 for left sided weakness as well as tingling and numbness and speech difficulty. MRI shows acute ischemic infarct in posterior right frontal centrum semiovale, multiple scattered chronic microhemorrhages and multiple remote lacunar infarcts. PMHx: HTN, DM, multiple strokes, substance abuse in remission   Clinical Impression   PTA, per pt report she was independent in ADL and IADL and mother lived in a home behind her home. Pt likely poor historian; per rehab admissions note, was going to a day program PTA, but pt reporting living at home and independent alone during the day. Upon eval, pt demonstrating decreased LUE coordination, decr balance, strength, safety, awareness, cognition, and emotional lability. Pt requiring up to min A for ADL and safety with RW. Due to significant change in functional status, recommending discharge to AIR to optimzie safety and independence in ADL.      Recommendations for follow up therapy are one component of a multi-disciplinary discharge planning process, led by the attending physician.  Recommendations may be updated based on patient status, additional functional criteria and insurance authorization.   Assistance Recommended at Discharge Frequent or constant Supervision/Assistance  Patient can return home with the following A little help with walking and/or transfers;A little help with bathing/dressing/bathroom;Direct supervision/assist for medications management;Assist for transportation;Direct supervision/assist for financial management;Help with stairs or ramp for entrance;Assistance with cooking/housework    Functional Status Assessment  Patient has had a recent decline in their functional status and demonstrates the ability to make significant improvements in function in a  reasonable and predictable amount of time.  Equipment Recommendations  Other (comment) (will need RW and BSC if home instead of rehab)    Recommendations for Other Services Rehab consult     Precautions / Restrictions Precautions Precautions: Fall Restrictions Weight Bearing Restrictions: No      Mobility Bed Mobility               General bed mobility comments: EOB on arrival and departure    Transfers Overall transfer level: Needs assistance Equipment used: Rolling walker (2 wheels) Transfers: Sit to/from Stand Sit to Stand: Min guard           General transfer comment: min guard A for rise; cueing for safety and hand placement      Balance Overall balance assessment: Needs assistance Sitting-balance support: Bilateral upper extremity supported, Feet supported Sitting balance-Leahy Scale: Fair     Standing balance support: Bilateral upper extremity supported, During functional activity, Reliant on assistive device for balance Standing balance-Leahy Scale: Poor Standing balance comment: reliant on RW                           ADL either performed or assessed with clinical judgement   ADL Overall ADL's : Needs assistance/impaired Eating/Feeding: Sitting;Set up   Grooming: Moderate assistance;Standing Grooming Details (indicate cue type and reason): Reluctant to attempt to use LUE, requiring cues and encouragement Upper Body Bathing: Minimal assistance;Sitting   Lower Body Bathing: Sit to/from stand;Minimal assistance   Upper Body Dressing : Minimal assistance   Lower Body Dressing: Minimal assistance   Toilet Transfer: Minimal assistance;Ambulation;Cueing for sequencing Toilet Transfer Details (indicate cue type and reason): safety cues and for manipulation of RW         Functional mobility during ADLs: Minimal assistance;Moderate assistance;Rolling walker (  2 wheels)       Vision Ability to See in Adequate Light: 0  Adequate Patient Visual Report: No change from baseline Vision Assessment?: No apparent visual deficits     Perception     Praxis      Pertinent Vitals/Pain Pain Assessment Pain Assessment: Faces Faces Pain Scale: No hurt Pain Intervention(s): Monitored during session     Hand Dominance Right   Extremity/Trunk Assessment Upper Extremity Assessment Upper Extremity Assessment: LUE deficits/detail LUE Deficits / Details: 3/5 MMT, however able to squeeze green foam xube LUE Sensation: decreased light touch LUE Coordination: decreased fine motor   Lower Extremity Assessment Lower Extremity Assessment: Defer to PT evaluation   Cervical / Trunk Assessment Cervical / Trunk Assessment: Normal   Communication Communication Communication: No difficulties   Cognition Arousal/Alertness: Awake/alert Behavior During Therapy: Flat affect Overall Cognitive Status: No family/caregiver present to determine baseline cognitive functioning                                 General Comments: A&Ox4, lethargic upon arrival, but more alert once EOB. Pt flat on arrival and other staff reporting she has been agitated, but no agitation demonstrated during OT session. Pt did however demonstrate frustration with desire to shower and be able to go outside. Pt with mild memory deficits, min difficulty requiring mod cues to answer simple financlial management questions. Pt generally aware of deficits. No family present to determine baseline, but pt reports family can assist at home.     General Comments  VSS on RA    Exercises Exercises: Other exercises Other Exercises Other Exercises: squeeze cube 10x L   Shoulder Instructions      Home Living Family/patient expects to be discharged to:: Private residence Living Arrangements: Alone Available Help at Discharge: Family;Available 24 hours/day Type of Home: House Home Access: Level entry     Home Layout: One level     Bathroom  Shower/Tub: Chief Strategy Officer: Standard Bathroom Accessibility: Yes   Home Equipment: None   Additional Comments: lives in house behind mom's house      Prior Functioning/Environment Prior Level of Function : Independent/Modified Independent             Mobility Comments: independent with ambulation, no AD, no falls ADLs Comments: independent per pt report, doesn't drive        OT Problem List: Decreased strength;Decreased activity tolerance;Impaired balance (sitting and/or standing);Decreased coordination;Decreased cognition;Decreased safety awareness;Decreased knowledge of use of DME or AE;Impaired UE functional use      OT Treatment/Interventions: Self-care/ADL training;Therapeutic exercise;DME and/or AE instruction;Balance training;Patient/family education;Therapeutic activities;Cognitive remediation/compensation    OT Goals(Current goals can be found in the care plan section) Acute Rehab OT Goals Patient Stated Goal: be able to fasten my bra OT Goal Formulation: With patient Time For Goal Achievement: 02/16/23 Potential to Achieve Goals: Good  OT Frequency: Min 2X/week    Co-evaluation              AM-PAC OT "6 Clicks" Daily Activity     Outcome Measure Help from another person eating meals?: None Help from another person taking care of personal grooming?: A Little Help from another person toileting, which includes using toliet, bedpan, or urinal?: A Little Help from another person bathing (including washing, rinsing, drying)?: A Little Help from another person to put on and taking off regular upper body clothing?: A Little Help from another  person to put on and taking off regular lower body clothing?: A Little 6 Click Score: 19   End of Session Equipment Utilized During Treatment: Gait belt;Rolling walker (2 wheels) Nurse Communication: Mobility status  Activity Tolerance: Patient tolerated treatment well Patient left: in bed;with call  bell/phone within reach;with bed alarm set  OT Visit Diagnosis: Unsteadiness on feet (R26.81);Muscle weakness (generalized) (M62.81);Other symptoms and signs involving cognitive function                Time: 1610-9604 OT Time Calculation (min): 27 min Charges:  OT General Charges $OT Visit: 1 Visit OT Evaluation $OT Eval Moderate Complexity: 1 Mod OT Treatments $Self Care/Home Management : 8-22 mins  Tyler Deis, OTR/L [facility] Acute Rehabilitation Office: [phone]   Roberta Calderon 02/02/2023, 1:37 PM

## 2023-02-03 DIAGNOSIS — I63511 Cerebral infarction due to unspecified occlusion or stenosis of right middle cerebral artery: Secondary | ICD-10-CM | POA: Diagnosis not present

## 2023-02-03 DIAGNOSIS — G8194 Hemiplegia, unspecified affecting left nondominant side: Secondary | ICD-10-CM | POA: Diagnosis present

## 2023-02-03 DIAGNOSIS — E119 Type 2 diabetes mellitus without complications: Secondary | ICD-10-CM | POA: Diagnosis present

## 2023-02-03 DIAGNOSIS — I69354 Hemiplegia and hemiparesis following cerebral infarction affecting left non-dominant side: Secondary | ICD-10-CM | POA: Diagnosis not present

## 2023-02-03 DIAGNOSIS — R29810 Facial weakness: Secondary | ICD-10-CM | POA: Diagnosis present

## 2023-02-03 DIAGNOSIS — Z886 Allergy status to analgesic agent status: Secondary | ICD-10-CM | POA: Diagnosis not present

## 2023-02-03 DIAGNOSIS — F17210 Nicotine dependence, cigarettes, uncomplicated: Secondary | ICD-10-CM | POA: Diagnosis present

## 2023-02-03 DIAGNOSIS — I69351 Hemiplegia and hemiparesis following cerebral infarction affecting right dominant side: Secondary | ICD-10-CM | POA: Diagnosis not present

## 2023-02-03 DIAGNOSIS — E876 Hypokalemia: Secondary | ICD-10-CM | POA: Diagnosis present

## 2023-02-03 DIAGNOSIS — F32A Depression, unspecified: Secondary | ICD-10-CM | POA: Diagnosis present

## 2023-02-03 DIAGNOSIS — R531 Weakness: Secondary | ICD-10-CM | POA: Diagnosis not present

## 2023-02-03 DIAGNOSIS — Z91148 Patient's other noncompliance with medication regimen for other reason: Secondary | ICD-10-CM | POA: Diagnosis not present

## 2023-02-03 DIAGNOSIS — Z7984 Long term (current) use of oral hypoglycemic drugs: Secondary | ICD-10-CM | POA: Diagnosis not present

## 2023-02-03 DIAGNOSIS — I6381 Other cerebral infarction due to occlusion or stenosis of small artery: Secondary | ICD-10-CM | POA: Diagnosis present

## 2023-02-03 DIAGNOSIS — I10 Essential (primary) hypertension: Secondary | ICD-10-CM | POA: Diagnosis present

## 2023-02-03 DIAGNOSIS — Z79899 Other long term (current) drug therapy: Secondary | ICD-10-CM | POA: Diagnosis not present

## 2023-02-03 DIAGNOSIS — E785 Hyperlipidemia, unspecified: Secondary | ICD-10-CM | POA: Diagnosis present

## 2023-02-03 DIAGNOSIS — I639 Cerebral infarction, unspecified: Secondary | ICD-10-CM | POA: Diagnosis not present

## 2023-02-03 LAB — GLUCOSE, CAPILLARY
Glucose-Capillary: 115 mg/dL — ABNORMAL HIGH (ref 70–99)
Glucose-Capillary: 135 mg/dL — ABNORMAL HIGH (ref 70–99)
Glucose-Capillary: 100 mg/dL — ABNORMAL HIGH (ref 70–99)
Glucose-Capillary: 120 mg/dL — ABNORMAL HIGH (ref 70–99)

## 2023-02-03 NOTE — Progress Notes (Signed)
PROGRESS NOTE    Roberta Calderon  YNW:[id number] DOB: [birth year] DOA: 01/31/2023 PCP: Grayce Sessions, NP   Brief Narrative:  This is a 52 year old lady with past medical history of hypertension, type 2 diabetes mellitus and multiple strokes as well as history of substance abuse and currently nicotine dependence came in with left-sided weakness and speech difficulty and admitted for new acute ischemic stroke. Seen by neurology.  She has been started on Plavix (she is not on aspirin due to allergy).  She is also on atorvastatin.  She has hyperlipidemia.  CTA head and neck negative for large vessel obstruction but she does have atherosclerosis in the head and neck without flow-limiting stenosis.  Seen by PT OT who recommended CIR.  Patient stable waiting for placement to CIR.  Assessment & Plan:   Principal Problem:   Acute CVA (cerebrovascular accident) [facility]) Active Problems:   Hypertension   Dyslipidemia   Non-insulin dependent type 2 diabetes mellitus (HCC)  Acute ischemic CVA/hypertension-hyperlipidemia: Left upper and lower extremity weakness gradually improving.  Continue Plavix, atorvastatin.  Blood pressure controlled and in fact on the lower side today so we will hold antihypertensives today.  PT OT recommended CIR.  Pending CIR placement.  Patient agreeable.  Continue current antihypertensives.  Type 2 diabetes mellitus: Hemoglobin A1c 6.5.  Continue SSI.  DVT prophylaxis: enoxaparin (LOVENOX) injection 40 mg Start: 02/01/23 1000   Code Status: Full Code  Family Communication:  None present at bedside.  Plan of care discussed with patient in length and he/she verbalized understanding and agreed with it.  Status is: Observation The patient will require care spanning > 2 midnights and should be moved to inpatient because: Patient medically stable but needs placement to CIR   Estimated body mass index is 26.09 kg/m as calculated from the following:   Height as of this  encounter: 5\' 3"  (1.6 m).   Weight as of this encounter: 66.8 kg.    Nutritional Assessment: Body mass index is 26.09 kg/m.Marland Kitchen Seen by dietician.  I agree with the assessment and plan as outlined below: Nutrition Status:        . Skin Assessment: I have examined the patient's skin and I agree with the wound assessment as performed by the wound care RN as outlined below:    Consultants:  Neurology-signed off  Procedures:  None  Antimicrobials:  Anti-infectives (From admission, onward)    None         Subjective: Patient seen and examined today.  No complaints.  Yesterday, she was upset and desired to leave AGAINST MEDICAL ADVICE.  I spoke to her after that.  She told me that her mother received a phone call from [facility] coordinator and her mother was told that patient had blood clot in her head which was retrieved with a catheter.  This made the patient very upset and caused loss of trust in the medical team.  Patient said " you guys do not know what you are doing and that is why I want to leave".  I reassured her and then I spoke to her mother in detail.  Her mother explained the whole procedure of thrombectomy basically to me and said that she was informed by CIR coordinator.  I apologized to her and explained to her that patient has not had any procedure done and that she is on Plavix and antihypertensives and antilipids.  Her mother was very grateful for explanation and she called the patient and convinced her to stay.  Patient is now agreeable to stay until we can arrange CIR for her.  Objective: Vitals:   02/02/23 1958 02/02/23 2357 02/03/23 0325 02/03/23 0714  BP: 106/74 98/63 109/77 99/76  Pulse: 79 85 91 96  Resp:  19 19 18   Temp: 98.9 F (37.2 C) 99.1 F (37.3 C) 98.3 F (36.8 C)   TempSrc: Oral Oral Oral   SpO2: 100% 98% 95% (!) 77%  Weight:      Height:        Intake/Output Summary (Last 24 hours) at 02/03/2023 0928 Last data filed at 02/02/2023 2050 Gross per  24 hour  Intake 120 ml  Output --  Net 120 ml    Filed Weights   02/01/23 1240  Weight: 66.8 kg    Examination:  General exam: Appears calm and comfortable  Respiratory system: Clear to auscultation. Respiratory effort normal. Cardiovascular system: S1 & S2 heard, RRR. No JVD, murmurs, rubs, gallops or clicks. No pedal edema. Gastrointestinal system: Abdomen is nondistended, soft and nontender. No organomegaly or masses felt. Normal bowel sounds heard. Central nervous system: Alert and oriented.  4/5 power left upper and lower extremity.  Overall strength improving. Extremities: Symmetric 5 x 5 power. Skin: No rashes, lesions or ulcers.  Psychiatry: Judgement and insight appear normal. Mood & affect appropriate.   Data Reviewed: I have personally reviewed following labs and imaging studies  CBC: Recent Labs  Lab 01/31/23 1650 02/01/23 0449 02/02/23 0332  WBC 7.1 5.8 5.1  NEUTROABS 3.1  --   --   HGB 13.4 12.7 13.6  HCT 43.0 41.3 42.2  MCV 78.2* 77.1* 76.6*  PLT 339 326 326    Basic Metabolic Panel: Recent Labs  Lab 01/31/23 1650 02/01/23 0449 02/02/23 0332  NA 137 135 135  K 3.7 3.4* 3.8  CL 98 101 103  CO2 22 22 21*  GLUCOSE 122* 109* 129*  BUN 11 10 16   CREATININE 0.72 0.68 0.85  CALCIUM 9.8 9.1 9.3    GFR: Estimated Creatinine Clearance: 71.1 mL/min (by C-G formula based on SCr of 0.85 mg/dL). Liver Function Tests: Recent Labs  Lab 02/01/23 0449 02/02/23 0332  AST 19 19  ALT 13 15  ALKPHOS 87 101  BILITOT 0.6 0.6  PROT 6.7 6.8  ALBUMIN 3.6 3.6    No results for input(s): "LIPASE", "AMYLASE" in the last 168 hours. No results for input(s): "AMMONIA" in the last 168 hours. Coagulation Profile: Recent Labs  Lab 02/01/23 0147  INR 1.0    Cardiac Enzymes: No results for input(s): "CKTOTAL", "CKMB", "CKMBINDEX", "TROPONINI" in the last 168 hours. BNP (last 3 results) No results for input(s): "PROBNP" in the last 8760 hours. HbA1C: Recent  Labs    02/01/23 0500  HGBA1C 6.5*    CBG: Recent Labs  Lab 02/02/23 0631 02/02/23 1225 02/02/23 1633 02/02/23 1959 02/03/23 0656  GLUCAP 145* 134* 136* 163* 115*    Lipid Profile: Recent Labs    02/01/23 0449  CHOL 220*  HDL 49  LDLCALC 153*  TRIG 90  CHOLHDL 4.5    Thyroid Function Tests: No results for input(s): "TSH", "T4TOTAL", "FREET4", "T3FREE", "THYROIDAB" in the last 72 hours. Anemia Panel: No results for input(s): "VITAMINB12", "FOLATE", "FERRITIN", "TIBC", "IRON", "RETICCTPCT" in the last 72 hours. Sepsis Labs: No results for input(s): "PROCALCITON", "LATICACIDVEN" in the last 168 hours.  No results found for this or any previous visit (from the past 240 hour(s)).   Radiology Studies: ECHOCARDIOGRAM COMPLETE  Result Date: 02/01/2023  ECHOCARDIOGRAM REPORT   Patient Name:   Roberta Calderon Date of Exam: 02/01/2023 Medical Rec #:  [id number]          Height:       63.0 in Accession #:    [id number]         Weight:       147.3 lb Date of Birth:  1970-12-10           BSA:          1.698 m Patient Age:    52 years           BP:           134/84 mmHg Patient Gender: F                  HR:           68 bpm. Exam Location:  Inpatient Procedure: Cardiac Doppler, Color Doppler and 2D Echo Indications:    stroke  History:        Patient has prior history of Echocardiogram examinations, most                 recent 02/05/2022. Risk Factors:Hypertension, Dyslipidemia,                 Current Smoker and polysubstance abuse.  Sonographer:    Delcie Roch RDCS Referring Phys: [id number] TIMOTHY S OPYD IMPRESSIONS  1. Left ventricular ejection fraction, by estimation, is 60 to 65%. The left ventricle has normal function. The left ventricle has no regional wall motion abnormalities. There is moderate concentric left ventricular hypertrophy of the septal segment. Left ventricular diastolic parameters were normal.  2. Right ventricular systolic function is normal. The right ventricular  size is normal.  3. The mitral valve is normal in structure. No evidence of mitral valve regurgitation. No evidence of mitral stenosis.  4. The aortic valve is normal in structure. There is mild calcification of the aortic valve. Aortic valve regurgitation is not visualized. No aortic stenosis is present.  5. The inferior vena cava is normal in size with greater than 50% respiratory variability, suggesting right atrial pressure of 3 mmHg. FINDINGS  Left Ventricle: Left ventricular ejection fraction, by estimation, is 60 to 65%. The left ventricle has normal function. The left ventricle has no regional wall motion abnormalities. The left ventricular internal cavity size was normal in size. There is  moderate concentric left ventricular hypertrophy of the septal segment. Left ventricular diastolic parameters were normal. Right Ventricle: The right ventricular size is normal. No increase in right ventricular wall thickness. Right ventricular systolic function is normal. Left Atrium: Left atrial size was normal in size. Right Atrium: Right atrial size was normal in size. Pericardium: There is no evidence of pericardial effusion. Mitral Valve: The mitral valve is normal in structure. No evidence of mitral valve regurgitation. No evidence of mitral valve stenosis. Tricuspid Valve: The tricuspid valve is normal in structure. Tricuspid valve regurgitation is not demonstrated. No evidence of tricuspid stenosis. Aortic Valve: The aortic valve is normal in structure. There is mild calcification of the aortic valve. Aortic valve regurgitation is not visualized. No aortic stenosis is present. Pulmonic Valve: The pulmonic valve was normal in structure. Pulmonic valve regurgitation is not visualized. No evidence of pulmonic stenosis. Aorta: The aortic root is normal in size and structure. Venous: The inferior vena cava is normal in size with greater than 50% respiratory variability, suggesting right atrial pressure of 3 mmHg.  IAS/Shunts: No  atrial level shunt detected by color flow Doppler.  LEFT VENTRICLE PLAX 2D LVIDd:         3.00 cm Diastology LVIDs:         1.90 cm LV e' medial:    10.40 cm/s LV PW:         1.10 cm LV E/e' medial:  5.4 LV IVS:        1.20 cm LV e' lateral:   12.30 cm/s                        LV E/e' lateral: 4.6  RIGHT VENTRICLE         IVC TAPSE (M-mode): 2.4 cm  IVC diam: 1.30 cm LEFT ATRIUM           Index        RIGHT ATRIUM           Index LA diam:      2.70 cm 1.59 cm/m   RA Area:     10.10 cm LA Vol (A4C): 27.2 ml 16.02 ml/m  RA Volume:   19.30 ml  11.37 ml/m  AORTIC VALVE LVOT Vmax:   110.00 cm/s LVOT Vmean:  68.800 cm/s LVOT VTI:    0.193 m  AORTA Ao Asc diam: 2.80 cm MITRAL VALVE MV Area (PHT): 3.53 cm    SHUNTS MV Decel Time: 215 msec    Systemic VTI: 0.19 m MV E velocity: 56.00 cm/s MV A velocity: 85.40 cm/s MV E/A ratio:  0.66 Aditya Sabharwal Electronically signed by Dorthula Nettles Signature Date/Time: 02/01/2023/4:39:22 PM    Final     Scheduled Meds:  atorvastatin  80 mg Oral Daily   clopidogrel  75 mg Oral Daily   enoxaparin (LOVENOX) injection  40 mg Subcutaneous Q24H   hydrochlorothiazide  25 mg Oral Daily   insulin aspart  0-5 Units Subcutaneous QHS   insulin aspart  0-6 Units Subcutaneous TID WC   losartan  100 mg Oral Daily   nicotine  7 mg Transdermal Daily   traZODone  25-50 mg Oral QHS   Continuous Infusions:   LOS: 0 days   Hughie Closs, MD [facility]  02/03/2023, 9:28 AM   *Please note that this is a verbal dictation therefore any spelling or grammatical errors are due to the "Dragon Medical One" system interpretation.  Please page via Amion and do not message via secure chat for urgent patient care matters. Secure chat can be used for non urgent patient care matters.  How to contact the [facility] Attending or Consulting provider 7A - 7P or covering provider during after hours 7P -7A, for this patient?  Check the care team in [facility] and look for a)  attending/consulting [facility] provider listed and b) the [facility] team listed. Page or secure chat 7A-7P. Log into [email or web address] and use [place]'s universal password to access. If you do not have the password, please contact the hospital operator. Locate the [facility] provider you are looking for under [facility] and page to a number that you can be directly reached. If you still have difficulty reaching the provider, please page the [place] (Director on Call) for the Hospitalists listed on amion for assistance.

## 2023-02-04 ENCOUNTER — Inpatient Hospital Stay (HOSPITAL_COMMUNITY)
Admission: RE | Admit: 2023-02-04 | Discharge: 2023-02-06 | DRG: 057 | Disposition: A | Discharge status: Home / Self Care | Payer: Medicaid Other | Source: Intra-hospital | Attending: Physical Medicine & Rehabilitation | Admitting: Physical Medicine & Rehabilitation

## 2023-02-04 ENCOUNTER — Telehealth (INDEPENDENT_AMBULATORY_CARE_PROVIDER_SITE_OTHER): Payer: Self-pay | Admitting: Licensed Clinical Social Worker

## 2023-02-04 ENCOUNTER — Encounter (HOSPITAL_COMMUNITY): Payer: Self-pay | Admitting: Physical Medicine & Rehabilitation

## 2023-02-04 ENCOUNTER — Other Ambulatory Visit: Payer: Self-pay

## 2023-02-04 DIAGNOSIS — R278 Other lack of coordination: Secondary | ICD-10-CM | POA: Diagnosis present

## 2023-02-04 DIAGNOSIS — I69354 Hemiplegia and hemiparesis following cerebral infarction affecting left non-dominant side: Secondary | ICD-10-CM | POA: Diagnosis present

## 2023-02-04 DIAGNOSIS — Z7902 Long term (current) use of antithrombotics/antiplatelets: Secondary | ICD-10-CM | POA: Diagnosis not present

## 2023-02-04 DIAGNOSIS — Z886 Allergy status to analgesic agent status: Secondary | ICD-10-CM

## 2023-02-04 DIAGNOSIS — I69328 Other speech and language deficits following cerebral infarction: Secondary | ICD-10-CM

## 2023-02-04 DIAGNOSIS — F17210 Nicotine dependence, cigarettes, uncomplicated: Secondary | ICD-10-CM | POA: Diagnosis present

## 2023-02-04 DIAGNOSIS — I639 Cerebral infarction, unspecified: Secondary | ICD-10-CM | POA: Diagnosis not present

## 2023-02-04 DIAGNOSIS — I10 Essential (primary) hypertension: Secondary | ICD-10-CM | POA: Diagnosis present

## 2023-02-04 DIAGNOSIS — E785 Hyperlipidemia, unspecified: Secondary | ICD-10-CM | POA: Diagnosis present

## 2023-02-04 DIAGNOSIS — E119 Type 2 diabetes mellitus without complications: Secondary | ICD-10-CM | POA: Diagnosis present

## 2023-02-04 DIAGNOSIS — G4700 Insomnia, unspecified: Secondary | ICD-10-CM | POA: Diagnosis present

## 2023-02-04 DIAGNOSIS — Z8673 Personal history of transient ischemic attack (TIA), and cerebral infarction without residual deficits: Secondary | ICD-10-CM | POA: Diagnosis not present

## 2023-02-04 DIAGNOSIS — Z79899 Other long term (current) drug therapy: Secondary | ICD-10-CM

## 2023-02-04 DIAGNOSIS — I63511 Cerebral infarction due to unspecified occlusion or stenosis of right middle cerebral artery: Secondary | ICD-10-CM | POA: Diagnosis not present

## 2023-02-04 DIAGNOSIS — F418 Other specified anxiety disorders: Secondary | ICD-10-CM

## 2023-02-04 LAB — URINALYSIS, ROUTINE W REFLEX MICROSCOPIC
Bilirubin Urine: NEGATIVE
Glucose, UA: NEGATIVE mg/dL
Hgb urine dipstick: NEGATIVE
Ketones, ur: NEGATIVE mg/dL
Nitrite: NEGATIVE
Protein, ur: NEGATIVE mg/dL
Specific Gravity, Urine: 1.019 (ref 1.005–1.030)
pH: 5 (ref 5.0–8.0)

## 2023-02-04 LAB — GLUCOSE, CAPILLARY
Glucose-Capillary: 121 mg/dL — ABNORMAL HIGH (ref 70–99)
Glucose-Capillary: 129 mg/dL — ABNORMAL HIGH (ref 70–99)
Glucose-Capillary: 129 mg/dL — ABNORMAL HIGH (ref 70–99)

## 2023-02-04 LAB — CBC
HCT: 43.2 % (ref 36.0–46.0)
Hemoglobin: 13.1 g/dL (ref 12.0–15.0)
MCH: 23.6 pg — ABNORMAL LOW (ref 26.0–34.0)
MCHC: 30.3 g/dL (ref 30.0–36.0)
MCV: 77.8 fL — ABNORMAL LOW (ref 80.0–100.0)
Platelets: 342 10*3/uL (ref 150–400)
RBC: 5.55 MIL/uL — ABNORMAL HIGH (ref 3.87–5.11)
RDW: 13.9 % (ref 11.5–15.5)
WBC: 6.3 10*3/uL (ref 4.0–10.5)
nRBC: 0 % (ref 0.0–0.2)

## 2023-02-04 LAB — CREATININE, SERUM
Creatinine, Ser: 0.98 mg/dL (ref 0.44–1.00)
GFR, Estimated: >60 mL/min (ref >60)

## 2023-02-04 MED ORDER — NICOTINE 7 MG/24HR TD PT24
7.0000 mg | MEDICATED_PATCH | Freq: Every day | TRANSDERMAL | Status: DC
  Administered 2023-02-05 – 2023-02-06 (×2): 7 mg via TRANSDERMAL
  Filled 2023-02-04 (×2): qty 1

## 2023-02-04 MED ORDER — ACETAMINOPHEN 650 MG RE SUPP: 650.0000 mg | RECTAL | Status: DC | PRN

## 2023-02-04 MED ORDER — LOSARTAN POTASSIUM 50 MG PO TABS
50.0000 mg | ORAL_TABLET | Freq: Every day | ORAL | Status: DC
  Administered 2023-02-05 – 2023-02-06 (×2): 50 mg via ORAL
  Filled 2023-02-04 (×2): qty 1

## 2023-02-04 MED ORDER — INSULIN ASPART 100 UNIT/ML IJ SOLN: 0.0000 [IU] | Freq: Three times a day (TID) | INTRAMUSCULAR | Status: DC

## 2023-02-04 MED ORDER — TRAZODONE HCL 50 MG PO TABS
25.0000 mg | ORAL_TABLET | Freq: Every day | ORAL | Status: DC
  Administered 2023-02-04 – 2023-02-05 (×2): 50 mg via ORAL
  Filled 2023-02-04 (×2): qty 1

## 2023-02-04 MED ORDER — LOSARTAN POTASSIUM 50 MG PO TABS
50.0000 mg | ORAL_TABLET | Freq: Every day | ORAL | Status: DC
  Administered 2023-02-04: 50 mg via ORAL
  Filled 2023-02-04: qty 1

## 2023-02-04 MED ORDER — SENNOSIDES-DOCUSATE SODIUM 8.6-50 MG PO TABS: 1.0000 | ORAL_TABLET | Freq: Every evening | ORAL | Status: DC | PRN

## 2023-02-04 MED ORDER — CLOPIDOGREL BISULFATE 75 MG PO TABS
75.0000 mg | ORAL_TABLET | Freq: Every day | ORAL | Status: DC
  Administered 2023-02-05 – 2023-02-06 (×2): 75 mg via ORAL
  Filled 2023-02-04 (×2): qty 1

## 2023-02-04 MED ORDER — ENOXAPARIN SODIUM 40 MG/0.4ML IJ SOSY
40.0000 mg | PREFILLED_SYRINGE | INTRAMUSCULAR | Status: DC
  Administered 2023-02-05: 40 mg via SUBCUTANEOUS
  Filled 2023-02-04: qty 0.4

## 2023-02-04 MED ORDER — ACETAMINOPHEN 325 MG PO TABS: 650.0000 mg | ORAL_TABLET | ORAL | Status: DC | PRN

## 2023-02-04 MED ORDER — ATORVASTATIN CALCIUM 80 MG PO TABS
80.0000 mg | ORAL_TABLET | Freq: Every day | ORAL | Status: DC
  Administered 2023-02-05 – 2023-02-06 (×2): 80 mg via ORAL
  Filled 2023-02-04 (×2): qty 1

## 2023-02-04 MED ORDER — ACETAMINOPHEN 160 MG/5ML PO SOLN: 650.0000 mg | ORAL | Status: DC | PRN

## 2023-02-04 NOTE — Discharge Summary (Signed)
Physician Discharge Summary  ASTIN WAGGONER ZOX:[id number] DOB: [birth year] DOA: 01/31/2023  PCP: Grayce Sessions, NP  Admit date: 01/31/2023 Discharge date: 02/04/2023 30 Day Unplanned Readmission Risk Score    Flowsheet Row ED to Hosp-Admission (Current) from 01/31/2023 in [facility]  30 Day Unplanned Readmission Risk Score (%) 12.23 Filed at 02/04/2023 0801       This score is the patient's risk of an unplanned readmission within 30 days of being discharged (0 -100%). The score is based on dignosis, age, lab data, medications, orders, and past utilization.   Low:  0-14.9   Medium: 15-21.9   High: 22-29.9   Extreme: 30 and above          Admitted From: Home Disposition: CIR  Recommendations for Outpatient Follow-up:  Follow up with PCP in 1-2 weeks Please obtain BMP/CBC in one week Please follow up with your PCP on the following pending results: Unresulted Labs (From admission, onward)     Start     Ordered   02/08/23 0500  Creatinine, serum  (enoxaparin (LOVENOX)    CrCl >/= 30 ml/min)  Weekly,   R     Comments: while on enoxaparin therapy    02/01/23 0434   02/02/23 1327  C Difficile Quick Screen w PCR reflex  (C Difficile quick screen w PCR reflex panel )  Once, for 24 hours,   TIMED       References:    CDiff Information Tool   02/02/23 1326   02/01/23 0430  Rapid urine drug screen (hospital performed)  ONCE - STAT,   STAT        02/01/23 0429              Home Health: None Equipment/Devices: None  Discharge Condition: Stable CODE STATUS: Full code Diet recommendation: Cardiac  Subjective: Seen and examined.  No complaints.  Left upper and lower extremity strength improving.  Brief/Interim Summary: This is a 52 year old lady with past medical history of hypertension, type 2 diabetes mellitus and multiple strokes as well as history of substance abuse and currently nicotine dependence came in with left-sided weakness and speech  difficulty and admitted for new acute ischemic stroke. Seen by neurology.  She has been started on Plavix (she is not on aspirin due to allergy).  She is also on atorvastatin.  She has hyperlipidemia.  CTA head and neck negative for large vessel obstruction but she does have atherosclerosis in the head and neck without flow-limiting stenosis.  Seen by PT OT who recommended CIR. patient's blood pressure initially was elevated, she was supposed to be on Cozaar 50 mg which was increased to 100 mg and hydrochlorothiazide was added but her blood pressure is now low normal so we have decided to discharge her on her previous Cozaar dose of 50 mg (although reportedly she was noncompliant with all medications).  Echo was unremarkable.  She was seen by neurology who has signed off and cleared her for discharge few days ago.   Type 2 diabetes mellitus: Hemoglobin A1c 6.5.    Discharge plan was discussed with patient and/or family member and they verbalized understanding and agreed with it.  Discharge Diagnoses:  Principal Problem:   Acute CVA (cerebrovascular accident) [facility]) Active Problems:   Hypertension   Dyslipidemia   Non-insulin dependent type 2 diabetes mellitus (HCC)    Discharge Instructions   Allergies as of 02/04/2023       Reactions   Nsaids Nausea And Vomiting,  Rash   Aspirin Hives        Medication List     STOP taking these medications    hydrochlorothiazide 25 MG tablet Commonly known as: HYDRODIURIL       TAKE these medications    atorvastatin 80 MG tablet Commonly known as: LIPITOR Take 1 tablet (80 mg total) by mouth daily. What changed:  medication strength how much to take   clopidogrel 75 MG tablet Commonly known as: PLAVIX Take 1 tablet (75 mg total) by mouth daily.   losartan 50 MG tablet Commonly known as: COZAAR Take 1 tablet (50 mg total) by mouth once daily.        Follow-up Information     Grayce Sessions, NP Follow up in 1 week(s).    Specialty: Internal Medicine Contact information: [address] [place] [address] [phone]                Allergies  Allergen Reactions   Nsaids Nausea And Vomiting and Rash   Aspirin Hives    Consultations: Neurology   Procedures/Studies: ECHOCARDIOGRAM COMPLETE  Result Date: 02/01/2023    ECHOCARDIOGRAM REPORT   Patient Name:   LENYX BOODY Date of Exam: 02/01/2023 Medical Rec #:  [id number]          Height:       63.0 in Accession #:    [id number]         Weight:       147.3 lb Date of Birth:  Nov 18, 1970           BSA:          1.698 m Patient Age:    52 years           BP:           134/84 mmHg Patient Gender: F                  HR:           68 bpm. Exam Location:  Inpatient Procedure: Cardiac Doppler, Color Doppler and 2D Echo Indications:    stroke  History:        Patient has prior history of Echocardiogram examinations, most                 recent 02/05/2022. Risk Factors:Hypertension, Dyslipidemia,                 Current Smoker and polysubstance abuse.  Sonographer:    Delcie Roch RDCS Referring Phys: [id number] TIMOTHY S OPYD IMPRESSIONS  1. Left ventricular ejection fraction, by estimation, is 60 to 65%. The left ventricle has normal function. The left ventricle has no regional wall motion abnormalities. There is moderate concentric left ventricular hypertrophy of the septal segment. Left ventricular diastolic parameters were normal.  2. Right ventricular systolic function is normal. The right ventricular size is normal.  3. The mitral valve is normal in structure. No evidence of mitral valve regurgitation. No evidence of mitral stenosis.  4. The aortic valve is normal in structure. There is mild calcification of the aortic valve. Aortic valve regurgitation is not visualized. No aortic stenosis is present.  5. The inferior vena cava is normal in size with greater than 50% respiratory variability, suggesting right atrial pressure of 3 mmHg. FINDINGS  Left  Ventricle: Left ventricular ejection fraction, by estimation, is 60 to 65%. The left ventricle has normal function. The left ventricle has no regional wall motion abnormalities. The left ventricular internal  cavity size was normal in size. There is  moderate concentric left ventricular hypertrophy of the septal segment. Left ventricular diastolic parameters were normal. Right Ventricle: The right ventricular size is normal. No increase in right ventricular wall thickness. Right ventricular systolic function is normal. Left Atrium: Left atrial size was normal in size. Right Atrium: Right atrial size was normal in size. Pericardium: There is no evidence of pericardial effusion. Mitral Valve: The mitral valve is normal in structure. No evidence of mitral valve regurgitation. No evidence of mitral valve stenosis. Tricuspid Valve: The tricuspid valve is normal in structure. Tricuspid valve regurgitation is not demonstrated. No evidence of tricuspid stenosis. Aortic Valve: The aortic valve is normal in structure. There is mild calcification of the aortic valve. Aortic valve regurgitation is not visualized. No aortic stenosis is present. Pulmonic Valve: The pulmonic valve was normal in structure. Pulmonic valve regurgitation is not visualized. No evidence of pulmonic stenosis. Aorta: The aortic root is normal in size and structure. Venous: The inferior vena cava is normal in size with greater than 50% respiratory variability, suggesting right atrial pressure of 3 mmHg. IAS/Shunts: No atrial level shunt detected by color flow Doppler.  LEFT VENTRICLE PLAX 2D LVIDd:         3.00 cm Diastology LVIDs:         1.90 cm LV e' medial:    10.40 cm/s LV PW:         1.10 cm LV E/e' medial:  5.4 LV IVS:        1.20 cm LV e' lateral:   12.30 cm/s                        LV E/e' lateral: 4.6  RIGHT VENTRICLE         IVC TAPSE (M-mode): 2.4 cm  IVC diam: 1.30 cm LEFT ATRIUM           Index        RIGHT ATRIUM           Index LA diam:       2.70 cm 1.59 cm/m   RA Area:     10.10 cm LA Vol (A4C): 27.2 ml 16.02 ml/m  RA Volume:   19.30 ml  11.37 ml/m  AORTIC VALVE LVOT Vmax:   110.00 cm/s LVOT Vmean:  68.800 cm/s LVOT VTI:    0.193 m  AORTA Ao Asc diam: 2.80 cm MITRAL VALVE MV Area (PHT): 3.53 cm    SHUNTS MV Decel Time: 215 msec    Systemic VTI: 0.19 m MV E velocity: 56.00 cm/s MV A velocity: 85.40 cm/s MV E/A ratio:  0.66 Aditya Sabharwal Electronically signed by Dorthula Nettles Signature Date/Time: 02/01/2023/4:39:22 PM    Final    CT ANGIO HEAD NECK W WO CM  Result Date: 02/01/2023 CLINICAL DATA:  Left-sided weakness and slurred speech EXAM: CT ANGIOGRAPHY HEAD AND NECK WITH AND WITHOUT CONTRAST TECHNIQUE: Multidetector CT imaging of the head and neck was performed using the standard protocol during bolus administration of intravenous contrast. Multiplanar CT image reconstructions and MIPs were obtained to evaluate the vascular anatomy. Carotid stenosis measurements (when applicable) are obtained utilizing NASCET criteria, using the distal internal carotid diameter as the denominator. RADIATION DOSE REDUCTION: This exam was performed according to the departmental dose-optimization program which includes automated exposure control, adjustment of the mA and/or kV according to patient size and/or use of iterative reconstruction technique. CONTRAST:  75mL OMNIPAQUE IOHEXOL 350 MG/ML  SOLN COMPARISON:  Brain MRI from earlier today FINDINGS: CTA NECK FINDINGS Aortic arch: Normal with 3 vessel branching Right carotid system: Mixed density plaque at the bulb. No significant stenosis and no ulceration. Left carotid system: Extensively plaque at the bifurcation. No significant stenosis and no ulceration. Vertebral arteries: No proximal subclavian or vertebral stenosis. Skeleton: Advanced cervical spine degeneration Other neck: Negative Upper chest: No acute finding Review of the MIP images confirms the above findings CTA HEAD FINDINGS Anterior  circulation: Atheromatous calcification of the carotid siphons. No branch occlusion, beading, or aneurysm. No flow limiting stenosis. Posterior circulation: No branch occlusion, beading, or aneurysm. No flow limiting stenosis. Venous sinuses: Negative for the arterial phase Anatomic variants: None significant Review of the MIP images confirms the above findings IMPRESSION: No emergent vascular finding. Atherosclerosis in the head and neck without flow limiting stenosis or ulceration of major vessels. Electronically Signed   By: Tiburcio Pea M.D.   On: 02/01/2023 04:13   MR BRAIN WO CONTRAST  Result Date: 02/01/2023 CLINICAL DATA:  Initial evaluation for neuro deficit, stroke. EXAM: MRI HEAD WITHOUT CONTRAST TECHNIQUE: Multiplanar, multiecho pulse sequences of the brain and surrounding structures were obtained without intravenous contrast. COMPARISON:  Prior CT from 01/31/2023. FINDINGS: Brain: Cerebral volume within normal limits. Patchy T2/FLAIR hyperintensity involving the periventricular deep white matter both cerebral hemispheres as well as the pons, most consistent with chronic small vessel ischemic disease, moderate to advanced for age. Multiple scattered superimposed remote lacunar infarcts present about the bilateral basal ganglia, thalami, pons, and cerebellum. Multiple scattered chronic micro hemorrhages seen about the cerebellum, brainstem, and left greater than right deep gray nuclei, most characteristic of chronic poorly controlled hypertension. 9 mm acute ischemic nonhemorrhagic infarct present at the posterior right frontal centrum semi ovale (series 5, image 90). No associated mass effect. No other evidence for acute or subacute ischemia. No acute intracranial hemorrhage. No mass lesion, midline shift or mass effect. No hydrocephalus or extra-axial fluid collection. Pituitary gland suprasellar region within normal limits. Vascular: Major intracranial vascular flow voids are maintained. Skull  and upper cervical spine: Craniocervical junction within normal limits. Bone marrow signal intensity normal. No scalp soft tissue abnormality. Sinuses/Orbits: Globes and orbital soft tissues within normal limits. Paranasal sinuses are largely clear. No significant mastoid effusion. Other: None. IMPRESSION: 1. 9 mm acute ischemic nonhemorrhagic infarct involving the posterior right frontal centrum semi ovale. 2. Underlying moderate to advanced chronic microvascular ischemic disease with multiple remote lacunar infarcts involving the bilateral basal ganglia, thalami, pons, and cerebellum. 3. Multiple scattered chronic micro hemorrhages as above, she most characteristic of chronic poorly controlled hypertension. Electronically Signed   By: Rise Mu M.D.   On: 02/01/2023 03:06   CT Head Wo Contrast  Result Date: 01/31/2023 CLINICAL DATA:  Left-sided weakness. EXAM: CT HEAD WITHOUT CONTRAST TECHNIQUE: Contiguous axial images were obtained from the base of the skull through the vertex without intravenous contrast. RADIATION DOSE REDUCTION: This exam was performed according to the departmental dose-optimization program which includes automated exposure control, adjustment of the mA and/or kV according to patient size and/or use of iterative reconstruction technique. COMPARISON:  April 10, 2022 FINDINGS: Brain: There is mild cerebral atrophy with widening of the extra-axial spaces and ventricular dilatation. There are areas of decreased attenuation within the white matter tracts of the supratentorial brain, consistent with microvascular disease changes. This is greater than expected for the patient's age. Small, chronic bilateral basal ganglia lacunar infarcts are seen. Vascular: No hyperdense vessel or  unexpected calcification. Skull: Normal. Negative for fracture or focal lesion. Sinuses/Orbits: No acute finding. Other: None. IMPRESSION: 1. Mild cerebral atrophy and microvascular disease changes of the  supratentorial brain, greater than expected for the patient's age. 2. Small, chronic bilateral basal ganglia lacunar infarcts. 3. No acute intracranial abnormality. Electronically Signed   By: Aram Candela M.D.   On: 01/31/2023 19:07   DG Chest Portable 1 View  Result Date: 01/31/2023 CLINICAL DATA:  Chest pain EXAM: PORTABLE CHEST 1 VIEW COMPARISON:  X-ray 02/04/2022 FINDINGS: Slight linear opacity lung bases, right-greater-than-left. No consolidation, pneumothorax or edema. Normal cardiopericardial silhouette. Calcified aorta. Artifact from the patient's clothing. IMPRESSION: Mild basilar atelectasis, right-greater-than-left. Electronically Signed   By: Karen Kays M.D.   On: 01/31/2023 17:55   MM 3D DIAGNOSTIC MAMMOGRAM UNILATERAL RIGHT BREAST  Result Date: 01/15/2023 CLINICAL DATA:  52 year old female presenting for short-term follow-up of a probably benign right breast asymmetry and a probably benign mass in the left breast. EXAM: DIGITAL DIAGNOSTIC UNILATERAL RIGHT MAMMOGRAM WITH TOMOSYNTHESIS; ULTRASOUND LEFT BREAST LIMITED TECHNIQUE: Right digital diagnostic mammography and breast tomosynthesis was performed.; Targeted ultrasound examination of the left breast was performed. COMPARISON:  Previous exam(s). ACR Breast Density Category c: The breasts are heterogeneously dense, which may obscure small masses. FINDINGS: Mammogram: Right breast: There is a stable asymmetry in the upper inner right breast. There are no new suspicious findings elsewhere in the right breast. Ultrasound: Targeted ultrasound performed in the left breast at 9 o'clock 9 cm from the nipple demonstrating an oval circumscribed hypoechoic mass measuring 0.8 x 0.3 x 0.7 cm, previously measuring 0.8 x 0.3 x 0.7 cm. IMPRESSION: 1. Stable probably benign asymmetry in the upper inner right breast. 2. Stable probably benign mass in the left breast at 9 o'clock, likely a fibroadenoma. RECOMMENDATION: Diagnostic bilateral mammogram and  left breast ultrasound in 6 months. I have discussed the findings and recommendations with the patient. If applicable, a reminder letter will be sent to the patient regarding the next appointment. BI-RADS CATEGORY  3: Probably benign. Electronically Signed   By: Emmaline Kluver M.D.   On: 01/15/2023 11:03  US BREAST LTD UNI LEFT INC AXILLA  Result Date: 01/15/2023 CLINICAL DATA:  52 year old female presenting for short-term follow-up of a probably benign right breast asymmetry and a probably benign mass in the left breast. EXAM: DIGITAL DIAGNOSTIC UNILATERAL RIGHT MAMMOGRAM WITH TOMOSYNTHESIS; ULTRASOUND LEFT BREAST LIMITED TECHNIQUE: Right digital diagnostic mammography and breast tomosynthesis was performed.; Targeted ultrasound examination of the left breast was performed. COMPARISON:  Previous exam(s). ACR Breast Density Category c: The breasts are heterogeneously dense, which may obscure small masses. FINDINGS: Mammogram: Right breast: There is a stable asymmetry in the upper inner right breast. There are no new suspicious findings elsewhere in the right breast. Ultrasound: Targeted ultrasound performed in the left breast at 9 o'clock 9 cm from the nipple demonstrating an oval circumscribed hypoechoic mass measuring 0.8 x 0.3 x 0.7 cm, previously measuring 0.8 x 0.3 x 0.7 cm. IMPRESSION: 1. Stable probably benign asymmetry in the upper inner right breast. 2. Stable probably benign mass in the left breast at 9 o'clock, likely a fibroadenoma. RECOMMENDATION: Diagnostic bilateral mammogram and left breast ultrasound in 6 months. I have discussed the findings and recommendations with the patient. If applicable, a reminder letter will be sent to the patient regarding the next appointment. BI-RADS CATEGORY  3: Probably benign. Electronically Signed   By: Emmaline Kluver M.D.   On: 01/15/2023 11:03  Discharge Exam: Vitals:   02/03/23 2333 02/04/23 0748  BP: 120/67 116/83  Pulse: 85 77  Resp: 17 17   Temp:  98 F (36.7 C)  SpO2: 96% 99%   Vitals:   02/03/23 1455 02/03/23 1957 02/03/23 2333 02/04/23 0748  BP: 104/84 (!) 125/90 120/67 116/83  Pulse: 99 94 85 77  Resp: 18 18 17 17   Temp: 98.7 F (37.1 C) 98.6 F (37 C) 98.2 F (36.8 C) 98 F (36.7 C)  TempSrc:  Oral Oral Oral  SpO2: 93% 99% 96% 99%  Weight:      Height:        General: Pt is alert, awake, not in acute distress Cardiovascular: RRR, S1/S2 +, no rubs, no gallops Respiratory: CTA bilaterally, no wheezing, no rhonchi Abdominal: Soft, NT, ND, bowel sounds + Extremities: no edema, no cyanosis Neuro: Alert and oriented.  4/5 power left upper and lower extremity.    The results of significant diagnostics from this hospitalization (including imaging, microbiology, ancillary and laboratory) are listed below for reference.     Microbiology: No results found for this or any previous visit (from the past 240 hour(s)).   Labs: BNP (last 3 results) No results for input(s): "BNP" in the last 8760 hours. Basic Metabolic Panel: Recent Labs  Lab 01/31/23 1650 02/01/23 0449 02/02/23 0332  NA 137 135 135  K 3.7 3.4* 3.8  CL 98 101 103  CO2 22 22 21*  GLUCOSE 122* 109* 129*  BUN 11 10 16   CREATININE 0.72 0.68 0.85  CALCIUM 9.8 9.1 9.3   Liver Function Tests: Recent Labs  Lab 02/01/23 0449 02/02/23 0332  AST 19 19  ALT 13 15  ALKPHOS 87 101  BILITOT 0.6 0.6  PROT 6.7 6.8  ALBUMIN 3.6 3.6   No results for input(s): "LIPASE", "AMYLASE" in the last 168 hours. No results for input(s): "AMMONIA" in the last 168 hours. CBC: Recent Labs  Lab 01/31/23 1650 02/01/23 0449 02/02/23 0332  WBC 7.1 5.8 5.1  NEUTROABS 3.1  --   --   HGB 13.4 12.7 13.6  HCT 43.0 41.3 42.2  MCV 78.2* 77.1* 76.6*  PLT 339 326 326   Cardiac Enzymes: No results for input(s): "CKTOTAL", "CKMB", "CKMBINDEX", "TROPONINI" in the last 168 hours. BNP: Invalid input(s): "POCBNP" CBG: Recent Labs  Lab 02/03/23 0656  02/03/23 1158 02/03/23 1629 02/03/23 2052 02/04/23 0623  GLUCAP 115* 135* 100* 120* 121*   D-Dimer No results for input(s): "DDIMER" in the last 72 hours. Hgb A1c No results for input(s): "HGBA1C" in the last 72 hours. Lipid Profile No results for input(s): "CHOL", "HDL", "LDLCALC", "TRIG", "CHOLHDL", "LDLDIRECT" in the last 72 hours. Thyroid function studies No results for input(s): "TSH", "T4TOTAL", "T3FREE", "THYROIDAB" in the last 72 hours.  Invalid input(s): "FREET3" Anemia work up No results for input(s): "VITAMINB12", "FOLATE", "FERRITIN", "TIBC", "IRON", "RETICCTPCT" in the last 72 hours. Urinalysis    Component Value Date/Time   COLORURINE YELLOW 04/10/2022 1338   APPEARANCEUR CLOUDY (A) 04/10/2022 1338   LABSPEC 1.021 04/10/2022 1338   PHURINE 5.0 04/10/2022 1338   GLUCOSEU NEGATIVE 04/10/2022 1338   HGBUR NEGATIVE 04/10/2022 1338   BILIRUBINUR NEGATIVE 04/10/2022 1338   KETONESUR NEGATIVE 04/10/2022 1338   PROTEINUR NEGATIVE 04/10/2022 1338   NITRITE NEGATIVE 04/10/2022 1338   LEUKOCYTESUR TRACE (A) 04/10/2022 1338   Sepsis Labs Recent Labs  Lab 01/31/23 1650 02/01/23 0449 02/02/23 0332  WBC 7.1 5.8 5.1   Microbiology No results found for this  or any previous visit (from the past 240 hour(s)).   Time coordinating discharge: Over 30 minutes  SIGNED:   Hughie Closs, MD  [facility] 02/04/2023, 10:35 AM *Please note that this is a verbal dictation therefore any spelling or grammatical errors are due to the "[employer] One" system interpretation. If 7PM-7AM, please contact night-coverage [email or web address]

## 2023-02-04 NOTE — Progress Notes (Signed)
Inpatient Rehab Admissions Coordinator:   I have a CIR bed for this Pt and will admit her to rehab today. RN may call report to [phone].   Pt. In agreement to d/c to CIR to participate in intensive rehab program for an estimated 7-10 days with the goal of discharging home at supervision/mod I level with the support of her mother.   Megan Salon, MS, CCC-SLP Rehab Admissions Coordinator  [phone] (celll) [phone] (office)

## 2023-02-04 NOTE — Progress Notes (Signed)
Urine specimen obtained per MD order. Specimen sent to lab.   Tilden Dome, LPN

## 2023-02-04 NOTE — H&P (Signed)
Physical Medicine and Rehabilitation Admission H&P     CC: Functional deficits secondary to acute ischemic stroke   HPI: Roberta Calderon is a 52 year old female who presented to the [facility] ED on 01/31/2023 complaining of new onset of left sided weakness and slurred speech. Symptoms started on 6/05. She is maintained on Plavix due to prior stroke. PMH significant for hypertension, tobacco use and DM-2. Neurology consulted. MRI brain-small vessel appearing infarct in the white matter on the right; infarct involving the posterior right frontal centrum semiovale and old lacunar infarcts   A1c = 6.5%. EF = 60-65%. No LVO. Out of window for tenecteplase. 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  She states she was independent in ADL and IADL and mother lived in a home behind her home. She is requiring up to min A for ADL and safety with RW.  Labs stable. The patient requires inpatient medicine and rehabilitation evaluations and services for ongoing dysfunction secondary to right frontal centrum semiovale infarct.   ROS     Past Medical History:  Diagnosis Date   Cocaine abuse (HCC) 08/14/2021   Hypertension     Non-insulin dependent type 2 diabetes mellitus (HCC) 02/01/2023   Stroke (cerebrum) (HCC) 04/10/2022    History reviewed. No pertinent surgical history. Family History  Family history unknown: Yes    Social History:  reports that she has been smoking cigarettes. She has been smoking an average of .25 packs per day. She has never used smokeless tobacco. She reports current alcohol use. She reports that she does not use drugs. Allergies:      Allergies  Allergen Reactions   Nsaids Nausea And Vomiting and Rash   Aspirin Hives          Medications Prior to Admission  Medication Sig Dispense Refill   losartan (COZAAR) 50 MG tablet Take 1 tablet (50 mg total) by mouth once daily. 90 tablet 1   [DISCONTINUED] hydrochlorothiazide (HYDRODIURIL) 25 MG tablet Take 1 tablet (25  mg total) by mouth once daily. (Patient not taking: Reported on 02/01/2023) 90 tablet 3          Home: Home Living Family/patient expects to be discharged to:: Private residence Living Arrangements: Alone Available Help at Discharge: Family, Available 24 hours/day Type of Home: House Home Access: Level entry Home Layout: One level Bathroom Shower/Tub: Engineer, manufacturing systems: Standard Bathroom Accessibility: Yes Home Equipment: None Additional Comments: lives in house behind [employer] house  Lives With: Alone   Functional History: Prior Function Prior Level of Function : Independent/Modified Independent Mobility Comments: independent with ambulation, no AD, no falls ADLs Comments: independent per pt report, doesn't drive   Functional Status:  Mobility: Bed Mobility Overal bed mobility: Needs Assistance Bed Mobility: Supine to Sit, Sit to Supine Supine to sit: Min assist, HOB elevated Sit to supine: Min guard, HOB elevated General bed mobility comments: EOB on arrival and departure Transfers Overall transfer level: Needs assistance Equipment used: Rolling walker (2 wheels) Transfers: Sit to/from Stand Sit to Stand: Min guard General transfer comment: min guard A for rise; cueing for safety and hand placement Ambulation/Gait Ambulation/Gait assistance: Min assist Gait Distance (Feet): 75 Feet Assistive device: Rolling walker (2 wheels) Gait Pattern/deviations: Step-through pattern, Decreased stride length, Decreased dorsiflexion - left, Drifts right/left, Trunk flexed General Gait Details: decreased grip with L hand on RW, decreased L foot clearance and mild imbalance. Pt impulsive, leaving the RW when going to the sink to wash her  hands, requiring cueing for proper use and management of RW, especially around obstacles. MinA needed for balance and safety, especially without AD Gait velocity: decreased   ADL: ADL Overall ADL's : Needs  assistance/impaired Eating/Feeding: Sitting, Set up Grooming: Moderate assistance, Standing Grooming Details (indicate cue type and reason): Reluctant to attempt to use LUE, requiring cues and encouragement Upper Body Bathing: Minimal assistance, Sitting Lower Body Bathing: Sit to/from stand, Minimal assistance Upper Body Dressing : Minimal assistance Lower Body Dressing: Minimal assistance Toilet Transfer: Minimal assistance, Ambulation, Cueing for sequencing Toilet Transfer Details (indicate cue type and reason): safety cues and for manipulation of RW Functional mobility during ADLs: Minimal assistance, Moderate assistance, Rolling walker (2 wheels)   Cognition: Cognition Overall Cognitive Status: No family/caregiver present to determine baseline cognitive functioning Orientation Level: Oriented X4 Cognition Arousal/Alertness: Awake/alert Behavior During Therapy: Flat affect Overall Cognitive Status: No family/caregiver present to determine baseline cognitive functioning General Comments: A&Ox4, lethargic upon arrival, but more alert once EOB. Pt flat on arrival and other staff reporting she has been agitated, but no agitation demonstrated during OT session. Pt did however demonstrate frustration with desire to shower and be able to go outside. Pt with mild memory deficits, min difficulty requiring mod cues to answer simple financlial management questions. Pt generally aware of deficits. No family present to determine baseline, but pt reports family can assist at home.   Physical Exam: Blood pressure 116/83, pulse 77, temperature 98 F (36.7 C), temperature source Oral, resp. rate 17, height 5\' 3"  (1.6 m), weight 66.8 kg, SpO2 99 %. Physical Exam   General: No acute distress Mood and affect are appropriate Heart: Regular rate and rhythm no rubs murmurs or extra sounds Lungs: Clear to auscultation, breathing unlabored, no rales or wheezes Abdomen: Positive bowel sounds, soft nontender  to palpation, nondistended Extremities: No clubbing, cyanosis, or edema Skin: No evidence of breakdown, no evidence of rash Neurologic: Cranial nerves II through XII intact, motor strength is 5/5 in RIght and 4/5 left  deltoid, bicep, tricep, grip, hip flexor, knee extensors, ankle dorsiflexor and plantar flexor Sensory exam normal sensation to light touch in bilateral upper and lower extremities Cerebellar exam mild dysmetria Left FNF  Musculoskeletal: Full range of motion in all 4 extremities. No joint swelling \ Lab Results Last 48 Hours        Results for orders placed or performed during the hospital encounter of 01/31/23 (from the past 48 hour(s))  Glucose, capillary     Status: Abnormal    Collection Time: 02/02/23 12:25 PM  Result Value Ref Range    Glucose-Capillary 134 (H) 70 - 99 mg/dL      Comment: Glucose reference range applies only to samples taken after fasting for at least 8 hours.    Comment 1 Notify RN      Comment 2 Document in Chart    Glucose, capillary     Status: Abnormal    Collection Time: 02/02/23  4:33 PM  Result Value Ref Range    Glucose-Capillary 136 (H) 70 - 99 mg/dL      Comment: Glucose reference range applies only to samples taken after fasting for at least 8 hours.    Comment 1 Notify RN      Comment 2 Document in Chart    Glucose, capillary     Status: Abnormal    Collection Time: 02/02/23  7:59 PM  Result Value Ref Range    Glucose-Capillary 163 (H) 70 - 99 mg/dL  Comment: Glucose reference range applies only to samples taken after fasting for at least 8 hours.  Glucose, capillary     Status: Abnormal    Collection Time: 02/03/23  6:56 AM  Result Value Ref Range    Glucose-Capillary 115 (H) 70 - 99 mg/dL      Comment: Glucose reference range applies only to samples taken after fasting for at least 8 hours.    Comment 1 Notify RN      Comment 2 Document in Chart    Glucose, capillary     Status: Abnormal    Collection Time: 02/03/23  11:58 AM  Result Value Ref Range    Glucose-Capillary 135 (H) 70 - 99 mg/dL      Comment: Glucose reference range applies only to samples taken after fasting for at least 8 hours.  Glucose, capillary     Status: Abnormal    Collection Time: 02/03/23  4:29 PM  Result Value Ref Range    Glucose-Capillary 100 (H) 70 - 99 mg/dL      Comment: Glucose reference range applies only to samples taken after fasting for at least 8 hours.  Glucose, capillary     Status: Abnormal    Collection Time: 02/03/23  8:52 PM  Result Value Ref Range    Glucose-Capillary 120 (H) 70 - 99 mg/dL      Comment: Glucose reference range applies only to samples taken after fasting for at least 8 hours.  Glucose, capillary     Status: Abnormal    Collection Time: 02/04/23  6:23 AM  Result Value Ref Range    Glucose-Capillary 121 (H) 70 - 99 mg/dL      Comment: Glucose reference range applies only to samples taken after fasting for at least 8 hours.      Imaging Results (Last 48 hours)  No results found.         Blood pressure 116/83, pulse 77, temperature 98 F (36.7 C), temperature source Oral, resp. rate 17, height 5\' 3"  (1.6 m), weight 66.8 kg, SpO2 99 %.   Medical Problem List and Plan: 1. Functional deficits secondary to RIght frontal infarcts             -patient may  shower             -ELOS/Goals: 7-10d Sup goals   2.  Antithrombotics: -DVT/anticoagulation:  Pharmaceutical: Lovenox             -antiplatelet therapy: Plavix    3. Pain Management: Tylenol as needed   4. Mood/Behavior/Sleep: LCSW to evaluate and provide emotional support             -antipsychotic agents: n/a             -continue home trazodone 25-50 mg q HS   5. Neuropsych/cognition: This patient is capable of making decisions on her own behalf.   6. Skin/Wound Care: Routine skin care checks   7. Fluids/Electrolytes/Nutrition: Routine Is and Os and follow-up chemistries   8: Hypertension: monitor TID and prn (home  meds>>losartan 50 mg daily and HCTZ 25 mg daily)             -continue losartan 50 mg daily   9: Hyperlipidemia: continue statin   10: DM-2: CBGs QID; carb modified; A1c = 6.5%; home meds>>metformin 500 mg daily -continue SSI (currently not requiring coverage)   11: Tobacco use: cessation counseling             -continue nicotine patch  Milinda Antis, PA-C 02/04/2023 "I have personally performed a face to face diagnostic evaluation of this patient.  Additionally, I have reviewed and concur with the physician assistant's documentation above." Erick Colace M.D. [facility] Fellow Am [employer] of [employer] and Re[employer] Diplomate Am Board of Electrodiagnostic Med Fellow Am Board of Interventional Pain

## 2023-02-04 NOTE — Progress Notes (Signed)
Patient refused vitals.

## 2023-02-04 NOTE — Progress Notes (Signed)
Arrived to CIR today. Oriented to the unit and assigned to room. Admission/Assessment.    Tilden Dome, LPN

## 2023-02-04 NOTE — PMR Pre-admission (Signed)
PMR Admission Coordinator Pre-Admission Assessment  Patient: Roberta Calderon is an 52 y.o., female MRN: [id number] DOB: Feb 07, 1971 Height: 5\' 3"  (160 cm) Weight: 66.8 kg  Insurance Information HMO:     PPO:      PCP:      IPA:      80/20:      OTHER:  PRIMARY: Medicaid [place] Access       Policy#: [id number]       Subscriber:  CM Name:       Phone#:      Fax#:  Pre-Cert#:       Employer:  Benefits:  Phone #:      Name:  Eff. Date:   effective 02/04/23   Deduct:       Out of Pocket Max:       Life Max:  CIR:       SNF:  Outpatient:      Co-Pay:  Home Health:       Co-Pay:  DME:      Co-Pay:  Providers:  SECONDARY:       Policy#:      Phone#:   Artist:       Phone#:   The Data processing manager" for patients in Inpatient Rehabilitation Facilities with attached "Privacy Act Statement-Health Care Records" was provided and verbally reviewed with: N/A  Emergency Contact Information Contact Information     Name Relation Home Work [place], [place] Daughter   [phone]   [facility] Mother [phone]         Current Medical History  Patient Admitting Diagnosis: CVA History of Present Illness:  Roberta Calderon is a 52 y.o. female with medical history significant for hypertension, type 2 diabetes mellitus, CVA with right-sided weakness, and history of substance abuse in remission who presented to the emergency department at [facility] with left-sided weakness and speech difficulty on 02/04/23.  Upon arrival to the ED, patient is found to be afebrile and saturating well on room air with normal heart rate and stable blood pressure.  EKG demonstrates sinus rhythm.  No acute intracranial abnormality is noted on head CT.  MRI brain reveals acute ischemic infarction in the posterior right frontal centrum ovale as well as scattered microhemorrhages.  CCT head No acute abnormality. Small vessel disease. Atrophy.  Small, chronic  bilateral basal ganglia infarcts. CTA head & neck no LVO or hemodynamically significant stenosis.  MRI showed acute ischemic infarct in posterior right frontal centrum semiovale, moderate to advanced chronic microvascular ischemic disease with multiple remote lacunar infarcts, multiple scattered chronic microhemorrhages. Pt. On No antithrombotic prior to admission, now on clopidogrel 75 mg daily.  Aspirin not given due to allergy. Pt. Seen by PT/OT/SLP and they recommend CIR to assist return to PLOF.   Complete NIHSS TOTAL: 3  Patient's medical record from [facility]  has been reviewed by the rehabilitation admission coordinator and physician.  Past Medical History  Past Medical History:  Diagnosis Date   Cocaine abuse (HCC) 08/14/2021   Hypertension    Non-insulin dependent type 2 diabetes mellitus (HCC) 02/01/2023   Stroke (cerebrum) (HCC) 04/10/2022    Has the patient had major surgery during 100 days prior to admission? No  Family History   Family history is unknown by patient.  Current Medications  Current Facility-Administered Medications:    acetaminophen (TYLENOL) tablet 650 mg, 650 mg, Oral, Q4H PRN **OR** acetaminophen (TYLENOL) 160 MG/5ML solution 650 mg, 650 mg,  Per Tube, Q4H PRN **OR** acetaminophen (TYLENOL) suppository 650 mg, 650 mg, Rectal, Q4H PRN, Opyd, Lavone Neri, MD   atorvastatin (LIPITOR) tablet 80 mg, 80 mg, Oral, Daily, de Saintclair Halsted, [place] E, NP, 80 mg at 02/03/23 1610   clopidogrel (PLAVIX) tablet 75 mg, 75 mg, Oral, Daily, Opyd, Lavone Neri, MD, 75 mg at 02/03/23 0839   enoxaparin (LOVENOX) injection 40 mg, 40 mg, Subcutaneous, Q24H, Opyd, Lavone Neri, MD, 40 mg at 02/03/23 0840   hydrALAZINE (APRESOLINE) injection 10 mg, 10 mg, Intravenous, Q6H PRN, Pahwani, Ravi, MD   insulin aspart (novoLOG) injection 0-5 Units, 0-5 Units, Subcutaneous, QHS, Opyd, Timothy S, MD   insulin aspart (novoLOG) injection 0-6 Units, 0-6 Units, Subcutaneous, TID WC,  Opyd, Lavone Neri, MD   losartan (COZAAR) tablet 50 mg, 50 mg, Oral, Daily, Pahwani, Ravi, MD   nicotine (NICODERM CQ - dosed in mg/24 hr) patch 7 mg, 7 mg, Transdermal, Daily, de Saintclair Halsted, Box E, NP, 7 mg at 02/03/23 0844   senna-docusate (Senokot-S) tablet 1 tablet, 1 tablet, Oral, QHS PRN, Opyd, Lavone Neri, MD   traZODone (DESYREL) tablet 25-50 mg, 25-50 mg, Oral, QHS, Opyd, Lavone Neri, MD, 50 mg at 02/03/23 2049  Patients Current Diet:  Diet Order             Diet regular Fluid consistency: Thin  Diet effective now                   Precautions / Restrictions Precautions Precautions: Fall Restrictions Weight Bearing Restrictions: No   Has the patient had 2 or more falls or a fall with injury in the past year? No  Prior Activity Level Community (5-7x/wk): Pt. active in the community PTA  Prior Functional Level Self Care: Did the patient need help bathing, dressing, using the toilet or eating? Independent  Indoor Mobility: Did the patient need assistance with walking from room to room (with or without device)? Independent  Stairs: Did the patient need assistance with internal or external stairs (with or without device)? Independent  Functional Cognition: Did the patient need help planning regular tasks such as shopping or remembering to take medications? Independent  Patient Information Are you of Hispanic, Latino/a,or Spanish origin?: A. No, not of Hispanic, Latino/a, or Spanish origin What is your race?: B. Black or African American Do you need or want an interpreter to communicate with a doctor or health care staff?: 0. No  Patient's Response To:  Health Literacy and Transportation Is the patient able to respond to health literacy and transportation needs?: Yes Health Literacy - How often do you need to have someone help you when you read instructions, pamphlets, or other written material from your doctor or pharmacy?: Never In the past 12 months, has lack of  transportation kept you from medical appointments or from getting medications?: No In the past 12 months, has lack of transportation kept you from meetings, work, or from getting things needed for daily living?: No  Home Assistive Devices / Equipment Home Assistive Devices/Equipment: Medical laboratory scientific officer (specify quad or straight) Home Equipment: None  Prior Device Use: Indicate devices/aids used by the patient prior to current illness, exacerbation or injury? None of the above  Current Functional Level Cognition  Overall Cognitive Status: No family/caregiver present to determine baseline cognitive functioning Orientation Level: Oriented X4 General Comments: A&Ox4, lethargic upon arrival, but more alert once EOB. Pt flat on arrival and other staff reporting she has been agitated, but no agitation demonstrated during OT session.  Pt did however demonstrate frustration with desire to shower and be able to go outside. Pt with mild memory deficits, min difficulty requiring mod cues to answer simple financlial management questions. Pt generally aware of deficits. No family present to determine baseline, but pt reports family can assist at home.    Extremity Assessment (includes Sensation/Coordination)  Upper Extremity Assessment: LUE deficits/detail LUE Deficits / Details: 3/5 MMT, however able to squeeze green foam xube LUE Sensation: decreased light touch LUE Coordination: decreased fine motor  Lower Extremity Assessment: Defer to PT evaluation LLE Deficits / Details: 3-/5 hip flexion and knee extension, questionable effort. ~1/ ankle PF/DF LLE Sensation: decreased light touch LLE Coordination: decreased gross motor    ADLs  Overall ADL's : Needs assistance/impaired Eating/Feeding: Sitting, Set up Grooming: Moderate assistance, Standing Grooming Details (indicate cue type and reason): Reluctant to attempt to use LUE, requiring cues and encouragement Upper Body Bathing: Minimal assistance, Sitting Lower  Body Bathing: Sit to/from stand, Minimal assistance Upper Body Dressing : Minimal assistance Lower Body Dressing: Minimal assistance Toilet Transfer: Minimal assistance, Ambulation, Cueing for sequencing Toilet Transfer Details (indicate cue type and reason): safety cues and for manipulation of RW Functional mobility during ADLs: Minimal assistance, Moderate assistance, Rolling walker (2 wheels)    Mobility  Overal bed mobility: Needs Assistance Bed Mobility: Supine to Sit, Sit to Supine Supine to sit: Min assist, HOB elevated Sit to supine: Min guard, HOB elevated General bed mobility comments: EOB on arrival and departure    Transfers  Overall transfer level: Needs assistance Equipment used: Rolling walker (2 wheels) Transfers: Sit to/from Stand Sit to Stand: Min guard General transfer comment: min guard A for rise; cueing for safety and hand placement    Ambulation / Gait / Stairs / Wheelchair Mobility  Ambulation/Gait Ambulation/Gait assistance: Editor, commissioning (Feet): 75 Feet Assistive device: Rolling walker (2 wheels) Gait Pattern/deviations: Step-through pattern, Decreased stride length, Decreased dorsiflexion - left, Drifts right/left, Trunk flexed General Gait Details: decreased grip with L hand on RW, decreased L foot clearance and mild imbalance. Pt impulsive, leaving the RW when going to the sink to wash her hands, requiring cueing for proper use and management of RW, especially around obstacles. MinA needed for balance and safety, especially without AD Gait velocity: decreased    Posture / Balance Balance Overall balance assessment: Needs assistance Sitting-balance support: Bilateral upper extremity supported, Feet supported Sitting balance-Leahy Scale: Fair Standing balance support: Bilateral upper extremity supported, During functional activity, Reliant on assistive device for balance Standing balance-Leahy Scale: Poor Standing balance comment: reliant on  RW    Special needs/care consideration Special service needs none    Previous Home Environment (from acute therapy documentation) Living Arrangements: Alone  Lives With: Alone Available Help at Discharge: Family, Available 24 hours/day Type of Home: House Home Layout: One level Home Access: Level entry Bathroom Shower/Tub: Engineer, manufacturing systems: Standard Bathroom Accessibility: Yes Home Care Services: No Additional Comments: lives in house behind mom's house  Discharge Living Setting Plans for Discharge Living Setting: House Type of Home at Discharge: House Discharge Home Layout: One level Discharge Home Access: Stairs to enter Entrance Stairs-Rails: None Entrance Stairs-Number of Steps: 1 Discharge Bathroom Shower/Tub: Tub/shower unit Discharge Bathroom Toilet: Standard Discharge Bathroom Accessibility: No Does the patient have any problems obtaining your medications?: No  Social/Family/Support Systems Patient Roles: Parent Contact Information: [phone] Anticipated Caregiver: Lyvonne Anticipated Caregiver's Contact Information: 24/7 Ability/Limitations of Caregiver: Supervision Caregiver Availability: 24/7 Discharge Plan Discussed with Primary  Caregiver: Yes Is Caregiver In Agreement with Plan?: No Does Caregiver/Family have Issues with Lodging/Transportation while Pt is in Rehab?: Yes  Goals Patient/Family Goal for Rehab: PT/OT/SLP Supervision-mod I Expected length of stay: 7-10 days Pt/Family Agrees to Admission and willing to participate: Yes Program Orientation Provided & Reviewed with Pt/Caregiver Including Roles  & Responsibilities: Yes  Decrease burden of Care through IP rehab admission: none   Possible need for SNF placement upon discharge: none   Patient Condition: I have reviewed medical records from [facility], spoken with CM, and patient and family member. I met with patient at the bedside for inpatient rehabilitation  assessment.  Patient will benefit from ongoing PT, OT, and SLP, can actively participate in 3 hours of therapy a day 5 days of the week, and can make measurable gains during the admission.  Patient will also benefit from the coordinated team approach during an Inpatient Acute Rehabilitation admission.  The patient will receive intensive therapy as well as Rehabilitation physician, nursing, social worker, and care management interventions.  Due to safety, skin/wound care, disease management, medication administration, pain management, and patient education the patient requires 24 hour a day rehabilitation nursing.  The patient is currently min A-min g with mobility and basic ADLs.  Discharge setting and therapy post discharge at home with home health is anticipated.  Patient has agreed to participate in the Acute Inpatient Rehabilitation Program and will admit today.  Preadmission Screen Completed By:  Jeronimo Greaves, 02/04/2023 9:31 AM ______________________________________________________________________   Discussed status with Dr. Riley Kill  on 02/04/23 at 1000 and received approval for admission today.  Admission Coordinator:  Jeronimo Greaves, CCC-SLP, time 1000/Date 02/04/23   Assessment/Plan: Diagnosis:RIght frontal infarcts Does the need for close, 24 hr/day Medical supervision in concert with the patient's rehab needs make it unreasonable for this patient to be served in a less intensive setting? Yes Co-Morbidities requiring supervision/potential complications: DM, HTN Due to bladder management, bowel management, safety, skin/wound care, disease management, medication administration, pain management, and patient education, does the patient require 24 hr/day rehab nursing? Yes Does the patient require coordinated care of a physician, rehab nurse, PT, OT, and SLP to address physical and functional deficits in the context of the above medical diagnosis(es)? Yes Addressing deficits in the following areas:  balance, endurance, locomotion, strength, transferring, bowel/bladder control, bathing, dressing, feeding, grooming, toileting, cognition, and psychosocial support Can the patient actively participate in an intensive therapy program of at least 3 hrs of therapy 5 days a week? Yes The potential for patient to make measurable gains while on inpatient rehab is good Anticipated functional outcomes upon discharge from inpatient rehab: modified independent and supervision PT, modified independent and supervision OT, modified independent and supervision SLP Estimated rehab length of stay to reach the above functional goals is: 7-10d Anticipated discharge destination: Home 10. Overall Rehab/Functional Prognosis: good   MD Signature: Erick Colace M.D. [facility] Fellow Am Acad of Phys Med and Rehab Diplomate Am Board of Electrodiagnostic Med Fellow Am Board of Interventional Pain

## 2023-02-04 NOTE — Telephone Encounter (Signed)
LCSWA called patient today to introduce herself and to assess patients' mental health needs. Patient did not answer the phone. LCSWA was not able to leave a brief message mailbox was full. Patient was referred by PCP for Depression With Anxiety (Primary)

## 2023-02-04 NOTE — Progress Notes (Signed)
Attempted report to CIR but not ready at this time. Stated they would call when they received paperwork and were ready for report. Bedside RN made aware

## 2023-02-04 NOTE — TOC Transition Note (Signed)
Transition of Care [facility]) - CM/SW Discharge Note   Patient Details  Name: Roberta Calderon MRN: [id number] Date of Birth: 10-Dec-1970  Transition of [facility]) CM/SW Contact:  Kermit Balo, RN Phone Number: 02/04/2023, 11:41 AM   Clinical Narrative:    Pt is discharging to CIR today. CM signing off.   Final next level of care: IP Rehab Facility Barriers to Discharge: No Barriers Identified   Patient Goals and CMS Choice CMS Medicare.gov Compare Post Acute Care list provided to:: Patient Choice offered to / list presented to : Patient  Discharge Placement                         Discharge Plan and Services Additional resources added to the After Visit Summary for                                       Social Determinants of Health (SDOH) Interventions SDOH Screenings   Food Insecurity: No Food Insecurity (02/01/2023)  Housing: Patient Declined (02/01/2023)  Transportation Needs: No Transportation Needs (02/01/2023)  Utilities: Not At Risk (02/01/2023)  Depression (PHQ2-9): Low Risk  (01/16/2023)  Tobacco Use: High Risk (02/01/2023)     Readmission Risk Interventions     No data to display

## 2023-02-04 NOTE — Progress Notes (Signed)
Inpatient Rehabilitation Admission Medication Review by a Pharmacist  A complete drug regimen review was completed for this patient to identify any potential clinically significant medication issues.  High Risk Drug Classes Is patient taking? Indication by Medication  Antipsychotic No   Anticoagulant Yes Lovenox - VTE ppx  Antibiotic No   Opioid No   Antiplatelet Yes Clopidogrel - CVA  Hypoglycemics/insulin Yes Insulin - DM  Vasoactive Medication Yes Losartan - BP  Chemotherapy No   Other Yes Atorvastatin - HLD Trazodone - sleep     Type of Medication Issue Identified Description of Issue Recommendation(s)  Drug Interaction(s) (clinically significant)     Duplicate Therapy     Allergy     No Medication Administration End Date     Incorrect Dose     Additional Drug Therapy Needed     Significant med changes from prior encounter (inform family/care partners about these prior to discharge).    Other       Clinically significant medication issues were identified that warrant physician communication and completion of prescribed/recommended actions by midnight of the next day:  No  Pharmacist comments: None  Time spent performing this drug regimen review (minutes):  20 minutes  Thank you Okey Regal, PharmD

## 2023-02-05 DIAGNOSIS — I639 Cerebral infarction, unspecified: Secondary | ICD-10-CM

## 2023-02-05 LAB — CBC WITH DIFFERENTIAL/PLATELET
Abs Immature Granulocytes: 0.01 10*3/uL (ref 0.00–0.07)
Basophils Absolute: 0 10*3/uL (ref 0.0–0.1)
Basophils Relative: 0 %
Eosinophils Absolute: 0.2 10*3/uL (ref 0.0–0.5)
Eosinophils Relative: 2 %
HCT: 40.8 % (ref 36.0–46.0)
Hemoglobin: 12.8 g/dL (ref 12.0–15.0)
Immature Granulocytes: 0 %
Lymphocytes Relative: 40 %
Lymphs Abs: 2.7 10*3/uL (ref 0.7–4.0)
MCH: 23.8 pg — ABNORMAL LOW (ref 26.0–34.0)
MCHC: 31.4 g/dL (ref 30.0–36.0)
MCV: 75.8 fL — ABNORMAL LOW (ref 80.0–100.0)
Monocytes Absolute: 0.5 10*3/uL (ref 0.1–1.0)
Monocytes Relative: 7 %
Neutro Abs: 3.4 10*3/uL (ref 1.7–7.7)
Neutrophils Relative %: 51 %
Platelets: 335 10*3/uL (ref 150–400)
RBC: 5.38 MIL/uL — ABNORMAL HIGH (ref 3.87–5.11)
RDW: 13.7 % (ref 11.5–15.5)
WBC: 6.8 10*3/uL (ref 4.0–10.5)
nRBC: 0 % (ref 0.0–0.2)

## 2023-02-05 LAB — COMPREHENSIVE METABOLIC PANEL
ALT: 16 U/L (ref 0–44)
AST: 20 U/L (ref 15–41)
Albumin: 3.6 g/dL (ref 3.5–5.0)
Alkaline Phosphatase: 106 U/L (ref 38–126)
Anion gap: 11 (ref 5–15)
BUN: 18 mg/dL (ref 6–20)
CO2: 22 mmol/L (ref 22–32)
Calcium: 9.4 mg/dL (ref 8.9–10.3)
Chloride: 103 mmol/L (ref 98–111)
Creatinine, Ser: 0.87 mg/dL (ref 0.44–1.00)
GFR, Estimated: >60 mL/min (ref >60)
Glucose, Bld: 119 mg/dL — ABNORMAL HIGH (ref 70–99)
Potassium: 3.8 mmol/L (ref 3.5–5.1)
Sodium: 136 mmol/L (ref 135–145)
Total Bilirubin: 0.7 mg/dL (ref 0.3–1.2)
Total Protein: 6.9 g/dL (ref 6.5–8.1)

## 2023-02-05 LAB — GLUCOSE, CAPILLARY
Glucose-Capillary: 113 mg/dL — ABNORMAL HIGH (ref 70–99)
Glucose-Capillary: 148 mg/dL — ABNORMAL HIGH (ref 70–99)
Glucose-Capillary: 109 mg/dL — ABNORMAL HIGH (ref 70–99)

## 2023-02-05 NOTE — Discharge Summary (Signed)
Physician Discharge Summary  Patient ID: Roberta Calderon MRN: [id number] DOB/AGE: Apr 14, 1971 52 y.o.  Admit date: 02/04/2023 Discharge date: 02/06/2023  Discharge Diagnoses:  Principal Problem:   Small vessel cerebrovascular accident (CVA) (HCC) Active problems: Deficits secondary to small vessel CVA Insomnia Hypertension Hyperlipidemia Diabetes mellitus type 2 Tobacco use   Discharged Condition: stable  Significant Diagnostic Studies: none  Labs:  Basic Metabolic Panel: Recent Labs  Lab 01/31/23 1650 02/01/23 0449 02/02/23 0332 02/04/23 1544 02/05/23 0603  NA 137 135 135  --  136  K 3.7 3.4* 3.8  --  3.8  CL 98 101 103  --  103  CO2 22 22 21*  --  22  GLUCOSE 122* 109* 129*  --  119*  BUN 11 10 16   --  18  CREATININE 0.72 0.68 0.85 0.98 0.87  CALCIUM 9.8 9.1 9.3  --  9.4    CBC: Recent Labs  Lab 01/31/23 1650 02/01/23 0449 02/02/23 0332 02/04/23 1544 02/05/23 0603  WBC 7.1   < > 5.1 6.3 6.8  NEUTROABS 3.1  --   --   --  3.4  HGB 13.4   < > 13.6 13.1 12.8  HCT 43.0   < > 42.2 43.2 40.8  MCV 78.2*   < > 76.6* 77.8* 75.8*  PLT 339   < > 326 342 335   < > = values in this interval not displayed.    CBG: Recent Labs  Lab 02/04/23 1259 02/04/23 1643 02/05/23 0635 02/05/23 1123 02/05/23 1641  GLUCAP 129* 129* 113* 148* 109*    Brief HPI:   Roberta Calderon is a 52 y.o. female who presented to the [facility] ED on 01/31/2023 complaining of new onset of left sided weakness and slurred speech. Symptoms started on 6/05. She is maintained on Plavix due to prior stroke. PMH significant for hypertension, tobacco use and DM-2. Neurology consulted. MRI brain-small vessel appearing infarct in the white matter on the right; infarct involving the posterior right frontal centrum semiovale and old lacunar infarcts   A1c = 6.5%. EF = 60-65%. No LVO. Out of window for tenecteplase. 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  She states she was independent  in ADL and IADL and mother lived in a home behind her home. She is requiring up to min A for ADL and safety with RW.  Labs stable.    Hospital Course: Roberta Calderon was admitted to rehab 02/04/2023 for inpatient therapies to consist of PT, ST and OT at least three hours five days a week. Past admission physiatrist, therapy team and rehab RN have worked together to provide customized collaborative inpatient rehab. PMH cocaine abuse [facility] cocaine detox admit 08/15/21,02/07/21 UDS + , pt states she has not used since 2021, no UDS in ED this admit or in 2023.   Blood pressures were monitored on TID basis and losartan 50 mg daily continued.  Diabetes has been monitored with ac/hs CBG checks and SSI was use prn for tighter BS control.  She did not require insulin coverage and CBGs were discontinued.  Rehab course: During patient's stay in rehab weekly team conferences were held to monitor patient's progress, set goals and discuss barriers to discharge. At admission, patient required CGA  with basic self-care skills and supervision with mobility. She  has had improvement in activity tolerance, balance, postural control as well as ability to compensate for deficits. She has had improvement in functional use RUE/LUE  and RLE/LLE  as well as improvement in awareness. Discharge to supervision level. PT, OT and ST arranged at [facility].   Discharge disposition: 01-Home or Self Care     Diet: heart healthy, carb modified  Special Instructions: No driving, alcohol consumption or tobacco use.  Recommend daily BP measurement in same arm and record time of day. Bring this information with you to follow-up appointment with PCP.  30-35 minutes were spent on discharge planning and discharge summary. Discharge Instructions     Discharge patient   Complete by: As directed    Discharge disposition: 01-Home or Self Care   Discharge patient date: 02/06/2023      Allergies as of 02/06/2023       Reactions    Nsaids Nausea And Vomiting, Rash   Asa [aspirin] Hives        Medication List     TAKE these medications    acetaminophen 325 MG tablet Commonly known as: TYLENOL Take 2 tablets (650 mg total) by mouth every 6 (six) hours as needed for mild pain (or temp > 37.5 C (99.5 F)).   atorvastatin 80 MG tablet Commonly known as: LIPITOR Take 1 tablet (80 mg total) by mouth daily.   clopidogrel 75 MG tablet Commonly known as: PLAVIX Take 1 tablet (75 mg total) by mouth daily.   losartan 50 MG tablet Commonly known as: COZAAR Take 1 tablet (50 mg total) by mouth once daily.   nicotine 21 mg/24hr patch Commonly known as: NICODERM CQ - dosed in mg/24 hours Place 1 patch (21 mg total) onto the skin daily.   traZODone 50 MG tablet Commonly known as: DESYREL Take 0.5-1 tablets (25-50 mg total) by mouth at bedtime.        Follow-up Information     Grayce Sessions, NP. Go to.   Specialty: Internal Medicine Contact information: [address] [place] [address] [phone]         [facility] Follow up.   Why: Call the office in 1-2 days to make arrangements for hospital follow-up appointment. Contact information: [address]     [address] [place] [address] [phone]        Erick Colace, MD Follow up.   Specialty: Physical Medicine and Rehabilitation Why: office will call you to arrange your appt (sent) Contact information: [address] [place] [address] [phone]                 Signed: Milinda Antis 02/06/2023, 3:16 PM

## 2023-02-05 NOTE — Evaluation (Signed)
Speech Language Pathology Assessment and Plan  Patient Details  Name: Roberta Calderon MRN: [id number] Date of Birth: 1970-11-07  SLP Diagnosis: Dysarthria  Rehab Potential: Excellent ELOS: N/A as pt plans to d/c tomorrow with family assist; Pt is safe to d/c tomorrow with family assist and 24/7 supervision.    Today's Date: 02/05/2023 SLP Individual Time:  -    Hospital Problem: Principal Problem:   Small vessel cerebrovascular accident (CVA) [facility])  Past Medical History:  Past Medical History:  Diagnosis Date   Cocaine abuse (HCC) 08/14/2021   Hypertension    Non-insulin dependent type 2 diabetes mellitus (HCC) 02/01/2023   Stroke (cerebrum) (HCC) 04/10/2022   Past Surgical History: History reviewed. No pertinent surgical history.  Assessment / Plan / Recommendation Clinical Impression  Roberta Calderon is a 52 year old female who presented to the [facility] ED on 01/31/2023 complaining of new onset of left sided weakness and slurred speech. Symptoms started on 6/05. She is maintained on Plavix due to prior stroke. PMH significant for hypertension, tobacco use and DM-2. Neurology consulted. MRI brain-small vessel appearing infarct in the white matter on the right; infarct involving the posterior right frontal centrum semiovale and old lacunar infarcts A1c = 6.5%. EF = 60-65%. No LVO. Out of window for tenecteplase. 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 Admitted to CIR on 02/04/23  Pt presents with a mild dysarthria. Oral mechanism exam completed and revealed mild left side facial droop and sparse dentition; otherwise oral mechanism exam was unremarkable. Dysarthria was c/b imprecise articulation and decreased speech intelligibility. She presented with irregular AMRs and SMRs. Pt was 100% intelligible with words and phrases and 80% intelligible in sentences and conversation. She denied language deficits. Informally, pt able to express recent medical hx, PLOF, and answer  biographical questions.  Cognitive assessment not completed at this time due to pt declining to participate in formal assessment. Pt was A&O x4 during evaluation. Observed deficits in awareness and impulsivity throughout session indicating possible cognitive deficits. She reports she was indep for medication management, finances, and cooking. She denied changes in cognitive functioning post CVA. Pt consumed trials of thin liquid and regular solids. Pt presented with adequate bolus formation transit, and initiation of swallow. She was without s/sx pen/asp. Per pt's nurse pt tolerated meds whole with liquid without impairment.   Pt would benefit from skilled SLP services to maximize cognition and dysarthria in order to maximize her independence prior to discharge. Anticipate pt will achieve mod I for communication with no further skilled ST intervention warranted.   Skilled Therapeutic Interventions          informal assessment measures administered. Please see full report for additional details.     SLP Assessment  Patient will need skilled Speech Lanaguage Pathology Services during CIR admission    Recommendations  SLP Diet Recommendations: Thin Liquid Administration via: Straw;Cup Medication Administration: Whole meds with liquid Supervision: Patient able to self feed Compensations: Small sips/bites;Slow rate Postural Changes and/or Swallow Maneuvers: Upright 30-60 min after meal;Seated upright 90 degrees Oral Care Recommendations: Oral care BID Recommendations for Other Services: Neuropsych consult Patient destination: Home Follow up Recommendations: 24 hour supervision/assistance;Outpatient SLP Equipment Recommended: None recommended by SLP    SLP Frequency 1 to 3 out of 7 days   SLP Duration  SLP Intensity  SLP Treatment/Interventions N/A as pt plans to d/c tomorrow with family assist; Pt is safe to d/c tomorrow with family assist and 24/7 supervision.  Minumum of  1-2 x/day, 30 to 90  minutes  Functional tasks;Oral motor exercises;Internal/external aids;Patient/family education;Speech/Language facilitation;Therapeutic Exercise;Therapeutic Activities    Pain Pain Assessment Pain Scale: 0-10 Pain Score: 0-No pain  Prior Functioning Cognitive/Linguistic Baseline: Within functional limits Type of Home: House  Lives With: Alone Available Help at Discharge: Family;Other (Comment);Available PRN/intermittently Vocation: On disability  SLP Evaluation Cognition Overall Cognitive Status: No family/caregiver present to determine baseline cognitive functioning Arousal/Alertness: Awake/alert Orientation Level: Oriented X4 Year: 2024 Month: June Day of Week: Correct Attention: Focused;Sustained Focused Attention: Appears intact Sustained Attention: Appears intact Memory: Appears intact Awareness: Impaired Awareness Impairment: Intellectual impairment Problem Solving: Impaired Problem Solving Impairment: Verbal basic;Functional basic Executive Function: Decision Making;Self Monitoring Decision Making: Impaired Decision Making Impairment: Verbal basic;Functional basic Self Monitoring: Impaired Self Monitoring Impairment: Functional basic;Verbal basic Behaviors: Impulsive Safety/Judgment: Impaired  Comprehension Auditory Comprehension Overall Auditory Comprehension: Appears within functional limits for tasks assessed Expression Expression Primary Mode of Expression: Verbal Verbal Expression Overall Verbal Expression: Appears within functional limits for tasks assessed Interfering Components: Speech intelligibility Written Expression Dominant Hand: Right Oral Motor Oral Motor/Sensory Function Overall Oral Motor/Sensory Function: Mild impairment Facial ROM: Reduced right Facial Symmetry: Abnormal symmetry right Facial Strength: Within Functional Limits Facial Sensation: Within Functional Limits Lingual ROM: Within Functional Limits Lingual Symmetry: Within  Functional Limits Lingual Strength: Within Functional Limits Velum: Within Functional Limits Mandible: Within Functional Limits Motor Speech Overall Motor Speech: Impaired Respiration: Within functional limits Phonation: Normal Resonance: Within functional limits Articulation: Impaired Level of Impairment: Sentence Intelligibility: Intelligibility reduced Word: 75-100% accurate Phrase: 75-100% accurate Sentence: 75-100% accurate Conversation: 75-100% accurate Motor Planning: Not tested Effective Techniques: Slow rate;Over-articulate  Care Tool Care Tool Cognition Ability to hear (with hearing aid or hearing appliances if normally used Ability to hear (with hearing aid or hearing appliances if normally used): 0. Adequate - no difficulty in normal conservation, social interaction, listening to TV   Expression of Ideas and Wants Expression of Ideas and Wants: 3. Some difficulty - exhibits some difficulty with expressing needs and ideas (e.g, some words or finishing thoughts) or speech is not clear   Understanding Verbal and Non-Verbal Content Understanding Verbal and Non-Verbal Content: 4. Understands (complex and basic) - clear comprehension without cues or repetitions  Memory/Recall Ability Memory/Recall Ability : That he or she is in a hospital/hospital unit;Current season    Bedside Swallowing Assessment General Diet Prior to this Study: Regular;Thin liquids (Level 0) Temperature Spikes Noted: No Respiratory Status: Room air History of Recent Intubation: No Behavior/Cognition: Alert;Cooperative;Pleasant mood Oral Cavity - Dentition: Poor condition Self-Feeding Abilities: Able to feed self Patient Positioning: Upright in bed Baseline Vocal Quality: Normal Volitional Cough: Strong Volitional Swallow: Able to elicit  Oral Care Assessment Oral Assessment  (WDL): Within Defined Limits Level of Consciousness: Alert Is patient on any of following O2 devices?: None of the  above Nutritional status: No high risk factors Oral Assessment Risk : Low Risk Ice Chips Ice chips: Not tested Thin Liquid Thin Liquid: Within functional limits Nectar Thick Nectar Thick Liquid: Not tested Honey Thick Honey Thick Liquid: Not tested Puree Puree: Not tested Solid Solid: Within functional limits BSE Assessment Risk for Aspiration Impact on safety and function: No limitations Other Related Risk Factors: Previous CVA;History of GERD  Short Term Goals: Week 1: SLP Short Term Goal 1 (Week 1): STGs= LTGs due to ELOS  Refer to Care Plan for Long Term Goals  Recommendations for other services: Neuropsych  Discharge Criteria: Patient will be discharged from SLP if patient  refuses treatment 3 consecutive times without medical reason, if treatment goals not met, if there is a change in medical status, if patient makes no progress towards goals or if patient is discharged from hospital.  The above assessment, treatment plan, treatment alternatives and goals were discussed and mutually agreed upon: by patient  Renaee Munda 02/05/2023, 5:16 PM

## 2023-02-05 NOTE — Evaluation (Signed)
Physical Therapy Assessment and Plan  Patient Details  Name: Roberta Calderon MRN: [id number] Date of Birth: 1970/09/20  PT Diagnosis: Abnormal posture, Abnormality of gait, Difficulty walking, Hemiparesis non-dominant, Impaired cognition, Impaired sensation, and Muscle weakness Rehab Potential: Good ELOS: N/A due to plan for D/C tomorrow home with family support   Today's Date: 02/05/2023 PT Individual Time: 1350-1508 PT Individual Time Calculation (min): 78 min    Hospital Problem: Principal Problem:   Small vessel cerebrovascular accident (CVA) [facility])   Past Medical History:  Past Medical History:  Diagnosis Date   Cocaine abuse (HCC) 08/14/2021   Hypertension    Non-insulin dependent type 2 diabetes mellitus (HCC) 02/01/2023   Stroke (cerebrum) (HCC) 04/10/2022   Past Surgical History: History reviewed. No pertinent surgical history.  Assessment & Plan Clinical Impression: Patient is a 52 y.o. year old female who presented to the [facility] ED on 01/31/2023 complaining of new onset of left sided weakness and slurred speech. Symptoms started on 6/05. She is maintained on Plavix due to prior stroke. PMH significant for hypertension, tobacco use and DM-2. Neurology consulted. MRI brain-small vessel appearing infarct in the white matter on the right; infarct involving the posterior right frontal centrum semiovale and old lacunar infarcts   A1c = 6.5%. EF = 60-65%. No LVO. Out of window for tenecteplase. 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 She states she was independent in ADL and IADL and mother lived in a home behind her home. She is requiring up to min A for ADL and safety with RW.  Labs stable. The patient requires inpatient medicine and rehabilitation evaluations and services for ongoing dysfunction secondary to right frontal centrum semiovale infarct. Patient transferred to CIR on 02/04/2023 .   Patient currently requires supervision with mobility using rollator  secondary to muscle weakness, decreased cardiorespiratoy endurance, decreased attention to left, decreased awareness, decreased safety awareness, and decreased memory, and decreased balance strategies.  Prior to hospitalization, patient was independent  with mobility and lived with Alone in a House home.  Home access is 1Stairs to enter.  Patient will benefit from follow up OP at discharge to address above/below impairments and maximize safe functional mobility, minimize fall risk, and decrease caregiver burden. Recommendation for pt to D/C home with 24 hour supervision.  PT - End of Session Activity Tolerance: Tolerates 30+ min activity with multiple rests Endurance Deficit: Yes Endurance Deficit Description: pt requires seated rest breaks PT Assessment Rehab Potential (ACUTE/IP ONLY): Good PT Barriers to Discharge: Decreased caregiver support PT Patient demonstrates impairments in the following area(s): Balance PT Locomotion Functional Problem(s): Ambulation;Stairs PT Plan PT Duration Estimated Length of Stay: N/A due to plan for D/C tomorrow home with family support PT Treatment/Interventions: Ambulation/gait training;Community reintegration;DME/adaptive equipment instruction;Stair training;UE/LE Strength taining/ROM;Discharge planning;Therapeutic Activities;Disease management/prevention;Functional mobility training;Patient/family education PT Transfers Anticipated Outcome(s): supervision using LRAD as pt D/C home tomorrow PT Locomotion Anticipated Outcome(s): supervision using LRAD as pt D/C home tomorrow PT Recommendation Follow Up Recommendations: Outpatient PT;24 hour supervision/assistance Patient destination: Home Equipment Recommended: Other (comment) Equipment Details: rollator   PT Evaluation Precautions/Restrictions Precautions Precautions: Fall;Other (comment) Precaution Comments: mild L hemi Restrictions Weight Bearing Restrictions: No Pain Pain Assessment Pain Scale:  0-10 Pain Score: 0-No pain Pain Interference Pain Interference Pain Effect on Sleep: 0. Does not apply - I have not had any pain or hurting in the past 5 days Pain Interference with Therapy Activities: 1. Rarely or not at all Pain Interference with Day-to-Day Activities: 1. Rarely  or not at all Home Living/Prior Functioning Home Living Living Arrangements: Other (Comment);Parent (Mother) Available Help at Discharge: Family;Other (Comment);Available PRN/intermittently (per pt her mother works full time) Type of Home: House Home Access: Stairs to enter Secretary/administrator of Steps: 1 Entrance Stairs-Rails: None Home Layout: One level Bathroom Shower/Tub: Engineer, manufacturing systems: Standard Bathroom Accessibility: Yes Additional Comments: lives in a tiny house behind her mom's house  Lives With: Alone Prior Function Level of Independence: Independent with basic ADLs;Independent with homemaking with ambulation;Independent with gait  Able to Take Stairs?: Yes Driving: No Vocation: On disability Vocation Requirements: has cane and RW at home but is requesting to try rollator Vision/Perception  Vision - History Ability to See in Adequate Light: 0 Adequate Vision - Assessment Eye Alignment: Within Functional Limits Ocular Range of Motion: Within Functional Limits Alignment/Gaze Preference: Within Defined Limits Tracking/Visual Pursuits: Able to track stimulus in all quads without difficulty Saccades: Other (comment);Additional eye shifts occurred during testing (during vertical saccades L eye slow to move in when looking down) Convergence: Impaired - to be further tested in functional context (L eye is slow to converge, however no double vision reported) Perception Perception: Impaired Inattention/Neglect: Does not attend to left side of body (mild L inattention) Praxis Praxis: Intact  Cognition  Overall Cognitive Status: No family/caregiver present to determine baseline  cognitive functioning Arousal/Alertness: Awake/alert Orientation Level: Oriented X4 Year: 2024 Month: June Day of Week: Correct Attention: Focused;Sustained Focused Attention: Appears intact Sustained Attention: Appears intact Memory: Appears intact (Pt reports mild memory impairment since CVA- no impairment noted for tasks assessed) Awareness: Appears intact Safety/Judgment: Impaired (mild impairment for higher level dynamic tasks) Sensation Sensation Light Touch: Impaired Detail Central sensation comments: pt reports sensation in L UE is different than in R UE but able to feel sensation in L LE and reports feels same as R LE Light Touch Impaired Details: Impaired LUE Hot/Cold: Not tested Proprioception: Appears Intact Stereognosis: Not tested Coordination Gross Motor Movements are Fluid and Coordinated: No Fine Motor Movements are Fluid and Coordinated: No Coordination and Movement Description: Mild decreased coordination in L hemibody with pt reporting she feels L hand is impacted more than L LE Motor  Motor Motor: Hemiplegia Motor - Skilled Clinical Observations: Mild balance deficit d/t L sided weakeness   Trunk/Postural Assessment  Cervical Assessment Cervical Assessment: Within Functional Limits (slight forward head) Thoracic Assessment Thoracic Assessment: Exceptions to [facility] (slight rounded shoulders) Lumbar Assessment Lumbar Assessment: Within Functional Limits Postural Control Postural Control: Deficits on evaluation (requires RW or rollator for balance)  Balance Balance Balance Assessed: Yes Static Sitting Balance Static Sitting - Balance Support: Feet supported Static Sitting - Level of Assistance: 6: Modified independent (Device/Increase time) Dynamic Sitting Balance Dynamic Sitting - Balance Support: Feet supported;Bilateral upper extremity supported Dynamic Sitting - Level of Assistance: 6: Modified independent (Device/Increase time) Static Standing  Balance Static Standing - Balance Support: During functional activity;Bilateral upper extremity supported Static Standing - Level of Assistance: 5: Stand by assistance Dynamic Standing Balance Dynamic Standing - Balance Support: Bilateral upper extremity supported;During functional activity Dynamic Standing - Level of Assistance: 5: Stand by assistance (CGA) Extremity Assessment  RLE Assessment RLE Assessment: Exceptions to [facility] Active Range of Motion (AROM) Comments: WFL/WNL General Strength Comments: assessed in sitting RLE Strength Right Hip Flexion: 4-/5 Right Knee Flexion: 4+/5 Right Knee Extension: 4+/5 Right Ankle Dorsiflexion: 4+/5 Right Ankle Plantar Flexion: 4+/5 LLE Assessment LLE Assessment: Exceptions to [facility] Active Range of Motion (AROM) Comments: WFL/WNL General Strength  Comments: assessed in sitting LLE Strength Left Hip Flexion: 2+/5 Left Knee Flexion: 4-/5 Left Knee Extension: 3+/5 Left Ankle Dorsiflexion: 4-/5 Left Ankle Plantar Flexion: 4-/5  Care Tool Care Tool Bed Mobility Roll left and right activity   Roll left and right assist level: Independent with assistive device    Sit to lying activity   Sit to lying assist level: Supervision/Verbal cueing    Lying to sitting on side of bed activity   Lying to sitting on side of bed assist level: the ability to move from lying on the back to sitting on the side of the bed with no back support.: Supervision/Verbal cueing     Care Tool Transfers Sit to stand transfer   Sit to stand assist level: Supervision/Verbal cueing Sit to stand assistive device: Walker (rollator)  Chair/bed transfer   Chair/bed transfer assist level: Supervision/Verbal cueing Chair/bed transfer assistive device: Walker;Other (rollator)   Veterinary surgeon transfer assist level: Sales executive Comment: RW    Care Tool Locomotion Ambulation   Assist level:  Supervision/Verbal cueing Assistive device: Rollator Max distance: 251ft  Walk 10 feet activity   Assist level: Supervision/Verbal cueing Assistive device: Rollator   Walk 50 feet with 2 turns activity   Assist level: Supervision/Verbal cueing Assistive device: Rollator  Walk 150 feet activity   Assist level: Supervision/Verbal cueing Assistive device: Rollator  Walk 10 feet on uneven surfaces activity   Assist level: Supervision/Verbal cueing Assistive device: Walker-rolling  Stairs   Assist level: Supervision/Verbal cueing Stairs assistive device: 2 hand rails Max number of stairs: 12  Walk up/down 1 step activity   Walk up/down 1 step (curb) assist level: Supervision/Verbal cueing Walk up/down 1 step or curb assistive device: 2 hand rails  Walk up/down 4 steps activity   Walk up/down 4 steps assist level: Supervision/Verbal cueing Walk up/down 4 steps assistive device: 2 hand rails  Walk up/down 12 steps activity   Walk up/down 12 steps assist level: Supervision/Verbal cueing Walk up/down 12 steps assistive device: 2 hand rails  Pick up small objects from floor   Pick up small object from the floor assist level: Supervision/Verbal cueing Pick up small object from the floor assistive device: rollator  Wheelchair Is the patient using a wheelchair?: No          Wheel 50 feet with 2 turns activity      Wheel 150 feet activity        Refer to Care Plan for Long Term Goals  SHORT TERM GOAL WEEK 1 PT Short Term Goal 1 (Week 1): no STGs set as pt D/Cing home tomorrow  Recommendations for other services: None   Skilled Therapeutic Intervention Per team, pt requesting to D/C home tomorrow; however, she would still like to participate in therapy evaluations and treatment today. Pt received sitting EOB and agreeable to therapy session. Evaluation completed (see details above) with patient education regarding purpose of PT evaluation, D/C planning and follow-up  recommendations, safety awareness at home as well as CIR information including safety plan and fall risk safety. Pt performed the below functional mobility tasks with the specified levels of skilled cuing and assistance.  Therapist educated pt on BE FAST signs/symptoms of a CVA. Calling 911 in event of a fall. The need to keep her cell phone on her at all times in order to call for help if needed.  Started therapy session with CGA for pt safety  using RW; however, pt requesting to trial rollator. With increased mobility pt able to progress from CGA to slight distant supervision level to ensure pt safety due to this being her first full hour of PT since her CVA; however, pt reports feeling confident and comfortable being mod-I using rollator by end of session.  Therapist provided pt with rollator and educated pt on proper use of AD and proper brake management with all functional mobility tasks including how to safely use the AD for seated rest breaks - pt demos understanding with min cuing throughout session.  Therapist educated pt on how to perform safe floor transfer and pt demonstrated understanding with CGA for safety while pivoting hips onto mat.  Pt reports no questions/concerns and states she feels ready to D/C home tomorrow.  At end of session, pt left seated EOB with needs in reach and bed alarm on.  Mobility Bed Mobility Bed Mobility: Supine to Sit;Sit to Supine Rolling Right: Supervision/verbal cueing Rolling Left: Supervision/Verbal cueing Supine to Sit: Independent with assistive device Sit to Supine: Independent with assistive device Transfers Transfers: Sit to Stand;Stand to Sit;Stand Pivot Transfers (CGA progressing to supervision during session as pt increased mobility and motor plan) Sit to Stand: Supervision/Verbal cueing;Contact Guard/Touching assist Stand to Sit: Supervision/Verbal cueing;Contact Guard/Touching assist Stand Pivot Transfers: Supervision/Verbal  cueing;Contact Guard/Touching assist Stand Pivot Transfer Details: Verbal cues for safe use of DME/AE;Verbal cues for technique;Verbal cues for precautions/safety;Verbal cues for sequencing;Visual cues/gestures for sequencing Transfer (Assistive device): Other (Comment) (started with RW progressed to rollator) Locomotion  Gait Ambulation: Yes Gait Assistance: Supervision/Verbal cueing;Contact Guard/Touching assist (started with CGA and quickly progressed to slight distant supervision by end of session) Gait Distance (Feet): 150 Feet (+ 275ft) Assistive device: Rolling walker;Rollator (started with RW and progressed to rollator) Gait Assistance Details: Verbal cues for gait pattern;Verbal cues for precautions/safety;Verbal cues for safe use of DME/AE;Verbal cues for sequencing Gait Gait: Yes Gait Pattern: Impaired Gait Pattern: Step-through pattern;Decreased stride length;Left genu recurvatum;Poor foot clearance - left (L knee snaps back slightly into hyperextension during mid to terminal stance; decreased L foot clearance with fatigue) Gait velocity: decreased but sufficient for household and limited community ambulation Stairs / Additional Locomotion Stairs: Yes Stairs Assistance: Supervision/Verbal cueing Stair Management Technique: Two rails;Alternating pattern;Step to pattern (alternating on ascent and step-to descent) Number of Stairs: 12 Height of Stairs: 6 Ramp: Supervision/Verbal cueing (using RW) Wheelchair Mobility Wheelchair Mobility: No   Discharge Criteria: Patient will be discharged from PT if patient refuses treatment 3 consecutive times without medical reason, if treatment goals not met, if there is a change in medical status, if patient makes no progress towards goals or if patient is discharged from hospital.  The above assessment, treatment plan, treatment alternatives and goals were discussed and mutually agreed upon: by patient  Ginny Forth , PT, DPT, NCS,  CSRS 02/05/2023, 2:40 PM

## 2023-02-05 NOTE — Evaluation (Signed)
Occupational Therapy Assessment and Plan  Patient Details  Name: Roberta Calderon MRN: [id number] Date of Birth: Dec 09, 1970  OT Diagnosis: hemiplegia affecting non-dominant side and muscle weakness (generalized) Rehab Potential: Rehab Potential (ACUTE ONLY): Good ELOS: NA as plan for Pt to d/c tomorrow with family assist; Pt is safe to d/c tomorrow at [facility] with family assist and 24/7 supervision.   Today's Date: 02/05/2023 OT Individual Time: 1000-1114 OT Individual Time Calculation (min): 74 min     Hospital Problem: Principal Problem:   Small vessel cerebrovascular accident (CVA) [facility])   Past Medical History:  Past Medical History:  Diagnosis Date   Cocaine abuse (HCC) 08/14/2021   Hypertension    Non-insulin dependent type 2 diabetes mellitus (HCC) 02/01/2023   Stroke (cerebrum) (HCC) 04/10/2022   Past Surgical History: History reviewed. No pertinent surgical history.  Assessment & Plan Clinical Impression:  Roberta Calderon is a 52 year old female who presented to the [facility] ED on 01/31/2023 complaining of new onset of left sided weakness and slurred speech. Symptoms started on 6/05. She is maintained on Plavix due to prior stroke. PMH significant for hypertension, tobacco use and DM-2. Neurology consulted. MRI brain-small vessel appearing infarct in the white matter on the right; infarct involving the posterior right frontal centrum semiovale and old lacunar infarcts   A1c = 6.5%. EF = 60-65%. No LVO. Out of window for tenecteplase. 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  She states she was independent in ADL and IADL and mother lived in a home behind her home. She is requiring up to min A for ADL and safety with RW.  Labs stable. The patient requires inpatient medicine and rehabilitation evaluations and services for ongoing dysfunction secondary to right frontal centrum semiovale infarct.Patient transferred to CIR on 02/04/2023 .    Patient currently requires  CGA   with basic self-care skills secondary to muscle weakness, decreased cardiorespiratoy endurance, impaired timing and sequencing, abnormal tone, unbalanced muscle activation, and decreased coordination, decreased attention to left, decreased attention, decreased awareness, decreased problem solving, and decreased safety awareness, and central origin.  Prior to hospitalization, patient could complete BADLs and IADls with independent .  Patient will benefit from skilled intervention to decrease level of assist with basic self-care skills, increase independence with basic self-care skills, and increase level of independence with iADL prior to discharge home with care partner.  Anticipate patient will require 24 hour supervision and follow up outpatient.  OT - End of Session Activity Tolerance: Decreased this session Endurance Deficit: Yes OT Assessment Rehab Potential (ACUTE ONLY): Good OT Patient demonstrates impairments in the following area(s): Balance;Behavior;Safety;Sensory;Skin Integrity;Endurance;Motor;Vision;Pain OT Basic ADL's Functional Problem(s): Grooming;Bathing;Dressing;Toileting OT Advanced ADL's Functional Problem(s): Simple Meal Preparation;Laundry OT Transfers Functional Problem(s): Toilet;Tub/Shower OT Additional Impairment(s): Fuctional Use of Upper Extremity OT Plan OT Intensity: Minimum of 1-2 x/day, 45 to 90 minutes OT Frequency: 5 out of 7 days OT Duration/Estimated Length of Stay: less than a week OT Treatment/Interventions: Balance/vestibular training;Discharge planning;Functional electrical stimulation;Pain management;Self Care/advanced ADL retraining;Therapeutic Activities;UE/LE Coordination activities;Cognitive remediation/compensation;Disease mangement/prevention;Patient/family education;Functional mobility training;Skin care/wound managment;Therapeutic Exercise;Visual/perceptual remediation/compensation;Community reintegration;DME/adaptive equipment  instruction;Neuromuscular re-education;Psychosocial support;Splinting/orthotics;UE/LE Strength taining/ROM OT Self Feeding Anticipated Outcome(s): Independent OT Basic Self-Care Anticipated Outcome(s): Supervision OT Toileting Anticipated Outcome(s): Supervision OT Bathroom Transfers Anticipated Outcome(s): Supervision OT Recommendation Patient destination: Home Follow Up Recommendations: Outpatient OT Equipment Recommended: Tub/shower seat   OT Evaluation Precautions/Restrictions  Precautions Precautions: Fall Restrictions Weight Bearing Restrictions: No General Chart Reviewed: Yes Additional Pertinent History: DM-2, HTN,  previous CVA, tobacco use Family/Caregiver Present: No Vital Signs   Pain Pain Assessment Pain Scale: 0-10 Pain Score: 0-No pain Home Living/Prior Functioning Home Living Family/patient expects to be discharged to:: Private residence Living Arrangements: Other relatives Available Help at Discharge: Family, Available 24 hours/day Type of Home: House Home Access: Level entry Home Layout: One level Bathroom Shower/Tub: Armed forces operational officer Accessibility: Yes Additional Comments: lives in house behind [employer] house  Lives With: Alone IADL History Homemaking Responsibilities: Yes Meal Prep Responsibility: Secondary Laundry Responsibility: Primary Cleaning Responsibility: Primary Bill Paying/Finance Responsibility: Primary Shopping Responsibility: No Child Care Responsibility: No Current License: No Mode of Transportation: Family Occupation: On disability Leisure and Hobbies: Watching TV Prior Function Level of Independence: Independent with basic ADLs, Independent with homemaking with ambulation, Independent with gait (recieved assistance for cooking large meals)  Able to Take Stairs?: Yes Driving: No Vocation: On disability Vision Baseline Vision/History: 0 No visual deficits Ability to See in Adequate Light: 0  Adequate Patient Visual Report: No change from baseline Vision Assessment?: Yes Eye Alignment: Within Functional Limits Ocular Range of Motion: Within Functional Limits Alignment/Gaze Preference: Within Defined Limits Tracking/Visual Pursuits: Able to track stimulus in all quads without difficulty Saccades: Within functional limits Convergence: Impaired - to be further tested in functional context (L eye is slow to converge, however no double vision reported) Visual Fields: No apparent deficits Depth Perception: Undershoots Perception  Perception: Impaired Inattention/Neglect: Does not attend to left side of body (mild deficit) Praxis Praxis: Intact Cognition Cognition Overall Cognitive Status: No family/caregiver present to determine baseline cognitive functioning Arousal/Alertness: Awake/alert Orientation Level: Person;Place;Situation Person: Oriented Place: Oriented Situation: Oriented Memory: Appears intact (Pt reports mild memory impairment since CVA- no impairment noted for tasks assessed) Attention: Focused;Sustained Focused Attention: Appears intact Sustained Attention: Appears intact Awareness: Appears intact Problem Solving: Appears intact Executive Function: Decision Making;Self Monitoring Decision Making: Impaired Decision Making Impairment: Verbal basic;Functional basic Self Monitoring: Impaired Self Monitoring Impairment: Functional basic;Verbal basic Behaviors: Impulsive;Poor frustration tolerance (self-limiting) Safety/Judgment: Impaired Brief Interview for Mental Status (BIMS) Repetition of Three Words (First Attempt): 3 Temporal Orientation: Year: Correct Temporal Orientation: Month: Accurate within 5 days Temporal Orientation: Day: Correct Recall: "Sock": Yes, no cue required Recall: "Blue": Yes, no cue required Recall: "Bed": Yes, no cue required BIMS Summary Score: 15 Sensation Sensation Light Touch: Appears Intact Hot/Cold: Not  tested Proprioception: Appears Intact Stereognosis: Not tested Coordination Gross Motor Movements are Fluid and Coordinated: No Fine Motor Movements are Fluid and Coordinated: No Coordination and Movement Description: Mild decreased coordination on LLE Finger Nose Finger Test: Mild dysmetria and ataxia present in LUE with overshooting during assessment Motor  Motor Motor: Hemiplegia Motor - Skilled Clinical Observations: Mild balance deficit d/t L sided weakeness  Trunk/Postural Assessment  Cervical Assessment Cervical Assessment: Exceptions to [facility] (mild forward head) Thoracic Assessment Thoracic Assessment: Exceptions to [facility] (rounded shoudlers) Lumbar Assessment Lumbar Assessment: Within Functional Limits Postural Control Postural Control: Deficits on evaluation (requires RW for blance)  Balance Balance Balance Assessed: Yes Static Sitting Balance Static Sitting - Balance Support: Feet supported Static Sitting - Level of Assistance: 6: Modified independent (Device/Increase time) Dynamic Sitting Balance Dynamic Sitting - Balance Support: Feet supported;Bilateral upper extremity supported Dynamic Sitting - Level of Assistance: 6: Modified independent (Device/Increase time) Static Standing Balance Static Standing - Balance Support: During functional activity;Bilateral upper extremity supported Static Standing - Level of Assistance: 5: Stand by assistance Dynamic Standing Balance Dynamic Standing - Balance Support: Bilateral upper extremity supported;During functional  activity Dynamic Standing - Level of Assistance: 5: Stand by assistance (CGA) Extremity/Trunk Assessment RUE Assessment RUE Assessment: Within Functional Limits LUE Assessment LUE Assessment: Exceptions to [facility] Passive Range of Motion (PROM) Comments: WFL Active Range of Motion (AROM) Comments: WFL General Strength Comments: 3/5, decreased coordination  Care Tool Care Tool Self Care Eating   Eating Assist  Level: Set up assist    Oral Care    Oral Care Assist Level: Contact Guard/Toucning assist    Bathing   Body parts bathed by patient: Right arm;Left arm;Chest;Abdomen;Front perineal area;Buttocks;Right upper leg;Left upper leg;Right lower leg;Left lower leg;Face     Assist Level: Contact Guard/Touching assist    Upper Body Dressing(including orthotics)   What is the patient wearing?: Pull over shirt;Bra   Assist Level: Contact Guard/Touching assis     Lower Body Dressing (excluding footwear)   What is the patient wearing?: Pants;Underwear/pull up Assist for lower body dressing: Contact Guard/Touching assis     Putting on/Taking off footwear   What is the patient wearing?: Socks Assist for footwear: Supervision/Verbal cueing        Care Tool Toileting Toileting activity    Toileting: Contact Guard/Touching assist     Care Tool Bed Mobility Roll left and right activity   Roll left and right assist level: Supervision/Verbal cueing    Sit to lying activity   Sit to lying assist level: Supervision/Verbal cueing    Lying to sitting on side of bed activity   Lying to sitting on side of bed assist level: the ability to move from lying on the back to sitting on the side of the bed with no back support.: Supervision/Verbal cueing     Care Tool Transfers Sit to stand transfer   Sit to stand assist level: Contact Guard/Touching assist    Chair/bed transfer   Chair/bed transfer assist level: Supervision/Verbal cueing     Toilet transfer   Assist Level: Supervision/Verbal cueing     Care Tool Cognition  Expression of Ideas and Wants Expression of Ideas and Wants: 3. Some difficulty - exhibits some difficulty with expressing needs and ideas (e.g, some words or finishing thoughts) or speech is not clear  Understanding Verbal and Non-Verbal Content Understanding Verbal and Non-Verbal Content: 4. Understands (complex and basic) - clear comprehension without cues or  repetitions   Memory/Recall Ability Memory/Recall Ability : That he or she is in a hospital/hospital unit;Current season   Refer to Care Plan for Long Term Goals  SHORT TERM GOAL WEEK 1 OT Short Term Goal 1 (Week 1): NA as Pt plans to d/c home tomorrow with family assist  Recommendations for other services: None    Skilled Therapeutic Intervention Skilled Therapeutic Interventions/Progress Updates:  1:1 OT evaluation and intervention initiated with education provided on OT role, goals, and POC. Pt sitting up in bed reporting 0/10 pain upon OT arrival, receptive to skilled OT session. Pt stating she wishes to go home continuously throughout session. Education provided on IPR purpose and current deficits 2/2 to CVA, however Pt requesting to return home following therapies today with medical team informed of Pt's wishes. Pt able to complete BADLs at levels listed below this session. Pt presenting with balance and LUE coordination and strength deficits with decreased safety awareness noted. Recommending Pt stay for IPR therapies to increase safety and independence in BADL and functional mobility, however Pt is insisting on returning home. Pt is safe to return home at current functional level with 24/7 supervision and family assist with  education provided to Pt on fall prevention, energy conservation, and CVA recovery to increase Pt's safety. DME education provided with Pt recommended to have TTB at d/c. Pt will benefit from follow up OT services in outpatient setting.  ADL ADL Eating: Set up Where Assessed-Eating: Edge of bed Grooming: Contact guard Where Assessed-Grooming: Standing at sink Upper Body Bathing: Supervision/safety Where Assessed-Upper Body Bathing: Shower Lower Body Bathing: Contact guard Where Assessed-Lower Body Bathing: Shower Upper Body Dressing: Supervision/safety Where Assessed-Upper Body Dressing: Edge of bed Lower Body Dressing: Contact guard Where Assessed-Lower Body  Dressing: Edge of bed Toileting: Contact guard Where Assessed-Toileting: Teacher, adult education: Furniture conservator/restorer Method: Proofreader: Engineer, technical sales: Not assessed Film/video editor: Administrator, arts Method: Designer, industrial/product: Press photographer  Bed Mobility Bed Mobility: Rolling Right;Rolling Left;Supine to Sit;Sit to Supine Rolling Right: Supervision/verbal cueing Rolling Left: Supervision/Verbal cueing Supine to Sit: Supervision/Verbal cueing Sit to Supine: Supervision/Verbal cueing Transfers Sit to Stand: Contact Guard/Touching assist Stand to Sit: Contact Guard/Touching assist   Discharge Criteria: Patient will be discharged from OT if patient refuses treatment 3 consecutive times without medical reason, if treatment goals not met, if there is a change in medical status, if patient makes no progress towards goals or if patient is discharged from hospital.  The above assessment, treatment plan, treatment alternatives and goals were discussed and mutually agreed upon: by patient  Army Fossa 02/05/2023, 12:34 PM

## 2023-02-05 NOTE — Progress Notes (Signed)
Inpatient Rehabilitation Center Individual Statement of Services  Patient Name:  Roberta Calderon  Date:  02/05/2023  Welcome to the Inpatient Rehabilitation Center.  Our goal is to provide you with an individualized program based on your diagnosis and situation, designed to meet your specific needs.  With this comprehensive rehabilitation program, you will be expected to participate in at least 3 hours of rehabilitation therapies Monday-Friday, with modified therapy programming on the weekends.  Your rehabilitation program will include the following services:  Physical Therapy (PT), Occupational Therapy (OT), Speech Therapy (ST), 24 hour per day rehabilitation nursing, Therapeutic Recreaction (TR), Neuropsychology, Care Coordinator, Rehabilitation Medicine, Nutrition Services, Pharmacy Services, and Other  Weekly team conferences will be held on Wednesdays to discuss your progress.  Your Inpatient Rehabilitation Care Coordinator will talk with you frequently to get your input and to update you on team discussions.  Team conferences with you and your family in attendance may also be held.  Expected length of stay: 7-10 Days  Overall anticipated outcome: Supervision-mod I   Depending on your progress and recovery, your program may change. Your Inpatient Rehabilitation Care Coordinator will coordinate services and will keep you informed of any changes. Your Inpatient Rehabilitation Care Coordinator's name and contact numbers are listed  below.  The following services may also be recommended but are not provided by the Inpatient Rehabilitation Center:   Home Health Rehabiltiation Services Outpatient Rehabilitation Services    Arrangements will be made to provide these services after discharge if needed.  Arrangements include referral to agencies that provide these services.  Your insurance has been verified to be:   Medicaid of [place] Your primary doctor is:  Gwinda Passe, NP  Pertinent  information will be shared with your doctor and your insurance company.  Inpatient Rehabilitation Care Coordinator:  Lavera Guise, [place] [phone] or [phone]  Information discussed with and copy given to patient by: Andria Rhein, 02/05/2023, 10:33 AM

## 2023-02-05 NOTE — Progress Notes (Signed)
Inpatient Rehabilitation  Patient information reviewed and entered into eRehab system by Tyriq Moragne M. Elise Gladden, M.A., CCC/SLP, PPS Coordinator.  Information including medical coding, functional ability and quality indicators will be reviewed and updated through discharge.    

## 2023-02-05 NOTE — Progress Notes (Signed)
Patient ID: Roberta Calderon, female   DOB: May 31, 1971, 52 y.o.   MRN: [id number]  OP referral sent to Neuro Rehab

## 2023-02-05 NOTE — Progress Notes (Signed)
Inpatient Rehabilitation Care Coordinator Assessment and Plan Patient Details  Name: Roberta Calderon MRN: [id number] Date of Birth: [birth year]  Today's Date: 02/05/2023  Hospital Problems: Principal Problem:   Small vessel cerebrovascular accident (CVA) [facility])  Past Medical History:  Past Medical History:  Diagnosis Date   Cocaine abuse (HCC) 08/14/2021   Hypertension    Non-insulin dependent type 2 diabetes mellitus (HCC) 02/01/2023   Stroke (cerebrum) (HCC) 04/10/2022   Past Surgical History: History reviewed. No pertinent surgical history. Social History:  reports that she has been smoking cigarettes. She has been smoking an average of .25 packs per day. She has never used smokeless tobacco. She reports current alcohol use. She reports that she does not use drugs.  Family / Support Systems Marital Status: Single Patient Roles: Parent Spouse/Significant Other: n/a Children: Roberta Calderon, daughter Other Supports: Mother, [employer] Anticipated Caregiver: Roberta Calderon Ability/Limitations of Caregiver: Supervision Caregiver Availability: 24/7 Family Dynamics: support from mother and daughter  Social History Preferred language: English Religion: [employer] Cultural Background: Independent Education: HS Health Literacy - How often do you need to have someone help you when you read instructions, pamphlets, or other written material from your doctor or pharmacy?: Never Writes: Yes Legal History/Current Legal Issues: n/a Guardian/Conservator: n/a   Abuse/Neglect Abuse/Neglect Assessment Can Be Completed: Yes Physical Abuse: Denies Verbal Abuse: Denies Sexual Abuse: Denies Exploitation of patient/patient's resources: Denies Self-Neglect: Denies  Patient response to: Social Isolation - How often do you feel lonely or isolated from those around you?: Never  Emotional Status Pt's affect, behavior and adjustment status: Patient prefers not to complete CIR program and  requesting d/c ASAP. Recent Psychosocial Issues: hx of smoking, coping Psychiatric History: n/a Substance Abuse History: Cocaine abuse and hx of smoking  Patient / Family Perceptions, Expectations & Goals Pt/Family understanding of illness & functional limitations: yes Premorbid pt/family roles/activities: Independent Anticipated changes in roles/activities/participation: Supervision from mother Pt/family expectations/goals: Supervision  [employer] Agencies: None Premorbid Home Care/DME Agencies: None Transportation available at discharge: Mother or daughter able to transport per patient Is the patient able to respond to transportation needs?: Yes In the past 12 months, has lack of transportation kept you from medical appointments or from getting medications?: No In the past 12 months, has lack of transportation kept you from meetings, work, or from getting things needed for daily living?: No Resource referrals recommended: Neuropsychology  Discharge Planning Living Arrangements: Other (Comment), Parent (Mother) Support Systems: Parent, Children Type of Residence: Private residence Insurance Resources: Medicaid (specify county) Surveyor, quantity Resources: Family Support Financial Screen Referred: No Living Expenses: Psychologist, sport and exercise Management: Patient Does the patient have any problems obtaining your medications?: No Home Management: Independent Patient/Family Preliminary Plans: Plans to continue or mother able to assist Care Coordinator Barriers to Discharge: Medication compliance, Lack of/limited family support, Insurance for SNF coverage, Decreased caregiver support Care Coordinator Anticipated Follow Up Needs: HH/OP Expected length of stay: 7-10 Days  Clinical Impression Sw met with patient, introduced self and explained role. Patient requesting to discharge home ASAP. Patient expressed she prefers not to complete the program.  Patient has agreed with SW to finish  today with CIR and discuss d/c tomorrow. Sw discussed with team. Patient discharging home tomorrow. Patient reports her mother or daughter will be able to pick her up tomorrow. No additional questions or concerns.   Andria Rhein 02/05/2023, 12:57 PM

## 2023-02-05 NOTE — Progress Notes (Addendum)
Patient ID: Roberta Calderon, female   DOB: 08-22-1971, 52 y.o.   MRN: [id number]  Patient has confirmed transportation for tomorrow. Mother will be here between 9 AM- 11 AM. Patient prefers to use [facility] pharmacy. No additional questions or concerns.   Detailed VM left with pt's mother. SW will wait for FU

## 2023-02-05 NOTE — Progress Notes (Signed)
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 Recent Labs    02/04/23 1544 02/05/23 0603  WBC 6.3 6.8  HGB 13.1 12.8  HCT 43.2 40.8  PLT 342 335   Recent Labs    02/04/23 1544 02/05/23 0603  NA  --  136  K  --  3.8  CL  --  103  CO2  --  22  GLUCOSE  --  119*  BUN  --  18  CREATININE 0.98 0.87  CALCIUM  --  9.4    Intake/Output Summary (Last 24 hours) at 02/05/2023 0740 Last data filed at 02/04/2023 1812 Gross per 24 hour  Intake 180 ml  Output 200 ml  Net -20 ml        Physical Exam: Vital Signs Blood pressure 119/78, pulse 74, temperature 98.3 F (36.8 C), temperature source Oral, resp. rate 16, height 5\' 2"  (1.575 m), weight 65.2 kg, SpO2 95 %.   General: No acute distress Mood and affect are appropriate Heart: Regular rate and rhythm no rubs murmurs or extra sounds Lungs: Clear to auscultation, breathing unlabored, no rales or wheezes Abdomen: Positive bowel sounds, soft nontender to palpation, nondistended Extremities: No clubbing, cyanosis, or edema Skin: No evidence of breakdown, no evidence of rash Neurologic: Cranial nerves II through XII intact, motor strength is 5/5 in RIght and 4/5 left  deltoid, bicep, tricep, grip, hip flexor, knee extensors, ankle dorsiflexor and plantar flexor Sensory exam normal sensation to light touch and proprioception in bilateral upper and lower extremities Cerebellar exam mild dysmetria bilateral FNF  Musculoskeletal: Full range of motion in all 4 extremities. No joint swelling   Assessment/Plan: 1. Functional deficits which require 3+ hours per day of interdisciplinary therapy in a comprehensive inpatient rehab setting. Physiatrist is providing close team  supervision and 24 hour management of active medical problems listed below. Physiatrist and rehab team continue to assess barriers to discharge/monitor patient progress toward functional and medical goals  Care Tool:  Bathing              Bathing assist       Upper Body Dressing/Undressing Upper body dressing        Upper body assist      Lower Body Dressing/Undressing Lower body dressing            Lower body assist       Toileting Toileting    Toileting assist       Transfers Chair/bed transfer  Transfers assist           Locomotion Ambulation   Ambulation assist              Walk 10 feet activity   Assist           Walk 50 feet activity   Assist           Walk 150 feet activity   Assist  Walk 10 feet on uneven surface  activity   Assist           Wheelchair     Assist               Wheelchair 50 feet with 2 turns activity    Assist            Wheelchair 150 feet activity     Assist          Blood pressure 119/78, pulse 74, temperature 98.3 F (36.8 C), temperature source Oral, resp. rate 16, height 5\' 2"  (1.575 m), weight 65.2 kg, SpO2 95 %.  Medical Problem List and Plan: 1. Functional deficits secondary to RIght frontal infarcts, prior hx Left BG infarct 02/04/22  , left frontal operculum microhemorrhage 08/17/21 I[facility] 02/06/21, Right thalamic infarct  2021 Has left hemiparesis and bilateral dysmetria in UEs              -patient may  shower             -ELOS/Goals: 7-10d Sup goals   2.  Antithrombotics: -DVT/anticoagulation:  Pharmaceutical: Lovenox             -antiplatelet therapy: Plavix    3. Pain Management: Tylenol as needed   4. Mood/Behavior/Sleep: LCSW to evaluate and provide emotional support             -antipsychotic agents: n/a             -continue home trazodone 25-50 mg q HS   5. Neuropsych/cognition: This patient is capable of making  decisions on her own behalf.   6. Skin/Wound Care: Routine skin care checks   7. Fluids/Electrolytes/Nutrition: Routine Is and Os and follow-up chemistries   8: Hypertension: monitor TID and prn (home meds>>losartan 50 mg daily and HCTZ 25 mg daily)             -continue losartan 50 mg daily Vitals:   02/04/23 1847 02/04/23 1847  BP: 119/78 119/78  Pulse: 74 74  Resp: 16 16  Temp: 98.3 F (36.8 C) 98.3 F (36.8 C)  SpO2: 95% 95%      9: Hyperlipidemia: continue statin   10: DM-2: CBGs QID; carb modified; A1c = 6.5%; home meds>>metformin 500 mg daily -continue SSI (currently not requiring coverage) CBG (last 3)  Recent Labs    02/04/23 1259 02/04/23 1643 02/05/23 0635  GLUCAP 129* 129* 113*   May d/c   11: Tobacco use: cessation counseling,             -continue nicotine patch   12.  PMH cocaine abuse [facility] cocaine detox admit 08/15/21,02/07/21 UDS + , pt states she has not used since 2021, no UDS in ED this admit or in 2023- ask neuropsych to eval  LOS: 1 days A FACE TO FACE EVALUATION WAS PERFORMED  Erick Colace 02/05/2023, 7:40 AM

## 2023-02-05 NOTE — Progress Notes (Signed)
Inpatient Rehabilitation Care Coordinator Discharge Note   Patient Details  Name: Roberta Calderon MRN: [id number] Date of Birth: [birth year]   Discharge location: Home  Length of Stay: 2 Days  Discharge activity level: Supervision overall  Home/community participation: Mother and daughter  Patient response OZ:DGUYQI Literacy - How often do you need to have someone help you when you read instructions, pamphlets, or other written material from your doctor or pharmacy?: Never  Patient response HK:VQQVZD Isolation - How often do you feel lonely or isolated from those around you?: Never  Services provided included: MD, PT, RD, OT, SLP, RN, CM, TR, Neuropsych, Pharmacy, SW  Financial Services:       Choices offered to/list presented to: patient  Follow-up services arranged:  Outpatient, DME    Outpatient Servicies: [facility] PT OT SLP DME : Rollator and TTB    Patient response to transportation need: Is the patient able to respond to transportation needs?: Yes In the past 12 months, has lack of transportation kept you from medical appointments or from getting medications?: No In the past 12 months, has lack of transportation kept you from meetings, work, or from getting things needed for daily living?: No   Patient/Family verbalized understanding of follow-up arrangements:  Yes  Individual responsible for coordination of the follow-up plan: Mother or daughter  Confirmed correct DME delivered: Andria Rhein 02/05/2023    Comments (or additional information):  Summary of Stay    Date/Time Discharge Planning CSW  02/05/23 1415 Patient discharging home tomorrow by request. Pt to have supervision from her mother and daughter. CJB       Andria Rhein

## 2023-02-05 NOTE — Discharge Instructions (Addendum)
Inpatient Rehab Discharge Instructions  Roberta Calderon Discharge date and time:  02/06/2023  Activities/Precautions/ Functional Status: Activity: no lifting, driving, or strenuous exercise until cleared by MD Diet: diabetic diet Wound Care: none needed Functional status:  ___ No restrictions     ___ Walk up steps independently ___ 24/7 supervision/assistance   ___ Walk up steps with assistance _x__ Intermittent supervision/assistance  ___ Bathe/dress independently ___ Walk with walker     ___ Bathe/dress with assistance ___ Walk Independently    ___ Shower independently ___ Walk with assistance    __x_ Shower with assistance __x_ No alcohol     ___ Return to work/school ________  Special Instructions: No driving, alcohol consumption or tobacco use.  Recommend daily BP measurement in same arm and record time of day. Bring this information with you to follow-up appointment with PCP.   COMMUNITY REFERRALS UPON DISCHARGE:     Outpatient: PT     OT    ST                Agency:[facility] [phone]               Appointment Date/Time: TBD  Medical Equipment/Items Ordered: Rollator and Tub Transfer Bench                                                 Agency/Supplier: [employer] [phone]  Disability office contact information: [phone]  STROKE/TIA DISCHARGE INSTRUCTIONS SMOKING Cigarette smoking nearly doubles your risk of having a stroke & is the single most alterable risk factor  If you smoke or have smoked in the last 12 months, you are advised to quit smoking for your health. Most of the excess cardiovascular risk related to smoking disappears within a year of stopping. Ask you doctor about anti-smoking medications [place] Quit Line: 1-800-QUIT NOW Free Smoking Cessation Classes [phone]  CHOLESTEROL Know your levels; limit fat & cholesterol in your diet  Lipid Panel     Component Value Date/Time   CHOL 220 (H) 02/01/2023 0449   CHOL 208 (H)  07/30/2022 0940   TRIG 90 02/01/2023 0449   HDL 49 02/01/2023 0449   HDL 57 07/30/2022 0940   CHOLHDL 4.5 02/01/2023 0449   VLDL 18 02/01/2023 0449   LDLCALC 153 (H) 02/01/2023 0449   LDLCALC 133 (H) 07/30/2022 0940     Many patients benefit from treatment even if their cholesterol is at goal. Goal: Total Cholesterol (CHOL) less than 160 Goal:  Triglycerides (TRIG) less than 150 Goal:  HDL greater than 40 Goal:  LDL (LDLCALC) less than 100   BLOOD PRESSURE American Stroke Association blood pressure target is less that 120/80 mm/Hg  Your discharge blood pressure is:  BP: 117/75 Monitor your blood pressure Limit your salt and alcohol intake Many individuals will require more than one medication for high blood pressure  DIABETES (A1c is a blood sugar average for last 3 months) Goal HGBA1c is under 7% (HBGA1c is blood sugar average for last 3 months)  Diabetes: Diagnosis of diabetes:  Your A1c:6.5 %    Lab Results  Component Value Date   HGBA1C 6.5 (H) 02/01/2023    Your HGBA1c can be lowered with medications, healthy diet, and exercise. Check your blood sugar as directed by your physician Call your physician if you experience unexplained or low blood sugars.  PHYSICAL ACTIVITY/REHABILITATION Goal is 30 minutes at least 4 days per week  Activity: Increase activity slowly, Therapies: Physical Therapy: Outpatient, Occupational Therapy: Outpatient, and Speech Therapy: Outpatient Return to work: when cleared by MD Activity decreases your risk of heart attack and stroke and makes your heart stronger.  It helps control your weight and blood pressure; helps you relax and can improve your mood. Participate in a regular exercise program. Talk with your doctor about the best form of exercise for you (dancing, walking, swimming, cycling).  DIET/WEIGHT Goal is to maintain a healthy weight  Your discharge diet is:  Diet Order             Diet Carb Modified Fluid consistency: Thin; Room  service appropriate? Yes  Diet effective now                  thin liquids Your height is:  Height: 5\' 2"  (157.5 cm) Your current weight is: Weight: 65.2 kg Your Body Mass Index (BMI) is:  BMI (Calculated): 26.28 Following the type of diet specifically designed for you will help prevent another stroke. Your goal weight range is:   Your goal Body Mass Index (BMI) is 19-24. Healthy food habits can help reduce 3 risk factors for stroke:  High cholesterol, hypertension, and excess weight.  RESOURCES Stroke/Support Group:  Call [phone]   STROKE EDUCATION PROVIDED/REVIEWED AND GIVEN TO PATIENT Stroke warning signs and symptoms How to activate emergency medical system (call 911). Medications prescribed at discharge. Need for follow-up after discharge. Personal risk factors for stroke. Pneumonia vaccine given: No Flu vaccine given: No My questions have been answered, the writing is legible, and I understand these instructions.  I will adhere to these goals & educational materials that have been provided to me after my discharge from the hospital.     My questions have been answered and I understand these instructions. I will adhere to these goals and the provided educational materials after my discharge from the hospital.  Patient/Caregiver Signature _______________________________ Date __________  Clinician Signature _______________________________________ Date __________  Please bring this form and your medication list with you to all your follow-up doctor's appointments.

## 2023-02-05 NOTE — Patient Care Conference (Incomplete)
Inpatient RehabilitationTeam Conference and Plan of Care Update Date: 02/06/2023   Time: 13:21 PM    Patient Name: Roberta Calderon      Medical Record Number: [id number]  Date of Birth: Oct 06, 1970 Sex: Female         Room/Bed: 4M03C/4M03C-01 Payor Info: Payor: MEDICAID [place] / Plan: MEDICAID [place] ACCESS / Product Type: *No Product type* /    Admit Date/Time:  02/04/2023  3:04 PM  Primary Diagnosis:  Small vessel cerebrovascular accident (CVA) [facility])  Hospital Problems: Principal Problem:   Small vessel cerebrovascular accident (CVA) [facility])    Expected Discharge Date: Expected Discharge Date: 02/06/23  Team Members Present: Physician leading conference: Dr. Claudette Laws Social Worker Present: Lavera Guise, BSW Nurse Present: Chana Bode, RN PT Present: Casimiro Needle, PT OT Present: Bonnell Public, OT SLP Present: Pablo Lawrence, SLP     Current Status/Progress Goal Weekly Team Focus  Bowel/Bladder      Continent of bowel and bladder. UA for urgency,    continent    Reinforce double voiding and heed urges to void.  Swallow/Nutrition/ Hydration               ADL's   CGA over all LB BADLs, supervision UB BADLs, CGA for toilet and shower transfers using RW   NA as Pt planning to d/c home tomorrow with no LOS set   eval, d/c planning, Pt education, BADL retraining, DME education    Mobility   mod-I bed mobility, supervision sit<>stand and stand pivots using rollator, supervision ambulation >271ft using rollator, supervision stair navigation using B HRs   pt planning to D/C home tomorrow with no LTGs set  eval and D/C education/planning, pt education on increased safety awareness at home, education on AD recommendation and follow-up therapy recommendations, education on fall risk and calling 911    Communication   mild dysarthria   Pt also presents with mild flaccid Dysarthria  imprecise articulation and decreased speech intelligibility. Pt was 100% intelligible  with words and phrases and 80% intelligible in sentences and conversation.  mod I  Anticipate pt will achieve mod I for communication with no further skilled ST intervention warranted.  pt/ careiver education, implementation of compensatory strategies  Pt would benefit from skilled SLP services to maximize dysarthria in order to maximize her independence prior to discharge.   Safety/Cognition/ Behavioral Observations  declined formal cognitive assessment, informally observed poor awareness and impulsivity   complete formal cognitive assessment   pt/ caregiver education, implementation of compensatory strategies.    Pain      N/a          Skin      N/a           Discharge Planning:  Patient discharging home tomorrow by request. Pt to have supervision from her mother and daughter.   Team Discussion: Patient is doing well post CVA with impulsivity.  Patient on target to meet rehab goals: yes  *See Care Plan and progress notes for long and short-term goals.   Revisions to Treatment Plan:  N/a  Teaching Needs: Safety, medications, smoking cessation, dietary modifications, etc.   Current Barriers to Discharge: Decreased caregiver support and Home enviroment access/layout  Possible Resolutions to Barriers: Op follow up services     Medical Summary Current Status: recurrent CVA, ongoing tobacco abuse  Barriers to Discharge: Self-care education   Possible Resolutions to Barriers/Weekly Focus: multiple discussions regarding risk factor mitigation   Continued Need for Acute Rehabilitation Level of Care:  The patient requires daily medical management by a physician with specialized training in physical medicine and rehabilitation for the following reasons: Direction of a multidisciplinary physical rehabilitation program to maximize functional independence : Yes Medical management of patient stability for increased activity during participation in an intensive rehabilitation  regime.: Yes Analysis of laboratory values and/or radiology reports with any subsequent need for medication adjustment and/or medical intervention. : Yes   I attest that I was present, lead the team conference, and concur with the assessment and plan of the team.   Chana Bode B 02/06/2023, 10:52 AM

## 2023-02-06 ENCOUNTER — Other Ambulatory Visit (HOSPITAL_COMMUNITY): Payer: Self-pay

## 2023-02-06 DIAGNOSIS — I639 Cerebral infarction, unspecified: Secondary | ICD-10-CM | POA: Diagnosis not present

## 2023-02-06 MED ORDER — ATORVASTATIN CALCIUM 80 MG PO TABS
80.0000 mg | ORAL_TABLET | Freq: Every day | ORAL | 0 refills | Status: DC
  Filled 2023-02-06: qty 30, 30d supply, fill #0

## 2023-02-06 MED ORDER — LOSARTAN POTASSIUM 50 MG PO TABS
50.0000 mg | ORAL_TABLET | Freq: Every day | ORAL | 0 refills | Status: DC
  Filled 2023-02-06: qty 30, 30d supply, fill #0

## 2023-02-06 MED ORDER — CLOPIDOGREL BISULFATE 75 MG PO TABS
75.0000 mg | ORAL_TABLET | Freq: Every day | ORAL | 0 refills | Status: DC
  Filled 2023-02-06: qty 30, 30d supply, fill #0

## 2023-02-06 MED ORDER — NICOTINE 21 MG/24HR TD PT24
21.0000 mg | MEDICATED_PATCH | Freq: Every day | TRANSDERMAL | 0 refills | Status: DC
  Filled 2023-02-06: qty 28, 28d supply, fill #0

## 2023-02-06 MED ORDER — ACETAMINOPHEN 325 MG PO TABS: 650.0000 mg | ORAL_TABLET | Freq: Four times a day (QID) | ORAL | Status: AC | PRN

## 2023-02-06 MED ORDER — TRAZODONE HCL 50 MG PO TABS
25.0000 mg | ORAL_TABLET | Freq: Every day | ORAL | 0 refills | Status: DC
Start: 2023-02-06 — End: 2023-06-25
  Filled 2023-02-06: qty 30, 30d supply, fill #0

## 2023-02-06 NOTE — Progress Notes (Signed)
Patient has had a stroke and requires supervision but does not have a legal guardian to my knowledge or our social worker's knowledge. Family feels she's competent and refused up come up for discharge instructions review.

## 2023-02-06 NOTE — Progress Notes (Signed)
PROGRESS NOTE   Subjective/Complaints:  Discussed stroke prevention , denies ongoing cocaine abuse, we discussed use of nicoderm patch for smoking cessation   ROS- neg CP, SOB, N/V/D   Objective:   No results found. Recent Labs    02/04/23 1544 02/05/23 0603  WBC 6.3 6.8  HGB 13.1 12.8  HCT 43.2 40.8  PLT 342 335    Recent Labs    02/04/23 1544 02/05/23 0603  NA  --  136  K  --  3.8  CL  --  103  CO2  --  22  GLUCOSE  --  119*  BUN  --  18  CREATININE 0.98 0.87  CALCIUM  --  9.4     Intake/Output Summary (Last 24 hours) at 02/06/2023 0726 Last data filed at 02/05/2023 1817 Gross per 24 hour  Intake 720 ml  Output --  Net 720 ml         Physical Exam: Vital Signs Blood pressure 117/75, pulse 82, temperature 97.9 F (36.6 C), temperature source Oral, resp. rate 16, height 5\' 2"  (1.575 m), weight 65.2 kg, SpO2 100 %.   General: No acute distress Mood and affect are appropriate Heart: Regular rate and rhythm no rubs murmurs or extra sounds Lungs: Clear to auscultation, breathing unlabored, no rales or wheezes Abdomen: Positive bowel sounds, soft nontender to palpation, nondistended Extremities: No clubbing, cyanosis, or edema Skin: No evidence of breakdown, no evidence of rash Neurologic: Cranial nerves II through XII intact, motor strength is 5/5 in RIght and 4/5 left  deltoid, bicep, tricep, grip, hip flexor, knee extensors, ankle dorsiflexor and plantar flexor Sensory exam normal sensation to light touch and proprioception in bilateral upper and lower extremities Cerebellar exam mild dysmetria bilateral FNF  Musculoskeletal: Full range of motion in all 4 extremities. No joint swelling   Assessment/Plan: 1. Functional deficits due to RIght frontal infarcts  Stable for D/C today F/u PCP in 1-2 weeks F/u PM&R 3-4 weeks F/u Neuro 1-2 mo  See D/C summary See D/C instructions   Care  Tool:  Bathing    Body parts bathed by patient: Right arm, Left arm, Chest, Abdomen, Front perineal area, Buttocks, Right upper leg, Left upper leg, Right lower leg, Left lower leg, Face         Bathing assist Assist Level: Contact Guard/Touching assist     Upper Body Dressing/Undressing Upper body dressing   What is the patient wearing?: Pull over shirt, Bra    Upper body assist Assist Level: Contact Guard/Touching assist    Lower Body Dressing/Undressing Lower body dressing      What is the patient wearing?: Pants, Underwear/pull up     Lower body assist Assist for lower body dressing: Contact Guard/Touching assist     Toileting Toileting    Toileting assist Assist for toileting: Contact Guard/Touching assist     Transfers Chair/bed transfer  Transfers assist     Chair/bed transfer assist level: Supervision/Verbal cueing Chair/bed transfer assistive device: Walker, Other (rollator)   Locomotion Ambulation   Ambulation assist      Assist level: Supervision/Verbal cueing Assistive device: Rollator Max distance: 22ft   Walk 10 feet activity  Assist     Assist level: Supervision/Verbal cueing Assistive device: Rollator   Walk 50 feet activity   Assist    Assist level: Supervision/Verbal cueing Assistive device: Rollator    Walk 150 feet activity   Assist    Assist level: Supervision/Verbal cueing Assistive device: Rollator    Walk 10 feet on uneven surface  activity   Assist     Assist level: Supervision/Verbal cueing Assistive device: Walker-rolling   Wheelchair     Assist Is the patient using a wheelchair?: No             Wheelchair 50 feet with 2 turns activity    Assist            Wheelchair 150 feet activity     Assist          Blood pressure 117/75, pulse 82, temperature 97.9 F (36.6 C), temperature source Oral, resp. rate 16, height 5\' 2"  (1.575 m), weight 65.2 kg, SpO2 100  %.  Medical Problem List and Plan: 1. Functional deficits secondary to RIght frontal infarcts, prior hx Left BG infarct 02/04/22  , left frontal operculum microhemorrhage 08/17/21 ICH 02/06/21, Right thalamic infarct  2021 Has left hemiparesis and bilateral dysmetria in UEs              -patient may  shower             -ELOS/Goals: stable for d/c today    2.  Antithrombotics: -DVT/anticoagulation:  Pharmaceutical: Lovenox             -antiplatelet therapy: Plavix    3. Pain Management: Tylenol as needed   4. Mood/Behavior/Sleep: LCSW to evaluate and provide emotional support             -antipsychotic agents: n/a             -continue home trazodone 25-50 mg q HS   5. Neuropsych/cognition: This patient is capable of making decisions on her own behalf.   6. Skin/Wound Care: Routine skin care checks   7. Fluids/Electrolytes/Nutrition: Routine Is and Os and follow-up chemistries   8: Hypertension: monitor TID and prn (home meds>>losartan 50 mg daily and HCTZ 25 mg daily)             -continue losartan 50 mg daily Vitals:   02/05/23 1949 02/06/23 0537  BP: 116/72 117/75  Pulse: 81 82  Resp: 16 16  Temp: 98.3 F (36.8 C) 97.9 F (36.6 C)  SpO2: 100% 100%      9: Hyperlipidemia: continue statin   10: DM-2: CBGs QID; carb modified; A1c = 6.5%; home meds>>metformin 500 mg daily -continue SSI (currently not requiring coverage) CBG (last 3)  Recent Labs    02/05/23 0635 02/05/23 1123 02/05/23 1641  GLUCAP 113* 148* 109*    May d/c   11: Tobacco use: cessation counseling,             -continue nicotine patch   12.  PMH cocaine abuse [facility] cocaine detox admit 08/15/21,02/07/21 UDS + , pt states she has not used since 2021, no UDS in ED this admit or in 2023- ask neuropsych to eval  LOS: 2 days A FACE TO FACE EVALUATION WAS PERFORMED  Erick Colace 02/06/2023, 7:26 AM

## 2023-02-06 NOTE — Progress Notes (Signed)
Patient DOES NOT HAVE A LEGAL GUARDIAN and

## 2023-02-06 NOTE — Progress Notes (Signed)
Patient has had a stroke and does not have a legal guardian.

## 2023-02-06 NOTE — Progress Notes (Signed)
Inpatient Rehabilitation Discharge Medication Review by a Pharmacist  A complete drug regimen review was completed for this patient to identify any potential clinically significant medication issues.  High Risk Drug Classes Is patient taking? Indication by Medication  Antipsychotic No   Anticoagulant No   Antibiotic No   Opioid No   Antiplatelet Yes Clopidogrel - CVA  Hypoglycemics/insulin No   Vasoactive Medication Yes Losartan - BP  Chemotherapy No   Other Yes Trazodone - sleep Atorvastatin - HLD     Type of Medication Issue Identified Description of Issue Recommendation(s)  Drug Interaction(s) (clinically significant)     Duplicate Therapy     Allergy     No Medication Administration End Date     Incorrect Dose     Additional Drug Therapy Needed     Significant med changes from prior encounter (inform family/care partners about these prior to discharge).    Other       Clinically significant medication issues were identified that warrant physician communication and completion of prescribed/recommended actions by midnight of the next day:  No  Name of provider notified for urgent issues identified:   Provider Method of Notification:     Pharmacist comments: None  Time spent performing this drug regimen review (minutes):  20 minutes  Okey Regal, PharmD

## 2023-02-06 NOTE — IPOC Note (Signed)
Overall Plan of Care [facility]) Patient Details Name: Roberta Calderon MRN: [id number] DOB: [birth year]  Admitting Diagnosis: Small vessel cerebrovascular accident (CVA) [facility])  Hospital Problems: Principal Problem:   Small vessel cerebrovascular accident (CVA) (HCC)     Functional Problem List: Nursing Safety, Endurance, Medication Management  PT Balance  OT Balance, Safety, Sensory, Endurance, Motor, Pain  SLP Behavior, Cognition, Motor  TR         Basic ADL's: OT Grooming, Bathing, Dressing, Toileting     Advanced  ADL's: OT Laundry, Simple Meal Preparation     Transfers: PT    OT Toilet, Tub/Shower     Locomotion: PT Ambulation, Stairs     Additional Impairments: OT Fuctional Use of Upper Extremity  SLP Communication      TR      Anticipated Outcomes Item Anticipated Outcome  Self Feeding Set-up  Swallowing      Basic self-care  CGA as Pt plans to d/c tomorrow  Toileting  CGA as Pt plans to d/c tomorrow   Bathroom Transfers CGA as Pt plans to d/c tomorrow  Bowel/Bladder  n/a  Transfers  supervision using LRAD as pt D/C home tomorrow  Locomotion  supervision using LRAD as pt D/C home tomorrow  Communication  mod I  Cognition     Pain  n/a  Safety/Judgment  manage w cues   Therapy Plan: PT Duration Estimated Length of Stay: N/A due to plan for D/C tomorrow home with family support OT Intensity:  (NA d/t Pt plan to d/c tomorrow) OT Frequency:  (NA as Pt plans to d/c tomorrow) OT Duration/Estimated Length of Stay: NA as plan for Pt to d/c tomorrow with family assist; Pt is safe to d/c tomorrow at [facility] with family assist and 24/7 supervision. SLP Intensity: Minumum of 1-2 x/day, 30 to 90 minutes SLP Frequency: 1 to 3 out of 7 days SLP Duration/Estimated Length of Stay: N/A as pt plans to d/c tomorrow with family assist; Pt is safe to d/c tomorrow with family assist and 24/7 supervision.   Team Interventions: Nursing Interventions Patient/Family  Education, Medication Management, Discharge Planning, Disease Management/Prevention  PT interventions Ambulation/gait training, Community reintegration, DME/adaptive equipment instruction, Stair training, UE/LE Strength taining/ROM, Discharge planning, Therapeutic Activities, Disease management/prevention, Functional mobility training, Patient/family education  OT Interventions Balance/vestibular training, Discharge planning, Self Care/advanced ADL retraining, Therapeutic Activities, Patient/family education, Functional mobility training, Neuromuscular re-education  SLP Interventions Functional tasks, Oral motor exercises, Internal/external aids, Patient/family education, Speech/Language facilitation, Therapeutic Exercise, Therapeutic Activities  TR Interventions    SW/CM Interventions Discharge Planning, Psychosocial Support, Patient/Family Education, Disease Management/Prevention   Barriers to Discharge MD  Medication compliance and substance abuse  Nursing Lack of/limited family support, Medication compliance 1 level/level entry solo; mother lives behind her home; min assist for ADLs PTA/safety, dtr to assist prn  PT Decreased caregiver support    OT      SLP Lack of/limited family support, Medication compliance, Behavior    SW Medication compliance, Lack of/limited family support, Insurance for SNF coverage, Decreased caregiver support     Team Discharge Planning: Destination: PT-Home ,OT- Home , SLP-Home Projected Follow-up: PT-Outpatient PT, 24 hour supervision/assistance, OT-  Outpatient OT, SLP-24 hour supervision/assistance, Outpatient SLP Projected Equipment Needs: PT-Other (comment), OT- Tub/shower bench, SLP-None recommended by SLP Equipment Details: PT-rollator, OT-  Patient/family involved in discharge planning: PT- Patient,  OT-Patient, SLP-Patient  MD ELOS: 2 to 3 days Medical Rehab Prognosis:  Good Assessment: The patient has been admitted for CIR therapies with  the  diagnosis of small vessel CVA. The team will be addressing functional mobility, strength, stamina, balance, safety, adaptive techniques and equipment, self-care, bowel and bladder mgt, patient and caregiver education, secondary stroke prevention teaching. Goals have been set at modified independent. Anticipated discharge destination is home with family.        See Team Conference Notes for weekly updates to the plan of care

## 2023-02-07 ENCOUNTER — Other Ambulatory Visit (HOSPITAL_COMMUNITY): Payer: Self-pay

## 2023-02-07 ENCOUNTER — Telehealth (INDEPENDENT_AMBULATORY_CARE_PROVIDER_SITE_OTHER): Payer: Self-pay

## 2023-02-07 ENCOUNTER — Ambulatory Visit (INDEPENDENT_AMBULATORY_CARE_PROVIDER_SITE_OTHER): Payer: Self-pay

## 2023-02-07 NOTE — Telephone Encounter (Signed)
  Chief Complaint: did not get Plavix or Metformin- Called [facility] and will have to research to know if picked up. Metformin look like it was discontinued  Disposition: [] ED /[] Urgent Care (no appt availability in office) / [] Appointment(In office/virtual)/ []  [place] [facility]/ [] Home Care/ [] Refused Recommended Disposition /[] [place] [facility]/ [x]  Follow-up with PCP Additional Notes: could not reach pt after 3 attempts. Reason for Disposition  Third attempt to contact caller AND no contact made. Phone number verified.  Answer Assessment - Initial Assessment Questions 1. NAME of MEDICINE: "What medicine(s) are you calling about?"     Plavix and Metformin  2. QUESTION: "What is your question?" (e.g., double dose of medicine, side effect)      3. PRESCRIBER: "Who prescribed the medicine?" Reason: if prescribed by specialist, call should be referred to that group.     *No Answer* 4. SYMPTOMS: "Do you have any symptoms?" If Yes, ask: "What symptoms are you having?"  "How bad are the symptoms (e.g., mild, moderate, severe)     *No Answer* 5. PREGNANCY:  "Is there any chance that you are pregnant?" "When was your last menstrual period?"     *No Answer*  Protocols used: Medication Question Call-A-AH, No Contact or Duplicate Contact Call-A-AH

## 2023-02-07 NOTE — Telephone Encounter (Signed)
Message from Glean Salen sent at 02/07/2023  1:03 PM EDT  Summary: med ?   Pt called in states, when she was discharged from [facility], she didn't receive he rmedicines, on to prevent stroke and the other for diabetes. She wasn't sure what the names of them are.

## 2023-02-07 NOTE — Transitions of Care (Post Inpatient/ED Visit) (Signed)
   02/07/2023  Name: ANGELE WIEMANN MRN: [id number] DOB: 06/04/1971  Today's TOC FU Call Status: Today's TOC FU Call Status:: Unsuccessul Call (1st Attempt) Unsuccessful Call (1st Attempt) Date: 02/07/23  Attempted to reach the patient regarding the most recent Inpatient/ED visit.  Follow Up Plan: Additional outreach attempts will be made to reach the patient to complete the Transitions of Care (Post Inpatient/ED visit) call.   Signature   Woodfin Ganja LPN [facility] Nurse Health Advisor Direct Dial [phone]

## 2023-02-07 NOTE — Telephone Encounter (Signed)
Pt called in states, when she was discharged from [facility], she didn't receive he rmedicines, on to prevent stroke and the other for diabetes. She wasn't sure what the names of them are.    Voice mailbox full, unable to leave a message.

## 2023-02-07 NOTE — Telephone Encounter (Signed)
Noted. FYI 

## 2023-02-07 NOTE — Telephone Encounter (Signed)
Second attempt to reach pt. Voice mailbox full, unable to leave a message.

## 2023-02-08 ENCOUNTER — Other Ambulatory Visit (INDEPENDENT_AMBULATORY_CARE_PROVIDER_SITE_OTHER): Payer: Self-pay | Admitting: Primary Care

## 2023-02-08 MED ORDER — METFORMIN HCL ER 500 MG PO TB24: 500.0000 mg | ORAL_TABLET | Freq: Every day | ORAL | 1 refills | Status: DC

## 2023-02-08 NOTE — Telephone Encounter (Signed)
Will refill metformin 

## 2023-02-18 ENCOUNTER — Telehealth (INDEPENDENT_AMBULATORY_CARE_PROVIDER_SITE_OTHER): Payer: Self-pay | Admitting: Primary Care

## 2023-02-18 NOTE — Telephone Encounter (Signed)
VM full, could not leave message.

## 2023-02-19 ENCOUNTER — Ambulatory Visit (INDEPENDENT_AMBULATORY_CARE_PROVIDER_SITE_OTHER): Payer: Medicaid Other | Admitting: Primary Care

## 2023-02-19 ENCOUNTER — Encounter (INDEPENDENT_AMBULATORY_CARE_PROVIDER_SITE_OTHER): Payer: Self-pay | Admitting: Primary Care

## 2023-02-19 VITALS — BP 109/79 | HR 75 | Resp 16 | Wt 146.2 lb

## 2023-02-19 DIAGNOSIS — I69354 Hemiplegia and hemiparesis following cerebral infarction affecting left non-dominant side: Secondary | ICD-10-CM | POA: Diagnosis not present

## 2023-02-19 DIAGNOSIS — Z09 Encounter for follow-up examination after completed treatment for conditions other than malignant neoplasm: Secondary | ICD-10-CM

## 2023-02-19 DIAGNOSIS — G4700 Insomnia, unspecified: Secondary | ICD-10-CM

## 2023-02-19 DIAGNOSIS — I639 Cerebral infarction, unspecified: Secondary | ICD-10-CM | POA: Diagnosis not present

## 2023-02-19 MED ORDER — LOSARTAN POTASSIUM 50 MG PO TABS
50.0000 mg | ORAL_TABLET | Freq: Every day | ORAL | 0 refills | Status: DC
Start: 2023-02-19 — End: 2023-04-09

## 2023-02-19 NOTE — Progress Notes (Signed)
[facility]   Subjective:   Roberta Calderon is a 52 y.o. female presents for hospital follow up. Admit date to the hospital was 02/04/23, patient was discharged from the hospital on 02/06/23, patient was admitted for: Small vessel cerebrovascular accident. She has decrease ROM bilateral left > right , unable to perform ADL' bathing, grooming and dressing .Also, unable to cook or wash clothes . Unstable gait left her cane at home just left PT.  She is continent of [place]. Patient has No headache, No chest pain, No abdominal pain - No Nausea, No new weakness tingling or numbness, No Cough - shortness of breath. She needs to schedule f/u appt with neurology.  Past Medical History:  Diagnosis Date   Cocaine abuse (HCC) 08/14/2021   Hypertension    Non-insulin dependent type 2 diabetes mellitus (HCC) 02/01/2023   Stroke (cerebrum) (HCC) 04/10/2022     Allergies  Allergen Reactions   Nsaids Nausea And Vomiting and Rash   Asa [Aspirin] Hives      Current Outpatient Medications on File Prior to Visit  Medication Sig Dispense Refill   acetaminophen (TYLENOL) 325 MG tablet Take 2 tablets (650 mg total) by mouth every 6 (six) hours as needed for mild pain (or temp > 37.5 C (99.5 F)).     atorvastatin (LIPITOR) 80 MG tablet Take 1 tablet (80 mg total) by mouth daily. 30 tablet 0   clopidogrel (PLAVIX) 75 MG tablet Take 1 tablet (75 mg total) by mouth daily. 30 tablet 0   losartan (COZAAR) 50 MG tablet Take 1 tablet (50 mg total) by mouth once daily. 30 tablet 0   metFORMIN (GLUCOPHAGE-XR) 500 MG 24 hr tablet Take 1 tablet (500 mg total) by mouth daily with breakfast. 90 tablet 1   nicotine (NICODERM CQ - DOSED IN MG/24 HOURS) 21 mg/24hr patch Place 1 patch (21 mg total) onto the skin daily. 28 patch 0   traZODone (DESYREL) 50 MG tablet Take 0.5-1 tablets (25-50 mg total) by mouth at bedtime. 30 tablet 0   No current facility-administered medications on file prior to visit.      Review of System: Comprehensive ROS Pertinent positive and negative noted in HPI    Objective:  There were no vitals taken for this visit.   Physical Exam: General Appearance: Well nourished, in no apparent distress. Eyes: EOMs, conjunctiva no swelling or erythema Sinuses: No Frontal/maxillary tenderness ENT/Mouth: Ext aud canals clear, TMs without erythema, bulging.  Hearing normal.  Neck: Supple, thyroid normal.  Respiratory: Respiratory effort normal, BS equal bilaterally without rales, rhonchi, wheezing or stridor.  Cardio: RRR with no MRGs. Brisk peripheral pulses without edema.  Abdomen: Soft, + BS.  Non tender, no guarding, rebound, hernias, masses. Lymphatics: Non tender without lymphadenopathy.  Musculoskeletal: Full ROM, 5/5 strength, normal gait.  Skin: Warm, dry without rashes, lesions, ecchymosis.  Neuro: Cranial nerves intact. Normal muscle tone, no cerebellar symptoms. Sensation intact.  Psych: Awake and oriented X 3, normal affect, Insight and Judgment appropriate.    Assessment:  Diagnoses and all orders for this visit:  Hospital discharge follow-up Go to Grayce Sessions, NP (Internal Medicine) Follow up with Erick Colace, MD (Physical Medicine and Rehabilitation); office will call you to arrange your appt (sent) Completed   Hemiparesis affecting left side as late effect of stroke [facility]) 2/2 Acute CVA (cerebrovascular accident) (HCC) Follow up with [facility]; Call the office in 1-2 days to make arrangements for hospital follow-up appointment.  Schedule  during this appt for July 19th @ 8:45 patient agreed and is aware  Insomnia, unspecified type  Stated at end of visit up all night long trazodone 50 does not work Advised Benadryl 25mg  at bed time and d/w neurology   This note has been created with Education officer, environmental. Any transcriptional errors are unintentional.   Grayce Sessions, NP 02/19/2023, 2:21 PM

## 2023-02-19 NOTE — Patient Instructions (Signed)
Insomnia Insomnia is a sleep disorder that makes it difficult to fall asleep or stay asleep. Insomnia can cause fatigue, low energy, difficulty concentrating, mood swings, and poor performance at work or school. There are three different ways to classify insomnia: Difficulty falling asleep. Difficulty staying asleep. Waking up too early in the morning. Any type of insomnia can be long-term (chronic) or short-term (acute). Both are common. Short-term insomnia usually lasts for 3 months or less. Chronic insomnia occurs at least three times a week for longer than 3 months. What are the causes? Insomnia may be caused by another condition, situation, or substance, such as: Having certain mental health conditions, such as anxiety and depression. Using caffeine, alcohol, tobacco, or drugs. Having gastrointestinal conditions, such as gastroesophageal reflux disease (GERD). Having certain medical conditions. These include: Asthma. Alzheimer's disease. Stroke. Chronic pain. An overactive thyroid gland (hyperthyroidism). Other sleep disorders, such as restless legs syndrome and sleep apnea. Menopause. Sometimes, the cause of insomnia may not be known. What increases the risk? Risk factors for insomnia include: Gender. Females are affected more often than males. Age. Insomnia is more common as people get older. Stress and certain medical and mental health conditions. Lack of exercise. Having an irregular work schedule. This may include working night shifts and traveling between different time zones. What are the signs or symptoms? If you have insomnia, the main symptom is having trouble falling asleep or having trouble staying asleep. This may lead to other symptoms, such as: Feeling tired or having low energy. Feeling nervous about going to sleep. Not feeling rested in the morning. Having trouble concentrating. Feeling irritable, anxious, or depressed. How is this diagnosed? This condition  may be diagnosed based on: Your symptoms and medical history. Your health care provider may ask about: Your sleep habits. Any medical conditions you have. Your mental health. A physical exam. How is this treated? Treatment for insomnia depends on the cause. Treatment may focus on treating an underlying condition that is causing the insomnia. Treatment may also include: Medicines to help you sleep. Counseling or therapy. Lifestyle adjustments to help you sleep better. Follow these instructions at home: Eating and drinking  Limit or avoid alcohol, caffeinated beverages, and products that contain nicotine and tobacco, especially close to bedtime. These can disrupt your sleep. Do not eat a large meal or eat spicy foods right before bedtime. This can lead to digestive discomfort that can make it hard for you to sleep. Sleep habits  Keep a sleep diary to help you and your health care provider figure out what could be causing your insomnia. Write down: When you sleep. When you wake up during the night. How well you sleep and how rested you feel the next day. Any side effects of medicines you are taking. What you eat and drink. Make your bedroom a dark, comfortable place where it is easy to fall asleep. Put up shades or blackout curtains to block light from outside. Use a white noise machine to block noise. Keep the temperature cool. Limit screen use before bedtime. This includes: Not watching TV. Not using your smartphone, tablet, or computer. Stick to a routine that includes going to bed and waking up at the same times every day and night. This can help you fall asleep faster. Consider making a quiet activity, such as reading, part of your nighttime routine. Try to avoid taking naps during the day so that you sleep better at night. Get out of bed if you are still awake after   15 minutes of trying to sleep. Keep the lights down, but try reading or doing a quiet activity. When you feel  sleepy, go back to bed. General instructions Take over-the-counter and prescription medicines only as told by your health care provider. Exercise regularly as told by your health care provider. However, avoid exercising in the hours right before bedtime. Use relaxation techniques to manage stress. Ask your health care provider to suggest some techniques that may work well for you. These may include: Breathing exercises. Routines to release muscle tension. Visualizing peaceful scenes. Make sure that you drive carefully. Do not drive if you feel very sleepy. Keep all follow-up visits. This is important. Contact a health care provider if: You are tired throughout the day. You have trouble in your daily routine due to sleepiness. You continue to have sleep problems, or your sleep problems get worse. Get help right away if: You have thoughts about hurting yourself or someone else. Get help right away if you feel like you may hurt yourself or others, or have thoughts about taking your own life. Go to your nearest emergency room or: Call 911. Call the National Suicide Prevention Lifeline at 1-800-273-8255 or 988. This is open 24 hours a day. Text the Crisis Text Line at 741741. Summary Insomnia is a sleep disorder that makes it difficult to fall asleep or stay asleep. Insomnia can be long-term (chronic) or short-term (acute). Treatment for insomnia depends on the cause. Treatment may focus on treating an underlying condition that is causing the insomnia. Keep a sleep diary to help you and your health care provider figure out what could be causing your insomnia. This information is not intended to replace advice given to you by your health care provider. Make sure you discuss any questions you have with your health care provider. Document Revised: 07/24/2021 Document Reviewed: 07/24/2021 Elsevier Patient Education  2024 Elsevier Inc.  

## 2023-02-21 ENCOUNTER — Telehealth: Payer: Self-pay

## 2023-02-21 NOTE — Telephone Encounter (Signed)
PCS referral requesting additional hours faxed to [facility].  She already receives 4 hours x 5 days/week.

## 2023-02-27 ENCOUNTER — Encounter: Payer: Self-pay | Admitting: Internal Medicine

## 2023-02-27 ENCOUNTER — Ambulatory Visit (INDEPENDENT_AMBULATORY_CARE_PROVIDER_SITE_OTHER): Payer: Medicaid Other | Admitting: Primary Care

## 2023-03-12 ENCOUNTER — Encounter: Payer: MEDICAID | Attending: Physical Medicine & Rehabilitation | Admitting: Physical Medicine & Rehabilitation

## 2023-03-13 ENCOUNTER — Other Ambulatory Visit: Payer: Self-pay

## 2023-03-13 ENCOUNTER — Encounter (HOSPITAL_COMMUNITY): Payer: Self-pay

## 2023-03-13 ENCOUNTER — Emergency Department (HOSPITAL_COMMUNITY)
Admission: EM | Admit: 2023-03-13 | Discharge: 2023-03-13 | Discharge status: Against Medical Advice | Payer: MEDICAID | Attending: Emergency Medicine | Admitting: Emergency Medicine

## 2023-03-13 DIAGNOSIS — R110 Nausea: Secondary | ICD-10-CM | POA: Diagnosis not present

## 2023-03-13 DIAGNOSIS — I10 Essential (primary) hypertension: Secondary | ICD-10-CM | POA: Diagnosis not present

## 2023-03-13 DIAGNOSIS — Z5321 Procedure and treatment not carried out due to patient leaving prior to being seen by health care provider: Secondary | ICD-10-CM | POA: Diagnosis not present

## 2023-03-13 DIAGNOSIS — R3589 Other polyuria: Secondary | ICD-10-CM | POA: Diagnosis not present

## 2023-03-13 DIAGNOSIS — R739 Hyperglycemia, unspecified: Secondary | ICD-10-CM | POA: Diagnosis not present

## 2023-03-13 DIAGNOSIS — M79661 Pain in right lower leg: Secondary | ICD-10-CM | POA: Diagnosis not present

## 2023-03-13 DIAGNOSIS — R000 Tachycardia, unspecified: Secondary | ICD-10-CM | POA: Diagnosis not present

## 2023-03-13 LAB — CBC
HCT: 43.7 % (ref 36.0–46.0)
Hemoglobin: 13.5 g/dL (ref 12.0–15.0)
MCH: 23.8 pg — ABNORMAL LOW (ref 26.0–34.0)
MCHC: 30.9 g/dL (ref 30.0–36.0)
MCV: 77.1 fL — ABNORMAL LOW (ref 80.0–100.0)
Platelets: 327 10*3/uL (ref 150–400)
RBC: 5.67 MIL/uL — ABNORMAL HIGH (ref 3.87–5.11)
RDW: 13.7 % (ref 11.5–15.5)
WBC: 6.6 10*3/uL (ref 4.0–10.5)
nRBC: 0 % (ref 0.0–0.2)

## 2023-03-13 LAB — MAGNESIUM: Magnesium: 2.1 mg/dL (ref 1.7–2.4)

## 2023-03-13 LAB — BASIC METABOLIC PANEL
Anion gap: 14 (ref 5–15)
BUN: 21 mg/dL — ABNORMAL HIGH (ref 6–20)
CO2: 17 mmol/L — ABNORMAL LOW (ref 22–32)
Calcium: 9.7 mg/dL (ref 8.9–10.3)
Chloride: 108 mmol/L (ref 98–111)
Creatinine, Ser: 1.44 mg/dL — ABNORMAL HIGH (ref 0.44–1.00)
GFR, Estimated: 44 mL/min — ABNORMAL LOW (ref >60)
Glucose, Bld: 244 mg/dL — ABNORMAL HIGH (ref 70–99)
Potassium: 3.2 mmol/L — ABNORMAL LOW (ref 3.5–5.1)
Sodium: 139 mmol/L (ref 135–145)

## 2023-03-13 LAB — CBG MONITORING, ED: Glucose-Capillary: 238 mg/dL — ABNORMAL HIGH (ref 70–99)

## 2023-03-13 NOTE — ED Notes (Signed)
Pt. Stated that she was tired of waiting and wanted to leave. Tech attempted to notify legal guardian of leaving AMA by calling 3 times with no response. On the 4th call, tech left voicemail to notify legal guardian that pt. Was leaving.

## 2023-03-13 NOTE — ED Triage Notes (Signed)
Pt bib [facility] from home where she lives with her mom. Today she has complaints of RLE cramping that started a couple minutes prior to EMS arrival but has subsided on arrival to ED. Pt denies pain. Pt CBG was 350 with EMS.

## 2023-03-13 NOTE — ED Provider Triage Note (Signed)
Emergency Medicine Provider Triage Evaluation Note  Roberta Calderon , a 52 y.o. female  was evaluated in triage.  Pt complains of muscle cramps.  Review of Systems  Positive: Right calf cramping pain, polyuria Negative: Fevers, chills, shortness of breath, chest pain  Physical Exam  BP (!) 172/114 (BP Location: Right Arm)   Pulse (!) 103   Temp 97.8 F (36.6 C) (Oral)   Resp 16   SpO2 97%  Gen:   Awake, no distress   Resp:  Normal effort  MSK:   Moves extremities without difficulty   Medical Decision Making  Medically screening exam initiated at 3:54 PM.  Appropriate orders placed.  DELCIA Calderon was informed that the remainder of the evaluation will be completed by another provider, this initial triage assessment does not replace that evaluation, and the importance of remaining in the ED until their evaluation is complete.  Patient presents for cramping in her right calf area.  This has improved on arrival.  Blood sugar was elevated with EMS.  Patient does endorse recent polyuria.  She feels that she is dehydrated.   Roberta Manchester, MD 03/13/23 [phone]

## 2023-03-15 ENCOUNTER — Inpatient Hospital Stay: Payer: Federal, State, Local not specified - PPO | Admitting: Neurology

## 2023-03-15 ENCOUNTER — Encounter: Payer: Self-pay | Admitting: Neurology

## 2023-04-01 ENCOUNTER — Telehealth: Payer: Self-pay | Admitting: Primary Care

## 2023-04-01 NOTE — Telephone Encounter (Signed)
Will forward to provider  

## 2023-04-01 NOTE — Telephone Encounter (Signed)
Copied from CRM [id number]. Topic: General - Inquiry >> Apr 01, 2023  3:19 PM Runell Gess P wrote: Reason for CRM: pt would like Marcelino Duster to call her.  CB@ [phone]

## 2023-04-05 ENCOUNTER — Telehealth (INDEPENDENT_AMBULATORY_CARE_PROVIDER_SITE_OTHER): Payer: Self-pay | Admitting: Primary Care

## 2023-04-05 NOTE — Telephone Encounter (Signed)
Will forward to provider and case manager

## 2023-04-05 NOTE — Telephone Encounter (Signed)
Pt is calling to request a letter to go to [employer] - stating that she can be her own payee- Pt states that she will pick up the paper on Monday. Please advise Pt reports that he phone is off and there is no other number that the patient can be contacted on.

## 2023-04-08 ENCOUNTER — Telehealth (INDEPENDENT_AMBULATORY_CARE_PROVIDER_SITE_OTHER): Payer: Self-pay | Admitting: Primary Care

## 2023-04-08 NOTE — Telephone Encounter (Signed)
Patient called and request that provider fax her papers to [employer] today.

## 2023-04-09 ENCOUNTER — Other Ambulatory Visit (INDEPENDENT_AMBULATORY_CARE_PROVIDER_SITE_OTHER): Payer: Self-pay | Admitting: Primary Care

## 2023-04-09 DIAGNOSIS — I639 Cerebral infarction, unspecified: Secondary | ICD-10-CM

## 2023-04-09 NOTE — Telephone Encounter (Signed)
Not able to reach pt due to non working phone. Pt states yesterday when she came by the office she doesn't have a phone. When pt calls back please inform pt that she will need to contact Neurology to get this letter per pcp and case manager

## 2023-04-09 NOTE — Telephone Encounter (Signed)
Medication Refill - Medication: atorvastatin (LIPITOR) 80 MG tablet losartan (COZAAR) 50 MG tablet  Pt states that she is out of both medications.   Has the patient contacted their pharmacy? No.  Preferred Pharmacy (with phone number or street name): [facility] [address] [place]), [place] - [id number]  Phone: [phone] Fax: [phone]  Has the patient been seen for an appointment in the last year OR does the patient have an upcoming appointment? Yes.    Agent: Please be advised that RX refills may take up to 3 business days. We ask that you follow-up with your pharmacy.

## 2023-04-09 NOTE — Telephone Encounter (Signed)
Pt is calling back to see if PCP can fax over her paper work to [employer] today. Please call pt back once papers have been faxed over.

## 2023-04-10 MED ORDER — LOSARTAN POTASSIUM 50 MG PO TABS
50.0000 mg | ORAL_TABLET | Freq: Every day | ORAL | 0 refills | Status: DC
Start: 2023-04-10 — End: 2023-06-25

## 2023-04-10 NOTE — Telephone Encounter (Signed)
Requested Prescriptions  Pending Prescriptions Disp Refills   atorvastatin (LIPITOR) 80 MG tablet 30 tablet 0    Sig: Take 1 tablet (80 mg total) by mouth daily.     Cardiovascular:  Antilipid - Statins Failed - 04/09/2023  8:58 AM      Failed - Lipid Panel in normal range within the last 12 months    Cholesterol, Total  Date Value Ref Range Status  07/30/2022 208 (H) 100 - 199 mg/dL Final   Cholesterol  Date Value Ref Range Status  02/01/2023 220 (H) 0 - 200 mg/dL Final   LDL Chol Calc (NIH)  Date Value Ref Range Status  07/30/2022 133 (H) 0 - 99 mg/dL Final   LDL Cholesterol  Date Value Ref Range Status  02/01/2023 153 (H) 0 - 99 mg/dL Final    Comment:           Total Cholesterol/HDL:CHD Risk Coronary Heart Disease Risk Table                     Men   Women  1/2 Average Risk   3.4   3.3  Average Risk       5.0   4.4  2 X Average Risk   9.6   7.1  3 X Average Risk  23.4   11.0        Use the calculated Patient Ratio above and the CHD Risk Table to determine the patient's CHD Risk.        ATP III CLASSIFICATION (LDL):  <100     mg/dL   Optimal  829-562  mg/dL   Near or Above                    Optimal  130-159  mg/dL   Borderline  130-865  mg/dL   High  >784     mg/dL   Very High Performed at [facility], [address], [place], [place] [address]    HDL  Date Value Ref Range Status  02/01/2023 49 >40 mg/dL Final  52/84/1324 57 >40 mg/dL Final   Triglycerides  Date Value Ref Range Status  02/01/2023 90 <150 mg/dL Final         Passed - Patient is not pregnant      Passed - Valid encounter within last 12 months    Recent Outpatient Visits           1 month ago Hospital discharge follow-up   [place] [facility] Grayce Sessions, NP   2 months ago Depression with anxiety   [place] [facility] Grayce Sessions, NP   8 months ago Cervical cancer screening   [place] [facility]  Grayce Sessions, NP   8 months ago Type 2 diabetes mellitus without complication, without long-term current use of insulin (HCC)   [place] [facility] Grayce Sessions, NP   11 months ago Type 2 diabetes mellitus without complication, without long-term current use of insulin (HCC)   [place] [facility] Grayce Sessions, NP       Future Appointments             In 1 month Edwards, Kinnie Scales, NP [place] [facility]             losartan (COZAAR) 50 MG tablet 90 tablet 0    Sig: Take 1 tablet (50 mg total) by mouth  once daily.     Cardiovascular:  Angiotensin Receptor Blockers Failed - 04/09/2023  8:58 AM      Failed - Cr in normal range and within 180 days    Creatinine, Ser  Date Value Ref Range Status  03/13/2023 1.44 (H) 0.44 - 1.00 mg/dL Final         Failed - K in normal range and within 180 days    Potassium  Date Value Ref Range Status  03/13/2023 3.2 (L) 3.5 - 5.1 mmol/L Final         Failed - Last BP in normal range    BP Readings from Last 1 Encounters:  03/13/23 (!) 172/114         Passed - Patient is not pregnant      Passed - Valid encounter within last 6 months    Recent Outpatient Visits           1 month ago Hospital discharge follow-up   [place] [facility] Grayce Sessions, NP   2 months ago Depression with anxiety   [place] [facility] Grayce Sessions, NP   8 months ago Cervical cancer screening   [place] [facility] Grayce Sessions, NP   8 months ago Type 2 diabetes mellitus without complication, without long-term current use of insulin (HCC)   [place] [facility] Grayce Sessions, NP   11 months ago Type 2 diabetes mellitus without complication, without long-term current use of insulin [facility])    [facility] Grayce Sessions, NP       Future  Appointments             In 1 month Randa Evens, Kinnie Scales, NP  [facility]

## 2023-04-10 NOTE — Telephone Encounter (Signed)
Requested medication (s) are due for refill today: yes  Requested medication (s) are on the active medication list: yes  Last refill:  02/06/23  Future visit scheduled: yes  Notes to clinic:  Unable to refill per protocol, last refill by another provider. Routing for approval from PCP.     Requested Prescriptions  Pending Prescriptions Disp Refills   atorvastatin (LIPITOR) 80 MG tablet 30 tablet 0    Sig: Take 1 tablet (80 mg total) by mouth daily.     Cardiovascular:  Antilipid - Statins Failed - 04/09/2023  8:58 AM      Failed - Lipid Panel in normal range within the last 12 months    Cholesterol, Total  Date Value Ref Range Status  07/30/2022 208 (H) 100 - 199 mg/dL Final   Cholesterol  Date Value Ref Range Status  02/01/2023 220 (H) 0 - 200 mg/dL Final   LDL Chol Calc (NIH)  Date Value Ref Range Status  07/30/2022 133 (H) 0 - 99 mg/dL Final   LDL Cholesterol  Date Value Ref Range Status  02/01/2023 153 (H) 0 - 99 mg/dL Final    Comment:           Total Cholesterol/HDL:CHD Risk Coronary Heart Disease Risk Table                     Men   Women  1/2 Average Risk   3.4   3.3  Average Risk       5.0   4.4  2 X Average Risk   9.6   7.1  3 X Average Risk  23.4   11.0        Use the calculated Patient Ratio above and the CHD Risk Table to determine the patient's CHD Risk.        ATP III CLASSIFICATION (LDL):  <100     mg/dL   Optimal  161-096  mg/dL   Near or Above                    Optimal  130-159  mg/dL   Borderline  045-409  mg/dL   High  >811     mg/dL   Very High Performed at [facility], [address], [place], [place] [address]    HDL  Date Value Ref Range Status  02/01/2023 49 >40 mg/dL Final  29/56/2130 57 >86 mg/dL Final   Triglycerides  Date Value Ref Range Status  02/01/2023 90 <150 mg/dL Final         Passed - Patient is not pregnant      Passed - Valid encounter within last 12 months    Recent Outpatient Visits           1  month ago Hospital discharge follow-up   [place] [facility] Grayce Sessions, NP   2 months ago Depression with anxiety   [place] [facility] Grayce Sessions, NP   8 months ago Cervical cancer screening   [place] [facility] Grayce Sessions, NP   8 months ago Type 2 diabetes mellitus without complication, without long-term current use of insulin (HCC)   [place] [facility] Grayce Sessions, NP   11 months ago Type 2 diabetes mellitus without complication, without long-term current use of insulin (HCC)   [place] [facility] Grayce Sessions, NP       Future Appointments  In 1 month Edwards, Kinnie Scales, NP [place] [facility]            Signed Prescriptions Disp Refills   losartan (COZAAR) 50 MG tablet 90 tablet 0    Sig: Take 1 tablet (50 mg total) by mouth once daily.     Cardiovascular:  Angiotensin Receptor Blockers Failed - 04/09/2023  8:58 AM      Failed - Cr in normal range and within 180 days    Creatinine, Ser  Date Value Ref Range Status  03/13/2023 1.44 (H) 0.44 - 1.00 mg/dL Final         Failed - K in normal range and within 180 days    Potassium  Date Value Ref Range Status  03/13/2023 3.2 (L) 3.5 - 5.1 mmol/L Final         Failed - Last BP in normal range    BP Readings from Last 1 Encounters:  03/13/23 (!) 172/114         Passed - Patient is not pregnant      Passed - Valid encounter within last 6 months    Recent Outpatient Visits           1 month ago Hospital discharge follow-up   [place] [facility] Grayce Sessions, NP   2 months ago Depression with anxiety   [place] [facility] Grayce Sessions, NP   8 months ago Cervical cancer screening   [place] [facility] Grayce Sessions, NP   8 months ago Type 2 diabetes mellitus without  complication, without long-term current use of insulin (HCC)   [place] [facility] Grayce Sessions, NP   11 months ago Type 2 diabetes mellitus without complication, without long-term current use of insulin [facility])   [place] [facility] Grayce Sessions, NP       Future Appointments             In 1 month Randa Evens, Kinnie Scales, NP [place] [facility]

## 2023-04-11 LAB — AMB RESULTS CONSOLE CBG: Glucose: 124

## 2023-04-11 NOTE — Progress Notes (Signed)
No new SDOH/needs since 06/07

## 2023-04-12 NOTE — Telephone Encounter (Signed)
Will forward to provider  

## 2023-04-16 ENCOUNTER — Encounter: Payer: Self-pay | Admitting: *Deleted

## 2023-04-16 NOTE — Progress Notes (Unsigned)
Pt attended 04/11/23 screening event where her b/p was 127/79 and her blood sugar was 124. At the event, pt confirmed her PCP was Roberta Passe NP at [facility] clinic and did not identify any SDOH insecurities. Chart review indicates pt was seen last by PCP on 02/19/23, where her b/p was 109/79, and pt has future appt with NP Edwards on 05/22/23. During f/u call to pt, she confirmed she is still going to see Roberta Passe, NP for her healthcare and noted she was still able to contact the [facility] as needed. Pt was asked specifically during call if she needed any assistance with obtaining meds, appts, transportation, food and pt stated she did not need any support of these SDOH or for accessing needed healthcare in general. No additional health equity team support indicated at this time.

## 2023-04-17 ENCOUNTER — Telehealth (INDEPENDENT_AMBULATORY_CARE_PROVIDER_SITE_OTHER): Payer: Self-pay

## 2023-04-17 ENCOUNTER — Encounter: Payer: Federal, State, Local not specified - PPO | Admitting: Internal Medicine

## 2023-04-17 NOTE — Telephone Encounter (Signed)
Returned pt call and reinterated message from provider in regards to contacting neurology in regards to the letter she is requesting. Provided pt phone number to [facility]. Pt doesn't have any other questions or concerns

## 2023-04-17 NOTE — Telephone Encounter (Signed)
Patient informed of message below and she stated "no" she would like to speak with PCP directly. Confirmed patient call back #[phone]

## 2023-04-17 NOTE — Telephone Encounter (Signed)
Copied from CRM [id number]. Topic: Referral - Request for Referral >> Apr 17, 2023  4:10 PM Franchot Heidelberg wrote: Reason for CRM: Pt called to report that she needs a referral to see a neurologist, wants to be seen locally

## 2023-04-20 ENCOUNTER — Other Ambulatory Visit (INDEPENDENT_AMBULATORY_CARE_PROVIDER_SITE_OTHER): Payer: Self-pay | Admitting: Primary Care

## 2023-04-20 DIAGNOSIS — I6381 Other cerebral infarction due to occlusion or stenosis of small artery: Secondary | ICD-10-CM

## 2023-04-20 DIAGNOSIS — I639 Cerebral infarction, unspecified: Secondary | ICD-10-CM

## 2023-04-20 DIAGNOSIS — I69354 Hemiplegia and hemiparesis following cerebral infarction affecting left non-dominant side: Secondary | ICD-10-CM

## 2023-05-01 ENCOUNTER — Telehealth: Payer: Self-pay | Admitting: Neurology

## 2023-05-01 ENCOUNTER — Encounter: Payer: Self-pay | Admitting: Neurology

## 2023-05-01 NOTE — Telephone Encounter (Signed)
LVM and sent letter in mail informing pt of need to reschedule 07/02/23 appt - office closing for election day

## 2023-05-21 ENCOUNTER — Telehealth (INDEPENDENT_AMBULATORY_CARE_PROVIDER_SITE_OTHER): Payer: Self-pay | Admitting: Primary Care

## 2023-05-22 ENCOUNTER — Ambulatory Visit (INDEPENDENT_AMBULATORY_CARE_PROVIDER_SITE_OTHER): Payer: Federal, State, Local not specified - PPO | Admitting: Primary Care

## 2023-06-24 ENCOUNTER — Telehealth (INDEPENDENT_AMBULATORY_CARE_PROVIDER_SITE_OTHER): Payer: Self-pay | Admitting: Primary Care

## 2023-06-24 NOTE — Telephone Encounter (Signed)
Spoke to pt. Will be at apt.  

## 2023-06-25 ENCOUNTER — Telehealth (INDEPENDENT_AMBULATORY_CARE_PROVIDER_SITE_OTHER): Payer: 59 | Admitting: Primary Care

## 2023-06-25 DIAGNOSIS — G4700 Insomnia, unspecified: Secondary | ICD-10-CM | POA: Diagnosis not present

## 2023-06-25 DIAGNOSIS — F418 Other specified anxiety disorders: Secondary | ICD-10-CM | POA: Diagnosis not present

## 2023-06-25 DIAGNOSIS — I639 Cerebral infarction, unspecified: Secondary | ICD-10-CM

## 2023-06-25 MED ORDER — CLOPIDOGREL BISULFATE 75 MG PO TABS: 75.0000 mg | ORAL_TABLET | Freq: Every day | ORAL | 0 refills | Status: DC

## 2023-06-25 MED ORDER — TRAZODONE HCL 50 MG PO TABS
25.0000 mg | ORAL_TABLET | Freq: Every day | ORAL | 0 refills | Status: DC
Start: 2023-06-25 — End: 2023-10-29

## 2023-06-25 MED ORDER — METFORMIN HCL ER 500 MG PO TB24: 500.0000 mg | ORAL_TABLET | Freq: Every day | ORAL | 1 refills | Status: DC

## 2023-06-25 MED ORDER — ATORVASTATIN CALCIUM 80 MG PO TABS: 80.0000 mg | ORAL_TABLET | Freq: Every day | ORAL | 0 refills | Status: DC

## 2023-06-25 MED ORDER — LOSARTAN POTASSIUM 50 MG PO TABS
50.0000 mg | ORAL_TABLET | Freq: Every day | ORAL | 0 refills | Status: DC
Start: 2023-06-25 — End: 2023-10-18

## 2023-06-25 NOTE — Progress Notes (Signed)
[facility]  Virtual Visit via Telephone Note  I connected with Roberta Calderon, on 06/25/2023 at 3:12 PM by telephone and verified that I am speaking with the correct person using two identifiers.   Consent: I discussed the limitations, risks, security and privacy concerns of performing an evaluation and management service by telephone and the availability of in person appointments. I also discussed with the patient that there may be a patient responsible charge related to this service. The patient expressed understanding and agreed to proceed.   Location of Patient: Home  Location of Provider: [facility]   Persons participating in Telemedicine visit: Jonica M Priebe Eon Zunker,  NP   History of Present Illness: .Roberta Calderon is a 52 year old female was having a televisit to inform PCP that she had another stroke and was in the hospital.  Review of care everywhere unable to find documentation of hospitalization note on June 08, 2022 she was at [facility] for an eye exam. 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     atorvastatin (LIPITOR) 80 MG tablet Take 1 tablet (80 mg total) by mouth daily. 30 tablet 0   clopidogrel (PLAVIX) 75 MG tablet Take 1  tablet (75 mg total) by mouth daily. 30 tablet 0   losartan (COZAAR) 50 MG tablet Take 1 tablet (50 mg total) by mouth once daily. 90 tablet 0   metFORMIN (GLUCOPHAGE-XR) 500 MG 24 hr tablet Take 1 tablet (500 mg total) by mouth daily with breakfast. 90 tablet 1   nicotine (NICODERM CQ - DOSED IN MG/24 HOURS) 21 mg/24hr patch Place 1 patch (21 mg total) onto the skin daily. 28 patch 0   traZODone (DESYREL) 50 MG tablet Take 0.5-1 tablets (25-50 mg total) by mouth at bedtime. 30 tablet 0   No current facility-administered medications on file prior to visit.    Observations/Objective: See HPI  Assessment and Plan: Diagnoses and all orders for this visit:  Acute CVA (cerebrovascular accident) (HCC) -     losartan (COZAAR) 50 MG tablet; Take 1 tablet (50 mg total) by mouth once daily.  Depression with anxiety -     traZODone (DESYREL) 50 MG tablet; Take 0.5-1 tablets (25-50 mg total) by mouth at bedtime.  Insomnia, unspecified type -     traZODone (DESYREL) 50 MG tablet; Take 0.5-1 tablets (25-50 mg total) by mouth at bedtime.  Other orders -     atorvastatin (LIPITOR) 80 MG tablet; Take 1 tablet (80 mg total) by mouth daily. -     clopidogrel (PLAVIX) 75 MG tablet; Take 1 tablet (75 mg total) by mouth daily. -     metFORMIN (GLUCOPHAGE-XR) 500 MG 24 hr tablet; Take 1 tablet (500 mg total) by  mouth daily with breakfast.     Follow Up Instructions: 3 months   I discussed the assessment and treatment plan with the patient. The patient was provided an opportunity to ask questions and all were answered. The patient agreed with the plan and demonstrated an understanding of the instructions.   The patient was advised to call back or seek an in-person evaluation if the symptoms worsen or if the condition fails to improve as anticipated.     I provided 25 minutes total of non-face-to-face time during this encounter including median intraservice time, reviewing previous notes,  investigations, ordering medications, medical decision making, coordinating care and patient verbalized understanding at the end of the visit.    This note has been created with Education officer, environmental. Any transcriptional errors are unintentional.   Grayce Sessions, NP 06/25/2023, 3:12 PM

## 2023-06-28 NOTE — Telephone Encounter (Signed)
Spoke to pt. She will be present at apt. 9/25

## 2023-07-02 ENCOUNTER — Inpatient Hospital Stay: Payer: Federal, State, Local not specified - PPO | Admitting: Neurology

## 2023-07-03 ENCOUNTER — Encounter: Payer: Self-pay | Admitting: Neurology

## 2023-07-03 ENCOUNTER — Inpatient Hospital Stay: Payer: Federal, State, Local not specified - PPO | Admitting: Neurology

## 2023-07-10 ENCOUNTER — Encounter (INDEPENDENT_AMBULATORY_CARE_PROVIDER_SITE_OTHER): Payer: Self-pay | Admitting: Primary Care

## 2023-07-10 ENCOUNTER — Ambulatory Visit (INDEPENDENT_AMBULATORY_CARE_PROVIDER_SITE_OTHER): Payer: 59 | Admitting: Primary Care

## 2023-07-10 ENCOUNTER — Telehealth: Payer: Self-pay | Admitting: Primary Care

## 2023-07-10 VITALS — BP 154/93 | HR 78 | Resp 16 | Wt 138.2 lb

## 2023-07-10 DIAGNOSIS — R1032 Left lower quadrant pain: Secondary | ICD-10-CM

## 2023-07-10 DIAGNOSIS — I69354 Hemiplegia and hemiparesis following cerebral infarction affecting left non-dominant side: Secondary | ICD-10-CM

## 2023-07-10 DIAGNOSIS — Z1211 Encounter for screening for malignant neoplasm of colon: Secondary | ICD-10-CM

## 2023-07-10 NOTE — Progress Notes (Signed)
[facility]  Roberta Calderon, is a 52 y.o. female  QVZ:[id number]  [id number]:[id number]  DOB - [birth year]  Pain      Subjective:   Roberta Calderon is a 52 y.o. female here today for an acute visit . She is c/o hurting right side pain and difficulty using her left had (HX of CVA) .Requesting to PT.  Marland Kitchen Patient has No headache, No chest pain, No abdominal pain - No Nausea, No new weakness tingling or numbness, No Cough - shortness of breath  No problems updated.  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     atorvastatin (LIPITOR) 80 MG tablet Take 1 tablet (80 mg total) by mouth daily. 90 tablet 0   clopidogrel (PLAVIX) 75 MG tablet Take 1 tablet (75 mg total) by mouth daily. 30 tablet 0   losartan (COZAAR) 50 MG tablet Take 1 tablet (50 mg total) by mouth once daily. 90 tablet 0   metFORMIN (GLUCOPHAGE-XR) 500 MG 24 hr tablet Take 1 tablet (500 mg total) by mouth daily with breakfast. 90 tablet 1   nicotine (NICODERM CQ - DOSED IN MG/24 HOURS) 21 mg/24hr patch Place 1 patch (21 mg total) onto the skin daily. 28 patch 0   traZODone (DESYREL) 50 MG tablet Take 0.5-1 tablets (25-50 mg total) by mouth at bedtime. 30 tablet 0   No current facility-administered medications on file prior to visit.    Objective:  There were no vitals filed for this visit.  Comprehensive ROS Pertinent positive and negative noted in HPI   Exam General appearance : Awake, alert, not in any distress. Speech  Clear. Not toxic looking HEENT: Atraumatic and Normocephalic, pupils equally reactive to light and accomodation Neck: Supple, no JVD. No cervical lymphadenopathy.  Chest: Good air entry bilaterally, no added sounds  CVS: S1 S2 regular, no murmurs.  Abdomen: Bowel sounds present,  tender and not distended with  guarding no  rigidity or rebound. Extremities: B/L Lower Ext shows no edema, both legs are warm to touch Neurology: Awake alert, left side weakness  Skin: No Rash  Data Review Lab Results  Component Value Date   HGBA1C 6.5 (H) 02/01/2023   HGBA1C 6.5 07/30/2022   HGBA1C 6.3 (H) 04/11/2022    Assessment & Plan  Diagnoses and all orders for this visit:  Hemiparesis affecting left side as late effect of stroke (HCC) Refer to PT  Needs to reschedule neurology appt . Stated received letter that they were closed on her appt date.   Left lower quadrant abdominal pain causes Gas, constipation, diarrhea, indigestion, No pain with Pain in the upper left side or middle  with  eating or drinking, Constipation - managed with laxatives   Patient have been counseled extensively about nutrition and exercise. Other issues discussed during this visit include: low cholesterol diet, weight control and daily exercise, foot care, annual eye examinations at Ophthalmology, importance of adherence with medications and regular follow-up. We also discussed long  term complications of uncontrolled diabetes and hypertension.   Return in about 6 months (around 01/07/2024) for medical conditions.  The patient was given clear instructions to go to ER or return to medical center if symptoms don't improve, worsen or new problems develop. The patient verbalized understanding. The patient was told to call to get lab results if they haven't heard anything in the next week.   This note has been created with Education officer, environmental. Any transcriptional errors are unintentional.    Grayce Sessions, NP 07/10/2023, 2:43 PM

## 2023-07-10 NOTE — Telephone Encounter (Signed)
Copied from CRM [id number]. Topic: General - Other >> Jul 10, 2023  9:30 AM Dondra Prader A wrote: Reason for CRM: FYI- Pt is adamant about keeping her appt today at 2:50 pm. Pt states that she does not need no paper work and need to be seen. I did call over to the office and spoke with pt PCP Gwinda Passe) and she explained that she has already seen the pt for her hospital follow up and since pt is having seizures and black outs pt is needing to see neurology. Pt PCP does not manage strokes. I did explain to the pt the reason why she would need to follow up with neurology and pt states that she is coming anyway and will be at the office at 2:50 pm.

## 2023-07-10 NOTE — Telephone Encounter (Signed)
FYI

## 2023-07-17 ENCOUNTER — Other Ambulatory Visit: Payer: Self-pay

## 2023-07-17 ENCOUNTER — Ambulatory Visit: Payer: 59 | Attending: Primary Care | Admitting: Physical Therapy

## 2023-07-17 DIAGNOSIS — M6281 Muscle weakness (generalized): Secondary | ICD-10-CM | POA: Diagnosis not present

## 2023-07-17 DIAGNOSIS — R2689 Other abnormalities of gait and mobility: Secondary | ICD-10-CM | POA: Insufficient documentation

## 2023-07-17 DIAGNOSIS — I69354 Hemiplegia and hemiparesis following cerebral infarction affecting left non-dominant side: Secondary | ICD-10-CM | POA: Insufficient documentation

## 2023-07-17 DIAGNOSIS — M25642 Stiffness of left hand, not elsewhere classified: Secondary | ICD-10-CM | POA: Diagnosis not present

## 2023-07-17 NOTE — Therapy (Unsigned)
OUTPATIENT PHYSICAL THERAPY NEURO EVALUATION   Patient Name: Roberta Calderon MRN: [id number] DOB:Apr 12, 1971, 52 y.o., female Today's Date: 07/17/2023   PCP: Grayce Sessions, NP REFERRING PROVIDER: Grayce Sessions, NP  END OF SESSION:  PT End of Session - 07/17/23 1218     Visit Number 1    Authorization Type [employer]    Authorization Time Period Auth request emailed to Jonny Ruiz 07/17/23    Authorization - Number of Visits 27    PT Start Time 1230    PT Stop Time 1315    PT Time Calculation (min) 45 min    Activity Tolerance Patient tolerated treatment well             Past Medical History:  Diagnosis Date   Cocaine abuse (HCC) 08/14/2021   Hypertension    Non-insulin dependent type 2 diabetes mellitus (HCC) 02/01/2023   Stroke (cerebrum) (HCC) 04/10/2022   No past surgical history on file. Patient Active Problem List   Diagnosis Date Noted   Small vessel cerebrovascular accident (CVA) (HCC) 02/04/2023   Non-insulin dependent type 2 diabetes mellitus (HCC) 02/01/2023   Stroke (cerebrum) (HCC) 04/10/2022   Acute CVA (cerebrovascular accident) (HCC) 02/05/2022   Cerebrovascular accident (CVA) of right basal ganglia (HCC) 08/14/2021   Hypertensive urgency 08/14/2021   Cocaine abuse (HCC) 08/14/2021   Metabolic syndrome 08/14/2021   Right sided weakness 08/14/2021   Alcohol abuse 08/14/2021   Prolonged QT interval 08/14/2021   ICH (intracerebral hemorrhage) (HCC) 02/07/2021   Dyslipidemia    Right thalamic infarction (HCC) 04/08/2020   Prediabetes    Polysubstance abuse (HCC)    Hemiparesis affecting left side as late effect of stroke (HCC)    Ischemic stroke (HCC) 04/06/2020   Hypertension 04/06/2020   Tobacco use disorder 04/06/2020    ONSET DATE: 01/31/23 new CVA  REFERRING DIAG: N82.956 (ICD-10-CM) - Hemiparesis affecting left side as late effect of stroke (HCC)  THERAPY DIAG:  Muscle weakness (generalized)  Other abnormalities of gait  and mobility  Stiffness of left hand, not elsewhere classified  Rationale for Evaluation and Treatment: Rehabilitation  SUBJECTIVE:                                                                                                                                                                                             SUBJECTIVE STATEMENT: Pt reports she had another CVA about a month ago but did not go to the hospital. Pt wants to work on her L arm and L side. States her L side weakness has been worse since the last CVA. Pt reports difficulty with getting in/out  of bed. Gets spasms in her low back.  Pt accompanied by: self  PERTINENT HISTORY: presented to the [facility] ED on 01/31/2023 complaining of new onset of left sided weakness and slurred speech. Symptoms started on 6/05. She is maintained on Plavix due to prior stroke. PMH significant for hypertension, tobacco use and DM-2.  PAIN:  Are you having pain? No  PRECAUTIONS: Fall  RED FLAGS: None   WEIGHT BEARING RESTRICTIONS: No  FALLS: Has patient fallen in last 6 months? Yes. Number of falls 1 -- when she had a CVA  LIVING ENVIRONMENT: Lives with:  mom Lives in: House/apartment Stairs: No Has following equipment at home: None  PLOF: Independent  PATIENT GOALS: Improving L side mobility and use  OBJECTIVE:  Note: Objective measures were completed at Evaluation unless otherwise noted.  DIAGNOSTIC FINDINGS: 02/01/23 Brain MRI IMPRESSION: 1. 9 mm acute ischemic nonhemorrhagic infarct involving the posterior right frontal centrum semi ovale. 2. Underlying moderate to advanced chronic microvascular ischemic disease with multiple remote lacunar infarcts involving the bilateral basal ganglia, thalami, pons, and cerebellum. 3. Multiple scattered chronic micro hemorrhages as above, she most characteristic of chronic poorly controlled hypertension.    COGNITION: Overall cognitive status: Within functional limits for tasks  assessed   SENSATION: WFL  COORDINATION:  Unable to do heel to shin on L due to weakness  EDEMA:  none  MUSCLE TONE: Did not assess  POSTURE: increased lumbar lordosis  UPPER EXTREMITY MMT: TBA  MMT Right eval Left eval  Shoulder flexion    Shoulder extension    Shoulder abduction    Shoulder adduction    Shoulder extension    Shoulder internal rotation    Shoulder external rotation    Middle trapezius    Lower trapezius    Elbow flexion    Elbow extension    Wrist flexion    Wrist extension    Wrist ulnar deviation    Wrist radial deviation    Wrist pronation    Wrist supination    Grip strength     (Blank rows = not tested)   LOWER EXTREMITY MMT:    MMT Right Eval Left Eval  Hip flexion 4 2  Hip extension 3- 1  Hip abduction 3- 2-  Hip adduction    Hip internal rotation    Hip external rotation    Knee flexion 5 4  Knee extension 5 4-  Ankle dorsiflexion    Ankle plantarflexion    Ankle inversion    Ankle eversion    (Blank rows = not tested)  BED MOBILITY:  Slow and guarded with rolling L<>R and sup<>sit. Increased time required for trunk negotiation with sup to sit Difficulty rolling R side to prone but able to roll L side to prone  GAIT: Gait pattern:  Step through pattern, decreased L LE stance time with mild ER, hip drop with L stance phase Distance walked: x100' Assistive device utilized: None Level of assistance: Complete Independence  FUNCTIONAL TESTS:  5 times sit to stand: 23.13 sec Berg Balance Scale: 35/56 TUG 22.5 sec  [facility] PT Assessment - 07/17/23 0001       Standardized Balance Assessment   Standardized Balance Assessment Berg Balance Test      Berg Balance Test   Sit to Stand Able to stand  independently using hands    Standing Unsupported Able to stand 2 minutes with supervision    Sitting with Back Unsupported but Feet Supported on Floor or Stool Able to  sit safely and securely 2 minutes    Stand to Sit Controls  descent by using hands    Transfers Able to transfer safely, definite need of hands    Standing Unsupported with Eyes Closed Able to stand 10 seconds with supervision    Standing Unsupported with Feet Together Able to place feet together independently and stand for 1 minute with supervision    From Standing, Reach Forward with Outstretched Arm Can reach forward >5 cm safely (2")    From Standing Position, Pick up Object from Floor Able to pick up shoe, needs supervision    From Standing Position, Turn to Look Behind Over each Shoulder Looks behind one side only/other side shows less weight shift    Turn 360 Degrees Able to turn 360 degrees safely but slowly   7.85 sec   Standing Unsupported, Alternately Place Feet on Step/Stool Needs assistance to keep from falling or unable to try    Standing Unsupported, One Foot in Front Able to plae foot ahead of the other independently and hold 30 seconds    Standing on One Leg Unable to try or needs assist to prevent fall    Total Score 35              PATIENT SURVEYS:  The Patient-Specific Functional Scale  Initial:  I am going to ask you to identify up to 3 important activities that you are unable to do or are having difficulty with as a result of this problem.  Today are there any activities that you are unable to do or having difficulty with because of this?  (Patient shown scale and patient rated each activity)  Follow up: When you first came in you had difficulty performing these activities.  Today do you still have difficulty?  Patient-Specific activity scoring scheme (Point to one number):  0 1 2 3 4 5 6 7 8 9  10 Unable                                                                                                          Able to perform To perform                                                                                                    activity at the same Activity         Level as before  Injury or problem Activity Initial (eval): Follow up:  Getting in /out of bed 5   2.   Getting in/out of shower    5   3.   Doing dishes 5   Total 15/3 = 5        TODAY'S TREATMENT:                                                                                                                              DATE: 07/17/23 See HEP below    PATIENT EDUCATION: Education details: Exam findings, POC, initial HEP Person educated: Patient Education method: Explanation, Demonstration, and Handouts Education comprehension: verbalized understanding, returned demonstration, and needs further education  HOME EXERCISE PROGRAM: Access Code: [email or web address] URL: [email or web address] Date: 07/17/2023 Prepared by: Vernon Prey April Kirstie Peri  Exercises - Supine Bridge  - 1 x daily - 7 x weekly - 2 sets - 10 reps - Supine Active Straight Leg Raise  - 1 x daily - 7 x weekly - 2 sets - 10 reps - Quadruped Alternating Leg Extensions  - 1 x daily - 7 x weekly - 2 sets - 10 reps - Quadruped Fire Hydrant  - 1 x daily - 7 x weekly - 2 sets - 10 reps  GOALS: Goals reviewed with patient? Yes  SHORT TERM GOALS: Target date: 08/14/2023   Pt will be ind with initial HEP Baseline: Goal status: INITIAL  2.  Pt will have improved 5x STS to </=20 sec Baseline:  Goal status: INITIAL  3.  Pt will have improved TUG to </=18 sec Baseline:  Goal status: INITIAL    LONG TERM GOALS: Target date: 09/11/2023   Pt will be ind with management and progression of HEP Baseline:  Goal status: INITIAL  2.  Pt will have improved PSFS average to >/=7 to demo MCID Baseline:  Goal status: INITIAL  3.  Pt will have improved Berg Balance Score to >/=43/56 to demo MCID Baseline:  Goal status: INITIAL  4.  Pt will have improved TUG to </=15 sec to demo decreased fall risk Baseline:  Goal status: INITIAL  5.  Pt will  have improved 5x STS to </=13 sec to demo low fall risk Baseline:  Goal status: INITIAL    ASSESSMENT:  CLINICAL IMPRESSION: Patient is a 52 y.o. F who was seen today for physical therapy evaluation and treatment for CVA with L side weakness. Pt notes she had a new CVA last month but didn't go to the hospital. She states she saw her PCP instead and has noted decreased L side strength. Assessment significant for L LE and UE weakness, decreased balance, and decreased endurance affecting functional mobility, t/fs, and amb. Pt will benefit from PT to address these deficits for improved function at home and decreased fall risk.   OBJECTIVE IMPAIRMENTS: Abnormal gait, decreased activity tolerance, decreased balance, decreased coordination, decreased endurance, decreased mobility, difficulty walking, decreased ROM, decreased strength, impaired UE functional use,  improper body mechanics, and postural dysfunction.   ACTIVITY LIMITATIONS: {activitylimitations:27494}  PARTICIPATION LIMITATIONS: {participationrestrictions:25113}  PERSONAL FACTORS: {Personal factors:25162} are also affecting patient's functional outcome.   REHAB POTENTIAL: {rehabpotential:25112}  CLINICAL DECISION MAKING: {clinical decision making:25114}  EVALUATION COMPLEXITY: {Evaluation complexity:25115}  PLAN:  PT FREQUENCY: {rehab frequency:25116}  PT DURATION: {rehab duration:25117}  PLANNED INTERVENTIONS: {rehab planned interventions:25118::"97110-Therapeutic exercises","97530- Therapeutic [phone]- Neuromuscular re-education","97535- Self UVOZ","36644- Manual therapy"}  PLAN FOR NEXT SESSION: ***   Antwion Carpenter April Ma L Sally-Ann Cutbirth, PT 07/17/2023, 1:13 PM

## 2023-07-19 ENCOUNTER — Other Ambulatory Visit: Payer: MEDICAID

## 2023-07-19 ENCOUNTER — Inpatient Hospital Stay: Admission: RE | Admit: 2023-07-19 | Payer: MEDICAID | Source: Ambulatory Visit

## 2023-07-22 ENCOUNTER — Ambulatory Visit: Payer: 59 | Admitting: Physical Therapy

## 2023-07-22 ENCOUNTER — Encounter: Payer: Self-pay | Admitting: Physical Therapy

## 2023-07-22 DIAGNOSIS — M25642 Stiffness of left hand, not elsewhere classified: Secondary | ICD-10-CM

## 2023-07-22 DIAGNOSIS — M6281 Muscle weakness (generalized): Secondary | ICD-10-CM | POA: Diagnosis not present

## 2023-07-22 DIAGNOSIS — R2689 Other abnormalities of gait and mobility: Secondary | ICD-10-CM | POA: Diagnosis not present

## 2023-07-22 DIAGNOSIS — I69354 Hemiplegia and hemiparesis following cerebral infarction affecting left non-dominant side: Secondary | ICD-10-CM | POA: Diagnosis not present

## 2023-07-22 NOTE — Therapy (Signed)
OUTPATIENT PHYSICAL THERAPY NEURO TREATMENT   Patient Name: Roberta Calderon MRN: [id number] DOB:[birth year], 52 y.o., female Today's Date: 07/22/2023   PCP: Grayce Sessions, NP REFERRING PROVIDER: Grayce Sessions, NP  END OF SESSION:  PT End of Session - 07/22/23 0855     Visit Number 2    Number of Visits 17    Date for PT Re-Evaluation 09/12/23    Authorization Type [facility] Medicaid    Authorization Time Period Auth request emailed to Jonny Ruiz 07/18/23    Authorization - Number of Visits 27    PT Start Time 0852    PT Stop Time 0930    PT Time Calculation (min) 38 min    Activity Tolerance Patient tolerated treatment well              Past Medical History:  Diagnosis Date   Cocaine abuse (HCC) 08/14/2021   Hypertension    Non-insulin dependent type 2 diabetes mellitus (HCC) 02/01/2023   Stroke (cerebrum) (HCC) 04/10/2022   History reviewed. No pertinent surgical history. Patient Active Problem List   Diagnosis Date Noted   Small vessel cerebrovascular accident (CVA) (HCC) 02/04/2023   Non-insulin dependent type 2 diabetes mellitus (HCC) 02/01/2023   Stroke (cerebrum) (HCC) 04/10/2022   Acute CVA (cerebrovascular accident) (HCC) 02/05/2022   Cerebrovascular accident (CVA) of right basal ganglia (HCC) 08/14/2021   Hypertensive urgency 08/14/2021   Cocaine abuse (HCC) 08/14/2021   Metabolic syndrome 08/14/2021   Right sided weakness 08/14/2021   Alcohol abuse 08/14/2021   Prolonged QT interval 08/14/2021   ICH (intracerebral hemorrhage) (HCC) 02/07/2021   Dyslipidemia    Right thalamic infarction (HCC) 04/08/2020   Prediabetes    Polysubstance abuse (HCC)    Hemiparesis affecting left side as late effect of stroke (HCC)    Ischemic stroke (HCC) 04/06/2020   Hypertension 04/06/2020   Tobacco use disorder 04/06/2020    ONSET DATE: 01/31/23 new CVA  REFERRING DIAG: W09.811 (ICD-10-CM) - Hemiparesis affecting left side as late effect of stroke  (HCC)  THERAPY DIAG:  Muscle weakness (generalized)  Other abnormalities of gait and mobility  Stiffness of left hand, not elsewhere classified  Rationale for Evaluation and Treatment: Rehabilitation  SUBJECTIVE:                                                                                                                                                                                             SUBJECTIVE STATEMENT: Pt states she is feeling stiff today. Has done her exercises at home.  Pt accompanied by: self  PERTINENT HISTORY: presented to the [facility] ED on 01/31/2023 complaining of  new onset of left sided weakness and slurred speech. Symptoms started on 6/05. She is maintained on Plavix due to prior stroke. PMH significant for hypertension, tobacco use and DM-2.  PAIN:  Are you having pain? No  PRECAUTIONS: Fall  RED FLAGS: None   WEIGHT BEARING RESTRICTIONS: No  FALLS: Has patient fallen in last 6 months? Yes. Number of falls 1 -- when she had a CVA  LIVING ENVIRONMENT: Lives with:  mom Lives in: House/apartment Stairs: No Has following equipment at home: None  PLOF: Independent  PATIENT GOALS: Improving L side mobility and use  OBJECTIVE:  Note: Objective measures were completed at Evaluation unless otherwise noted.  DIAGNOSTIC FINDINGS: 02/01/23 Brain MRI IMPRESSION: 1. 9 mm acute ischemic nonhemorrhagic infarct involving the posterior right frontal centrum semi ovale. 2. Underlying moderate to advanced chronic microvascular ischemic disease with multiple remote lacunar infarcts involving the bilateral basal ganglia, thalami, pons, and cerebellum. 3. Multiple scattered chronic micro hemorrhages as above, she most characteristic of chronic poorly controlled hypertension.  COORDINATION:  Unable to do heel to shin on L due to weakness  POSTURE: increased lumbar lordosis  UPPER EXTREMITY MMT: TBA  MMT Right eval Left eval  Shoulder flexion    Shoulder  extension    Shoulder abduction    Shoulder adduction    Shoulder extension    Shoulder internal rotation    Shoulder external rotation    Middle trapezius    Lower trapezius    Elbow flexion    Elbow extension    Wrist flexion    Wrist extension    Wrist ulnar deviation    Wrist radial deviation    Wrist pronation    Wrist supination    Grip strength     (Blank rows = not tested)   LOWER EXTREMITY MMT:    MMT Right Eval Left Eval  Hip flexion 4 2  Hip extension 3- 1  Hip abduction 3- 2-  Hip adduction    Hip internal rotation    Hip external rotation    Knee flexion 5 4  Knee extension 5 4-  Ankle dorsiflexion    Ankle plantarflexion    Ankle inversion    Ankle eversion    (Blank rows = not tested)  BED MOBILITY:  Slow and guarded with rolling L<>R and sup<>sit. Increased time required for trunk negotiation with sup to sit Difficulty rolling R side to prone but able to roll L side to prone  GAIT: Gait pattern:  Step through pattern, decreased L LE stance time with mild ER, hip drop with L stance phase Distance walked: x100' Assistive device utilized: None Level of assistance: Complete Independence  FUNCTIONAL TESTS:  5 times sit to stand: 23.13 sec Berg Balance Scale: 35/56 TUG 22.5 sec  PATIENT SURVEYS:  The Patient-Specific Functional Scale  Initial:  I am going to ask you to identify up to 3 important activities that you are unable to do or are having difficulty with as a result of this problem.  Today are there any activities that you are unable to do or having difficulty with because of this?  (Patient shown scale and patient rated each activity)  Follow up: When you first came in you had difficulty performing these activities.  Today do you still have difficulty?  Patient-Specific activity scoring scheme (Point to one number):  0 1 2 3 4 5 6 7 8 9  10 Unable  Able to perform To perform                                                                                                    activity at the same Activity         Level as before                                                                                                                       Injury or problem Activity Initial (eval): Follow up:  Getting in /out of bed 5   2.   Getting in/out of shower    5   3.   Doing dishes 5   Total 15/3 = 5        TODAY'S TREATMENT:                                                                                                                              DATE:  Therex Nustep L5 x 5 min LEs only Supine bridge 2x10 Supine SLR 2x10 Supine hip abd 2x10 Supine LTR x10 Quadruped alternating leg extensions 2x10 Quadruped fire hydrant 2x10 Neuro re-ed Forwards ambulation working on heel strike and toe off One foot on 9 lb med ball and roll it fwd/bwd for coordination/balance 2x10 One foot on 9 lb med ball and static standing for balance 2x20 sec   PATIENT EDUCATION: Education details: Exam findings, POC, initial HEP Person educated: Patient Education method: Explanation, Demonstration, and Handouts Education comprehension: verbalized understanding, returned demonstration, and needs further education  HOME EXERCISE PROGRAM: Access Code: [email or web address] URL: [email or web address] Date: 07/17/2023 Prepared by: Vernon Prey April Kirstie Peri  Exercises - Supine Bridge  - 1 x daily - 7 x weekly - 2 sets - 10 reps - Supine Active Straight Leg Raise  - 1 x daily - 7 x weekly - 2 sets - 10 reps - Quadruped Alternating Leg Extensions  - 1 x daily - 7 x weekly - 2 sets - 10 reps - Quadruped Academic librarian  - 1  x daily - 7 x weekly - 2 sets - 10 reps  GOALS: Goals reviewed with patient? Yes  SHORT TERM GOALS: Target date: 08/14/2023   Pt will be ind with initial HEP Baseline: Goal status: INITIAL  2.  Pt will have  improved 5x STS to </=20 sec Baseline:  Goal status: INITIAL  3.  Pt will have improved TUG to </=18 sec Baseline:  Goal status: INITIAL    LONG TERM GOALS: Target date: 09/11/2023   Pt will be ind with management and progression of HEP Baseline:  Goal status: INITIAL  2.  Pt will have improved PSFS average to >/=7 to demo MCID Baseline:  Goal status: INITIAL  3.  Pt will have improved Berg Balance Score to >/=43/56 to demo MCID Baseline:  Goal status: INITIAL  4.  Pt will have improved TUG to </=15 sec to demo decreased fall risk Baseline:  Goal status: INITIAL  5.  Pt will have improved 5x STS to </=13 sec to demo low fall risk Baseline:  Goal status: INITIAL    ASSESSMENT:  CLINICAL IMPRESSION: Reviewed pt's HEP. Continued L side strengthening as pt tolerated. Pt requires encouragement when challenged to perform a more difficult task (she tends to say "I can't" and stops the exercise). Pt is demonstrating improving L LE extension and SLR compared to initial eval. Worked on L LE stabilization and coordination.   From eval: Patient is a 52 y.o. F who was seen today for physical therapy evaluation and treatment for CVA with L side weakness. Pt notes she had a new CVA last month but didn't go to the hospital. She states she saw her PCP instead and has noted decreased L side strength. Assessment significant for L LE and UE weakness, decreased balance, and decreased endurance affecting functional mobility, t/fs, and amb. Pt will benefit from PT to address these deficits for improved function at home and decreased fall risk.   OBJECTIVE IMPAIRMENTS: Abnormal gait, decreased activity tolerance, decreased balance, decreased coordination, decreased endurance, decreased mobility, difficulty walking, decreased ROM, decreased strength, impaired UE functional use, improper body mechanics, and postural dysfunction.    PLAN:  PT FREQUENCY: 1-2x/week  PT DURATION: 8 weeks  PLANNED  INTERVENTIONS: 97164- PT Re-evaluation, 97110-Therapeutic exercises, 97530- Therapeutic activity, [id number]- Neuromuscular re-education, 97535- Self Care, 40981- Manual therapy, [id number]- Gait training, 97014- Electrical stimulation (unattended), [phone]- Ionotophoresis 4mg /ml Dexamethasone, Patient/Family education, Balance training, Stair training, Taping, Vestibular training, Cryotherapy, and Moist heat  PLAN FOR NEXT SESSION: Review and update HEP as able. Work on L LE strengthening and stability/balance. Work on transfers.    Mekiah Cambridge April Ma L Luman Holway, PT 07/22/2023, 8:56 AM    For all possible CPT codes, reference the Planned Interventions line above.     Check all conditions that are expected to impact treatment: {Conditions expected to impact treatment:Neurological condition and/or seizures   If treatment provided at initial evaluation, no treatment charged due to lack of authorization.

## 2023-07-24 ENCOUNTER — Ambulatory Visit: Payer: 59 | Admitting: Physical Therapy

## 2023-07-29 ENCOUNTER — Ambulatory Visit: Payer: 59 | Attending: Primary Care | Admitting: Physical Therapy

## 2023-07-29 ENCOUNTER — Encounter: Payer: Self-pay | Admitting: Physical Therapy

## 2023-07-29 ENCOUNTER — Telehealth: Payer: Self-pay | Admitting: Physical Therapy

## 2023-07-29 VITALS — BP 156/96 | HR 71

## 2023-07-29 DIAGNOSIS — R2689 Other abnormalities of gait and mobility: Secondary | ICD-10-CM | POA: Insufficient documentation

## 2023-07-29 DIAGNOSIS — R278 Other lack of coordination: Secondary | ICD-10-CM | POA: Insufficient documentation

## 2023-07-29 DIAGNOSIS — M6281 Muscle weakness (generalized): Secondary | ICD-10-CM | POA: Diagnosis not present

## 2023-07-29 NOTE — Telephone Encounter (Signed)
Roberta Calderon was evaluated by PT on 07/17/2023.  The patient would benefit from ST evaluation for slurred speech.   If you agree, please place an order in [facility] workque in [facility] or fax the order to [phone]. Thank you, Camille Bal, PT, DPT  [facility] [address] [place], [place]  [address] Phone:  [phone] Fax:  [phone]

## 2023-07-29 NOTE — Patient Instructions (Signed)
Access Code: [email or web address] URL: [email or web address] Date: 07/29/2023 Prepared by: Camille Bal  Exercises - Supine Bridge  - 1 x daily - 7 x weekly - 2 sets - 10 reps - Supine Active Straight Leg Raise  - 1 x daily - 7 x weekly - 2 sets - 10 reps - Quadruped Alternating Leg Extensions  - 1 x daily - 7 x weekly - 2 sets - 10 reps - Quadruped Fire Hydrant  - 1 x daily - 7 x weekly - 2 sets - 10 reps - Sit to Stand with Armchair  - 1 x daily - 7 x weekly - 3 sets - 10 reps - Standing Single Leg Stance with Counter Support  - 1 x daily - 7 x weekly - 1 sets - 3 reps - 45 seconds hold - Forward Reach Using Hip Strategy Forward Backward  - 1 x daily - 7 x weekly - 3 sets - 10 reps

## 2023-07-29 NOTE — Therapy (Signed)
OUTPATIENT PHYSICAL THERAPY NEURO TREATMENT   Patient Name: Roberta Calderon MRN: [id number] DOB:Apr 08, 1971, 52 y.o., female Today's Date: 07/29/2023   PCP: Grayce Sessions, NP REFERRING PROVIDER: Grayce Sessions, NP  END OF SESSION:  PT End of Session - 07/29/23 0852     Visit Number 3    Number of Visits 17    Date for PT Re-Evaluation 09/12/23    Authorization Type [employer]    Authorization Time Period Auth request emailed to Jonny Ruiz 07/18/23    Authorization - Number of Visits 27    PT Start Time 0845    PT Stop Time 0925   pt in restroom x4 minutes, not billed for   PT Time Calculation (min) 40 min    Equipment Utilized During Treatment Gait belt    Activity Tolerance Patient tolerated treatment well    Behavior During Therapy Flat affect;Agitated              Past Medical History:  Diagnosis Date   Cocaine abuse (HCC) 08/14/2021   Hypertension    Non-insulin dependent type 2 diabetes mellitus (HCC) 02/01/2023   Stroke (cerebrum) (HCC) 04/10/2022   History reviewed. No pertinent surgical history. Patient Active Problem List   Diagnosis Date Noted   Small vessel cerebrovascular accident (CVA) (HCC) 02/04/2023   Non-insulin dependent type 2 diabetes mellitus (HCC) 02/01/2023   Stroke (cerebrum) (HCC) 04/10/2022   Acute CVA (cerebrovascular accident) (HCC) 02/05/2022   Cerebrovascular accident (CVA) of right basal ganglia (HCC) 08/14/2021   Hypertensive urgency 08/14/2021   Cocaine abuse (HCC) 08/14/2021   Metabolic syndrome 08/14/2021   Right sided weakness 08/14/2021   Alcohol abuse 08/14/2021   Prolonged QT interval 08/14/2021   ICH (intracerebral hemorrhage) (HCC) 02/07/2021   Dyslipidemia    Right thalamic infarction (HCC) 04/08/2020   Prediabetes    Polysubstance abuse (HCC)    Hemiparesis affecting left side as late effect of stroke (HCC)    Ischemic stroke (HCC) 04/06/2020   Hypertension 04/06/2020   Tobacco use disorder  04/06/2020    ONSET DATE: 01/31/23 new CVA  REFERRING DIAG: X52.841 (ICD-10-CM) - Hemiparesis affecting left side as late effect of stroke (HCC)  THERAPY DIAG:  Muscle weakness (generalized)  Other abnormalities of gait and mobility  Other lack of coordination  Rationale for Evaluation and Treatment: Rehabilitation  SUBJECTIVE:                                                                                                                                                                                             SUBJECTIVE STATEMENT: Pt states she felt stiff getting out  of the bed this morning, but this has been better since.  She denies falls or acute changes and is having no pain or headache today.  Pt accompanied by: self  PERTINENT HISTORY: presented to the [facility] ED on 01/31/2023 complaining of new onset of left sided weakness and slurred speech. Symptoms started on 6/05. She is maintained on Plavix due to prior stroke. PMH significant for hypertension, tobacco use and DM-2.  PAIN:  Are you having pain? No  PRECAUTIONS: Fall  RED FLAGS: None   WEIGHT BEARING RESTRICTIONS: No  FALLS: Has patient fallen in last 6 months? Yes. Number of falls 1 -- when she had a CVA  LIVING ENVIRONMENT: Lives with:  mom Lives in: House/apartment Stairs: No Has following equipment at home: None  PLOF: Independent  PATIENT GOALS: Improving L side mobility and use  OBJECTIVE:  Note: Objective measures were completed at Evaluation unless otherwise noted.  DIAGNOSTIC FINDINGS: 02/01/23 Brain MRI IMPRESSION: 1. 9 mm acute ischemic nonhemorrhagic infarct involving the posterior right frontal centrum semi ovale. 2. Underlying moderate to advanced chronic microvascular ischemic disease with multiple remote lacunar infarcts involving the bilateral basal ganglia, thalami, pons, and cerebellum. 3. Multiple scattered chronic micro hemorrhages as above, she most characteristic of chronic poorly  controlled hypertension.  COORDINATION:  Unable to do heel to shin on L due to weakness  POSTURE: increased lumbar lordosis  UPPER EXTREMITY MMT: TBA  MMT Right eval Left eval  Shoulder flexion    Shoulder extension    Shoulder abduction    Shoulder adduction    Shoulder extension    Shoulder internal rotation    Shoulder external rotation    Middle trapezius    Lower trapezius    Elbow flexion    Elbow extension    Wrist flexion    Wrist extension    Wrist ulnar deviation    Wrist radial deviation    Wrist pronation    Wrist supination    Grip strength     (Blank rows = not tested)   LOWER EXTREMITY MMT:    MMT Right Eval Left Eval  Hip flexion 4 2  Hip extension 3- 1  Hip abduction 3- 2-  Hip adduction    Hip internal rotation    Hip external rotation    Knee flexion 5 4  Knee extension 5 4-  Ankle dorsiflexion    Ankle plantarflexion    Ankle inversion    Ankle eversion    (Blank rows = not tested)  BED MOBILITY:  Slow and guarded with rolling L<>R and sup<>sit. Increased time required for trunk negotiation with sup to sit Difficulty rolling R side to prone but able to roll L side to prone  GAIT: Gait pattern:  Step through pattern, decreased L LE stance time with mild ER, hip drop with L stance phase Distance walked: x100' Assistive device utilized: None Level of assistance: Complete Independence  FUNCTIONAL TESTS:  5 times sit to stand: 23.13 sec Berg Balance Scale: 35/56 TUG 22.5 sec  PATIENT SURVEYS:  The Patient-Specific Functional Scale  Initial:  I am going to ask you to identify up to 3 important activities that you are unable to do or are having difficulty with as a result of this problem.  Today are there any activities that you are unable to do or having difficulty with because of this?  (Patient shown scale and patient rated each activity)  Follow up: When you first came in you had difficulty performing  these activities.  Today do  you still have difficulty?  Patient-Specific activity scoring scheme (Point to one number):  0 1 2 3 4 5 6 7 8 9  10 Unable                                                                                                          Able to perform To perform                                                                                                    activity at the same Activity         Level as before                                                                                                                       Injury or problem Activity Initial (eval): Follow up:  Getting in /out of bed 5   2.   Getting in/out of shower    5   3.   Doing dishes 5   Total 15/3 = 5        TODAY'S TREATMENT:                                                                                                                              DATE: 07/29/2023  STS w/ BUE support 2x10; cued to improve immediate standing posture and controlled descent PT ambulates with pt to restroom, pt in restroom x4 minutes (not billed for) SLS at counter 2x45 seconds each LE, discussed ways to progress at home Forward reaching at countertop in  progressed range 3x10; on return to mat table pt has major LOB due to left toe catch without adequate stepping strategy to recover requiring maxA to return upright, pt denies feeling weak or dizzy after activity 4" step taps unsupported CGA x20 > 6" step taps unsupported CGA-modA (pt has single major forward LOB when not clearing RLE requiring modA to recover) 2x20 Massed practice transfers from armchair <> mat table x3; pt states "I'm finished!  I'm done."  Following first round and states she is leaving while initiating removal of gait belt.  PT ambulates pt to front lobby for safety due to instability.  PATIENT EDUCATION: Education details:  Discussed pt returning to using cane due to instability noted in session today.  Confirmed she has a cane and feels she should use it  sometimes, but just doesn't.  Pt requests referral for ST due to slurred speech.  PT to request. Person educated: Patient Education method: Explanation, Demonstration, and Handouts Education comprehension: verbalized understanding, returned demonstration, and needs further education  HOME EXERCISE PROGRAM: Access Code: [email or web address] URL: [email or web address] Date: 07/17/2023 Prepared by: Vernon Prey April Kirstie Peri  Exercises - Supine Bridge  - 1 x daily - 7 x weekly - 2 sets - 10 reps - Supine Active Straight Leg Raise  - 1 x daily - 7 x weekly - 2 sets - 10 reps - Quadruped Alternating Leg Extensions  - 1 x daily - 7 x weekly - 2 sets - 10 reps - Quadruped Fire Hydrant  - 1 x daily - 7 x weekly - 2 sets - 10 reps - Sit to Stand with Armchair  - 1 x daily - 7 x weekly - 3 sets - 10 reps - Standing Single Leg Stance with Counter Support  - 1 x daily - 7 x weekly - 1 sets - 3 reps - 45 seconds hold - Forward Reach Using Hip Strategy Forward Backward  - 1 x daily - 7 x weekly - 3 sets - 10 reps  GOALS: Goals reviewed with patient? Yes  SHORT TERM GOALS: Target date: 08/14/2023   Pt will be ind with initial HEP Baseline: Goal status: INITIAL  2.  Pt will have improved 5x STS to </=20 sec Baseline:  Goal status: INITIAL  3.  Pt will have improved TUG to </=18 sec Baseline:  Goal status: INITIAL    LONG TERM GOALS: Target date: 09/11/2023   Pt will be ind with management and progression of HEP Baseline:  Goal status: INITIAL  2.  Pt will have improved PSFS average to >/=7 to demo MCID Baseline:  Goal status: INITIAL  3.  Pt will have improved Berg Balance Score to >/=43/56 to demo MCID Baseline:  Goal status: INITIAL  4.  Pt will have improved TUG to </=15 sec to demo decreased fall risk Baseline:  Goal status: INITIAL  5.  Pt will have improved 5x STS to </=13 sec to demo low fall risk Baseline:  Goal status: INITIAL    ASSESSMENT:  CLINICAL  IMPRESSION: Continued to progress patient's HEP to improve LE strength and L NMR.  She was challenged by SLS activities this visit with 2 major LOB during and following challenging tasks.  She demonstrates some general softness of bilateral LE during ambulation making PT question if cane would be appropriate to return to.  She would benefit from further assessment and practice of this in future session.  Will continue per POC.  From eval: Patient is a 52 y.o. F who  was seen today for physical therapy evaluation and treatment for CVA with L side weakness. Pt notes she had a new CVA last month but didn't go to the hospital. She states she saw her PCP instead and has noted decreased L side strength. Assessment significant for L LE and UE weakness, decreased balance, and decreased endurance affecting functional mobility, t/fs, and amb. Pt will benefit from PT to address these deficits for improved function at home and decreased fall risk.   OBJECTIVE IMPAIRMENTS: Abnormal gait, decreased activity tolerance, decreased balance, decreased coordination, decreased endurance, decreased mobility, difficulty walking, decreased ROM, decreased strength, impaired UE functional use, improper body mechanics, and postural dysfunction.    PLAN:  PT FREQUENCY: 1-2x/week  PT DURATION: 8 weeks  PLANNED INTERVENTIONS: 97164- PT Re-evaluation, 97110-Therapeutic exercises, 97530- Therapeutic activity, [id number]- Neuromuscular re-education, 97535- Self Care, 52841- Manual therapy, [id number]- Gait training, 97014- Electrical stimulation (unattended), [phone]- Ionotophoresis 4mg /ml Dexamethasone, Patient/Family education, Balance training, Stair training, Taping, Vestibular training, Cryotherapy, and Moist heat  PLAN FOR NEXT SESSION: Review and update HEP as able. Work on L LE strengthening and stability/balance. Work on transfers.  Stepping strategy.  Did she bring cane? - review proper use.   Sadie Haber, PT,  DPT 07/29/2023, 9:29 AM    For all possible CPT codes, reference the Planned Interventions line above.     Check all conditions that are expected to impact treatment: {Conditions expected to impact treatment:Neurological condition and/or seizures   If treatment provided at initial evaluation, no treatment charged due to lack of authorization.

## 2023-07-31 ENCOUNTER — Ambulatory Visit: Payer: 59 | Admitting: Physical Therapy

## 2023-07-31 DIAGNOSIS — R2689 Other abnormalities of gait and mobility: Secondary | ICD-10-CM

## 2023-07-31 DIAGNOSIS — M6281 Muscle weakness (generalized): Secondary | ICD-10-CM

## 2023-07-31 DIAGNOSIS — R278 Other lack of coordination: Secondary | ICD-10-CM | POA: Diagnosis not present

## 2023-07-31 NOTE — Therapy (Signed)
OUTPATIENT PHYSICAL THERAPY NEURO TREATMENT   Patient Name: Roberta Calderon MRN: [id number] DOB:1971-06-04, 52 y.o., female Today's Date: 07/31/2023   PCP: Grayce Sessions, NP REFERRING PROVIDER: Grayce Sessions, NP  END OF SESSION:  PT End of Session - 07/31/23 0820     Visit Number 4    Number of Visits 17    Date for PT Re-Evaluation 09/12/23    Authorization Type [facility] Medicaid    Authorization Time Period Auth request emailed to Jonny Ruiz 07/18/23    Authorization - Visit Number 3    Authorization - Number of Visits 27    PT Start Time 0825    PT Stop Time 0905    PT Time Calculation (min) 40 min    Equipment Utilized During Treatment Gait belt    Activity Tolerance Patient tolerated treatment well    Behavior During Therapy Flat affect;Agitated               Past Medical History:  Diagnosis Date   Cocaine abuse (HCC) 08/14/2021   Hypertension    Non-insulin dependent type 2 diabetes mellitus (HCC) 02/01/2023   Stroke (cerebrum) (HCC) 04/10/2022   No past surgical history on file. Patient Active Problem List   Diagnosis Date Noted   Small vessel cerebrovascular accident (CVA) (HCC) 02/04/2023   Non-insulin dependent type 2 diabetes mellitus (HCC) 02/01/2023   Stroke (cerebrum) (HCC) 04/10/2022   Acute CVA (cerebrovascular accident) (HCC) 02/05/2022   Cerebrovascular accident (CVA) of right basal ganglia (HCC) 08/14/2021   Hypertensive urgency 08/14/2021   Cocaine abuse (HCC) 08/14/2021   Metabolic syndrome 08/14/2021   Right sided weakness 08/14/2021   Alcohol abuse 08/14/2021   Prolonged QT interval 08/14/2021   ICH (intracerebral hemorrhage) (HCC) 02/07/2021   Dyslipidemia    Right thalamic infarction (HCC) 04/08/2020   Prediabetes    Polysubstance abuse (HCC)    Hemiparesis affecting left side as late effect of stroke (HCC)    Ischemic stroke (HCC) 04/06/2020   Hypertension 04/06/2020   Tobacco use disorder 04/06/2020    ONSET DATE:  01/31/23 new CVA  REFERRING DIAG: N82.956 (ICD-10-CM) - Hemiparesis affecting left side as late effect of stroke (HCC)  THERAPY DIAG:  Muscle weakness (generalized)  Other abnormalities of gait and mobility  Other lack of coordination  Rationale for Evaluation and Treatment: Rehabilitation  SUBJECTIVE:                                                                                                                                                                                             SUBJECTIVE STATEMENT: Pt denies any falls. No new complaints.  States she has been doing her exercises.  Pt accompanied by: self  PERTINENT HISTORY: presented to the [facility] ED on 01/31/2023 complaining of new onset of left sided weakness and slurred speech. Symptoms started on 6/05. She is maintained on Plavix due to prior stroke. PMH significant for hypertension, tobacco use and DM-2.  PAIN:  Are you having pain? No  PRECAUTIONS: Fall  RED FLAGS: None   WEIGHT BEARING RESTRICTIONS: No  FALLS: Has patient fallen in last 6 months? Yes. Number of falls 1 -- when she had a CVA  LIVING ENVIRONMENT: Lives with:  mom Lives in: House/apartment Stairs: No Has following equipment at home: None  PLOF: Independent  PATIENT GOALS: Improving L side mobility and use  OBJECTIVE:  Note: Objective measures were completed at Evaluation unless otherwise noted.  DIAGNOSTIC FINDINGS: 02/01/23 Brain MRI IMPRESSION: 1. 9 mm acute ischemic nonhemorrhagic infarct involving the posterior right frontal centrum semi ovale. 2. Underlying moderate to advanced chronic microvascular ischemic disease with multiple remote lacunar infarcts involving the bilateral basal ganglia, thalami, pons, and cerebellum. 3. Multiple scattered chronic micro hemorrhages as above, she most characteristic of chronic poorly controlled hypertension.  COORDINATION:  Unable to do heel to shin on L due to weakness  POSTURE: increased lumbar  lordosis  UPPER EXTREMITY MMT: TBA  MMT Right eval Left eval  Shoulder flexion    Shoulder extension    Shoulder abduction    Shoulder adduction    Shoulder extension    Shoulder internal rotation    Shoulder external rotation    Middle trapezius    Lower trapezius    Elbow flexion    Elbow extension    Wrist flexion    Wrist extension    Wrist ulnar deviation    Wrist radial deviation    Wrist pronation    Wrist supination    Grip strength     (Blank rows = not tested)   LOWER EXTREMITY MMT:    MMT Right Eval Left Eval  Hip flexion 4 2  Hip extension 3- 1  Hip abduction 3- 2-  Hip adduction    Hip internal rotation    Hip external rotation    Knee flexion 5 4  Knee extension 5 4-  Ankle dorsiflexion    Ankle plantarflexion    Ankle inversion    Ankle eversion    (Blank rows = not tested)  BED MOBILITY:  Slow and guarded with rolling L<>R and sup<>sit. Increased time required for trunk negotiation with sup to sit Difficulty rolling R side to prone but able to roll L side to prone  GAIT: Gait pattern:  Step through pattern, decreased L LE stance time with mild ER, hip drop with L stance phase Distance walked: x100' Assistive device utilized: None Level of assistance: Complete Independence  FUNCTIONAL TESTS:  5 times sit to stand: 23.13 sec Berg Balance Scale: 35/56 TUG 22.5 sec  PATIENT SURVEYS:  The Patient-Specific Functional Scale  Initial:  I am going to ask you to identify up to 3 important activities that you are unable to do or are having difficulty with as a result of this problem.  Today are there any activities that you are unable to do or having difficulty with because of this?  (Patient shown scale and patient rated each activity)  Follow up: When you first came in you had difficulty performing these activities.  Today do you still have difficulty?  Patient-Specific activity scoring scheme (Point to one  number):  0 1 2 3 4 5 6 7 8 9  10 Unable                                                                                                          Able to perform To perform                                                                                                    activity at the same Activity         Level as before                                                                                                                       Injury or problem Activity Initial (eval): Follow up:  Getting in /out of bed 5   2.   Getting in/out of shower    5   3.   Doing dishes 5   Total 15/3 = 5        TODAY'S TREATMENT:                                                                                                                              DATE: 07/31/2023 Nustep L5 x 5 min LEs only to encourage push through L LE Leg press 60# 2x10 double leg, 30# x10 single leg (L only) Quadruped hip ext x10, with yellow TB x10 Quadruped fire hydrant yellow TB x10 Tandem stance 2x30" R&L Attempted alternating forward cone taps; however pt states "I can't. I'm done."   PATIENT EDUCATION: Education details:  Discussed pt returning to using  cane due to instability noted in session today.  Confirmed she has a cane and feels she should use it sometimes, but just doesn't.  Pt requests referral for ST due to slurred speech.  PT to request. Person educated: Patient Education method: Explanation, Demonstration, and Handouts Education comprehension: verbalized understanding, returned demonstration, and needs further education  HOME EXERCISE PROGRAM: Access Code: [email or web address] URL: [email or web address] Date: 07/17/2023 Prepared by: Vernon Prey April Kirstie Peri  Exercises - Supine Bridge  - 1 x daily - 7 x weekly - 2 sets - 10 reps - Supine Active Straight Leg Raise  - 1 x daily - 7 x weekly - 2 sets - 10 reps - Quadruped Alternating Leg Extensions  - 1 x daily - 7 x weekly - 2 sets - 10 reps -  Quadruped Fire Hydrant  - 1 x daily - 7 x weekly - 2 sets - 10 reps - Sit to Stand with Armchair  - 1 x daily - 7 x weekly - 3 sets - 10 reps - Standing Single Leg Stance with Counter Support  - 1 x daily - 7 x weekly - 1 sets - 3 reps - 45 seconds hold - Forward Reach Using Hip Strategy Forward Backward  - 1 x daily - 7 x weekly - 3 sets - 10 reps  GOALS: Goals reviewed with patient? Yes  SHORT TERM GOALS: Target date: 08/14/2023   Pt will be ind with initial HEP Baseline: Goal status: INITIAL  2.  Pt will have improved 5x STS to </=20 sec Baseline:  Goal status: INITIAL  3.  Pt will have improved TUG to </=18 sec Baseline:  Goal status: INITIAL    LONG TERM GOALS: Target date: 09/11/2023   Pt will be ind with management and progression of HEP Baseline:  Goal status: INITIAL  2.  Pt will have improved PSFS average to >/=7 to demo MCID Baseline:  Goal status: INITIAL  3.  Pt will have improved Berg Balance Score to >/=43/56 to demo MCID Baseline:  Goal status: INITIAL  4.  Pt will have improved TUG to </=15 sec to demo decreased fall risk Baseline:  Goal status: INITIAL  5.  Pt will have improved 5x STS to </=13 sec to demo low fall risk Baseline:  Goal status: INITIAL    ASSESSMENT:  CLINICAL IMPRESSION: Progressed pt's quadruped exercises to include resistance. Session focused on balance and strength. Pt tolerates the strengthening well but self limits her balance due to FOF when she is challenged.   From eval: Patient is a 52 y.o. F who was seen today for physical therapy evaluation and treatment for CVA with L side weakness. Pt notes she had a new CVA last month but didn't go to the hospital. She states she saw her PCP instead and has noted decreased L side strength. Assessment significant for L LE and UE weakness, decreased balance, and decreased endurance affecting functional mobility, t/fs, and amb. Pt will benefit from PT to address these deficits for  improved function at home and decreased fall risk.   OBJECTIVE IMPAIRMENTS: Abnormal gait, decreased activity tolerance, decreased balance, decreased coordination, decreased endurance, decreased mobility, difficulty walking, decreased ROM, decreased strength, impaired UE functional use, improper body mechanics, and postural dysfunction.    PLAN:  PT FREQUENCY: 1-2x/week  PT DURATION: 8 weeks  PLANNED INTERVENTIONS: 97164- PT Re-evaluation, 97110-Therapeutic exercises, 97530- Therapeutic activity, [id number]- Neuromuscular re-education, 97535- Self Care, 47829- Manual therapy, [id number]- Gait training, 97014- Electrical stimulation (unattended), [id number]- Ionotophoresis 4mg /ml  Dexamethasone, Patient/Family education, Balance training, Stair training, Taping, Vestibular training, Cryotherapy, and Moist heat  PLAN FOR NEXT SESSION: Review and update HEP as able. Work on L LE strengthening and stability/balance. Work on transfers.  Stepping strategy.  Did she bring cane? - review proper use.   Laqueshia Cihlar April Ma L Desmen Schoffstall, PT, DPT 07/31/2023, 8:21 AM

## 2023-08-01 ENCOUNTER — Other Ambulatory Visit (INDEPENDENT_AMBULATORY_CARE_PROVIDER_SITE_OTHER): Payer: Self-pay | Admitting: Primary Care

## 2023-08-01 DIAGNOSIS — I639 Cerebral infarction, unspecified: Secondary | ICD-10-CM

## 2023-08-01 DIAGNOSIS — I6381 Other cerebral infarction due to occlusion or stenosis of small artery: Secondary | ICD-10-CM

## 2023-08-05 ENCOUNTER — Ambulatory Visit: Payer: 59 | Admitting: Physical Therapy

## 2023-08-05 ENCOUNTER — Encounter: Payer: Self-pay | Admitting: Physical Therapy

## 2023-08-05 VITALS — BP 137/78 | HR 70

## 2023-08-05 DIAGNOSIS — R2689 Other abnormalities of gait and mobility: Secondary | ICD-10-CM

## 2023-08-05 DIAGNOSIS — R278 Other lack of coordination: Secondary | ICD-10-CM

## 2023-08-05 DIAGNOSIS — M6281 Muscle weakness (generalized): Secondary | ICD-10-CM

## 2023-08-05 NOTE — Therapy (Signed)
OUTPATIENT PHYSICAL THERAPY NEURO TREATMENT   Patient Name: MARLII FROST MRN: [id number] DOB:1970-10-16, 52 y.o., female Today's Date: 08/05/2023   PCP: Grayce Sessions, NP REFERRING PROVIDER: Grayce Sessions, NP  END OF SESSION:  PT End of Session - 08/05/23 0858     Visit Number 5    Number of Visits 17    Date for PT Re-Evaluation 09/12/23    Authorization Type [facility] Medicaid    Authorization Time Period Auth request emailed to Jonny Ruiz 07/18/23    Authorization - Visit Number 4    Authorization - Number of Visits 27    PT Start Time 0846    PT Stop Time 0926    PT Time Calculation (min) 40 min    Equipment Utilized During Treatment Gait belt    Activity Tolerance Patient tolerated treatment well    Behavior During Therapy Flat affect               Past Medical History:  Diagnosis Date   Cocaine abuse (HCC) 08/14/2021   Hypertension    Non-insulin dependent type 2 diabetes mellitus (HCC) 02/01/2023   Stroke (cerebrum) (HCC) 04/10/2022   History reviewed. No pertinent surgical history. Patient Active Problem List   Diagnosis Date Noted   Small vessel cerebrovascular accident (CVA) (HCC) 02/04/2023   Non-insulin dependent type 2 diabetes mellitus (HCC) 02/01/2023   Stroke (cerebrum) (HCC) 04/10/2022   Acute CVA (cerebrovascular accident) (HCC) 02/05/2022   Cerebrovascular accident (CVA) of right basal ganglia (HCC) 08/14/2021   Hypertensive urgency 08/14/2021   Cocaine abuse (HCC) 08/14/2021   Metabolic syndrome 08/14/2021   Right sided weakness 08/14/2021   Alcohol abuse 08/14/2021   Prolonged QT interval 08/14/2021   ICH (intracerebral hemorrhage) (HCC) 02/07/2021   Dyslipidemia    Right thalamic infarction (HCC) 04/08/2020   Prediabetes    Polysubstance abuse (HCC)    Hemiparesis affecting left side as late effect of stroke (HCC)    Ischemic stroke (HCC) 04/06/2020   Hypertension 04/06/2020   Tobacco use disorder 04/06/2020    ONSET  DATE: 01/31/23 new CVA  REFERRING DIAG: M57.846 (ICD-10-CM) - Hemiparesis affecting left side as late effect of stroke (HCC)  THERAPY DIAG:  Muscle weakness (generalized)  Other abnormalities of gait and mobility  Other lack of coordination  Rationale for Evaluation and Treatment: Rehabilitation  SUBJECTIVE:                                                                                                                                                                                             SUBJECTIVE STATEMENT: Pt denies any falls. No new complaints.  She has been doing her HEP "a little bit".  Pt accompanied by: self  PERTINENT HISTORY: presented to the [facility] ED on 01/31/2023 complaining of new onset of left sided weakness and slurred speech. Symptoms started on 6/05. She is maintained on Plavix due to prior stroke. PMH significant for hypertension, tobacco use and DM-2.  PAIN:  Are you having pain? No  PRECAUTIONS: Fall  RED FLAGS: None   WEIGHT BEARING RESTRICTIONS: No  FALLS: Has patient fallen in last 6 months? Yes. Number of falls 1 -- when she had a CVA  LIVING ENVIRONMENT: Lives with:  mom Lives in: House/apartment Stairs: No Has following equipment at home: None  PLOF: Independent  PATIENT GOALS: Improving L side mobility and use  OBJECTIVE:  Note: Objective measures were completed at Evaluation unless otherwise noted.  DIAGNOSTIC FINDINGS: 02/01/23 Brain MRI IMPRESSION: 1. 9 mm acute ischemic nonhemorrhagic infarct involving the posterior right frontal centrum semi ovale. 2. Underlying moderate to advanced chronic microvascular ischemic disease with multiple remote lacunar infarcts involving the bilateral basal ganglia, thalami, pons, and cerebellum. 3. Multiple scattered chronic micro hemorrhages as above, she most characteristic of chronic poorly controlled hypertension.  COORDINATION:  Unable to do heel to shin on L due to weakness  POSTURE:  increased lumbar lordosis  UPPER EXTREMITY MMT: TBA  MMT Right eval Left eval  Shoulder flexion    Shoulder extension    Shoulder abduction    Shoulder adduction    Shoulder extension    Shoulder internal rotation    Shoulder external rotation    Middle trapezius    Lower trapezius    Elbow flexion    Elbow extension    Wrist flexion    Wrist extension    Wrist ulnar deviation    Wrist radial deviation    Wrist pronation    Wrist supination    Grip strength     (Blank rows = not tested)   LOWER EXTREMITY MMT:    MMT Right Eval Left Eval  Hip flexion 4 2  Hip extension 3- 1  Hip abduction 3- 2-  Hip adduction    Hip internal rotation    Hip external rotation    Knee flexion 5 4  Knee extension 5 4-  Ankle dorsiflexion    Ankle plantarflexion    Ankle inversion    Ankle eversion    (Blank rows = not tested)  BED MOBILITY:  Slow and guarded with rolling L<>R and sup<>sit. Increased time required for trunk negotiation with sup to sit Difficulty rolling R side to prone but able to roll L side to prone  GAIT: Gait pattern:  Step through pattern, decreased L LE stance time with mild ER, hip drop with L stance phase Distance walked: x100' Assistive device utilized: None Level of assistance: Complete Independence  FUNCTIONAL TESTS:  5 times sit to stand: 23.13 sec Berg Balance Scale: 35/56 TUG 22.5 sec  PATIENT SURVEYS:  The Patient-Specific Functional Scale  Initial:  I am going to ask you to identify up to 3 important activities that you are unable to do or are having difficulty with as a result of this problem.  Today are there any activities that you are unable to do or having difficulty with because of this?  (Patient shown scale and patient rated each activity)  Follow up: When you first came in you had difficulty performing these activities.  Today do you still have difficulty?  Patient-Specific activity scoring scheme (Point to one  number):  0 1 2 3 4 5 6 7 8 9  10 Unable                                                                                                          Able to perform To perform                                                                                                    activity at the same Activity         Level as before                                                                                                                       Injury or problem Activity Initial (eval): Follow up:  Getting in /out of bed 5   2.   Getting in/out of shower    5   3.   Doing dishes 5   Total 15/3 = 5        TODAY'S TREATMENT:                                                                                                                              DATE: 08/05/2023 SciFit w/ BLE only for strengthening and activity tolerance 4 sets of 2 minutes w/ variable rest (~1 minute each) b/w due to fatigue on level 1.0.  GAIT: Gait pattern: step through pattern, decreased arm swing- Left, trendelenburg, lateral lean- Left, and narrow BOS Distance walked: 200 ft + 125 ft Assistive device utilized: Single point cane Level of assistance: SBA and CGA Comments:  Pt has more persistent left limp with increased distance creating some need for CGA due to pt difficulty correcting with cues and denying pain during ambulation.  Discussed her using her cane at home as she has fair sequencing with cane, not 100% consistent over entire distance.  PT feels it could decrease fall risk for short distances and pt could benefit from further trials.  Pt is in agreement.  Repeated after performing separate activity and pt demonstrates better and more consistent cane sequencing.  She does maintain some left knee bend in stance w/ hip drop.  4> 6 > 8" step taps w/ light CGA x20 alt LE each set   PATIENT EDUCATION: Education details:  Scheduling ST eval at end of today's session.  Using SPC. Person educated: Patient Education  method: Explanation, Demonstration, and Handouts Education comprehension: verbalized understanding, returned demonstration, and needs further education  HOME EXERCISE PROGRAM: Access Code: [email or web address] URL: [email or web address] Date: 07/17/2023 Prepared by: Vernon Prey April Kirstie Peri  Exercises - Supine Bridge  - 1 x daily - 7 x weekly - 2 sets - 10 reps - Supine Active Straight Leg Raise  - 1 x daily - 7 x weekly - 2 sets - 10 reps - Quadruped Alternating Leg Extensions  - 1 x daily - 7 x weekly - 2 sets - 10 reps - Quadruped Fire Hydrant  - 1 x daily - 7 x weekly - 2 sets - 10 reps - Sit to Stand with Armchair  - 1 x daily - 7 x weekly - 3 sets - 10 reps - Standing Single Leg Stance with Counter Support  - 1 x daily - 7 x weekly - 1 sets - 3 reps - 45 seconds hold - Forward Reach Using Hip Strategy Forward Backward  - 1 x daily - 7 x weekly - 3 sets - 10 reps  GOALS: Goals reviewed with patient? Yes  SHORT TERM GOALS: Target date: 08/14/2023   Pt will be ind with initial HEP Baseline: Goal status: INITIAL  2.  Pt will have improved 5x STS to </=20 sec Baseline:  Goal status: INITIAL  3.  Pt will have improved TUG to </=18 sec Baseline:  Goal status: INITIAL    LONG TERM GOALS: Target date: 09/11/2023   Pt will be ind with management and progression of HEP Baseline:  Goal status: INITIAL  2.  Pt will have improved PSFS average to >/=7 to demo MCID Baseline:  Goal status: INITIAL  3.  Pt will have improved Berg Balance Score to >/=43/56 to demo MCID Baseline:  Goal status: INITIAL  4.  Pt will have improved TUG to </=15 sec to demo decreased fall risk Baseline:  Goal status: INITIAL  5.  Pt will have improved 5x STS to </=13 sec to demo low fall risk Baseline:  Goal status: INITIAL    ASSESSMENT:  CLINICAL IMPRESSION: Focus of skilled session today on improving pt safety with use of a cane as she has at home.  PT encouraged her to use her  cane for safety as she appears more steady with than without device at this time.  Ongoing attempts to improve SLS and coordination with step taps.  Her fear of falling appeared better today with larger target surface offering more support.  Will further attempt to progress her towards goals in coming visits.  From eval: Patient is a 52 y.o. F who was seen today for physical therapy evaluation and treatment for CVA with L side weakness. Pt  notes she had a new CVA last month but didn't go to the hospital. She states she saw her PCP instead and has noted decreased L side strength. Assessment significant for L LE and UE weakness, decreased balance, and decreased endurance affecting functional mobility, t/fs, and amb. Pt will benefit from PT to address these deficits for improved function at home and decreased fall risk.   OBJECTIVE IMPAIRMENTS: Abnormal gait, decreased activity tolerance, decreased balance, decreased coordination, decreased endurance, decreased mobility, difficulty walking, decreased ROM, decreased strength, impaired UE functional use, improper body mechanics, and postural dysfunction.    PLAN:  PT FREQUENCY: 1-2x/week  PT DURATION: 8 weeks  PLANNED INTERVENTIONS: 97164- PT Re-evaluation, 97110-Therapeutic exercises, 97530- Therapeutic activity, [id number]- Neuromuscular re-education, 97535- Self Care, 16109- Manual therapy, [id number]- Gait training, 97014- Electrical stimulation (unattended), [phone]- Ionotophoresis 4mg /ml Dexamethasone, Patient/Family education, Balance training, Stair training, Taping, Vestibular training, Cryotherapy, and Moist heat  PLAN FOR NEXT SESSION: Review and update HEP as able. Work on L LE strengthening and stability/balance. Work on transfers.  Stepping strategy.  Continue reviewing SPC sequencing/use.   Sadie Haber, PT, DPT 08/05/2023, 9:27 AM

## 2023-08-07 ENCOUNTER — Ambulatory Visit: Payer: 59 | Admitting: Physical Therapy

## 2023-08-07 DIAGNOSIS — M6281 Muscle weakness (generalized): Secondary | ICD-10-CM | POA: Diagnosis not present

## 2023-08-07 DIAGNOSIS — R2689 Other abnormalities of gait and mobility: Secondary | ICD-10-CM | POA: Diagnosis not present

## 2023-08-07 DIAGNOSIS — R278 Other lack of coordination: Secondary | ICD-10-CM | POA: Diagnosis not present

## 2023-08-07 NOTE — Therapy (Signed)
OUTPATIENT PHYSICAL THERAPY NEURO TREATMENT   Patient Name: Roberta Calderon MRN: [id number] DOB:01/01/1971, 52 y.o., female Today's Date: 08/07/2023   PCP: Grayce Sessions, NP REFERRING PROVIDER: Grayce Sessions, NP  END OF SESSION:  PT End of Session - 08/07/23 0848     Visit Number 6    Number of Visits 17    Date for PT Re-Evaluation 09/12/23    Authorization Type [employer] Medicaid    Authorization Time Period Auth request emailed to Jonny Ruiz 07/18/23    Authorization - Visit Number 5    Authorization - Number of Visits 27    PT Start Time 0848    PT Stop Time 0930    PT Time Calculation (min) 42 min    Equipment Utilized During Treatment Gait belt    Activity Tolerance Patient tolerated treatment well    Behavior During Therapy Flat affect               Past Medical History:  Diagnosis Date   Cocaine abuse (HCC) 08/14/2021   Hypertension    Non-insulin dependent type 2 diabetes mellitus (HCC) 02/01/2023   Stroke (cerebrum) (HCC) 04/10/2022   No past surgical history on file. Patient Active Problem List   Diagnosis Date Noted   Small vessel cerebrovascular accident (CVA) (HCC) 02/04/2023   Non-insulin dependent type 2 diabetes mellitus (HCC) 02/01/2023   Stroke (cerebrum) (HCC) 04/10/2022   Acute CVA (cerebrovascular accident) (HCC) 02/05/2022   Cerebrovascular accident (CVA) of right basal ganglia (HCC) 08/14/2021   Hypertensive urgency 08/14/2021   Cocaine abuse (HCC) 08/14/2021   Metabolic syndrome 08/14/2021   Right sided weakness 08/14/2021   Alcohol abuse 08/14/2021   Prolonged QT interval 08/14/2021   ICH (intracerebral hemorrhage) (HCC) 02/07/2021   Dyslipidemia    Right thalamic infarction (HCC) 04/08/2020   Prediabetes    Polysubstance abuse (HCC)    Hemiparesis affecting left side as late effect of stroke (HCC)    Ischemic stroke (HCC) 04/06/2020   Hypertension 04/06/2020   Tobacco use disorder 04/06/2020    ONSET DATE: 01/31/23  new CVA  REFERRING DIAG: M84.132 (ICD-10-CM) - Hemiparesis affecting left side as late effect of stroke (HCC)  THERAPY DIAG:  Muscle weakness (generalized)  Other abnormalities of gait and mobility  Other lack of coordination  Rationale for Evaluation and Treatment: Rehabilitation  SUBJECTIVE:                                                                                                                                                                                             SUBJECTIVE STATEMENT: Pt denies any falls. No new complaints.  Reports she's been doing her HEP.  Pt accompanied by: self  PERTINENT HISTORY: presented to the [facility] ED on 01/31/2023 complaining of new onset of left sided weakness and slurred speech. Symptoms started on 6/05. She is maintained on Plavix due to prior stroke. PMH significant for hypertension, tobacco use and DM-2.  PAIN:  Are you having pain? No  PRECAUTIONS: Fall  RED FLAGS: None   WEIGHT BEARING RESTRICTIONS: No  FALLS: Has patient fallen in last 6 months? Yes. Number of falls 1 -- when she had a CVA  LIVING ENVIRONMENT: Lives with:  mom Lives in: House/apartment Stairs: No Has following equipment at home: None  PLOF: Independent  PATIENT GOALS: Improving L side mobility and use  OBJECTIVE:  Note: Objective measures were completed at Evaluation unless otherwise noted.  DIAGNOSTIC FINDINGS: 02/01/23 Brain MRI IMPRESSION: 1. 9 mm acute ischemic nonhemorrhagic infarct involving the posterior right frontal centrum semi ovale. 2. Underlying moderate to advanced chronic microvascular ischemic disease with multiple remote lacunar infarcts involving the bilateral basal ganglia, thalami, pons, and cerebellum. 3. Multiple scattered chronic micro hemorrhages as above, she most characteristic of chronic poorly controlled hypertension.  COORDINATION:  Unable to do heel to shin on L due to weakness  POSTURE: increased lumbar  lordosis  UPPER EXTREMITY MMT: TBA  MMT Right eval Left eval  Shoulder flexion    Shoulder extension    Shoulder abduction    Shoulder adduction    Shoulder extension    Shoulder internal rotation    Shoulder external rotation    Middle trapezius    Lower trapezius    Elbow flexion    Elbow extension    Wrist flexion    Wrist extension    Wrist ulnar deviation    Wrist radial deviation    Wrist pronation    Wrist supination    Grip strength     (Blank rows = not tested)   LOWER EXTREMITY MMT:    MMT Right Eval Left Eval  Hip flexion 4 2  Hip extension 3- 1  Hip abduction 3- 2-  Hip adduction    Hip internal rotation    Hip external rotation    Knee flexion 5 4  Knee extension 5 4-  Ankle dorsiflexion    Ankle plantarflexion    Ankle inversion    Ankle eversion    (Blank rows = not tested)  BED MOBILITY:  Slow and guarded with rolling L<>R and sup<>sit. Increased time required for trunk negotiation with sup to sit Difficulty rolling R side to prone but able to roll L side to prone  GAIT: Gait pattern:  Step through pattern, decreased L LE stance time with mild ER, hip drop with L stance phase Distance walked: x100' Assistive device utilized: None Level of assistance: Complete Independence  FUNCTIONAL TESTS:  5 times sit to stand: 23.13 sec Berg Balance Scale: 35/56 TUG 22.5 sec  PATIENT SURVEYS:  The Patient-Specific Functional Scale  Initial:  I am going to ask you to identify up to 3 important activities that you are unable to do or are having difficulty with as a result of this problem.  Today are there any activities that you are unable to do or having difficulty with because of this?  (Patient shown scale and patient rated each activity)  Follow up: When you first came in you had difficulty performing these activities.  Today do you still have difficulty?  Patient-Specific activity scoring scheme (Point to one  number):  0 1 2 3 4 5 6 7 8 9  10 Unable                                                                                                          Able to perform To perform                                                                                                    activity at the same Activity         Level as before                                                                                                                       Injury or problem Activity Initial (eval): Follow up:  Getting in /out of bed 5   2.   Getting in/out of shower    5   3.   Doing dishes 5   Total 15/3 = 5        TODAY'S TREATMENT:                                                                                                                              DATE:  08/07/23 Nustep L5 2x 5 min UEs/LEs maintaining > 50 SPM Sit<>stand 10# KB 2x10 Bridge and scoots x10 Sup->sidelying->sit x10 R&L Attempted ambulating on treadmill but pt stopped midway stating it's too hard Ambulating around gym with [facility] with attentional walking for: Fast/slow walking Marching Big steps Backwards walking 2x10' Side stepping 2x10' Pt reports she was "done" after amb    PATIENT EDUCATION: Education  details:  Scheduling ST eval at end of today's session.  Using SPC. Person educated: Patient Education method: Explanation, Demonstration, and Handouts Education comprehension: verbalized understanding, returned demonstration, and needs further education  HOME EXERCISE PROGRAM: Access Code: [email or web address] URL: [email or web address] Date: 07/17/2023 Prepared by: Vernon Prey April Kirstie Peri  Exercises - Supine Bridge  - 1 x daily - 7 x weekly - 2 sets - 10 reps - Supine Active Straight Leg Raise  - 1 x daily - 7 x weekly - 2 sets - 10 reps - Quadruped Alternating Leg Extensions  - 1 x daily - 7 x weekly - 2 sets - 10 reps - Quadruped Fire Hydrant  - 1 x daily - 7 x weekly - 2 sets - 10 reps - Sit to Stand  with Armchair  - 1 x daily - 7 x weekly - 3 sets - 10 reps - Standing Single Leg Stance with Counter Support  - 1 x daily - 7 x weekly - 1 sets - 3 reps - 45 seconds hold - Forward Reach Using Hip Strategy Forward Backward  - 1 x daily - 7 x weekly - 3 sets - 10 reps  GOALS: Goals reviewed with patient? Yes  SHORT TERM GOALS: Target date: 08/14/2023   Pt will be ind with initial HEP Baseline: Goal status: INITIAL  2.  Pt will have improved 5x STS to </=20 sec Baseline:  Goal status: INITIAL  3.  Pt will have improved TUG to </=18 sec Baseline:  Goal status: INITIAL    LONG TERM GOALS: Target date: 09/11/2023   Pt will be ind with management and progression of HEP Baseline:  Goal status: INITIAL  2.  Pt will have improved PSFS average to >/=7 to demo MCID Baseline:  Goal status: INITIAL  3.  Pt will have improved Berg Balance Score to >/=43/56 to demo MCID Baseline:  Goal status: INITIAL  4.  Pt will have improved TUG to </=15 sec to demo decreased fall risk Baseline:  Goal status: INITIAL  5.  Pt will have improved 5x STS to </=13 sec to demo low fall risk Baseline:  Goal status: INITIAL    ASSESSMENT:  CLINICAL IMPRESSION: Continued LE strengthening. Worked on transfers and bed mobility as this was one of the tasks pt reported difficulty at home. Worked on improving gait through dynamic balance tasks, directional stepping and varying speed.   From eval: Patient is a 52 y.o. F who was seen today for physical therapy evaluation and treatment for CVA with L side weakness. Pt notes she had a new CVA last month but didn't go to the hospital. She states she saw her PCP instead and has noted decreased L side strength. Assessment significant for L LE and UE weakness, decreased balance, and decreased endurance affecting functional mobility, t/fs, and amb. Pt will benefit from PT to address these deficits for improved function at home and decreased fall risk.   OBJECTIVE  IMPAIRMENTS: Abnormal gait, decreased activity tolerance, decreased balance, decreased coordination, decreased endurance, decreased mobility, difficulty walking, decreased ROM, decreased strength, impaired UE functional use, improper body mechanics, and postural dysfunction.    PLAN:  PT FREQUENCY: 1-2x/week  PT DURATION: 8 weeks  PLANNED INTERVENTIONS: 97164- PT Re-evaluation, 97110-Therapeutic exercises, 97530- Therapeutic activity, [id number]- Neuromuscular re-education, 97535- Self Care, 53664- Manual therapy, [id number]- Gait training, 97014- Electrical stimulation (unattended), [phone]- Ionotophoresis 4mg /ml Dexamethasone, Patient/Family education, Balance training, Stair training, Taping, Vestibular training, Cryotherapy, and Moist heat  PLAN FOR NEXT SESSION: CHECK STGS!  Review and update HEP as able. Work on L LE strengthening and stability/balance. Work on transfers.  Stepping strategy.  Continue reviewing SPC sequencing/use.   Erdine Hulen April Ma L Shantanique Hodo, PT, DPT 08/07/2023, 8:48 AM

## 2023-08-12 ENCOUNTER — Ambulatory Visit: Payer: 59 | Admitting: Physical Therapy

## 2023-08-13 NOTE — Therapy (Deleted)
OUTPATIENT SPEECH LANGUAGE PATHOLOGY EVALUATION   Patient Name: Roberta Calderon MRN: [id number] DOB:August 09, 1971, 52 y.o., female Today's Date: 08/13/2023  PCP: Grayce Sessions, NP REFERRING PROVIDER: Grayce Sessions, NP  END OF SESSION:   Past Medical History:  Diagnosis Date   Cocaine abuse (HCC) 08/14/2021   Hypertension    Non-insulin dependent type 2 diabetes mellitus (HCC) 02/01/2023   Stroke (cerebrum) (HCC) 04/10/2022   No past surgical history on file. Patient Active Problem List   Diagnosis Date Noted   Small vessel cerebrovascular accident (CVA) (HCC) 02/04/2023   Non-insulin dependent type 2 diabetes mellitus (HCC) 02/01/2023   Stroke (cerebrum) (HCC) 04/10/2022   Acute CVA (cerebrovascular accident) (HCC) 02/05/2022   Cerebrovascular accident (CVA) of right basal ganglia (HCC) 08/14/2021   Hypertensive urgency 08/14/2021   Cocaine abuse (HCC) 08/14/2021   Metabolic syndrome 08/14/2021   Right sided weakness 08/14/2021   Alcohol abuse 08/14/2021   Prolonged QT interval 08/14/2021   ICH (intracerebral hemorrhage) (HCC) 02/07/2021   Dyslipidemia    Right thalamic infarction (HCC) 04/08/2020   Prediabetes    Polysubstance abuse (HCC)    Hemiparesis affecting left side as late effect of stroke (HCC)    Ischemic stroke (HCC) 04/06/2020   Hypertension 04/06/2020   Tobacco use disorder 04/06/2020    ONSET DATE: 08/01/23 (referral date);  01/31/23 new CVA   REFERRING DIAG:  I63.9 (ICD-10-CM) - Acute CVA (cerebrovascular accident) (HCC) I63.81 (ICD-10-CM) - Cerebrovascular accident (CVA) of right basal ganglia  THERAPY DIAG:  No diagnosis found.  Rationale for Evaluation and Treatment: Rehabilitation  SUBJECTIVE:   SUBJECTIVE STATEMENT: *** Pt accompanied by: {accompnied:27141}  PERTINENT HISTORY: presented to the [facility] ED on 01/31/2023 complaining of new onset of left sided weakness and slurred speech. Symptoms started on 6/05. She is maintained  on Plavix due to prior stroke. PMH significant for hypertension, tobacco use and DM-2.   PAIN:  Are you having pain? {OPRCPAIN:27236}  FALLS: Has patient fallen in last 6 months?  {WJXBJYNW:29562}  LIVING ENVIRONMENT: Lives with: {OPRC lives with:25569::"lives with their family"} Lives in: {Lives in:25570}  PLOF:  Level of assistance: {ZHYQMVH:84696} Employment: {SLPemployment:25674}  PATIENT GOALS: ***  OBJECTIVE:  Note: Objective measures were completed at Evaluation unless otherwise noted.  DIAGNOSTIC FINDINGS: 02/01/23 Brain MRI IMPRESSION: 1. 9 mm acute ischemic nonhemorrhagic infarct involving the posterior right frontal centrum semi ovale. 2. Underlying moderate to advanced chronic microvascular ischemic disease with multiple remote lacunar infarcts involving the bilateral basal ganglia, thalami, pons, and cerebellum. 3. Multiple scattered chronic micro hemorrhages as above, she most characteristic of chronic poorly controlled hypertension.  COGNITION: Overall cognitive status: {cognition:24006} Areas of impairment:  {cognitiveimpairmentslp:27409} Functional deficits: ***  MOTOR SPEECH: assessed across variety of speech tasks: reading, word repetition, generative discourse sample Overall motor speech: {slpimpaired:27210} Level of impairment: {SLP level of impairment:25441} Rate of Speech: {slprateofspeech:28668} Dysfluencies: {SLP dysfluncies:28669} Phonation: {SLP phonation:25439} Oral reading loudness average: *** dB Conversational loudness average: *** dB Voice Quality: {VQL:27192} Respiration: {respbreathing:27195} Word and Phrasal Stress: {SLP stress:28670} Resonance: {SLP resonance:25440} Articulation: {SLParticulation:27218} Diadochokinetic Rate (DDK): {SLP EXB:28413} Intelligibility: {SLP Intelligible:25442} Motor planning: {slpmotorspeecherrors:27220} Interfering components: {SLP Interfering components (MS):25444} Effective technique: {SLP effective  technique (MS):25445}    ORAL MOTOR EXAMINATION: Overall status: {OMESLP2:27645} Comments: ***  STANDARDIZED ASSESSMENTS: {SLPstandardizedassessment:27092}  PATIENT REPORTED OUTCOME MEASURES (PROM): {SLPPROM:27095}   TODAY'S TREATMENT:  DATE: ***   PATIENT EDUCATION: Education details: *** Person educated: {Person educated:25204} Education method: {Education Method:25205} Education comprehension: {Education Comprehension:25206}   GOALS: Goals reviewed with patient? Yes  SHORT TERM GOALS: Target date: 09/11/2023  *** Baseline: Goal status: INITIAL  2.  *** Baseline:  Goal status: INITIAL  3.  *** Baseline:  Goal status: INITIAL  4.  *** Baseline:  Goal status: INITIAL  5.  *** Baseline:  Goal status: INITIAL  6.  *** Baseline:  Goal status: INITIAL  LONG TERM GOALS: Target date: ***  *** Baseline:  Goal status: INITIAL  2.  *** Baseline:  Goal status: INITIAL  3.  *** Baseline:  Goal status: INITIAL  4.  *** Baseline:  Goal status: INITIAL  5.  *** Baseline:  Goal status: INITIAL  6.  *** Baseline:  Goal status: INITIAL  ASSESSMENT:  CLINICAL IMPRESSION: Patient is a 52 y.o. F who was seen today for dysarthria s/p stroke in June 2024.   OBJECTIVE IMPAIRMENTS: include dysarthria. These impairments are limiting patient from effectively communicating at home and in community. Factors affecting potential to achieve goals and functional outcome are {SLP factors:25450}. Patient will benefit from skilled SLP services to address above impairments and improve overall function.  REHAB POTENTIAL: Good  PLAN:  SLP FREQUENCY: 1x/week  SLP DURATION: {rehab duration:25117}  PLANNED INTERVENTIONS: {SLP treatment/interventions:25449}    Gracy Racer, CCC-SLP 08/13/2023, 3:32 PM

## 2023-08-14 ENCOUNTER — Ambulatory Visit: Payer: 59

## 2023-08-14 ENCOUNTER — Ambulatory Visit: Payer: 59 | Admitting: Physical Therapy

## 2023-08-14 ENCOUNTER — Other Ambulatory Visit (INDEPENDENT_AMBULATORY_CARE_PROVIDER_SITE_OTHER): Payer: Self-pay | Admitting: Primary Care

## 2023-08-14 NOTE — Telephone Encounter (Signed)
Medication Refill -  Most Recent Primary Care Visit:  Provider: Grayce Sessions  Department: [facility]  Visit Type: OFFICE VISIT  Date: 07/10/2023  Medication: atorvastatin (LIPITOR) 80 MG tablet [[id number]]   Has the patient contacted their pharmacy? No (Is this the correct pharmacy for this prescription? Yes If no, delete pharmacy and type the correct one.  This is the patient's preferred pharmacy:  [facility] [address] ([address] [place] - [address]) [place] [address] Phone: [phone] Fax: [phone]    Has the prescription been filled recently? Yes  Is the patient out of the medication? Yes  Has the patient been seen for an appointment in the last year OR does the patient have an upcoming appointment? Yes  Can we respond through MyChart? No  Agent: Please be advised that Rx refills may take up to 3 business days. We ask that you follow-up with your pharmacy.

## 2023-08-14 NOTE — Telephone Encounter (Signed)
Rx 06/25/23 #90- too soon Requested Prescriptions  Pending Prescriptions Disp Refills   atorvastatin (LIPITOR) 80 MG tablet 90 tablet 0    Sig: Take 1 tablet (80 mg total) by mouth daily.     Cardiovascular:  Antilipid - Statins Failed - 08/14/2023  1:28 PM      Failed - Lipid Panel in normal range within the last 12 months    Cholesterol, Total  Date Value Ref Range Status  07/30/2022 208 (H) 100 - 199 mg/dL Final   Cholesterol  Date Value Ref Range Status  02/01/2023 220 (H) 0 - 200 mg/dL Final   LDL Chol Calc (NIH)  Date Value Ref Range Status  07/30/2022 133 (H) 0 - 99 mg/dL Final   LDL Cholesterol  Date Value Ref Range Status  02/01/2023 153 (H) 0 - 99 mg/dL Final    Comment:           Total Cholesterol/HDL:CHD Risk Coronary Heart Disease Risk Table                     Men   Women  1/2 Average Risk   3.4   3.3  Average Risk       5.0   4.4  2 X Average Risk   9.6   7.1  3 X Average Risk  23.4   11.0        Use the calculated Patient Ratio above and the CHD Risk Table to determine the patient's CHD Risk.        ATP III CLASSIFICATION (LDL):  <100     mg/dL   Optimal  324-401  mg/dL   Near or Above                    Optimal  130-159  mg/dL   Borderline  027-253  mg/dL   High  >664     mg/dL   Very High Performed at [facility], [address], [place], [place] [address]    HDL  Date Value Ref Range Status  02/01/2023 49 >40 mg/dL Final  42/59/5638 57 >75 mg/dL Final   Triglycerides  Date Value Ref Range Status  02/01/2023 90 <150 mg/dL Final         Passed - Patient is not pregnant      Passed - Valid encounter within last 12 months    Recent Outpatient Visits           1 month ago Hemiparesis affecting left side as late effect of stroke (HCC)   [place] [facility] Grayce Sessions, NP   1 month ago Acute CVA (cerebrovascular accident) [facility])   [place] [facility] Grayce Sessions, NP   5  months ago Hospital discharge follow-up   [place] [facility] Grayce Sessions, NP   7 months ago Depression with anxiety   [place] [facility] Grayce Sessions, NP   1 year ago Cervical cancer screening   [place] [facility] Grayce Sessions, NP

## 2023-08-19 ENCOUNTER — Telehealth: Payer: Self-pay

## 2023-08-19 ENCOUNTER — Ambulatory Visit: Payer: 59 | Admitting: Physical Therapy

## 2023-08-19 DIAGNOSIS — F1920 Other psychoactive substance dependence, uncomplicated: Secondary | ICD-10-CM | POA: Diagnosis not present

## 2023-08-19 NOTE — Telephone Encounter (Signed)
Patient Name: Roberta Calderon MRN: [id number] DOB:July 18, 1971, 52 y.o., female Today's Date: 08/19/2023  Patient was called today and left VM on her mobile number. Today was patient's 3rd consecutive No show. Pt was reminded of following.  Patient has had following no shows. 12/16- PT follow up 12/18- PT follow up and SLP eval 12/23- PT follow up.  Due to poor compliance and our department policy patient will be allowed to schedule one appointment at a time. Patient was reminded of her next PT follow up appointment on 08/26/23 at 8:45am. If patient doesn't attend her next follow up appointment, she may be automatically discharged from PT per primary PT discretion.    Ileana Ladd, PT 08/19/2023, 11:23 AM

## 2023-08-26 ENCOUNTER — Ambulatory Visit: Payer: 59 | Admitting: Physical Therapy

## 2023-08-26 ENCOUNTER — Encounter: Payer: Self-pay | Admitting: Physical Therapy

## 2023-08-26 DIAGNOSIS — F1920 Other psychoactive substance dependence, uncomplicated: Secondary | ICD-10-CM | POA: Diagnosis not present

## 2023-08-26 NOTE — Therapy (Signed)
[facility] [address] [place], [place], [address] Phone: [phone]   Fax:  [phone]  Patient Details  Name: LEWIS GLERUM MRN: [id number] Date of Birth: 1971-05-18 Referring Provider:  No ref. provider found  Encounter Date: 08/26/2023  PHYSICAL THERAPY DISCHARGE SUMMARY  Visits from Start of Care: 6  Current functional level related to goals / functional outcomes: N/A   Remaining deficits: See previous treatment note (08/07/23)   Education / Equipment: HEP   Patient agrees to discharge. Patient goals were not met. Patient is being discharged due to not returning since the last visit.   Jill Alexanders Marquize Seib, PT 08/26/2023, 9:07 AM  [facility] [address] [place], [place], [address] Phone: [phone]   Fax:  [phone]

## 2023-09-02 ENCOUNTER — Ambulatory Visit: Payer: MEDICAID | Admitting: Physical Therapy

## 2023-09-04 ENCOUNTER — Ambulatory Visit: Payer: MEDICAID | Admitting: Physical Therapy

## 2023-09-04 DIAGNOSIS — F1920 Other psychoactive substance dependence, uncomplicated: Secondary | ICD-10-CM | POA: Diagnosis not present

## 2023-09-09 ENCOUNTER — Ambulatory Visit: Payer: MEDICAID | Admitting: Physical Therapy

## 2023-09-11 ENCOUNTER — Ambulatory Visit: Payer: MEDICAID | Admitting: Physical Therapy

## 2023-09-11 DIAGNOSIS — F1920 Other psychoactive substance dependence, uncomplicated: Secondary | ICD-10-CM | POA: Diagnosis not present

## 2023-09-16 ENCOUNTER — Ambulatory Visit: Payer: MEDICAID | Admitting: Physical Therapy

## 2023-09-18 ENCOUNTER — Ambulatory Visit: Payer: MEDICAID | Admitting: Physical Therapy

## 2023-09-19 ENCOUNTER — Encounter: Payer: Self-pay | Admitting: Internal Medicine

## 2023-10-02 ENCOUNTER — Telehealth: Payer: Self-pay

## 2023-10-02 NOTE — Telephone Encounter (Signed)
Patient has upcoming colonoscopy on 11/08/23. Patient needs cardiac clearance for holding Plavix prior to procedure. RN will communicate with Dr. Derek Mound RN to complete this.

## 2023-10-02 NOTE — Telephone Encounter (Addendum)
 Pre-operative Risk Assessment     Request for surgical clearance:     Endoscopy Procedure  What type of surgery is being performed?     colonoscopy  When is this surgery scheduled?     11/08/23  What type of clearance is required ?   Pharmacy  Are there any medications that need to be held prior to surgery and how long? Plavix  needs to be held 5-7 days prior to procedure date  Practice name and name of physician performing surgery?      [facility] with Dr Federico  What is your office phone and fax number?      Phone- [phone]  Fax- [phone]  Anesthesia type (None, local, MAC, general) ?       MAC   Please route your response to Roberta Goo, LPN  Thank you   Patient was seen by vascular neurologist Dr. Rosemarie at the hospital in June 2024 for 9 mm acute ischemic infarction involving the posterior right frontal centrum semiovale, was discharged with Plavix  75 mg daily  She was not followed up at our clinic since hospital, I see no contraindication with information stated above based on the evaluation from June 2024  Roberta Calderon, M.D. Ph.D.  [facility] [address] [place], [place] [address] Phone: [phone] Fax:      [phone]

## 2023-10-10 NOTE — Telephone Encounter (Signed)
Spoke with the patient. She states she is no longer on Plavix. She does not take any medication for anti-coagulation.

## 2023-10-11 ENCOUNTER — Telehealth: Payer: Self-pay

## 2023-10-11 ENCOUNTER — Ambulatory Visit (AMBULATORY_SURGERY_CENTER): Payer: 59

## 2023-10-11 VITALS — Ht 62.0 in | Wt 139.0 lb

## 2023-10-11 DIAGNOSIS — Z1211 Encounter for screening for malignant neoplasm of colon: Secondary | ICD-10-CM

## 2023-10-11 MED ORDER — SUFLAVE 178.7 G PO SOLR: 1.0000 | Freq: Once | ORAL | 0 refills | Status: AC

## 2023-10-11 NOTE — Telephone Encounter (Signed)
Reached patient and completed Pre visit

## 2023-10-11 NOTE — Progress Notes (Signed)

## 2023-10-11 NOTE — Telephone Encounter (Signed)
Called at 2:45 and 2:58 to try and reach patient for PV.  No answer .  Left message that I was trying to reach her.

## 2023-10-11 NOTE — Telephone Encounter (Signed)
Patient called back for 2:30 PM pre op appointment. Patient was advised she would have to reschedule but she disconnected phone call.

## 2023-10-11 NOTE — Telephone Encounter (Signed)
Called alternate Home number trying to reach the patient.  No answer.  Left message to call back and reschedule pre visit before 5 or the colonoscopy on 11/08/23 would be cancelled.

## 2023-10-18 ENCOUNTER — Telehealth (INDEPENDENT_AMBULATORY_CARE_PROVIDER_SITE_OTHER): Payer: Self-pay

## 2023-10-18 ENCOUNTER — Other Ambulatory Visit (INDEPENDENT_AMBULATORY_CARE_PROVIDER_SITE_OTHER): Payer: Self-pay | Admitting: Primary Care

## 2023-10-18 ENCOUNTER — Telehealth: Payer: Self-pay

## 2023-10-18 DIAGNOSIS — I639 Cerebral infarction, unspecified: Secondary | ICD-10-CM

## 2023-10-18 MED ORDER — LOSARTAN POTASSIUM 50 MG PO TABS: 50.0000 mg | ORAL_TABLET | Freq: Every day | ORAL | 0 refills | Status: DC

## 2023-10-18 NOTE — Telephone Encounter (Signed)
 Routing to office

## 2023-10-18 NOTE — Telephone Encounter (Signed)
Copied from CRM [id number]. Topic: Clinical - Medication Question >> Oct 18, 2023 11:39 AM Geroge Baseman wrote: Reason for CRM: Patient states she needs medication refilled for blood pressure. Does not know what it is called. Tried to name some off on her list to find out and she was very rude with me yelling she does not know, over and over. "Just tell michelle I need my medicine." I asked her if she happened to still have the bottle with the name on it, she began to curse at me again stating "just tell michelle that is all I asked you to do" and then hung up on me.

## 2023-10-18 NOTE — Telephone Encounter (Signed)
This is a duplicate. Already received message and forward to provider

## 2023-10-28 ENCOUNTER — Telehealth: Payer: Self-pay

## 2023-10-28 DIAGNOSIS — G4700 Insomnia, unspecified: Secondary | ICD-10-CM

## 2023-10-28 DIAGNOSIS — F418 Other specified anxiety disorders: Secondary | ICD-10-CM

## 2023-10-28 NOTE — Telephone Encounter (Signed)
 Routing to office

## 2023-10-28 NOTE — Telephone Encounter (Signed)
 Copied from CRM [id number]. Topic: Clinical - Medication Question >> Oct 23, 2023  4:35 PM Fuller Mandril wrote: Reason for CRM: Patient called, requested callback from Gwinda Passe as soon as possible. Did not provide much information just that she has questions about her upcoming coloscopy and her medication. Thank You

## 2023-10-29 MED ORDER — ATORVASTATIN CALCIUM 80 MG PO TABS: 80.0000 mg | ORAL_TABLET | Freq: Every day | ORAL | 0 refills | Status: DC

## 2023-10-29 NOTE — Telephone Encounter (Signed)
 Spoke with patient. Questions she has 1. Would like to stay overnight in the hospital for her colonoscopy. Advised patient that this is an outpatient procedure, typically not done in the hospital. Patient verbalized understanding.   2. Requesting medication refills on Losartan, atorvastatin, and trazodone. She states she is out of her medication. Verified pharmacy. It is the same in the chart. Patient  was encouraged to call pharmacy for the lorsartan. Had quantity of 90 sent on 09/2023.  Advised will send her atorvastatin and would send the trazodone to her PCP for review.    Patient verbalized understanding.

## 2023-10-29 NOTE — Addendum Note (Signed)
 Addended by: Guy Franco on: 10/29/2023 02:47 PM   Modules accepted: Orders

## 2023-10-31 ENCOUNTER — Other Ambulatory Visit (INDEPENDENT_AMBULATORY_CARE_PROVIDER_SITE_OTHER): Payer: Self-pay | Admitting: Primary Care

## 2023-10-31 DIAGNOSIS — I639 Cerebral infarction, unspecified: Secondary | ICD-10-CM

## 2023-10-31 NOTE — Telephone Encounter (Signed)
 Copied from CRM [id number]. Topic: Clinical - Medication Refill >> Oct 31, 2023  5:14 PM Shelah Lewandowsky wrote: Most Recent Primary Care Visit:  Provider: Grayce Sessions  Department[facility]  Visit Type: OFFICE VISIT  Date: 07/10/2023  Medication: losartan (COZAAR) 50 MG tablet  Has the patient contacted their pharmacy? Yes (Agent: If no, request that the patient contact the pharmacy for the refill. If patient does not wish to contact the pharmacy document the reason why and proceed with request.) (Agent: If yes, when and what did the pharmacy advise?)  Is this the correct pharmacy for this prescription? Yes If no, delete pharmacy and type the correct one.  This is the patient's preferred pharmacy:  [facility] [address] ([address] [place] - [address] [place] ([place]) [place] [address] Phone: [phone] Fax: [phone]    Has the prescription been filled recently? Yes  Is the patient out of the medication? Yes  Has the patient been seen for an appointment in the last year OR does the patient have an upcoming appointment? Yes  Can we respond through MyChart? No  Agent: Please be advised that Rx refills may take up to 3 business days. We ask that you follow-up with your pharmacy.

## 2023-11-01 NOTE — Telephone Encounter (Signed)
 Duplicate request,lasted refilled 10/18/23 for 90 days.  Requested Prescriptions  Pending Prescriptions Disp Refills   losartan (COZAAR) 50 MG tablet 90 tablet 0    Sig: Take 1 tablet (50 mg total) by mouth once daily.     Cardiovascular:  Angiotensin Receptor Blockers Failed - 11/01/2023 11:39 AM      Failed - Cr in normal range and within 180 days    Creatinine, Ser  Date Value Ref Range Status  03/13/2023 1.44 (H) 0.44 - 1.00 mg/dL Final         Failed - K in normal range and within 180 days    Potassium  Date Value Ref Range Status  03/13/2023 3.2 (L) 3.5 - 5.1 mmol/L Final         Passed - Patient is not pregnant      Passed - Last BP in normal range    BP Readings from Last 1 Encounters:  08/05/23 137/78         Passed - Valid encounter within last 6 months    Recent Outpatient Visits           3 months ago Hemiparesis affecting left side as late effect of stroke (HCC)   [place] [facility] Grayce Sessions, NP   4 months ago Acute CVA (cerebrovascular accident) [facility])   [place] [facility] Grayce Sessions, NP   8 months ago Hospital discharge follow-up   [place] [facility] Grayce Sessions, NP   9 months ago Depression with anxiety   [place] [facility] Grayce Sessions, NP   1 year ago Cervical cancer screening    [facility] Grayce Sessions, NP

## 2023-11-06 ENCOUNTER — Other Ambulatory Visit (INDEPENDENT_AMBULATORY_CARE_PROVIDER_SITE_OTHER): Payer: Self-pay | Admitting: Primary Care

## 2023-11-06 DIAGNOSIS — I639 Cerebral infarction, unspecified: Secondary | ICD-10-CM

## 2023-11-06 NOTE — Telephone Encounter (Signed)
 Copied from CRM [id number]. Topic: Clinical - Medication Refill >> Nov 06, 2023 12:04 PM Antwanette L wrote: Most Recent Primary Care Visit:  Provider: Grayce Sessions  Department[facility]  Visit Type: OFFICE VISIT  Date: 07/10/2023  Medication: losartan (COZAAR) 50 MG tablet  Has the patient contacted their pharmacy? No   Is this the correct pharmacy for this prescription? Yes   This is the patient's preferred pharmacy:  [facility] [address] ([address]), [place] - [address] [place] ([place]) [place] [address] Phone: [phone] Fax: [phone]    Has the prescription been filled recently? No  Is the patient out of the medication? Yes  Has the patient been seen for an appointment in the last year OR does the patient have an upcoming appointment? Yes  Can we respond through MyChart? No. Patient can be contacted by phone at [phone]  Agent: Please be advised that Rx refills may take up to 3 business days. We ask that you follow-up with your pharmacy.

## 2023-11-07 NOTE — Telephone Encounter (Signed)
 Refilled 10/09/23 # 90. Requested Prescriptions  Refused Prescriptions Disp Refills   losartan (COZAAR) 50 MG tablet 90 tablet 0    Sig: Take 1 tablet (50 mg total) by mouth once daily.     Cardiovascular:  Angiotensin Receptor Blockers Failed - 11/07/2023  8:21 AM      Failed - Cr in normal range and within 180 days    Creatinine, Ser  Date Value Ref Range Status  03/13/2023 1.44 (H) 0.44 - 1.00 mg/dL Final         Failed - K in normal range and within 180 days    Potassium  Date Value Ref Range Status  03/13/2023 3.2 (L) 3.5 - 5.1 mmol/L Final         Passed - Patient is not pregnant      Passed - Last BP in normal range    BP Readings from Last 1 Encounters:  08/05/23 137/78         Passed - Valid encounter within last 6 months    Recent Outpatient Visits           4 months ago Hemiparesis affecting left side as late effect of stroke (HCC)   [place] [facility] Grayce Sessions, NP   4 months ago Acute CVA (cerebrovascular accident) [facility])   [place] [facility] Grayce Sessions, NP   8 months ago Hospital discharge follow-up   [place] [facility] Grayce Sessions, NP   9 months ago Depression with anxiety   [place] [facility] Grayce Sessions, NP   1 year ago Cervical cancer screening   [place] [facility] Grayce Sessions, NP

## 2023-11-08 ENCOUNTER — Encounter: Payer: 59 | Admitting: Internal Medicine

## 2023-11-10 MED ORDER — TRAZODONE HCL 50 MG PO TABS: 25.0000 mg | ORAL_TABLET | Freq: Every day | ORAL | 0 refills | Status: AC

## 2023-11-25 ENCOUNTER — Other Ambulatory Visit (INDEPENDENT_AMBULATORY_CARE_PROVIDER_SITE_OTHER): Payer: Self-pay | Admitting: Primary Care

## 2023-11-25 DIAGNOSIS — I639 Cerebral infarction, unspecified: Secondary | ICD-10-CM

## 2023-11-25 NOTE — Telephone Encounter (Signed)
 Copied from CRM [id number]. Topic: Clinical - Medication Refill >> Nov 25, 2023  1:38 PM Macon Large wrote: Most Recent Primary Care Visit:  Provider: Grayce Sessions  [facility]  Visit Type: OFFICE VISIT  Date: 07/10/2023  Medication: losartan (COZAAR) 50 MG tablet  Has the patient contacted their pharmacy? Yes  Is this the correct pharmacy for this prescription? Yes If no, delete pharmacy and type the correct one.  This is the patient's preferred pharmacy:  [facility] [address] ([address]), [place] - [address] [place] ([place]) [place] [address] Phone: [phone] Fax: [phone]  Has the prescription been filled recently? No  Is the patient out of the medication? Yes  Has the patient been seen for an appointment in the last year OR does the patient have an upcoming appointment? Yes  Can we respond through MyChart? No  Agent: Please be advised that Rx refills may take up to 3 business days. We ask that you follow-up with your pharmacy.

## 2024-01-07 ENCOUNTER — Other Ambulatory Visit (INDEPENDENT_AMBULATORY_CARE_PROVIDER_SITE_OTHER): Payer: Self-pay | Admitting: Primary Care

## 2024-01-07 DIAGNOSIS — I639 Cerebral infarction, unspecified: Secondary | ICD-10-CM

## 2024-01-07 NOTE — Telephone Encounter (Signed)
 Copied from CRM [id number]. Topic: Clinical - Medication Question >> Jan 07, 2024  1:58 PM Emylou G wrote: Reason for CRM: Patient called.. Pls refill BP meds.. she doesn't know the name of it?  Her number on file is good if you need her.. [facility] is her preferred pharm

## 2024-01-09 NOTE — Telephone Encounter (Signed)
 Unable to refill per protocol, courtesy refill already given, OV needed for additional refills.  Requested Prescriptions  Pending Prescriptions Disp Refills   losartan  (COZAAR ) 50 MG tablet 90 tablet 0    Sig: Take 1 tablet (50 mg total) by mouth once daily.     Cardiovascular:  Angiotensin Receptor Blockers Failed - 01/09/2024 11:09 AM      Failed - Cr in normal range and within 180 days    Creatinine, Ser  Date Value Ref Range Status  03/13/2023 1.44 (H) 0.44 - 1.00 mg/dL Final         Failed - K in normal range and within 180 days    Potassium  Date Value Ref Range Status  03/13/2023 3.2 (L) 3.5 - 5.1 mmol/L Final         Failed - Valid encounter within last 6 months    Recent Outpatient Visits           6 months ago Hemiparesis affecting left side as late effect of stroke (HCC)   [place] [facility] Marius Siemens, NP   6 months ago Acute CVA (cerebrovascular accident) [facility])   [place] [facility] Marius Siemens, NP   10 months ago Hospital discharge follow-up   [place] [facility] Marius Siemens, NP   11 months ago Depression with anxiety   [place] [facility] Marius Siemens, NP   1 year ago Cervical cancer screening   [place] [facility] Marius Siemens, NP              Passed - Patient is not pregnant      Passed - Last BP in normal range    BP Readings from Last 1 Encounters:  08/05/23 137/78

## 2024-01-22 ENCOUNTER — Telehealth (INDEPENDENT_AMBULATORY_CARE_PROVIDER_SITE_OTHER): Payer: Self-pay | Admitting: Primary Care

## 2024-01-22 NOTE — Telephone Encounter (Signed)
 Left VM with pt about their upcoming appt.

## 2024-01-23 ENCOUNTER — Ambulatory Visit (INDEPENDENT_AMBULATORY_CARE_PROVIDER_SITE_OTHER): Admitting: Primary Care

## 2024-01-24 ENCOUNTER — Other Ambulatory Visit (INDEPENDENT_AMBULATORY_CARE_PROVIDER_SITE_OTHER): Payer: Self-pay | Admitting: Primary Care

## 2024-01-24 DIAGNOSIS — I10 Essential (primary) hypertension: Secondary | ICD-10-CM

## 2024-01-24 DIAGNOSIS — I639 Cerebral infarction, unspecified: Secondary | ICD-10-CM

## 2024-01-24 DIAGNOSIS — E785 Hyperlipidemia, unspecified: Secondary | ICD-10-CM

## 2024-01-24 DIAGNOSIS — E119 Type 2 diabetes mellitus without complications: Secondary | ICD-10-CM

## 2024-01-24 NOTE — Telephone Encounter (Signed)
 Copied from CRM [id number]. Topic: Clinical - Medication Refill >> Jan 24, 2024  2:44 PM Yolanda T wrote: Medication: atorvastatin  (LIPITOR ) 80 MG tablet and losartan  (COZAAR ) 50 MG tablet  Has the patient contacted their pharmacy? Yes  This is the patient's preferred pharmacy:  [facility] [address] ([address]), [place] - [address] [place] ([place]) [place] [address] Phone: [phone] Fax: [phone]  Is this the correct pharmacy for this prescription? Yes  Has the prescription been filled recently? Yes  Is the patient out of the medication? Yes  Has the patient been seen for an appointment in the last year OR does the patient have an upcoming appointment? Yes  Can we respond through MyChart? No  Agent: Please be advised that Rx refills may take up to 3 business days. We ask that you follow-up with your pharmacy.

## 2024-01-27 ENCOUNTER — Other Ambulatory Visit (INDEPENDENT_AMBULATORY_CARE_PROVIDER_SITE_OTHER): Payer: Self-pay | Admitting: *Deleted

## 2024-01-27 DIAGNOSIS — I639 Cerebral infarction, unspecified: Secondary | ICD-10-CM

## 2024-01-27 MED ORDER — LOSARTAN POTASSIUM 50 MG PO TABS: 50.0000 mg | ORAL_TABLET | Freq: Every day | ORAL | 0 refills | Status: DC

## 2024-01-27 MED ORDER — ATORVASTATIN CALCIUM 80 MG PO TABS: 80.0000 mg | ORAL_TABLET | Freq: Every day | ORAL | 0 refills | Status: DC

## 2024-01-27 MED ORDER — ATORVASTATIN CALCIUM 80 MG PO TABS
80.0000 mg | ORAL_TABLET | Freq: Every day | ORAL | 0 refills | Status: AC
Start: 2024-01-27 — End: ?

## 2024-01-27 NOTE — Telephone Encounter (Signed)
 Requested medication (s) are due for refill today: yes  Requested medication (s) are on the active medication list: yes  Last refill:  atorvastatin  10/29/23 #90/0, losartan  10/18/23 #10/18/23 #90/0  Future visit scheduled: no  Notes to clinic:  Unable to refill per protocol due to failed labs, no updated results.      Requested Prescriptions  Pending Prescriptions Disp Refills   atorvastatin  (LIPITOR ) 80 MG tablet 90 tablet 0    Sig: Take 1 tablet (80 mg total) by mouth daily.     Cardiovascular:  Antilipid - Statins Failed - 01/27/2024 10:24 AM      Failed - Lipid Panel in normal range within the last 12 months    Cholesterol, Total  Date Value Ref Range Status  07/30/2022 208 (H) 100 - 199 mg/dL Final   Cholesterol  Date Value Ref Range Status  02/01/2023 220 (H) 0 - 200 mg/dL Final   LDL Chol Calc (NIH)  Date Value Ref Range Status  07/30/2022 133 (H) 0 - 99 mg/dL Final   LDL Cholesterol  Date Value Ref Range Status  02/01/2023 153 (H) 0 - 99 mg/dL Final    Comment:           Total Cholesterol/HDL:CHD Risk Coronary Heart Disease Risk Table                     Men   Women  1/2 Average Risk   3.4   3.3  Average Risk       5.0   4.4  2 X Average Risk   9.6   7.1  3 X Average Risk  23.4   11.0        Use the calculated Patient Ratio above and the CHD Risk Table to determine the patient's CHD Risk.        ATP III CLASSIFICATION (LDL):  <100     mg/dL   Optimal  161-096  mg/dL   Near or Above                    Optimal  130-159  mg/dL   Borderline  045-409  mg/dL   High  >811     mg/dL   Very High Performed at [facility], [address] [place], [place] [address]    HDL  Date Value Ref Range Status  02/01/2023 49 >40 mg/dL Final  29/56/2130 57 >86 mg/dL Final   Triglycerides  Date Value Ref Range Status  02/01/2023 90 <150 mg/dL Final         Passed - Patient is not pregnant      Passed - Valid encounter within last 12 months    Recent  Outpatient Visits           6 months ago Hemiparesis affecting left side as late effect of stroke (HCC)   [place] [facility] Marius Siemens, NP   7 months ago Acute CVA (cerebrovascular accident) [facility])   [place] [facility] Marius Siemens, NP   11 months ago Hospital discharge follow-up   [place] [facility] Marius Siemens, NP   1 year ago Depression with anxiety   [place] [facility] Marius Siemens, NP   1 year ago Cervical cancer screening   [place] [facility] Marius Siemens, NP               losartan  (COZAAR ) 50 MG  tablet 90 tablet 0    Sig: Take 1 tablet (50 mg total) by mouth once daily.     Cardiovascular:  Angiotensin Receptor Blockers Failed - 01/27/2024 10:24 AM      Failed - Cr in normal range and within 180 days    Creatinine, Ser  Date Value Ref Range Status  03/13/2023 1.44 (H) 0.44 - 1.00 mg/dL Final         Failed - K in normal range and within 180 days    Potassium  Date Value Ref Range Status  03/13/2023 3.2 (L) 3.5 - 5.1 mmol/L Final         Failed - Valid encounter within last 6 months    Recent Outpatient Visits           6 months ago Hemiparesis affecting left side as late effect of stroke (HCC)   [place] [facility] Marius Siemens, NP   7 months ago Acute CVA (cerebrovascular accident) [facility])   [place] [facility] Marius Siemens, NP   11 months ago Hospital discharge follow-up   [place] [facility] Marius Siemens, NP   1 year ago Depression with anxiety   [place] [facility] Marius Siemens, NP   1 year ago Cervical cancer screening   [place] [facility] Marius Siemens, NP              Passed - Patient is not pregnant      Passed - Last BP in normal range    BP Readings from Last 1  Encounters:  08/05/23 137/78

## 2024-04-15 ENCOUNTER — Telehealth: Payer: Self-pay

## 2024-04-15 NOTE — Telephone Encounter (Signed)
 Copied from CRM [id number]. Topic: Clinical - Medical Advice >> Apr 15, 2024  9:22 AM Everette C wrote: Reason for CRM: The patient would like to be contacted by a member of practice staff to notify them of the test/exam that they were previously instructed to go get. They've been unable to recall the test they were instructed to get.   Please contact further when possible

## 2024-04-21 NOTE — Telephone Encounter (Unsigned)
 Copied from CRM [id number]. Topic: General - Other >> Apr 21, 2024 12:55 PM Myrick T wrote: Reason for CRM: patient called stated she need the provider to contact her because she needs a form saying she is able to get her check in her name.

## 2024-04-22 ENCOUNTER — Telehealth (INDEPENDENT_AMBULATORY_CARE_PROVIDER_SITE_OTHER): Payer: Self-pay | Admitting: Primary Care

## 2024-04-22 NOTE — Telephone Encounter (Signed)
 2nd attempt to call pt and see how we can assist her. Pt said she wanted to speak directly to provider and that she did not want to talk to anyone else. I told pt I would be sending a message to provider because she is in the middle of clinic at the moment. I just wanted to see how I could help. The patient started to use very foul language and was extremely disrespectful in the previous conversations we have had. Pt used every curse word she could speaking to me and pt said that she wanted to be removed from [facility] and she no longer wanted her as her provider. Please advise.

## 2024-04-24 ENCOUNTER — Other Ambulatory Visit (INDEPENDENT_AMBULATORY_CARE_PROVIDER_SITE_OTHER): Payer: Self-pay | Admitting: Primary Care

## 2024-04-24 ENCOUNTER — Telehealth: Payer: Self-pay

## 2024-04-24 DIAGNOSIS — I639 Cerebral infarction, unspecified: Secondary | ICD-10-CM

## 2024-04-24 NOTE — Telephone Encounter (Signed)
 Patient has been advised several times she needs to have an office visit to get medication refills.

## 2024-04-24 NOTE — Telephone Encounter (Signed)
 Copied from CRM [id number]. Topic: Clinical - Medication Refill >> Apr 24, 2024  3:34 PM Shardie S wrote: Medication: losartan  (COZAAR ) 50 MG tablet  Has the patient contacted their pharmacy? Yes (Agent: If no, request that the patient contact the pharmacy for the refill. If patient does not wish to contact the pharmacy document the reason why and proceed with request.) (Agent: If yes, when and what did the pharmacy advise?)  This is the patient's preferred pharmacy:  [facility] [address] ([address] [place] - [address] [place] ([place]) [place] [address] Phone: [phone] Fax: [phone]      Is this the correct pharmacy for this prescription? Yes If no, delete pharmacy and type the correct one.   Has the prescription been filled recently? No  Is the patient out of the medication? Yes  Has the patient been seen for an appointment in the last year OR does the patient have an upcoming appointment? No  Can we respond through MyChart? No  Agent: Please be advised that Rx refills may take up to 3 business days. We ask that you follow-up with your pharmacy.

## 2024-04-24 NOTE — Telephone Encounter (Signed)
 Copied from CRM [id number]. Topic: Clinical - Medication Question >> Apr 24, 2024  1:33 PM Rosaria E wrote: Reason for CRM: Pt called requesting to refill her Blood pressure medication but she does not know the name of it    ----------------------------------------------------------------------- From previous Reason for Contact - Medication Refill: Medication:   Has the patient contacted their pharmacy?   (Agent: If no, request that the patient contact the pharmacy for the refill. If patient does not wish to contact the pharmacy document the reason why and proceed with request.) (Agent: If yes, when and what did the pharmacy advise?)  This is the patient's preferred pharmacy:  [facility] [address] ([address]), [place] - [address] [place] ([place]) [place] [address] Phone: [phone] Fax: [phone]  [facility] [address] [place] [address] Phone: [phone] Fax: [phone]  [place] [facility] [address] [place] [address] Phone: [phone] Fax: [phone]  [facility] [address] [place] [address] Phone: [phone] Fax: [phone]  [facility] - [place], [place] - [address] [place] [address] [place] [address] Phone: [phone] Fax: [phone]  Is this the correct pharmacy for this prescription?   If no, delete pharmacy and type the correct one.   Has the prescription been filled recently?    Is the patient out of the medication?    Has the patient been seen for an appointment in the last year OR does the patient have an upcoming appointment?    Can we respond through MyChart?    Agent: Please be advised that Rx refills may take up to 3 business days. We ask that you follow-up with your pharmacy.

## 2024-04-27 NOTE — Telephone Encounter (Signed)
 Requested medication (s) are due for refill today - yes  Requested medication (s) are on the active medication list -yes  Future visit scheduled -yes  Last refill: 01/27/24 #30  Notes to clinic: last refill has notes- needs OV for additional RF- sent for review   Requested Prescriptions  Pending Prescriptions Disp Refills   losartan  (COZAAR ) 50 MG tablet 30 tablet 0    Sig: Take 1 tablet (50 mg total) by mouth once daily.     Cardiovascular:  Angiotensin Receptor Blockers Failed - 04/27/2024  6:33 AM      Failed - Cr in normal range and within 180 days    Creatinine, Ser  Date Value Ref Range Status  03/13/2023 1.44 (H) 0.44 - 1.00 mg/dL Final         Failed - K in normal range and within 180 days    Potassium  Date Value Ref Range Status  03/13/2023 3.2 (L) 3.5 - 5.1 mmol/L Final         Failed - Valid encounter within last 6 months    Recent Outpatient Visits           9 months ago Hemiparesis affecting left side as late effect of stroke (HCC)   [place] [facility] Celestia Rosaline SQUIBB, NP   10 months ago Acute CVA (cerebrovascular accident) [facility])   [place] [facility] Celestia Rosaline SQUIBB, NP   1 year ago Hospital discharge follow-up   [place] [facility] Celestia Rosaline SQUIBB, NP   1 year ago Depression with anxiety   [place] [facility] Celestia Rosaline SQUIBB, NP   1 year ago Cervical cancer screening   [place] [facility] Celestia Rosaline SQUIBB, NP              Passed - Patient is not pregnant      Passed - Last BP in normal range    BP Readings from Last 1 Encounters:  08/05/23 137/78            Requested Prescriptions  Pending Prescriptions Disp Refills   losartan  (COZAAR ) 50 MG tablet 30 tablet 0    Sig: Take 1 tablet (50 mg total) by mouth once daily.     Cardiovascular:  Angiotensin Receptor Blockers Failed - 04/27/2024  6:33 AM      Failed - Cr in normal  range and within 180 days    Creatinine, Ser  Date Value Ref Range Status  03/13/2023 1.44 (H) 0.44 - 1.00 mg/dL Final         Failed - K in normal range and within 180 days    Potassium  Date Value Ref Range Status  03/13/2023 3.2 (L) 3.5 - 5.1 mmol/L Final         Failed - Valid encounter within last 6 months    Recent Outpatient Visits           9 months ago Hemiparesis affecting left side as late effect of stroke (HCC)   [place] [facility] Celestia Rosaline SQUIBB, NP   10 months ago Acute CVA (cerebrovascular accident) [facility])   [place] [facility] Celestia Rosaline SQUIBB, NP   1 year ago Hospital discharge follow-up   [place] [facility] Celestia Rosaline SQUIBB, NP   1 year ago Depression with anxiety    [facility] Celestia Rosaline SQUIBB, NP   1 year ago Cervical cancer screening   [facility] Celestia Rosaline SQUIBB, NP              Passed - Patient is not pregnant      Passed - Last BP in normal range    BP Readings from Last 1 Encounters:  08/05/23 137/78

## 2024-04-29 NOTE — Telephone Encounter (Unsigned)
 Copied from CRM [id number]. Topic: Clinical - Medication Question >> Apr 29, 2024 10:01 AM Dedra B wrote: Reason for CRM: Pt called to follow up on her Losartan  refill request. Informed pt that refills can take up to 3 business days. Pt said she is out.

## 2024-05-01 NOTE — Telephone Encounter (Signed)
 Patient has appointment on 05/19/24 can patient have refill to last until appointment.

## 2024-05-15 ENCOUNTER — Telehealth (INDEPENDENT_AMBULATORY_CARE_PROVIDER_SITE_OTHER): Payer: Self-pay | Admitting: Primary Care

## 2024-05-15 NOTE — Telephone Encounter (Signed)
 1st attempt contacted pt to resch appt left vm to call back and resch appt

## 2024-05-15 NOTE — Telephone Encounter (Signed)
 2nd attempt contacted pt resch appt

## 2024-05-19 ENCOUNTER — Ambulatory Visit (INDEPENDENT_AMBULATORY_CARE_PROVIDER_SITE_OTHER): Payer: MEDICAID | Admitting: Primary Care

## 2024-05-26 ENCOUNTER — Other Ambulatory Visit (INDEPENDENT_AMBULATORY_CARE_PROVIDER_SITE_OTHER): Payer: Self-pay | Admitting: Primary Care

## 2024-05-26 DIAGNOSIS — I639 Cerebral infarction, unspecified: Secondary | ICD-10-CM

## 2024-05-28 NOTE — Telephone Encounter (Signed)
 Requested Prescriptions  Pending Prescriptions Disp Refills   losartan  (COZAAR ) 50 MG tablet [Pharmacy Med Name: Losartan  Potassium 50 MG Oral Tablet] 90 tablet 0    Sig: Take 1 tablet by mouth once daily     Cardiovascular:  Angiotensin Receptor Blockers Failed - 05/28/2024 10:56 AM      Failed - Cr in normal range and within 180 days    Creatinine, Ser  Date Value Ref Range Status  03/13/2023 1.44 (H) 0.44 - 1.00 mg/dL Final         Failed - K in normal range and within 180 days    Potassium  Date Value Ref Range Status  03/13/2023 3.2 (L) 3.5 - 5.1 mmol/L Final         Failed - Valid encounter within last 6 months    Recent Outpatient Visits           10 months ago Hemiparesis affecting left side as late effect of stroke (HCC)   [place] [facility] Celestia Rosaline SQUIBB, NP   11 months ago Acute CVA (cerebrovascular accident) [facility])   [place] [facility] Celestia Rosaline SQUIBB, NP   1 year ago Hospital discharge follow-up   [place] [facility] Celestia Rosaline SQUIBB, NP   1 year ago Depression with anxiety   [place] [facility] Celestia Rosaline SQUIBB, NP   1 year ago Cervical cancer screening   [place] [facility] Celestia Rosaline SQUIBB, NP              Passed - Patient is not pregnant      Passed - Last BP in normal range    BP Readings from Last 1 Encounters:  08/05/23 137/78

## 2024-06-11 ENCOUNTER — Ambulatory Visit (INDEPENDENT_AMBULATORY_CARE_PROVIDER_SITE_OTHER): Payer: MEDICAID | Admitting: Primary Care

## 2024-10-12 ENCOUNTER — Ambulatory Visit: Payer: Self-pay | Admitting: Primary Care
